# Patient Record
Sex: Female | Born: 1935
Health system: Southern US, Community
[De-identification: ages and names within clinical notes are randomized; demographics above are authoritative.]

## PROBLEM LIST (undated history)

## (undated) DIAGNOSIS — K219 Gastro-esophageal reflux disease without esophagitis: Secondary | ICD-10-CM

## (undated) DIAGNOSIS — I2699 Other pulmonary embolism without acute cor pulmonale: Secondary | ICD-10-CM

## (undated) DIAGNOSIS — F4322 Adjustment disorder with anxiety: Secondary | ICD-10-CM

## (undated) HISTORY — PX: ABDOMINAL HYSTERECTOMY: SHX81

## (undated) HISTORY — PX: APPENDECTOMY: SHX54

---

## 2003-12-23 DIAGNOSIS — I2699 Other pulmonary embolism without acute cor pulmonale: Secondary | ICD-10-CM

## 2003-12-23 HISTORY — DX: Other pulmonary embolism without acute cor pulmonale: I26.99

## 2004-06-30 ENCOUNTER — Inpatient Hospital Stay (HOSPITAL_COMMUNITY): Admission: EM | Admit: 2004-06-30 | Discharge: 2004-07-02 | Payer: Self-pay | Admitting: Emergency Medicine

## 2006-01-26 ENCOUNTER — Ambulatory Visit (HOSPITAL_COMMUNITY): Admission: RE | Admit: 2006-01-26 | Discharge: 2006-01-26 | Payer: Self-pay | Admitting: Family Medicine

## 2007-04-01 ENCOUNTER — Ambulatory Visit (HOSPITAL_COMMUNITY): Admission: RE | Admit: 2007-04-01 | Discharge: 2007-04-01 | Payer: Self-pay

## 2011-01-11 ENCOUNTER — Encounter: Payer: Self-pay | Admitting: Family Medicine

## 2013-01-05 ENCOUNTER — Inpatient Hospital Stay (HOSPITAL_COMMUNITY)
Admission: EM | Admit: 2013-01-05 | Discharge: 2013-01-08 | DRG: 176 | Disposition: A | Discharge status: Home / Self Care | Payer: Medicare Other | Attending: Internal Medicine | Admitting: Internal Medicine

## 2013-01-05 ENCOUNTER — Other Ambulatory Visit: Payer: Self-pay

## 2013-01-05 ENCOUNTER — Encounter (HOSPITAL_COMMUNITY): Payer: Self-pay | Admitting: Emergency Medicine

## 2013-01-05 ENCOUNTER — Ambulatory Visit
Admission: RE | Admit: 2013-01-05 | Discharge: 2013-01-05 | Disposition: A | Discharge status: Home / Self Care | Payer: Medicare Other | Source: Ambulatory Visit

## 2013-01-05 DIAGNOSIS — Z7901 Long term (current) use of anticoagulants: Secondary | ICD-10-CM

## 2013-01-05 DIAGNOSIS — I2692 Saddle embolus of pulmonary artery without acute cor pulmonale: Principal | ICD-10-CM | POA: Diagnosis present

## 2013-01-05 DIAGNOSIS — I2699 Other pulmonary embolism without acute cor pulmonale: Secondary | ICD-10-CM | POA: Diagnosis present

## 2013-01-05 DIAGNOSIS — K219 Gastro-esophageal reflux disease without esophagitis: Secondary | ICD-10-CM | POA: Diagnosis present

## 2013-01-05 DIAGNOSIS — Z882 Allergy status to sulfonamides status: Secondary | ICD-10-CM | POA: Diagnosis not present

## 2013-01-05 DIAGNOSIS — R6889 Other general symptoms and signs: Secondary | ICD-10-CM | POA: Diagnosis not present

## 2013-01-05 DIAGNOSIS — R0602 Shortness of breath: Secondary | ICD-10-CM | POA: Diagnosis not present

## 2013-01-05 DIAGNOSIS — I82409 Acute embolism and thrombosis of unspecified deep veins of unspecified lower extremity: Secondary | ICD-10-CM | POA: Diagnosis present

## 2013-01-05 DIAGNOSIS — R0902 Hypoxemia: Secondary | ICD-10-CM | POA: Diagnosis not present

## 2013-01-05 DIAGNOSIS — Z79899 Other long term (current) drug therapy: Secondary | ICD-10-CM

## 2013-01-05 DIAGNOSIS — Z86711 Personal history of pulmonary embolism: Secondary | ICD-10-CM

## 2013-01-05 DIAGNOSIS — IMO0002 Reserved for concepts with insufficient information to code with codable children: Secondary | ICD-10-CM

## 2013-01-05 DIAGNOSIS — I7401 Saddle embolus of abdominal aorta: Secondary | ICD-10-CM | POA: Diagnosis not present

## 2013-01-05 DIAGNOSIS — I749 Embolism and thrombosis of unspecified artery: Secondary | ICD-10-CM | POA: Diagnosis not present

## 2013-01-05 HISTORY — DX: Gastro-esophageal reflux disease without esophagitis: K21.9

## 2013-01-05 HISTORY — DX: Other pulmonary embolism without acute cor pulmonale: I26.99

## 2013-01-05 LAB — CBC WITH DIFFERENTIAL/PLATELET
Basophils Absolute: 0.1 10*3/uL (ref 0.0–0.1)
Basophils Relative: 1 % (ref 0–1)
Eosinophils Absolute: 0.1 10*3/uL (ref 0.0–0.7)
Eosinophils Relative: 1 % (ref 0–5)
HCT: 42.7 % (ref 36.0–46.0)
MCHC: 34.2 g/dL (ref 30.0–36.0)
MCV: 96.4 fL (ref 78.0–100.0)
Monocytes Absolute: 0.7 10*3/uL (ref 0.1–1.0)
Neutro Abs: 4.6 10*3/uL (ref 1.7–7.7)
RDW: 14.1 % (ref 11.5–15.5)

## 2013-01-05 LAB — BASIC METABOLIC PANEL
BUN: 13 mg/dL (ref 6–23)
Chloride: 102 mEq/L (ref 96–112)
Creatinine, Ser: 0.92 mg/dL (ref 0.50–1.10)
GFR calc Af Amer: 68 mL/min — ABNORMAL LOW (ref >90)

## 2013-01-05 MED ORDER — ONDANSETRON HCL 4 MG/2ML IJ SOLN: 4.0000 mg | Freq: Four times a day (QID) | INTRAMUSCULAR | Status: DC | PRN

## 2013-01-05 MED ORDER — IOHEXOL 300 MG/ML  SOLN
125.0000 mL | Freq: Once | INTRAMUSCULAR | Status: AC | PRN
  Administered 2013-01-05: 125 mL via INTRAVENOUS

## 2013-01-05 MED ORDER — HEPARIN (PORCINE) IN NACL 100-0.45 UNIT/ML-% IJ SOLN
1300.0000 [IU]/h | INTRAMUSCULAR | Status: DC
  Administered 2013-01-05: 1300 [IU]/h via INTRAVENOUS
  Filled 2013-01-05 (×2): qty 250

## 2013-01-05 MED ORDER — HEPARIN BOLUS VIA INFUSION
5000.0000 [IU] | Freq: Once | INTRAVENOUS | Status: AC
  Administered 2013-01-05: 5000 [IU] via INTRAVENOUS

## 2013-01-05 MED ORDER — ONDANSETRON HCL 4 MG PO TABS: 4.0000 mg | ORAL_TABLET | Freq: Four times a day (QID) | ORAL | Status: DC | PRN

## 2013-01-05 MED ORDER — SODIUM CHLORIDE 0.9 % IV SOLN
INTRAVENOUS | Status: AC
  Administered 2013-01-05 – 2013-01-06 (×2): via INTRAVENOUS

## 2013-01-05 MED ORDER — ACETAMINOPHEN 650 MG RE SUPP: 650.0000 mg | Freq: Four times a day (QID) | RECTAL | Status: DC | PRN

## 2013-01-05 MED ORDER — ACETAMINOPHEN 325 MG PO TABS: 650.0000 mg | ORAL_TABLET | Freq: Four times a day (QID) | ORAL | Status: DC | PRN

## 2013-01-05 MED ORDER — PANTOPRAZOLE SODIUM 40 MG PO TBEC
40.0000 mg | DELAYED_RELEASE_TABLET | Freq: Every day | ORAL | Status: DC
  Administered 2013-01-06 – 2013-01-08 (×3): 40 mg via ORAL
  Filled 2013-01-05 (×3): qty 1

## 2013-01-05 MED ORDER — WHITE PETROLATUM GEL
Status: AC
  Administered 2013-01-05
  Filled 2013-01-05: qty 5

## 2013-01-05 MED ORDER — SODIUM CHLORIDE 0.9 % IJ SOLN
3.0000 mL | Freq: Two times a day (BID) | INTRAMUSCULAR | Status: DC
  Administered 2013-01-07: 3 mL via INTRAVENOUS

## 2013-01-05 NOTE — ED Provider Notes (Signed)
History     CSN: [id number]  Arrival date & time 01/05/13  1730   First MD Initiated Contact with Patient 01/05/13 1744      Chief Complaint  Patient presents with  . Shortness of Breath    Patient is a 79 y.o. female presenting with shortness of breath. The history is provided by the patient.  Shortness of Breath  The current episode started 2 days ago. The onset was sudden. The problem occurs continuously. The problem has been unchanged. The problem is moderate. The symptoms are relieved by rest. The symptoms are aggravated by activity. Associated symptoms include shortness of breath. Pertinent negatives include no chest pain and no fever.  pt reports developing SOB over 2 days ago while walking the dog No significant CP No syncope No vomiting/diarrhea No abdominal pain She reports h/o PE in 2005 that required coumadin for 6 months but then resolved No recent travel.   Past Medical History  Diagnosis Date  . GERD (gastroesophageal reflux disease)   . PE (pulmonary embolism) 2005    Past Surgical History  Procedure Date  . Appendectomy   . Abdominal hysterectomy     No family history on file.  History  Substance Use Topics  . Smoking status: Never Smoker   . Smokeless tobacco: Not on file  . Alcohol Use:      Comment: occasionally    OB History    Grav Para Term Preterm Abortions TAB SAB Ect Mult Living                  Review of Systems  Constitutional: Negative for fever.  Respiratory: Positive for shortness of breath.   Cardiovascular: Negative for chest pain.  Gastrointestinal: Negative for vomiting and blood in stool.  Neurological: Negative for weakness.  Psychiatric/Behavioral: Negative for agitation.  All other systems reviewed and are negative.    Allergies  Sulfa antibiotics  Home Medications   Current Outpatient Rx  Name  Route  Sig  Dispense  Refill  . OMEPRAZOLE 20 MG PO CPDR   Oral   Take 20 mg by mouth daily.           BP  144/74  Pulse 110  Temp 97.8 F (36.6 C) (Oral)  Resp 21  Ht 5\' 8"  (1.727 m)  Wt 210 lb (95.255 kg)  BMI 31.93 kg/m2  SpO2 97%  Physical Exam CONSTITUTIONAL: Well developed/well nourished HEAD AND FACE: Normocephalic/atraumatic EYES: EOMI/PERRL ENMT: Mucous membranes moist NECK: supple no meningeal signs CV: S1/S2 noted, no murmurs/rubs/gallops noted LUNGS: Lungs are clear to auscultation bilaterally, no apparent distress ABDOMEN: soft, nontender, no rebound or guarding GU:no cva tenderness NEURO: Pt is awake/alert, moves all extremitiesx4. Pt is watching TV.   EXTREMITIES: pulses normal, full ROM SKIN: warm, color normal PSYCH: no abnormalities of mood noted  ED Course  Procedures   Labs Reviewed  BASIC METABOLIC PANEL - Abnormal; Notable for the following:    Glucose, Bld 136 (*)     GFR calc non Af Amer 59 (*)     GFR calc Af Amer 68 (*)     All other components within normal limits  CBC WITH DIFFERENTIAL  PROTIME-INR  APTT  CBC  HEPARIN LEVEL (UNFRACTIONATED)   Ct Angio Chest Pe W/cm &/or Wo Cm  01/05/2013  **ADDENDUM** CREATED: 01/05/2013 16:56:32  Dr. Vincente Liberty office was contacted and the patient was sent via ambulance to [facility] Emergency Department.  **END ADDENDUM** SIGNED BY: [name]. [name], M.D.  01/05/2013  *RADIOLOGY REPORT*  Clinical Data: Recent onset of shortness of breath and hypoxia, past history of pulmonary emboli  CT ANGIOGRAPHY CHEST  Technique:  Multidetector CT imaging of the chest using the standard protocol during bolus administration of intravenous contrast. Multiplanar reconstructed images including MIPs were obtained and reviewed to evaluate the vascular anatomy.  Contrast: OMNIPAQUE IOHEXOL 300 MG/ML  SOLN  Comparison: CT angio chest of 06/30/2004  BUN and creatinine were obtained on site at [facility] at [address] Results:  BUN 12 mg/dL,  Creatinine 1.1 mg/dL.  Findings: On soft tissue window images, there is a  sizable saddle embolus across the right and left main pulmonary arteries.  There is a large partially occlusive embolus within the right pulmonary artery to the right lower lobe with some involvement of branches to the right middle lobe as well.  On the left there is a smaller partially occlusive embolus to the left lower lobe.  Upper lobe branches are involved on the right with small emboli but no emboli are seen involving the left upper lobe pulmonary artery branches.  The thoracic aorta opacifies with no acute abnormality and the origins of the great vessels are patent.  No mediastinal or hilar adenopathy is seen.  A small hiatal hernia is noted.  On the lung window images, no foci of pulmonary infarction are evident.  Somewhat prominent interstitial markings are present at both lung bases.  No pleural effusion is noted.  No bony abnormality is noted.  IMPRESSION:  1.  Large pulmonary emboli involving both main pulmonary arteries with a saddle embolus.  Partially occlusive emboli noted to both lower lobes right greater than left. 2.  No evidence of pulmonary infarction, with somewhat prominent interstitial markings at the lung bases. 3.  Small hiatal hernia.  This report will be called to the physician by the CT technologist performing this study.   Original Report Authenticated By: Dwyane Dee, M.D.    7:47 PM Pt with PE.  She has saddle emboli, but is stable at this time and not hypoxic.  I did speak to critical care (Dr Herma Carson) and recommend heparin and stepdown care 7:53 PM D/w dr Toniann Fail, will admit to stepdown   MDM  Nursing notes including past medical history and social history reviewed and considered in documentation Labs/vital reviewed and considered        Date: 01/05/2013  Rate: 110  Rhythm: normal sinus rhythm  QRS Axis: normal  Intervals: normal  ST/T Wave abnormalities: nonspecific ST changes  Conduction Disutrbances:none    Joya Gaskins, MD 01/05/13 [phone]

## 2013-01-05 NOTE — ED Notes (Signed)
Pt given [place] sandwich meal and coffee.  Awaiting bed placement.

## 2013-01-05 NOTE — ED Notes (Signed)
Had sudden onset of shortness of breath started Monday when walking dog, transferred here from [facility] with dx Pulmonary Embolisms. States has not been as active as normal, due to weather

## 2013-01-05 NOTE — H&P (Signed)
Stacy Valencia is an 79 y.o. female. Patient was seen and examined on January 05, 2013. PCP - Dr. Kateri Plummer.   Chief Complaint: Shortness of breath. HPI: 79 year-old female with previous history of pulmonary embolism in 2005 after patient had travel to [place] on a flight was referred to the ER by patient's PCP because of patient's complaints of shortness of breath. Patient noticed shortness of breath 2 days ago when she was walking her dog. Her shortness of breath has actually improved over the last 2 days but due to concerns with this symptom she had gone to the PCP who had referred to the ER. In the ER patient was found to have elevated d-dimer and CT angiogram of the chest showed bilateral pulmonary embolism with a saddle embolus. Patient was hemodynamically stable. Critical care was consulted by the ER physician and at this time since patient was hemodynamically stable will be managed in step down unit. Patient denies having had any recent surgery and has not taken any hormone pills. Last month she did travel to [place] in a car.  Past Medical History  Diagnosis Date  . GERD (gastroesophageal reflux disease)   . PE (pulmonary embolism) 2005    Past Surgical History  Procedure Date  . Appendectomy   . Abdominal hysterectomy     Family History  Problem Relation Age of Onset  . Pulmonary embolism Neg Hx    Social History:  reports that she has never smoked. She does not have any smokeless tobacco history on file. She reports that she does not drink alcohol or use illicit drugs.  Allergies:  Allergies  Allergen Reactions  . Sulfa Antibiotics Nausea And Vomiting     (Not in a hospital admission)  Results for orders placed during the hospital encounter of 01/05/13 (from the past 48 hour(s))  BASIC METABOLIC PANEL     Status: Abnormal   Collection Time   01/05/13  6:03 PM      Component Value Range Comment   Sodium 139  135 - 145 mEq/L    Potassium 3.9  3.5 - 5.1 mEq/L    Chloride  102  96 - 112 mEq/L    CO2 24  19 - 32 mEq/L    Glucose, Bld 136 (*) 70 - 99 mg/dL    BUN 13  6 - 23 mg/dL    Creatinine, Ser 9.60  0.50 - 1.10 mg/dL    Calcium 9.9  8.4 - 45.4 mg/dL    GFR calc non Af Amer 59 (*) >90 mL/min    GFR calc Af Amer 68 (*) >90 mL/min   CBC WITH DIFFERENTIAL     Status: Normal   Collection Time   01/05/13  6:03 PM      Component Value Range Comment   WBC 8.9  4.0 - 10.5 K/uL    RBC 4.43  3.87 - 5.11 MIL/uL    Hemoglobin 14.6  12.0 - 15.0 g/dL    HCT 09.8  11.9 - 14.7 %    MCV 96.4  78.0 - 100.0 fL    MCH 33.0  26.0 - 34.0 pg    MCHC 34.2  30.0 - 36.0 g/dL    RDW 82.9  56.2 - 13.0 %    Platelets 184  150 - 400 K/uL    Neutrophils Relative 52  43 - 77 %    Neutro Abs 4.6  1.7 - 7.7 K/uL    Lymphocytes Relative 39  12 - 46 %  Lymphs Abs 3.4  0.7 - 4.0 K/uL    Monocytes Relative 8  3 - 12 %    Monocytes Absolute 0.7  0.1 - 1.0 K/uL    Eosinophils Relative 1  0 - 5 %    Eosinophils Absolute 0.1  0.0 - 0.7 K/uL    Basophils Relative 1  0 - 1 %    Basophils Absolute 0.1  0.0 - 0.1 K/uL   PROTIME-INR     Status: Normal   Collection Time   01/05/13  6:03 PM      Component Value Range Comment   Prothrombin Time 13.3  11.6 - 15.2 seconds    INR 1.02  0.00 - 1.49   APTT     Status: Normal   Collection Time   01/05/13  6:03 PM      Component Value Range Comment   aPTT 30  24 - 37 seconds    Ct Angio Chest Pe W/cm &/or Wo Cm  01/05/2013  **ADDENDUM** CREATED: 01/05/2013 16:56:32  Dr. Vincente Liberty office was contacted and the patient was sent via ambulance to [facility] Emergency Department.  **END ADDENDUM** SIGNED BY: [name]. [name], M.D.   01/05/2013  *RADIOLOGY REPORT*  Clinical Data: Recent onset of shortness of breath and hypoxia, past history of pulmonary emboli  CT ANGIOGRAPHY CHEST  Technique:  Multidetector CT imaging of the chest using the standard protocol during bolus administration of intravenous contrast. Multiplanar reconstructed images including  MIPs were obtained and reviewed to evaluate the vascular anatomy.  Contrast: OMNIPAQUE IOHEXOL 300 MG/ML  SOLN  Comparison: CT angio chest of 06/30/2004  BUN and creatinine were obtained on site at [facility] at [address] Results:  BUN 12 mg/dL,  Creatinine 1.1 mg/dL.  Findings: On soft tissue window images, there is a sizable saddle embolus across the right and left main pulmonary arteries.  There is a large partially occlusive embolus within the right pulmonary artery to the right lower lobe with some involvement of branches to the right middle lobe as well.  On the left there is a smaller partially occlusive embolus to the left lower lobe.  Upper lobe branches are involved on the right with small emboli but no emboli are seen involving the left upper lobe pulmonary artery branches.  The thoracic aorta opacifies with no acute abnormality and the origins of the great vessels are patent.  No mediastinal or hilar adenopathy is seen.  A small hiatal hernia is noted.  On the lung window images, no foci of pulmonary infarction are evident.  Somewhat prominent interstitial markings are present at both lung bases.  No pleural effusion is noted.  No bony abnormality is noted.  IMPRESSION:  1.  Large pulmonary emboli involving both main pulmonary arteries with a saddle embolus.  Partially occlusive emboli noted to both lower lobes right greater than left. 2.  No evidence of pulmonary infarction, with somewhat prominent interstitial markings at the lung bases. 3.  Small hiatal hernia.  This report will be called to the physician by the CT technologist performing this study.   Original Report Authenticated By: Dwyane Dee, M.D.     Review of Systems  Constitutional: Negative.   HENT: Negative.   Eyes: Negative.   Respiratory: Positive for shortness of breath.   Cardiovascular: Negative.   Gastrointestinal: Negative.   Genitourinary: Negative.   Musculoskeletal: Negative.   Skin: Negative.     Neurological: Negative.   Endo/Heme/Allergies: Negative.   Psychiatric/Behavioral:  Negative.     Blood pressure 144/74, pulse 110, temperature 97.8 F (36.6 C), temperature source Oral, resp. rate 21, height 5\' 8"  (1.727 m), weight 95.255 kg (210 lb), SpO2 97.00%. Physical Exam  Constitutional: She is oriented to person, place, and time. She appears well-developed and well-nourished. No distress.  HENT:  Head: Normocephalic and atraumatic.  Right Ear: External ear normal.  Left Ear: External ear normal.  Nose: Nose normal.  Mouth/Throat: Oropharynx is clear and moist. No oropharyngeal exudate.  Eyes: Conjunctivae normal are normal. Pupils are equal, round, and reactive to light. Right eye exhibits no discharge. Left eye exhibits no discharge. No scleral icterus.  Neck: Normal range of motion. Neck supple.  Cardiovascular: Regular rhythm.        Tachycardia.  Respiratory: Effort normal and breath sounds normal. No respiratory distress. She has no wheezes. She has no rales.  GI: Soft. Bowel sounds are normal. She exhibits no distension. There is no tenderness. There is no rebound.  Musculoskeletal: She exhibits no edema and no tenderness.  Neurological: She is alert and oriented to person, place, and time.       Moves all extremities.  Skin: Skin is warm and dry. She is not diaphoretic.     Assessment/Plan #1. Bilateral pulmonary embolism but hemodynamically stable - patient will be monitored in the step down overnight. Patient has been started on heparin infusion. Start oral anticoagulants in a.m. if hemodynamically stable. Patient eventually will need hematology consult as outpatient. Since this is the second episode of unprovoked PE patient probably will need lifelong oral anticoagulants. Check Doppler of the lower extremity. #2. GERD - continue PPI.  CODE STATUS - full code.  Eduard Clos 01/05/2013, 8:47 PM

## 2013-01-05 NOTE — Progress Notes (Signed)
ANTICOAGULATION CONSULT NOTE - Initial Consult  Pharmacy Consult for Heparin Indication: bilateral pulmonary embolus  Allergies  Allergen Reactions  . Sulfa Antibiotics Nausea And Vomiting    Patient Measurements:  Estimated body weight per patient 95.3kg   Estimated ideal body weight 63.9 kg Estimated Heparin Dosing Weight: 80kg  Vital Signs: Temp: 97.8 F (36.6 C) (01/15 1739) Temp src: Oral (01/15 1739) BP: 144/74 mmHg (01/15 1739) Pulse Rate: 110  (01/15 1739)  Labs: No results found for this basename: HGB:2,HCT:3,PLT:3,APTT:3,LABPROT:3,INR:3,HEPARINUNFRC:3,CREATININE:3,CKTOTAL:3,CKMB:3,TROPONINI:3 in the last 72 hours  CrCl is unknown because no creatinine reading has been taken and the patient has no height on file.  Medical History: Past Medical History  Diagnosis Date  . GERD (gastroesophageal reflux disease)   . PE (pulmonary embolism) 2005    Medications:  No anticoagulation PTA   Assessment: Miss Colville is a 79 year old female to start heparin per pharmacy for new bilateral PE. Her CBC is within normal limits as well as her platelet count at 184.   Goal of Therapy:  Heparin level 0.3-0.7 units/ml Monitor platelets by anticoagulation protocol: Yes   Plan:  Heparin bolus of 5000 units  Then begin heparin drip at 1300 units/hr  Obtain 8 hour HL, timed collect  Daily HL and CBC  Thank you,  Brett Fairy, PharmD, BCPS 01/05/2013 6:52 PM

## 2013-01-05 NOTE — ED Notes (Signed)
Pt brought to ED via [facility] from [facility], pt was seen there for evaluation of shortness of breath she developed over the past weekend, they found pt to have bilateral Pulmonary Embolus and called EMS for transport to ED.  Pt denies any pain or shortness of breath at this time.  Placed on cardiac monitor upon arrival to ED.

## 2013-01-06 ENCOUNTER — Telehealth: Payer: Self-pay | Admitting: Oncology

## 2013-01-06 DIAGNOSIS — I2699 Other pulmonary embolism without acute cor pulmonale: Secondary | ICD-10-CM

## 2013-01-06 DIAGNOSIS — I749 Embolism and thrombosis of unspecified artery: Secondary | ICD-10-CM

## 2013-01-06 LAB — BASIC METABOLIC PANEL
CO2: 24 mEq/L (ref 19–32)
Calcium: 9.4 mg/dL (ref 8.4–10.5)
GFR calc Af Amer: 61 mL/min — ABNORMAL LOW (ref >90)
GFR calc non Af Amer: 53 mL/min — ABNORMAL LOW (ref >90)
Sodium: 139 mEq/L (ref 135–145)

## 2013-01-06 LAB — MRSA PCR SCREENING: MRSA by PCR: NEGATIVE

## 2013-01-06 LAB — CBC
Hemoglobin: 14.1 g/dL (ref 12.0–15.0)
MCH: 33.4 pg (ref 26.0–34.0)
MCHC: 34.3 g/dL (ref 30.0–36.0)
MCV: 97.4 fL (ref 78.0–100.0)
RBC: 4.22 MIL/uL (ref 3.87–5.11)

## 2013-01-06 LAB — HEPARIN LEVEL (UNFRACTIONATED)
Heparin Unfractionated: 1.28 IU/mL — ABNORMAL HIGH (ref 0.30–0.70)
Heparin Unfractionated: 0.66 IU/mL (ref 0.30–0.70)

## 2013-01-06 LAB — TROPONIN I: Troponin I: <0.3 ng/mL (ref <0.30)

## 2013-01-06 MED ORDER — WARFARIN SODIUM 5 MG PO TABS
5.0000 mg | ORAL_TABLET | Freq: Once | ORAL | Status: DC
  Filled 2013-01-06: qty 1

## 2013-01-06 MED ORDER — WARFARIN - PHARMACIST DOSING INPATIENT: Freq: Every day | Status: DC

## 2013-01-06 MED ORDER — HEPARIN (PORCINE) IN NACL 100-0.45 UNIT/ML-% IJ SOLN
1100.0000 [IU]/h | INTRAMUSCULAR | Status: DC
  Administered 2013-01-06: 1100 [IU]/h via INTRAVENOUS
  Filled 2013-01-06 (×2): qty 250

## 2013-01-06 NOTE — Progress Notes (Addendum)
Patient ID: Stacy Valencia  female  WUJ:[id number]    DOB: 03-29-1933    DOA: 01/05/2013  PCP: No primary provider on file.  Addendum: Verified with Dr Burna Forts, continue heparin gtt and will start Xarelto tomorrow. She is approved per [employer] fund[employer].   [name] M.D. [facility] 01/06/2013, 2:43 PM  Pager: [phone]     Assessment/Plan: Principal Problem:  *Pulmonary embolism bilateral with saddle embolism: Second episode. First episode in 2005 after a long flight to [place], patient had taken her drive to [place] during Thanksgiving last year. She is currently hemodynamically stable - Continue heparin drip, I started the patient on Coumadin. - Awaiting case management for insurance approval for xarelto - I discussed in detail with [name] from hematology, agreed with the xarelto 15mg  BID x3 weeks -> 20mg  daily possibly indefinitely or per hematology recommendations outpatient. She will follow up with [name] in 2-3 weeks after the discharge. Per hematology rec's, continue heparin drip for 24-48 hours, start xarelto. Patient can be safely dc'ed if stable and after heparin for 24-48hrs and on xarelto.  - I did recommend the patient for 2-D echocardiogram given massive pulmonary embolism bilateral with saddle embolism to rule out any right heart strain on right heart failure, patient adamantly refused to have the 2-D echocardiogram stating that she feels "her heart is fine". She agreed to have Dopplers for LE's.  Active Problems:  GERD (gastroesophageal reflux disease): Continue PPI  DVT Prophylaxis: On full dose anticoagulation  Code Status: Full code  Disposition: In 1-2 days    Subjective: Patient sitting in the chair, smiling, no chest pain or any worsening shortness of breath. No nausea vomiting or fevers  Objective: Weight change:   Intake/Output Summary (Last 24 hours) at 01/06/13 1252 Last data filed at 01/06/13 0500  Gross per 24 hour  Intake 652.67 ml  Output     400 ml  Net 252.67 ml   Blood pressure 131/73, pulse 95, temperature 98.5 F (36.9 C), temperature source Oral, resp. rate 19, height 5\' 8"  (1.727 m), weight 95.255 kg (210 lb), SpO2 93.00%.  Physical Exam: General: Alert and awake, oriented x3, not in any acute distress. HEENT: anicteric sclera, pupils reactive to light and accommodation, EOMI CVS: S1-S2 clear, no murmur rubs or gallops Chest: clear to auscultation bilaterally, no wheezing, rales or rhonchi Abdomen: soft nontender, nondistended, normal bowel sounds, no organomegaly Extremities: no cyanosis, clubbing or edema noted bilaterally Neuro: Cranial nerves II-XII intact, no focal neurological deficits  Lab Results: Basic Metabolic Panel:  Lab 01/06/13 8657 01/05/13 1803  NA 139 139  K 3.7 3.9  CL 104 102  CO2 24 24  GLUCOSE 116* 136*  BUN 12 13  CREATININE 1.00 0.92  CALCIUM 9.4 9.9  MG -- --  PHOS -- --   CBC:  Lab 01/06/13 0201 01/05/13 1803  WBC 8.9 8.9  NEUTROABS -- 4.6  HGB 14.1 14.6  HCT 41.1 42.7  MCV 97.4 96.4  PLT 174 184   Cardiac Enzymes:  Lab 01/05/13 2351  CKTOTAL --  CKMB --  CKMBINDEX --  TROPONINI <0.30   BNP: No components found with this basename: POCBNP:2 CBG: No results found for this basename: GLUCAP:5 in the last 168 hours   Micro Results: Recent Results (from the past 240 hour(s))  MRSA PCR SCREENING     Status: Normal   Collection Time   01/05/13 11:28 PM      Component Value Range Status Comment   MRSA by PCR NEGATIVE  NEGATIVE Final     Studies/Results: Ct Angio Chest Pe W/cm &/or Wo Cm  01/05/2013  **ADDENDUM** CREATED: 01/05/2013 16:56:32  Dr. Vincente Liberty office was contacted and the patient was sent via ambulance to [facility] Emergency Department.  **END ADDENDUM** SIGNED BY: [name]. [name], M.D.   01/05/2013  *RADIOLOGY REPORT*  Clinical Data: Recent onset of shortness of breath and hypoxia, past history of pulmonary emboli  CT ANGIOGRAPHY CHEST  Technique:   Multidetector CT imaging of the chest using the standard protocol during bolus administration of intravenous contrast. Multiplanar reconstructed images including MIPs were obtained and reviewed to evaluate the vascular anatomy.  Contrast: OMNIPAQUE IOHEXOL 300 MG/ML  SOLN  Comparison: CT angio chest of 06/30/2004  BUN and creatinine were obtained on site at [facility] at [address] Results:  BUN 12 mg/dL,  Creatinine 1.1 mg/dL.  Findings: On soft tissue window images, there is a sizable saddle embolus across the right and left main pulmonary arteries.  There is a large partially occlusive embolus within the right pulmonary artery to the right lower lobe with some involvement of branches to the right middle lobe as well.  On the left there is a smaller partially occlusive embolus to the left lower lobe.  Upper lobe branches are involved on the right with small emboli but no emboli are seen involving the left upper lobe pulmonary artery branches.  The thoracic aorta opacifies with no acute abnormality and the origins of the great vessels are patent.  No mediastinal or hilar adenopathy is seen.  A small hiatal hernia is noted.  On the lung window images, no foci of pulmonary infarction are evident.  Somewhat prominent interstitial markings are present at both lung bases.  No pleural effusion is noted.  No bony abnormality is noted.  IMPRESSION:  1.  Large pulmonary emboli involving both main pulmonary arteries with a saddle embolus.  Partially occlusive emboli noted to both lower lobes right greater than left. 2.  No evidence of pulmonary infarction, with somewhat prominent interstitial markings at the lung bases. 3.  Small hiatal hernia.  This report will be called to the physician by the CT technologist performing this study.   Original Report Authenticated By: Dwyane Dee, M.D.     Medications: Scheduled Meds:   . pantoprazole  40 mg Oral Daily  . sodium chloride  3 mL Intravenous Q12H    . warfarin  5 mg Oral ONCE-1800  . Warfarin - Pharmacist Dosing Inpatient   Does not apply q1800      LOS: 1 day   Roseanne Juenger M.D. [facility] 01/06/2013, 12:52 PM Pager: [phone]  If 7PM-7AM, please contact night-coverage [email or web address] Password [email or web address]

## 2013-01-06 NOTE — Progress Notes (Signed)
Patient refuse to have 2D echo done states"nothing is wrong with my heart" I try to explain the reason it was order however she continue to refuse. Dr. Isidoro Donning notified MD states she also explained this morning to patient.

## 2013-01-06 NOTE — Telephone Encounter (Signed)
C/D 01/06/13 for appt 01/27/13

## 2013-01-06 NOTE — Progress Notes (Signed)
VASCULAR LAB PRELIMINARY  PRELIMINARY  PRELIMINARY  PRELIMINARY  Bilateral lower extremity venous duplex completed.    Preliminary report:  Positive for a DVT in the popliteal vein mid to proximal. No evidence of superficial thrombus. Patient has had multiple veins stripped in the past also with injections. No evidence of a Baker's cyst. Left:  No evidence of DVT, superficial thrombosis, or Baker's cyst.  Taevion Sikora, RVS 01/06/2013, 1:10 PM

## 2013-01-06 NOTE — Progress Notes (Signed)
01/06/2013 1030.Marland KitchenMarland KitchenCM referral noted for medication assistance for Xarelto. Benefits check completed...$24.00 copay at [facility] and [facility]; no authorization required. Carlene, RN aware.  NCM will continue to follow pt for future needs. Oletta Cohn, RN, BSN, [place] [phone].

## 2013-01-06 NOTE — Progress Notes (Signed)
ANTICOAGULATION CONSULT NOTE - Follow Up Consult  Pharmacy Consult for heparin Indication: bilateral PE  Allergies  Allergen Reactions  . Sulfa Antibiotics Nausea And Vomiting    Patient Measurements: Height: 5\' 8"  (172.7 cm) Weight: 210 lb (95.255 kg) IBW/kg (Calculated) : 63.9  Heparin Dosing Weight: 80 kg  Vital Signs: Temp: 97.6 F (36.4 C) (01/16 2000) Temp src: Oral (01/16 2000) BP: 145/79 mmHg (01/16 2000) Pulse Rate: 103  (01/16 2000)  Labs:  Basename 01/06/13 2201 01/06/13 1145 01/06/13 0201 01/05/13 2351 01/05/13 1803  HGB -- -- 14.1 -- 14.6  HCT -- -- 41.1 -- 42.7  PLT -- -- 174 -- 184  APTT -- -- -- -- 30  LABPROT -- -- -- -- 13.3  INR -- -- -- -- 1.02  HEPARINUNFRC 0.66 1.28* 0.66 -- --  CREATININE -- -- 1.00 -- 0.92  CKTOTAL -- -- -- -- --  CKMB -- -- -- -- --  TROPONINI -- -- -- <0.30 --    Estimated Creatinine Clearance: 56.9 ml/min (by C-G formula based on Cr of 1).   Medications:  Scheduled:     . pantoprazole  40 mg Oral Daily  . sodium chloride  3 mL Intravenous Q12H  . [COMPLETED] white petrolatum      . [DISCONTINUED] warfarin  5 mg Oral ONCE-1800  . [DISCONTINUED] Warfarin - Pharmacist Dosing Inpatient   Does not apply q1800   Infusions:     . sodium chloride 100 mL/hr at 01/06/13 0928  . heparin 1,100 Units/hr (01/06/13 2200)  . [DISCONTINUED] heparin 1,300 Units/hr (01/06/13 0500)    Assessment: 79 yo female with bilateral PE is currently on supratherapeutic hepairn.  Heparin level (0.66) is at-goal on 1100 units/hr.  Plan to d/c heparin and start Xarelto tomorrow at ~17:00.   Goal of Therapy:  Heparin level 0.3-0.7 units/ml Monitor platelets by anticoagulation protocol: Yes   Plan:  1) Continue IV heparin at 1100 units/hr 2) Daily CBC, heparin level 3) Follow-up heparin --> Xarelto change.  Emeline Gins 01/06/2013,10:55 PM

## 2013-01-06 NOTE — Telephone Encounter (Signed)
S/W pt in re  NP appt 02/06 @ 10:30 w/[name].  Referring Dr. Isidoro Donning Dx-PE Welcome packet mailed.

## 2013-01-06 NOTE — Progress Notes (Signed)
ANTICOAGULATION CONSULT NOTE Pharmacy Consult for Heparin Indication: bilateral pulmonary embolus  Allergies  Allergen Reactions  . Sulfa Antibiotics Nausea And Vomiting   Vital Signs: Temp: 97.8 F (36.6 C) (01/15 2305) Temp src: Oral (01/15 2305) BP: 116/72 mmHg (01/16 0200) Pulse Rate: 97  (01/16 0300)  Labs:  Basename 01/06/13 0201 01/05/13 2351 01/05/13 1803  HGB 14.1 -- 14.6  HCT 41.1 -- 42.7  PLT 174 -- 184  APTT -- -- 30  LABPROT -- -- 13.3  INR -- -- 1.02  HEPARINUNFRC 0.66 -- --  CREATININE 1.00 -- 0.92  CKTOTAL -- -- --  CKMB -- -- --  TROPONINI -- <0.30 --    Estimated Creatinine Clearance: 56.9 ml/min (by C-G formula based on Cr of 1).  Assessment: 79 year old female with PE for heparin  Goal of Therapy:  Heparin level 0.3-0.7 units/ml Monitor platelets by anticoagulation protocol: Yes   Plan:  Continue Heparin at current rate Recheck level in 8 hr to verify stays within therapeutic range  Eddie Candle   01/06/2013 3:25 AM

## 2013-01-06 NOTE — Progress Notes (Signed)
ANTICOAGULATION CONSULT NOTE - Follow Up Consult  Pharmacy Consult for heparin Indication: bilateral PE  Allergies  Allergen Reactions  . Sulfa Antibiotics Nausea And Vomiting    Patient Measurements: Height: 5\' 8"  (172.7 cm) Weight: 210 lb (95.255 kg) IBW/kg (Calculated) : 63.9  Heparin Dosing Weight: 80 kg  Vital Signs: Temp: 98.5 F (36.9 C) (01/16 0840) Temp src: Oral (01/16 0840) BP: 131/73 mmHg (01/16 0840) Pulse Rate: 95  (01/16 0840)  Labs:  Basename 01/06/13 1145 01/06/13 0201 01/05/13 2351 01/05/13 1803  HGB -- 14.1 -- 14.6  HCT -- 41.1 -- 42.7  PLT -- 174 -- 184  APTT -- -- -- 30  LABPROT -- -- -- 13.3  INR -- -- -- 1.02  HEPARINUNFRC 1.28* 0.66 -- --  CREATININE -- 1.00 -- 0.92  CKTOTAL -- -- -- --  CKMB -- -- -- --  TROPONINI -- -- <0.30 --    Estimated Creatinine Clearance: 56.9 ml/min (by C-G formula based on Cr of 1).   Medications:  Scheduled:    . [COMPLETED] heparin  5,000 Units Intravenous Once  . pantoprazole  40 mg Oral Daily  . sodium chloride  3 mL Intravenous Q12H  . warfarin  5 mg Oral ONCE-1800  . Warfarin - Pharmacist Dosing Inpatient   Does not apply q1800  . [COMPLETED] white petrolatum       Infusions:    . sodium chloride 100 mL/hr at 01/06/13 0928  . heparin 1,300 Units/hr (01/06/13 0500)    Assessment: 79 yo female with bilateral PE is currently on supratherapeutic hepairn.  Heparin level was 1.28. Goal of Therapy:  Heparin level 0.3-0.7 units/ml Monitor platelets by anticoagulation protocol: Yes   Plan:  1) Hold heparin x 1 hr, then reduce heparin to 1100 units/hr 2) Check an 8hr heparin level after rate is changed   Adriauna Campton, Tsz-Yin 01/06/2013,12:39 PM

## 2013-01-06 NOTE — Progress Notes (Deleted)
ANTICOAGULATION CONSULT NOTE - Initial Consult  Pharmacy Consult for coumadin Indication: new PE  Allergies  Allergen Reactions  . Sulfa Antibiotics Nausea And Vomiting    Patient Measurements: Height: 5\' 8"  (172.7 cm) Weight: 210 lb (95.255 kg) IBW/kg (Calculated) : 63.9  Heparin Dosing Weight:   Vital Signs: Temp: 98.6 F (37 C) (01/16 0549) Temp src: Oral (01/16 0549) BP: 122/74 mmHg (01/16 0549) Pulse Rate: 102  (01/16 0549)  Labs:  Basename 01/06/13 0201 01/05/13 2351 01/05/13 1803  HGB 14.1 -- 14.6  HCT 41.1 -- 42.7  PLT 174 -- 184  APTT -- -- 30  LABPROT -- -- 13.3  INR -- -- 1.02  HEPARINUNFRC 0.66 -- --  CREATININE 1.00 -- 0.92  CKTOTAL -- -- --  CKMB -- -- --  TROPONINI -- <0.30 --    Estimated Creatinine Clearance: 56.9 ml/min (by C-G formula based on Cr of 1).   Medical History: Past Medical History  Diagnosis Date  . GERD (gastroesophageal reflux disease)   . PE (pulmonary embolism) 2005    Medications:  Scheduled:    . [COMPLETED] heparin  5,000 Units Intravenous Once  . pantoprazole  40 mg Oral Daily  . sodium chloride  3 mL Intravenous Q12H  . [COMPLETED] white petrolatum       Infusions:    . sodium chloride 100 mL/hr at 01/06/13 0500  . heparin 1,300 Units/hr (01/06/13 0500)    Assessment: 79 yo female with new bilateral PE will be started on coumadin in addition to heparin which she is already on.  Baseline INR was 1.02.  Coumadin score was 4. Day 1 of 5 of VTE overlap.  Goal of Therapy:  INR 2-3     Plan:  1) Coumadin 5mg  po x1 2) Daily PT/INR  Stacy Valencia, Tsz-Yin 01/06/2013,8:46 AM

## 2013-01-06 NOTE — Progress Notes (Signed)
Utilization Review Completed [facility] J. Mumin Denomme, RN, BSN, NCM [phone]  

## 2013-01-06 NOTE — Progress Notes (Signed)
ANTICOAGULATION CONSULT NOTE - Initial Consult  Pharmacy Consult for xarelto Indication: pulmonary embolus  Allergies  Allergen Reactions  . Sulfa Antibiotics Nausea And Vomiting    Patient Measurements: Height: 5\' 8"  (172.7 cm) Weight: 210 lb (95.255 kg) IBW/kg (Calculated) : 63.9  Heparin Dosing Weight:   Vital Signs: Temp: 98.5 F (36.9 C) (01/16 0840) Temp src: Oral (01/16 0840) BP: 131/73 mmHg (01/16 0840) Pulse Rate: 95  (01/16 0840)  Labs:  Basename 01/06/13 1145 01/06/13 0201 01/05/13 2351 01/05/13 1803  HGB -- 14.1 -- 14.6  HCT -- 41.1 -- 42.7  PLT -- 174 -- 184  APTT -- -- -- 30  LABPROT -- -- -- 13.3  INR -- -- -- 1.02  HEPARINUNFRC 1.28* 0.66 -- --  CREATININE -- 1.00 -- 0.92  CKTOTAL -- -- -- --  CKMB -- -- -- --  TROPONINI -- -- <0.30 --    Estimated Creatinine Clearance: 56.9 ml/min (by C-G formula based on Cr of 1).   Medical History: Past Medical History  Diagnosis Date  . GERD (gastroesophageal reflux disease)   . PE (pulmonary embolism) 2005    Medications:  Scheduled:    . [COMPLETED] heparin  5,000 Units Intravenous Once  . pantoprazole  40 mg Oral Daily  . sodium chloride  3 mL Intravenous Q12H  . [COMPLETED] white petrolatum      . [DISCONTINUED] warfarin  5 mg Oral ONCE-1800  . [DISCONTINUED] Warfarin - Pharmacist Dosing Inpatient   Does not apply q1800   Infusions:    . sodium chloride 100 mL/hr at 01/06/13 0928  . heparin    . [DISCONTINUED] heparin 1,300 Units/hr (01/06/13 0500)    Assessment: 55 female with bilateral PE will be continued on heparin until ~1700 on 01/17 and be converted to Xarelto alone.  CrCl ~57 Goal of Therapy:   Monitor platelets by anticoagulation protocol: Yes   Plan:  1) At 1700 tom, d/c heparin and start Xarelto 15mg  po bid x 3 weeks, then 20mg  po qday thereafter (will enter order on 01/17) 2) Monitor renal function and CBC  Ihsan Nomura, Tsz-Yin 01/06/2013,1:56 PM

## 2013-01-07 DIAGNOSIS — I82409 Acute embolism and thrombosis of unspecified deep veins of unspecified lower extremity: Secondary | ICD-10-CM | POA: Diagnosis present

## 2013-01-07 HISTORY — DX: Acute embolism and thrombosis of unspecified deep veins of unspecified lower extremity: I82.409

## 2013-01-07 LAB — CBC
Hemoglobin: 12.7 g/dL (ref 12.0–15.0)
MCHC: 33 g/dL (ref 30.0–36.0)
RBC: 3.95 MIL/uL (ref 3.87–5.11)

## 2013-01-07 LAB — HEPARIN LEVEL (UNFRACTIONATED): Heparin Unfractionated: 0.65 IU/mL (ref 0.30–0.70)

## 2013-01-07 MED ORDER — RIVAROXABAN 15 MG PO TABS
15.0000 mg | ORAL_TABLET | Freq: Two times a day (BID) | ORAL | Status: DC
  Administered 2013-01-07 – 2013-01-08 (×2): 15 mg via ORAL
  Filled 2013-01-07 (×4): qty 1

## 2013-01-07 MED ORDER — RIVAROXABAN 15 MG PO TABS
15.0000 mg | ORAL_TABLET | Freq: Two times a day (BID) | ORAL | Status: DC
  Filled 2013-01-07 (×2): qty 1

## 2013-01-07 MED ORDER — INFLUENZA VIRUS VACC SPLIT PF IM SUSP
0.5000 mL | INTRAMUSCULAR | Status: AC
  Administered 2013-01-08: 0.5 mL via INTRAMUSCULAR
  Filled 2013-01-07: qty 0.5

## 2013-01-07 MED ORDER — HEPARIN (PORCINE) IN NACL 100-0.45 UNIT/ML-% IJ SOLN
1100.0000 [IU]/h | INTRAMUSCULAR | Status: AC
  Filled 2013-01-07: qty 250

## 2013-01-07 NOTE — Progress Notes (Signed)
ANTICOAGULATION CONSULT NOTE - Follow Up Consult  Pharmacy Consult for heparin until 1700 on 01/17 and xarelto starting at 1700 on 01/17 Indication: pulmonary embolus  Allergies  Allergen Reactions  . Sulfa Antibiotics Nausea And Vomiting    Patient Measurements: Height: 5\' 8"  (172.7 cm) Weight: 217 lb 9.5 oz (98.7 kg) IBW/kg (Calculated) : 63.9  Heparin Dosing Weight:   Vital Signs: Temp: 97.7 F (36.5 C) (01/17 1127) Temp src: Oral (01/17 1127) BP: 136/68 mmHg (01/17 1127) Pulse Rate: 88  (01/17 1127)  Labs:  Basename 01/07/13 0500 01/06/13 2201 01/06/13 1145 01/06/13 0201 01/05/13 2351 01/05/13 1803  HGB 12.7 -- -- 14.1 -- --  HCT 38.5 -- -- 41.1 -- 42.7  PLT 166 -- -- 174 -- 184  APTT -- -- -- -- -- 30  LABPROT -- -- -- -- -- 13.3  INR -- -- -- -- -- 1.02  HEPARINUNFRC 0.65 0.66 1.28* -- -- --  CREATININE -- -- -- 1.00 -- 0.92  CKTOTAL -- -- -- -- -- --  CKMB -- -- -- -- -- --  TROPONINI -- -- -- -- <0.30 --    Estimated Creatinine Clearance: 57.9 ml/min (by C-G formula based on Cr of 1).   Medications:  Scheduled:    . pantoprazole  40 mg Oral Daily  . rivaroxaban  15 mg Oral BID WC  . sodium chloride  3 mL Intravenous Q12H  . [DISCONTINUED] warfarin  5 mg Oral ONCE-1800  . [DISCONTINUED] Warfarin - Pharmacist Dosing Inpatient   Does not apply q1800   Infusions:    . [EXPIRED] sodium chloride 100 mL/hr at 01/06/13 0928  . heparin    . [DISCONTINUED] heparin 1,300 Units/hr (01/06/13 0500)  . [DISCONTINUED] heparin 1,100 Units/hr (01/07/13 0700)    Assessment: 79 yo female with new bilateral PE is currently on therapeutic heparin.  Heparin level today was 0.65.  H/H 12.7/38.5 and Plt 166K.  Will be transitioning to xarelto alone at 1700 today.  CrCl ~58. Goal of Therapy:  Heparin level 0.3-0.7 units/ml Monitor platelets by anticoagulation protocol: Yes   Plan:  1) Continue heparin at 1100 units/hr.  Stop drip at 1700 today. 2) At 1700 today, start  Xarelto 15mg  po bid x 3 weeks, then 20mg  po qday 3) D/c daily heparin levels  Tailyn Hantz, Tsz-Yin 01/07/2013,11:36 AM

## 2013-01-07 NOTE — Progress Notes (Signed)
Patient ID: Stacy Valencia  female  ZOX:[id number]    DOB: [birth year]    DOA: 01/05/2013  PCP: No primary provider on file.   Assessment/Plan: Principal Problem:  *Pulmonary embolism bilateral with saddle embolism: Second episode. First episode in 2005 after a long flight to [place], patient had taken her drive to [place] during Thanksgiving last year. She is currently hemodynamically stable - Continue heparin drip today until xarelto started in the evening. Insurance approved for [employer] - I discussed in detail with [name] from hematology, appt made for 01/27/13 for f/u - I did recommend the patient for 2-D echocardiogram yesterday given massive pulmonary embolism bilateral with saddle embolism to rule out any right heart strain on right heart failure, patient adamantly refused to have the 2-D echocardiogram stating that she feels "her heart is fine". She refused today as well.  Acute [id number] as #1  Active Problems:  GERD (gastroesophageal reflux disease): Continue PPI  DVT Prophylaxis: On full dose anticoagulation  Code Status: Full code  Disposition: tomorrow AM    Subjective: Patient resting in bed, no complaints, no chest pain or any worsening shortness of breath. No nausea vomiting or fevers  Objective: Weight change: 3.445 kg (7 lb 9.5 oz)  Intake/Output Summary (Last 24 hours) at 01/07/13 1300 Last data filed at 01/07/13 1005  Gross per 24 hour  Intake    300 ml  Output   1950 ml  Net  -1650 ml   Blood pressure 136/68, pulse 88, temperature 97.7 F (36.5 C), temperature source Oral, resp. rate 21, height 5\' 8"  (1.727 m), weight 98.7 kg (217 lb 9.5 oz), SpO2 94.00%.  Physical Exam: General: Alert and awake, oriented x3, not in any acute distress. CVS: S1-S2 clear, no murmur rubs or gallops Chest: CTAB Abdomen: soft NT, NBS Extremities: no cyanosis, clubbing or edema noted bilaterally   Lab Results: Basic Metabolic Panel:  Lab 01/06/13 1308 01/05/13  1803  NA 139 139  K 3.7 3.9  CL 104 102  CO2 24 24  GLUCOSE 116* 136*  BUN 12 13  CREATININE 1.00 0.92  CALCIUM 9.4 9.9  MG -- --  PHOS -- --   CBC:  Lab 01/07/13 0500 01/06/13 0201 01/05/13 1803  WBC 7.5 8.9 --  NEUTROABS -- -- 4.6  HGB 12.7 14.1 --  HCT 38.5 41.1 --  MCV 97.5 97.4 --  PLT 166 174 --   Cardiac Enzymes:  Lab 01/05/13 2351  CKTOTAL --  CKMB --  CKMBINDEX --  TROPONINI <0.30     Micro Results: Recent Results (from the past 240 hour(s))  MRSA PCR SCREENING     Status: Normal   Collection Time   01/05/13 11:28 PM      Component Value Range Status Comment   MRSA by PCR NEGATIVE  NEGATIVE Final     Studies/Results: Ct Angio Chest Pe W/cm &/or Wo Cm  01/05/2013  **ADDENDUM** CREATED: 01/05/2013 16:56:32  Dr. Vincente [place] office was contacted and the patient was sent via ambulance to [facility].  **END ADDENDUM** SIGNED BY: [name]. [name], M.D.   01/05/2013  *RADIOLOGY REPORT*  Clinical Data: Recent onset of shortness of breath and hypoxia, past history of pulmonary emboli  CT ANGIOGRAPHY CHEST  Technique:  Multidetector CT imaging of the chest using the standard protocol during bolus administration of intravenous contrast. Multiplanar reconstructed images including MIPs were obtained and reviewed to evaluate the vascular anatomy.  Contrast: OMNIPAQUE IOHEXOL 300 MG/ML  SOLN  Comparison:  CT angio chest of 06/30/2004  BUN and creatinine were obtained on site at [facility] at [address] Results:  BUN 12 mg/dL,  Creatinine 1.1 mg/dL.  Findings: On soft tissue window images, there is a sizable saddle embolus across the right and left main pulmonary arteries.  There is a large partially occlusive embolus within the right pulmonary artery to the right lower lobe with some involvement of branches to the right middle lobe as well.  On the left there is a smaller partially occlusive embolus to the left lower lobe.  Upper lobe branches  are involved on the right with small emboli but no emboli are seen involving the left upper lobe pulmonary artery branches.  The thoracic aorta opacifies with no acute abnormality and the origins of the great vessels are patent.  No mediastinal or hilar adenopathy is seen.  A small hiatal hernia is noted.  On the lung window images, no foci of pulmonary infarction are evident.  Somewhat prominent interstitial markings are present at both lung bases.  No pleural effusion is noted.  No bony abnormality is noted.  IMPRESSION:  1.  Large pulmonary emboli involving both main pulmonary arteries with a saddle embolus.  Partially occlusive emboli noted to both lower lobes right greater than left. 2.  No evidence of pulmonary infarction, with somewhat prominent interstitial markings at the lung bases. 3.  Small hiatal hernia.  This report will be called to the physician by the CT technologist performing this study.   Original Report Authenticated By: Dwyane Dee, M.D.     Medications: Scheduled Meds:    . pantoprazole  40 mg Oral Daily  . rivaroxaban  15 mg Oral BID WC  . sodium chloride  3 mL Intravenous Q12H      LOS: 2 days   Marque Rademaker M.D. [facility] 01/07/2013, 1:00 PM Pager: [phone]  If 7PM-7AM, please contact night-coverage [email or web address] Password [email or web address]

## 2013-01-08 LAB — CBC
MCV: 98.8 fL (ref 78.0–100.0)
Platelets: 184 10*3/uL (ref 150–400)
RBC: 4.07 MIL/uL (ref 3.87–5.11)
WBC: 7.1 10*3/uL (ref 4.0–10.5)

## 2013-01-08 MED ORDER — RIVAROXABAN 20 MG PO TABS: 20.0000 mg | ORAL_TABLET | Freq: Every day | ORAL | Status: DC

## 2013-01-08 MED ORDER — RIVAROXABAN 15 MG PO TABS: 15.0000 mg | ORAL_TABLET | Freq: Two times a day (BID) | ORAL | Status: DC

## 2013-01-08 NOTE — Progress Notes (Signed)
01/08/2013 1100 [facility] contacted for oxygen. Isidoro Donning RN CCM Case Mgmt phone [phone]

## 2013-01-08 NOTE — Progress Notes (Signed)
Pt drops to 88% on RA when sleeping, resp regular, no apnea noted.  Placed on 2L O2 per , back up to 97%.

## 2013-01-08 NOTE — Discharge Summary (Signed)
Physician Discharge Summary  Patient ID: Stacy Valencia MRN: [id number] DOB/AGE: [birth year] 79 y.o.  Admit date: 01/05/2013 Discharge date: 01/08/2013  Primary Care Physician:  No primary provider on file.  Discharge Diagnoses:     . bilateral Pulmonary embolism with saddle embolus  . GERD (gastroesophageal reflux disease) . DVT (deep venous thrombosis)  Consults: Dr Burna Forts (phone consultation)   Discharge Medications:   Medication List     As of 01/08/2013 10:01 AM    TAKE these medications         omeprazole 20 MG capsule   Commonly known as: PRILOSEC   Take 20 mg by mouth daily.      Rivaroxaban 15 MG Tabs tablet   Commonly known as: XARELTO   Take 1 tablet (15 mg total) by mouth 2 (two) times daily with a meal. X 21 days, then start 20mg  daily      Rivaroxaban 20 MG Tabs   Commonly known as: XARELTO   Take 1 tablet (20 mg total) by mouth daily. After 15mg  2times/daily for 21 days is completed, then start 20mg  dose         Brief H and P: For complete details please refer to admission H and P, but in brief 79 year-old female with previous history of pulmonary embolism in 2005 after patient had travel to [place] on a flight was referred to the ER by patient's PCP because of patient's complaints of shortness of breath. Patient noticed shortness of breath 2 days ago when she was walking her dog. Her shortness of breath had actually improved over the last 2 days but due to concerns with this symptom she had gone to the PCP who had referred to the ER. In the ER patient was found to have elevated d-dimer and CT angiogram of the chest showed bilateral pulmonary embolism with a saddle embolus. Patient was hemodynamically stable. Critical care was consulted by the ER physician and at this time since patient was hemodynamically stable, she was admitted to step down unit. Patient denied having had any recent surgery and has not taken any hormone pills. Last month she did travel to  [place] in a car.   Hospital Course:  Pulmonary embolism bilateral with saddle embolism: Second episode. First episode in 2005 after a long flight to [place], patient had taken her drive to [place] during Thanksgiving last year. She is currently hemodynamically stable. Patient was started on heparin drip for 48 hours, then started on xarelto. Her Insurance approved for xarelto the case management. I discussed in detail with [name] from hematology, appt made for 01/27/13 for f/u. She will need to have hypercoagulable workup done at the time of the appointment as this is her second episode of pulmonary embolism.   I did recommend the patient for 2-D echocardiogram given massive pulmonary embolism bilateral with saddle embolism to rule out any right heart strain on right heart failure, patient adamantly refused to have the 2-D echocardiogram stating that she feels "her heart is fine". Doppler ultrasound of the lower extremities also was positive for acute DVT. Patient will continue xarelto 20 mg BID x 21 days, then 15 mg daily.  GERD (gastroesophageal reflux disease): Continue PPI    Day of Discharge BP 125/74  Pulse 88  Temp 97.8 F (36.6 C) (Oral)  Resp 27  Ht 5\' 8"  (1.727 m)  Wt 97.2 kg (214 lb 4.6 oz)  BMI 32.58 kg/m2  SpO2 94%  Physical Exam: General: Alert and awake oriented x3 not  in any acute distress. HEENT: anicteric sclera, pupils reactive to light and accommodation CVS: S1-S2 clear no murmur rubs or gallops Chest: clear to auscultation bilaterally, no wheezing rales or rhonchi Abdomen: soft nontender, nondistended, normal bowel sounds, no organomegaly Extremities: no cyanosis, clubbing or edema noted bilaterally Neuro: Cranial nerves II-XII intact, no focal neurological deficits   The results of significant diagnostics from this hospitalization (including imaging, microbiology, ancillary and laboratory) are listed below for reference.    LAB RESULTS: Basic Metabolic  Panel:  Lab 01/06/13 0201 01/05/13 1803  NA 139 139  K 3.7 3.9  CL 104 102  CO2 24 24  GLUCOSE 116* 136*  BUN 12 13  CREATININE 1.00 0.92  CALCIUM 9.4 9.9  MG -- --  PHOS -- --   CBC:  Lab 01/08/13 0545 01/07/13 0500 01/05/13 1803  WBC 7.1 7.5 --  NEUTROABS -- -- 4.6  HGB 13.4 12.7 --  HCT 40.2 38.5 --  MCV 98.8 -- --  PLT 184 166 --   Cardiac Enzymes:  Lab 01/05/13 2351  CKTOTAL --  CKMB --  CKMBINDEX --  TROPONINI <0.30    Significant Diagnostic Studies:  No results found.   Disposition and Follow-up:     Discharge Orders    Future Appointments: Provider: Department: Dept Phone: Center:   01/27/2013 10:30 AM [facility] [phone] None   01/27/2013 10:45 AM Dava Najjar [facility] [phone] None   01/27/2013 11:00 AM Benjiman Core, MD [facility] [phone] None     Future Orders Please Complete By Expires   Diet - low sodium heart healthy      Increase activity slowly      Discharge instructions      Comments:   1) Please take xarelto 15mg  2 times/day for 21 days, then continue 20mg  daily 2) Follow-up with Dr Clelia Croft for further work-up.       DISPOSITION: Home DIET: heart healthy ACTIVITY: as tolerated  TESTS THAT NEED FOLLOW-UP Hypercoagulable work-up  DISCHARGE FOLLOW-UP Follow-up Information    Follow up with [facility] Iii, MD. On 01/27/2013. (at 11:00 AM. Please arrive at 10:30am for paper-work and labs)    Contact information:   [address] [place] [address] [phone]          Time spent on Discharge: 35 mins  Signed:   RAI,RIPUDEEP M.D. [facility] 01/08/2013, 10:01 AM Pager: [phone]

## 2013-01-08 NOTE — Progress Notes (Signed)
Ambulated pt  on room air,  O2 sat dropped to 87-88% maintained  over of walk at the hallway , HR 110-111.  Return back  In room , O2 2l/[place] initiated, at rest HR 90-87, O2 sat  93%Dr Rai notified. Consult made with CM for home O2 need.

## 2013-01-18 ENCOUNTER — Other Ambulatory Visit: Payer: Self-pay | Admitting: Oncology

## 2013-01-18 DIAGNOSIS — I2699 Other pulmonary embolism without acute cor pulmonale: Secondary | ICD-10-CM

## 2013-01-27 ENCOUNTER — Encounter: Payer: Self-pay | Admitting: Oncology

## 2013-01-27 ENCOUNTER — Ambulatory Visit: Payer: Medicare Other

## 2013-01-27 ENCOUNTER — Ambulatory Visit (HOSPITAL_BASED_OUTPATIENT_CLINIC_OR_DEPARTMENT_OTHER): Payer: Medicare Other | Admitting: Oncology

## 2013-01-27 ENCOUNTER — Ambulatory Visit (HOSPITAL_BASED_OUTPATIENT_CLINIC_OR_DEPARTMENT_OTHER): Payer: Medicare Other | Admitting: Lab

## 2013-01-27 VITALS — BP 140/84 | HR 92 | Temp 96.6°F | Resp 20 | Ht 68.0 in | Wt 213.6 lb

## 2013-01-27 DIAGNOSIS — Z86711 Personal history of pulmonary embolism: Secondary | ICD-10-CM

## 2013-01-27 DIAGNOSIS — I8290 Acute embolism and thrombosis of unspecified vein: Secondary | ICD-10-CM

## 2013-01-27 DIAGNOSIS — I2699 Other pulmonary embolism without acute cor pulmonale: Secondary | ICD-10-CM | POA: Diagnosis not present

## 2013-01-27 LAB — COMPREHENSIVE METABOLIC PANEL (CC13)
BUN: 16.1 mg/dL (ref 7.0–26.0)
CO2: 22 mEq/L (ref 22–29)
Calcium: 9.1 mg/dL (ref 8.4–10.4)
Chloride: 106 mEq/L (ref 98–107)
Creatinine: 1 mg/dL (ref 0.6–1.1)
Glucose: 109 mg/dl — ABNORMAL HIGH (ref 70–99)

## 2013-01-27 LAB — CBC WITH DIFFERENTIAL/PLATELET
Basophils Absolute: 0 10*3/uL (ref 0.0–0.1)
EOS%: 1.5 % (ref 0.0–7.0)
Eosinophils Absolute: 0.1 10*3/uL (ref 0.0–0.5)
HCT: 43 % (ref 34.8–46.6)
HGB: 14.9 g/dL (ref 11.6–15.9)
MCH: 33.3 pg (ref 25.1–34.0)
MONO#: 0.8 10*3/uL (ref 0.1–0.9)
NEUT%: 37 % — ABNORMAL LOW (ref 38.4–76.8)
lymph#: 3.7 10*3/uL — ABNORMAL HIGH (ref 0.9–3.3)

## 2013-01-27 NOTE — Progress Notes (Signed)
Checked in new pt with no financial concerns. °

## 2013-01-27 NOTE — Progress Notes (Signed)
Note dictated

## 2013-01-28 NOTE — Progress Notes (Signed)
CC:   Farris Has, MD, Fax [phone]  REASON FOR CONSULTATION:  Pulmonary embolism.  HISTORY OF PRESENT ILLNESS:  This is a 79 year old woman currently of [place] for the last 12 to 13 years.  Has lived in multiple locations and currently retired.  She has had a few occupations in the past, but has not really worked for the last 20 years.  She was diagnosed with a pulmonary embolus back in 2005 after a long flight to and from [place] in a short period of time.  At that time, she was noted to have bilateral pulmonary embolus.  She has had what looks like DVT leading up to it.  She was anticoagulated with Coumadin for about 6 months and really back to her normal baseline for the last 9 years.  She presented back in January of 2014 with symptoms of shortness of breath after walking her dog, she developed hypoxia and subsequently was sent by her primary care physician to have a CT scan that was done on January 05, 2013.  CT scan showed a large pulmonary emboli involving both main pulmonary arteries with a saddle embolus, partially occlusive emboli noted in both lower lobes, right greater than left.  The patient was sent to the emergency department and was hospitalized between 01/15 to 01/08/2013.  The patient was started on heparin and subsequently was switched to Xarelto 50 mg b.i.d. and to start 20 mg daily at a later date.  She was also discharged on oxygen.  Since her discharge, she has really been doing very well and completely asymptomatic at this point. Her oxygen levels have been in the 96% and 97% as checked by her oximeter.  She does not report any chest pain, does not report any shortness of breath, does not report any lower extremity swelling.  She has really regained all her activities of daily living without any hindrance or decline.  She had not reported any blood clots in her legs, although has had poor vein circulation and had vein stripping multiple times in the  past.  REVIEW OF SYSTEMS:  She does not report any headaches, blurry vision, double vision.  Does not report any motor or sensory neuropathy.  Does not report any alteration in mental status.  Does not report any psychiatric issues or depression.  Does not report any fever, chills, sweats.  She does not report any cough, hemoptysis, hematemesis.  No nausea, vomiting.  Does not report any abdominal pain.  No hematochezia. No melena.  No genitourinary complaints.  The rest of review of systems is unremarkable.  PAST MEDICAL HISTORY:  Significant for a pulmonary embolus x2, as mentioned.  History of GERD.  She has also history of poor veins, as mentioned.  MEDICATION:  She is on Prevacid.  She is also on Xarelto 50 mg b.i.d. for 21 days, and once that is done,  she is going to go to 20 mg daily.  ALLERGIES:  She is allergic to sulfa.  SOCIAL HISTORY:  She is married.  She has 6 children.  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  Again, she has not worked in the last 20 years.  She denied any alcohol or tobacco abuse at this time.  FAMILY HISTORY:  Her mother had a colon cancer.  She developed a pulmonary embolus and possibly died from that.  Her father had a lot of poor veins but unclear of whether he  had a DVT but appears to have had a phlebitis.  Both of her siblings are deceased but unclear whether they had any blood clots or thrombosis.  PHYSICAL EXAMINATION:  General:  Alert, awake, pleasant woman.  Appeared in no distress.  Vitals:  Blood pressure of 140/84, pulse 92, respirations 20, temperature is 96.6.  Weight is 213 pounds.  HEENT: Head is normocephalic, atraumatic.  Pupils equal, round, reactive to light.  Oral mucosa moist and pink.  Neck:  Supple.  No lymphadenopathy. Heart:  Regular rate and rhythm.  S1, S2.  Lungs:  Clear to auscultation.  Abdomen:  Soft, nontender.  No  hepatosplenomegaly. Extremities:  No clubbing, cyanosis, or edema.  Neurologic:  Intact motor, sensory, and deep tendon reflexes.  LABORATORY DATA:  Hemoglobin of 14, white cell count 7.4, platelet count of 252.  Chemistry showed normal liver function tests and normal creatinine.  ASSESSMENT AND PLAN:  A 79 year old woman with the following issues: 1. History of a pulmonary embolus back in 2005 after a long flight to     [place] and most recently developed an acute pulmonary embolus     with a saddle embolus or rather a large clot burden.  She was     briefly symptomatic but resolved spontaneously  after treatment     with heparin and Xarelto.  I have discussed the natural history of     thrombosis in general and her case specifically and have talked to     her about her risk of having an inherited thrombophilia.  She     definitely had really provoked clots on both occasions, the 2nd     occasion after a long trip in the car to [place], but that does     not really rule out that she might have an inherited thrombophilia     such as Factor V Leiden mutation among other ones.  For that, I     have conducted a complete hypercoagulable workup that includes     Factor V Leiden mutation, prothrombin gene mutation, as well as     antiphospholipid antibodies, and depending on that, it can change     managements for subsequent clots.  For the time being, I do agree     with 6 months of Xarelto.  I have counseled her about avoiding     situations that increase her risks, including long flights, long     car rides, surgery, and high estrogen states being less likely.  I     will communicate the finding of her hypercoagulable states, and of     all of her questions were answered. 2. Hypoxia due to pulmonary embolus that have resolved at this time.     I do not think she will need oxygen at home at this point.    ______________________________ Benjiman Core, M.D. FNS/MEDQ  D:   01/27/2013  T:  01/27/2013  Job:  [id number]

## 2013-01-31 ENCOUNTER — Telehealth: Payer: Self-pay | Admitting: *Deleted

## 2013-01-31 LAB — HYPERCOAGULABLE PANEL, COMPREHENSIVE
AntiThromb III Func: 122 % (ref 76–126)
Anticardiolipin IgG: 6 GPL U/mL (ref <23)
Anticardiolipin IgM: 0 MPL U/mL (ref <11)
Beta-2 Glyco I IgG: 2 G Units (ref <20)
Beta-2-Glycoprotein I IgM: 6 M Units (ref <20)
Lupus Anticoagulant: DETECTED — AB
PTT Lupus Anticoagulant: 51.7 secs — ABNORMAL HIGH (ref 28.0–43.0)
Protein C, Total: 79 % (ref 72–160)
Protein S Activity: 124 % (ref 69–129)
Protein S Total: 65 % (ref 60–150)

## 2013-01-31 NOTE — Telephone Encounter (Signed)
Patient calling to ask if ok to have dental work, 2 fillings, while she is on xarelto? Ok per dr Clelia Croft.

## 2013-02-04 LAB — HEXAGONAL PHOSPHOLIPID NEUTRALIZATION: Hex Phosph Neut Test: NEGATIVE

## 2013-02-14 ENCOUNTER — Other Ambulatory Visit: Payer: Self-pay | Admitting: Oncology

## 2013-02-14 ENCOUNTER — Telehealth: Payer: Self-pay | Admitting: Oncology

## 2013-02-14 NOTE — Telephone Encounter (Signed)
lmonvm advising the pt of her aug 2014 appts

## 2013-07-14 ENCOUNTER — Other Ambulatory Visit: Payer: Medicare Other | Admitting: Lab

## 2013-07-26 ENCOUNTER — Telehealth: Payer: Self-pay | Admitting: Oncology

## 2013-07-26 ENCOUNTER — Other Ambulatory Visit (HOSPITAL_BASED_OUTPATIENT_CLINIC_OR_DEPARTMENT_OTHER): Payer: Medicare Other

## 2013-07-26 ENCOUNTER — Ambulatory Visit (HOSPITAL_BASED_OUTPATIENT_CLINIC_OR_DEPARTMENT_OTHER): Payer: Medicare Other | Admitting: Oncology

## 2013-07-26 VITALS — BP 144/77 | HR 86 | Temp 96.7°F | Resp 20 | Ht 68.0 in | Wt 220.6 lb

## 2013-07-26 DIAGNOSIS — I2699 Other pulmonary embolism without acute cor pulmonale: Secondary | ICD-10-CM | POA: Diagnosis not present

## 2013-07-26 DIAGNOSIS — I82409 Acute embolism and thrombosis of unspecified deep veins of unspecified lower extremity: Secondary | ICD-10-CM

## 2013-07-26 DIAGNOSIS — D6859 Other primary thrombophilia: Secondary | ICD-10-CM

## 2013-07-26 DIAGNOSIS — Z86718 Personal history of other venous thrombosis and embolism: Secondary | ICD-10-CM

## 2013-07-26 NOTE — Progress Notes (Signed)
Hematology and Oncology Follow Up Visit  Stacy Valencia [id number] June 20, 1933 79 y.o. 07/26/2013 10:23 AM Stacy Valencia, MDMorrow, [name], MD   Principle Diagnosis: 79 year old woman with history of a pulmonary embolus diagnosed in January of 2014 where she presented with symptoms of shortness of breath and found to have a saddle pulmonary embolus. It appears to be provoked after a long car ride.   Prior Therapy: She is status post anticoagulation from her previous pulmonary embolus back in 2005.  Current therapy: She is on Xarelto 20 mg daily since January of 2014.  Interim History: [name] presents today for a followup visit. He is a very nice woman I saw for the first time back in February of 2014 with the above diagnoses. Since her last visit, she had been doing very well. She has tolerated his Xarelto without any complications. She was able to get her teeth cleaned without any bleeding. Had not reported any bleeding or thrombosis issues since her last visit. She had multiple travels since her last visit that includes a flight to [place] as well as car rides or extended period of time. Overall, she has no problems with Xarelto for the time being.  Medications: I have reviewed the patient's current medications.  Current Outpatient Prescriptions  Medication Sig Dispense Refill  . lansoprazole (PREVACID) 15 MG capsule Take 15 mg by mouth daily.      . Rivaroxaban (XARELTO) 20 MG TABS Take 1 tablet (20 mg total) by mouth daily. After 15mg  2times/daily for 21 days is completed, then start 20mg  dose  60 tablet  3   No current facility-administered medications for this visit.     Allergies:  Allergies  Allergen Reactions  . Sulfa Antibiotics Nausea And Vomiting    Past Medical History, Surgical history, Social history, and Family History were reviewed and updated.  Review of Systems: Remaining ROS negative. Physical Exam: Blood pressure 144/77, pulse 86, temperature 96.7 F (35.9  C), temperature source Oral, resp. rate 20, height 5\' 8"  (1.727 m), weight 220 lb 9.6 oz (100.064 kg). ECOG: 0 General appearance: alert and cooperative Head: Normocephalic, without obvious abnormality, atraumatic Neck: no adenopathy, no carotid bruit, no JVD, supple, symmetrical, trachea midline and thyroid not enlarged, symmetric, no tenderness/mass/nodules Lymph nodes: Cervical, supraclavicular, and axillary nodes normal. Heart:regular rate and rhythm, S1, S2 normal, no murmur, click, rub or gallop Lung:chest clear, no wheezing, rales, normal symmetric air entry Abdomin: soft, non-tender, without masses or organomegaly EXT:no erythema, induration, or nodules   Lab Results: Lab Results  Component Value Date   WBC 7.4 01/27/2013   HGB 14.9 01/27/2013   HCT 43.0 01/27/2013   MCV 96.4 01/27/2013   PLT 252 01/27/2013     Chemistry      Component Value Date/Time   NA 138 01/27/2013 1033   NA 139 01/06/2013 0201   K 4.0 01/27/2013 1033   K 3.7 01/06/2013 0201   CL 106 01/27/2013 1033   CL 104 01/06/2013 0201   CO2 22 01/27/2013 1033   CO2 24 01/06/2013 0201   BUN 16.1 01/27/2013 1033   BUN 12 01/06/2013 0201   CREATININE 1.0 01/27/2013 1033   CREATININE 1.00 01/06/2013 0201      Component Value Date/Time   CALCIUM 9.1 01/27/2013 1033   CALCIUM 9.4 01/06/2013 0201   ALKPHOS 94 01/27/2013 1033   AST 21 01/27/2013 1033   ALT 14 01/27/2013 1033   BILITOT 0.61 01/27/2013 1033       Impression  and Plan:   79 year old woman with a history of thrombosis with 2 separate episodes 9 years apart. The first one in 2005 and the other one in 2014 were provoked by a long travel history. She had been on Xarelto since January of 2014. Her hypercoagulable workup didn't reveal the presence of a positive lupus anticoagulant. It is unclear this is atrial finding versus a false positive.  This and benefits of continuing anticoagulation were discussed today in detail. I feel given her history of 2 rather life-threatening  pulmonary emboli as well as a positive lupus anticoagulant I am inclined to recommend continuing anticoagulation was a role to for at least another 6 months where at that time I will repeat her lupus anticoagulant. If she continued to be positive I would recommend continuing Xarelto probably indefinitely. If her lupus anticoagulant are negative and we can have another risk benefit analysis and discussion regarding continuing or stopping Xarelto. But for the time being I recommend continuing full dose anticoagulation for at least a January of 2015.  [facility], MD 8/5/201410:23 AM

## 2013-07-26 NOTE — Telephone Encounter (Signed)
gv and printed appt sched and avs for pt  °

## 2013-07-27 LAB — LUPUS ANTICOAGULANT PANEL
DRVVT 1:1 Mix: 36.5 secs (ref <42.9)
DRVVT: 44.1 secs — ABNORMAL HIGH (ref <42.9)
PTT Lupus Anticoagulant: 34.7 secs (ref 28.0–43.0)

## 2013-09-29 ENCOUNTER — Other Ambulatory Visit: Payer: Self-pay | Admitting: *Deleted

## 2013-09-29 MED ORDER — RIVAROXABAN 20 MG PO TABS: 20.0000 mg | ORAL_TABLET | Freq: Every day | ORAL | Status: DC

## 2013-09-29 NOTE — Telephone Encounter (Signed)
Per dr Clelia Croft, okay to refill xaralto.

## 2013-12-20 ENCOUNTER — Other Ambulatory Visit (HOSPITAL_BASED_OUTPATIENT_CLINIC_OR_DEPARTMENT_OTHER): Payer: Medicare Other

## 2013-12-20 ENCOUNTER — Encounter (INDEPENDENT_AMBULATORY_CARE_PROVIDER_SITE_OTHER): Payer: Self-pay

## 2013-12-20 DIAGNOSIS — I2699 Other pulmonary embolism without acute cor pulmonale: Secondary | ICD-10-CM | POA: Diagnosis not present

## 2013-12-20 DIAGNOSIS — Z86718 Personal history of other venous thrombosis and embolism: Secondary | ICD-10-CM

## 2013-12-20 DIAGNOSIS — D6859 Other primary thrombophilia: Secondary | ICD-10-CM | POA: Diagnosis not present

## 2013-12-20 LAB — CBC WITH DIFFERENTIAL/PLATELET
Eosinophils Absolute: 0.1 10*3/uL (ref 0.0–0.5)
HCT: 43.9 % (ref 34.8–46.6)
LYMPH%: 47.6 % (ref 14.0–49.7)
MCV: 98.4 fL (ref 79.5–101.0)
MONO#: 0.9 10*3/uL (ref 0.1–0.9)
MONO%: 11.2 % (ref 0.0–14.0)
NEUT#: 3 10*3/uL (ref 1.5–6.5)
NEUT%: 38.8 % (ref 38.4–76.8)
Platelets: 247 10*3/uL (ref 145–400)
RBC: 4.46 10*6/uL (ref 3.70–5.45)

## 2013-12-20 LAB — COMPREHENSIVE METABOLIC PANEL (CC13)
Alkaline Phosphatase: 84 U/L (ref 40–150)
Anion Gap: 11 mEq/L (ref 3–11)
BUN: 20.8 mg/dL (ref 7.0–26.0)
CO2: 22 mEq/L (ref 22–29)
Creatinine: 1 mg/dL (ref 0.6–1.1)
Glucose: 83 mg/dl (ref 70–140)
Sodium: 138 mEq/L (ref 136–145)
Total Bilirubin: 0.54 mg/dL (ref 0.20–1.20)
Total Protein: 7.1 g/dL (ref 6.4–8.3)

## 2013-12-21 LAB — LUPUS ANTICOAGULANT PANEL
DRVVT: 36.5 secs (ref <42.9)
PTT Lupus Anticoagulant: 34.8 secs (ref 28.0–43.0)

## 2014-01-03 ENCOUNTER — Ambulatory Visit (HOSPITAL_BASED_OUTPATIENT_CLINIC_OR_DEPARTMENT_OTHER): Payer: Medicare Other | Admitting: Oncology

## 2014-01-03 ENCOUNTER — Encounter: Payer: Self-pay | Admitting: Oncology

## 2014-01-03 ENCOUNTER — Telehealth: Payer: Self-pay | Admitting: Oncology

## 2014-01-03 VITALS — BP 141/70 | HR 82 | Temp 97.8°F | Resp 18 | Ht 68.0 in | Wt 221.4 lb

## 2014-01-03 DIAGNOSIS — Z7901 Long term (current) use of anticoagulants: Secondary | ICD-10-CM | POA: Diagnosis not present

## 2014-01-03 DIAGNOSIS — I2699 Other pulmonary embolism without acute cor pulmonale: Secondary | ICD-10-CM

## 2014-01-03 DIAGNOSIS — Z86711 Personal history of pulmonary embolism: Secondary | ICD-10-CM | POA: Diagnosis not present

## 2014-01-03 NOTE — Progress Notes (Signed)
Hematology and Oncology Follow Up Visit  Stacy Valencia [id number] 10-09-1933 80 y.o. 01/03/2014 9:42 AM [name], MDMorrow, Jannett Celestine, MD   Principle Diagnosis: 80 year old woman with history of a pulmonary embolus diagnosed in January of 2014 where she presented with symptoms of shortness of breath and found to have a saddle pulmonary embolus. It appears to be provoked after a long car ride.   Prior Therapy: She is status post anticoagulation from her previous pulmonary embolus back in 2005.  Current therapy: She is on Xarelto 20 mg daily since January of 2014.  Interim History: Stacy Valencia presents today for a followup visit. He is a very nice woman I saw for the first time back in February of 2014 with the above diagnoses. Since her last visit, she had been doing very well. She has tolerated his Xarelto without any complications. She was able to get her teeth cleaned without any bleeding. Had not reported any bleeding or thrombosis issues since her last visit. She reports no other problems or hospitalizations  Medications: I have reviewed the patient's current medications.  Current Outpatient Prescriptions  Medication Sig Dispense Refill  . lansoprazole (PREVACID) 15 MG capsule Take 15 mg by mouth daily.      . Rivaroxaban (XARELTO) 20 MG TABS tablet Take 1 tablet (20 mg total) by mouth daily. After 15mg  2times/daily for 21 days is completed, then start 20mg  dose  60 tablet  2   No current facility-administered medications for this visit.     Allergies:  Allergies  Allergen Reactions  . Sulfa Antibiotics Nausea And Vomiting    Past Medical History, Surgical history, Social history, and Family History were reviewed and updated.  Review of Systems: Remaining ROS negative. Physical Exam: Blood pressure 141/70, pulse 82, temperature 97.8 F (36.6 C), temperature source Oral, resp. rate 18, height 5\' 8"  (1.727 m), weight 221 lb 6.4 oz (100.426 kg). ECOG: 0 General appearance:  alert and cooperative Head: Normocephalic, without obvious abnormality, atraumatic Neck: no adenopathy, no carotid bruit, no JVD, supple, symmetrical, trachea midline and thyroid not enlarged, symmetric, no tenderness/mass/nodules Lymph nodes: Cervical, supraclavicular, and axillary nodes normal. Heart:regular rate and rhythm, S1, S2 normal, no murmur, click, rub or gallop Lung:chest clear, no wheezing, rales, normal symmetric air entry Abdomin: soft, non-tender, without masses or organomegaly EXT:no erythema, induration, or nodules   Lab Results: Lab Results  Component Value Date   WBC 7.7 12/20/2013   HGB 14.7 12/20/2013   HCT 43.9 12/20/2013   MCV 98.4 12/20/2013   PLT 247 12/20/2013     Chemistry      Component Value Date/Time   NA 138 12/20/2013 0952   NA 139 01/06/2013 0201   K 4.3 12/20/2013 0952   K 3.7 01/06/2013 0201   CL 106 01/27/2013 1033   CL 104 01/06/2013 0201   CO2 22 12/20/2013 0952   CO2 24 01/06/2013 0201   BUN 20.8 12/20/2013 0952   BUN 12 01/06/2013 0201   CREATININE 1.0 12/20/2013 0952   CREATININE 1.00 01/06/2013 0201      Component Value Date/Time   CALCIUM 9.7 12/20/2013 0952   CALCIUM 9.4 01/06/2013 0201   ALKPHOS 84 12/20/2013 0952   AST 21 12/20/2013 0952   ALT 20 12/20/2013 0952   BILITOT 0.54 12/20/2013 0952     Results for Maberry, Daylene (MRN [id number]) as of 01/03/2014 09:42  Ref. Range 07/26/2013 09:30 12/20/2013 09:53  Lupus Anticoagulant Latest Range: NOT DETECTED  NOT DETECTED NOT DETECTED  Impression and Plan:   80 year old woman with a history of thrombosis with 2 separate episodes 9 years apart. The first one in 2005 and the other one in 2014 were provoked by a long travel history. She had been on Xarelto since January of 2014. Her hypercoagulable workup repeated and didn't reveal the presence of a positive lupus anticoagulant.   This and benefits of continuing anticoagulation were discussed today in detail. I feel given her history of  2 rather life-threatening pulmonary emboli as well as a positive lupus anticoagulant I am inclined to recommend continuing anticoagulation was a role to for at least another 6 months.   Risks and benefits of switching her to aspirin were discussed today and we have elected to continue with Xarelto and we'll reevaluate in 6 months.   [facility], MD 1/13/20159:42 AM

## 2014-01-03 NOTE — Telephone Encounter (Signed)
gave pt appt for MD appt on July 2015

## 2014-04-27 ENCOUNTER — Other Ambulatory Visit: Payer: Self-pay | Admitting: Oncology

## 2014-04-27 DIAGNOSIS — Z86718 Personal history of other venous thrombosis and embolism: Secondary | ICD-10-CM

## 2014-04-27 DIAGNOSIS — I2699 Other pulmonary embolism without acute cor pulmonale: Secondary | ICD-10-CM

## 2014-04-27 DIAGNOSIS — D6859 Other primary thrombophilia: Secondary | ICD-10-CM

## 2014-06-29 DIAGNOSIS — J40 Bronchitis, not specified as acute or chronic: Secondary | ICD-10-CM | POA: Diagnosis not present

## 2014-07-04 ENCOUNTER — Telehealth: Payer: Self-pay | Admitting: Oncology

## 2014-07-04 ENCOUNTER — Encounter: Payer: Self-pay | Admitting: Oncology

## 2014-07-04 ENCOUNTER — Ambulatory Visit (HOSPITAL_BASED_OUTPATIENT_CLINIC_OR_DEPARTMENT_OTHER): Payer: Medicare Other | Admitting: Oncology

## 2014-07-04 VITALS — BP 148/77 | HR 86 | Temp 97.9°F | Resp 18 | Ht 68.0 in | Wt 216.0 lb

## 2014-07-04 DIAGNOSIS — Z7901 Long term (current) use of anticoagulants: Secondary | ICD-10-CM | POA: Diagnosis not present

## 2014-07-04 DIAGNOSIS — I2699 Other pulmonary embolism without acute cor pulmonale: Secondary | ICD-10-CM

## 2014-07-04 DIAGNOSIS — Z86711 Personal history of pulmonary embolism: Secondary | ICD-10-CM | POA: Diagnosis not present

## 2014-07-04 NOTE — Progress Notes (Signed)
Hematology and Oncology Follow Up Visit  Stacy Valencia [id number] 02-18-1933 80 y.o. 07/04/2014 10:01 AM Stacy Valencia, MDNo ref. provider found   Principle Diagnosis: 80 year old woman with history of a pulmonary embolus diagnosed in January of 2014 where she presented with symptoms of shortness of breath and found to have a saddle pulmonary embolus. It appears to be provoked after a long car ride.   Prior Therapy: She is status post anticoagulation from her previous pulmonary embolus back in 2005.  Current therapy: She is on Xarelto 20 mg daily since January of 2014.  Interim History: Stacy Valencia presents today for a followup visit with her husband.  Since her last visit, she had been doing very well. She has tolerated his Xarelto without any complications. She is not reporting  any bleeding. Had not reported any bleeding or thrombosis issues since her last visit. She reports no other problems or hospitalizations. She did have an episode of bronchitis where she finished a course of antibiotics without any complications. She does not report any headaches or blurry vision or double vision. Has not reported any chest pain or cough. Has not reported any nausea or vomiting or abdominal discomfort. Has not reported any hematochezia or melena. Has not reported easy bruisability, petechiae or rashes. She has not reported any syncope or change in her multiple status. Rest of her review of systems unremarkable.  Medications: I have reviewed the patient's current medications.  Current Outpatient Prescriptions  Medication Sig Dispense Refill  . omeprazole (PRILOSEC) 20 MG capsule Take 20 mg by mouth daily.      . rivaroxaban (XARELTO) 20 MG TABS tablet Take 1 tablet (20 mg total) by mouth daily.  60 tablet  2   No current facility-administered medications for this visit.     Allergies:  Allergies  Allergen Reactions  . Sulfa Antibiotics Nausea And Vomiting    Past Medical History, Surgical  history, Social history, and Family History were reviewed and updated.   Physical Exam: There were no vitals taken for this visit. ECOG: 0 General appearance: alert and cooperative Head: Normocephalic, without obvious abnormality, atraumatic Neck: no adenopathy Lymph nodes: Cervical, supraclavicular, and axillary nodes normal. Heart:regular rate and rhythm, S1, S2 normal, no murmur, click, rub or gallop Lung:chest clear, no wheezing, rales, normal symmetric air entry Abdomin: soft, non-tender, without masses or organomegaly EXT:no erythema, induration, or nodules   Lab Results: Lab Results  Component Value Date   WBC 7.7 12/20/2013   HGB 14.7 12/20/2013   HCT 43.9 12/20/2013   MCV 98.4 12/20/2013   PLT 247 12/20/2013     Chemistry      Component Value Date/Time   NA 138 12/20/2013 0952   NA 139 01/06/2013 0201   K 4.3 12/20/2013 0952   K 3.7 01/06/2013 0201   CL 106 01/27/2013 1033   CL 104 01/06/2013 0201   CO2 22 12/20/2013 0952   CO2 24 01/06/2013 0201   BUN 20.8 12/20/2013 0952   BUN 12 01/06/2013 0201   CREATININE 1.0 12/20/2013 0952   CREATININE 1.00 01/06/2013 0201      Component Value Date/Time   CALCIUM 9.7 12/20/2013 0952   CALCIUM 9.4 01/06/2013 0201   ALKPHOS 84 12/20/2013 0952   AST 21 12/20/2013 0952   ALT 20 12/20/2013 0952   BILITOT 0.54 12/20/2013 0952       Impression and Plan:   80 year old woman with a history of thrombosis with tswo separate episodes nine years apart. The first  one in 2005 and the other one in 2014 were provoked by a long travel history. She had been on Xarelto since January of 2014. Her hypercoagulable workup repeated and didn't reveal the presence of a positive lupus anticoagulant.   This and benefits of continuing anticoagulation were discussed today in detail. We recommend continuing anticoagulation with Xarelto for at least another 6 months.   Risks and benefits of switching her to aspirin were discussed today and we have  elected to continue with Xarelto and we will reevaluate in 6 months.   [facility], MD 7/14/201510:01 AM

## 2014-07-04 NOTE — Telephone Encounter (Signed)
gv adn rpinted appt sched and avs for pt for Jan 2016

## 2014-08-15 DIAGNOSIS — I2699 Other pulmonary embolism without acute cor pulmonale: Secondary | ICD-10-CM | POA: Diagnosis not present

## 2014-08-15 DIAGNOSIS — N951 Menopausal and female climacteric states: Secondary | ICD-10-CM | POA: Diagnosis not present

## 2014-08-15 DIAGNOSIS — E785 Hyperlipidemia, unspecified: Secondary | ICD-10-CM | POA: Diagnosis not present

## 2014-08-15 DIAGNOSIS — Z0000 Encounter for general adult medical examination without abnormal findings: Secondary | ICD-10-CM | POA: Diagnosis not present

## 2014-08-15 DIAGNOSIS — Z23 Encounter for immunization: Secondary | ICD-10-CM | POA: Diagnosis not present

## 2014-09-19 DIAGNOSIS — M899 Disorder of bone, unspecified: Secondary | ICD-10-CM | POA: Diagnosis not present

## 2014-09-19 DIAGNOSIS — M949 Disorder of cartilage, unspecified: Secondary | ICD-10-CM | POA: Diagnosis not present

## 2014-11-06 DIAGNOSIS — H2513 Age-related nuclear cataract, bilateral: Secondary | ICD-10-CM | POA: Diagnosis not present

## 2014-11-07 ENCOUNTER — Other Ambulatory Visit: Payer: Self-pay | Admitting: Oncology

## 2014-12-26 ENCOUNTER — Telehealth: Payer: Self-pay | Admitting: Oncology

## 2014-12-26 ENCOUNTER — Ambulatory Visit (HOSPITAL_BASED_OUTPATIENT_CLINIC_OR_DEPARTMENT_OTHER): Payer: Medicare Other | Admitting: Oncology

## 2014-12-26 VITALS — BP 151/80 | HR 87 | Temp 97.4°F | Resp 19 | Ht 68.0 in | Wt 216.6 lb

## 2014-12-26 DIAGNOSIS — I2692 Saddle embolus of pulmonary artery without acute cor pulmonale: Secondary | ICD-10-CM | POA: Diagnosis not present

## 2014-12-26 DIAGNOSIS — I2699 Other pulmonary embolism without acute cor pulmonale: Secondary | ICD-10-CM

## 2014-12-26 DIAGNOSIS — Z86718 Personal history of other venous thrombosis and embolism: Secondary | ICD-10-CM

## 2014-12-26 NOTE — Progress Notes (Signed)
Hematology and Oncology Follow Up Visit  Stacy Valencia [id number] September 05, 1933 81 y.o. 12/26/2014 9:54 AM [name], MDNo ref. provider found   Principle Diagnosis: 81 year old woman with history of a pulmonary embolus diagnosed in January of 2014 where she presented with symptoms of shortness of breath and found to have a saddle pulmonary embolus. It appears to be provoked after a long car ride.   Prior Therapy: She is status post anticoagulation from her previous pulmonary embolus back in 2005.  Current therapy: She is on Xarelto 20 mg daily since January of 2014.  Interim History: Stacy Valencia presents today for a followup visit with her husband. She had been doing very well since the last visit. She has tolerated his Xarelto without any complications. She is not reporting  any bleeding. Had not reported any bleeding or thrombosis issues. She reports no other problems or hospitalizations. She did have a minor, on her to without any excessive bleeding.  She does not report any headaches or blurry vision or double vision. Has not reported any chest pain or cough. Has not reported any nausea or vomiting or abdominal discomfort. Has not reported any hematochezia or melena. Has not reported easy bruisability, petechiae or rashes. She has not reported any syncope or change in her multiple status. Rest of her review of systems unremarkable.  Medications: I have reviewed the patient's current medications.  Current Outpatient Prescriptions  Medication Sig Dispense Refill  . omeprazole (PRILOSEC) 20 MG capsule Take 20 mg by mouth daily.    Alveda Reasons 20 MG TABS tablet TAKE 1 TABLET (20 MG TOTAL) BY MOUTH DAILY. 60 tablet 1   No current facility-administered medications for this visit.     Allergies:  Allergies  Allergen Reactions  . Sulfa Antibiotics Nausea And Vomiting    Past Medical History, Surgical history, Social history, and Family History were reviewed and updated.   Physical  Exam: Blood pressure 151/80, pulse 87, temperature 97.4 F (36.3 C), temperature source Oral, resp. rate 19, height 5\' 8"  (1.727 m), weight 216 lb 9.6 oz (98.249 kg), SpO2 97 %. ECOG: 0 General appearance: alert and cooperative Head: Normocephalic, without obvious abnormality, atraumatic Neck: no adenopathy Lymph nodes: Cervical, supraclavicular, and axillary nodes normal. Heart:regular rate and rhythm, S1, S2 normal, no murmur, click, rub or gallop Lung:chest clear, no wheezing, rales, normal symmetric air entry Abdomin: soft, non-tender, without masses or organomegaly EXT:no erythema, induration, or nodules   Lab Results: Lab Results  Component Value Date   WBC 7.7 12/20/2013   HGB 14.7 12/20/2013   HCT 43.9 12/20/2013   MCV 98.4 12/20/2013   PLT 247 12/20/2013     Chemistry      Component Value Date/Time   NA 138 12/20/2013 0952   NA 139 01/06/2013 0201   K 4.3 12/20/2013 0952   K 3.7 01/06/2013 0201   CL 106 01/27/2013 1033   CL 104 01/06/2013 0201   CO2 22 12/20/2013 0952   CO2 24 01/06/2013 0201   BUN 20.8 12/20/2013 0952   BUN 12 01/06/2013 0201   CREATININE 1.0 12/20/2013 0952   CREATININE 1.00 01/06/2013 0201      Component Value Date/Time   CALCIUM 9.7 12/20/2013 0952   CALCIUM 9.4 01/06/2013 0201   ALKPHOS 84 12/20/2013 0952   AST 21 12/20/2013 0952   ALT 20 12/20/2013 0952   BILITOT 0.54 12/20/2013 0952       Impression and Plan:   81 year old woman with a history of thrombosis  with tswo separate episodes nine years apart. The first one in 2005 and the other one in 2014 were provoked by a long travel history. She had been on Xarelto since January of 2014. Her hypercoagulable workup repeated and didn't reveal the presence of a positive lupus anticoagulant.   This and benefits of continuing anticoagulation were discussed again today. We recommend continuing anticoagulation with Xarelto for further time being. She is comfortable with this decision and  would rather stay on full dose anticoagulation given her history.  We will continue to evaluate this in the future but for the time being it appears that she will be on this anticoagulation indefinitely.  [facility], MD 1/5/20169:54 AM

## 2014-12-26 NOTE — Telephone Encounter (Signed)
gv and printed appt sched and avs for pt for Aug 2016 °

## 2015-03-12 ENCOUNTER — Other Ambulatory Visit: Payer: Self-pay | Admitting: Oncology

## 2015-04-06 DIAGNOSIS — R109 Unspecified abdominal pain: Secondary | ICD-10-CM | POA: Diagnosis not present

## 2015-05-28 ENCOUNTER — Other Ambulatory Visit: Payer: Self-pay | Admitting: Oncology

## 2015-07-24 ENCOUNTER — Telehealth: Payer: Self-pay | Admitting: Oncology

## 2015-07-24 ENCOUNTER — Ambulatory Visit (HOSPITAL_BASED_OUTPATIENT_CLINIC_OR_DEPARTMENT_OTHER): Payer: Medicare Other | Admitting: Oncology

## 2015-07-24 VITALS — BP 140/69 | HR 94 | Temp 98.1°F | Resp 18 | Ht 68.0 in | Wt 222.8 lb

## 2015-07-24 DIAGNOSIS — Z86711 Personal history of pulmonary embolism: Secondary | ICD-10-CM | POA: Diagnosis not present

## 2015-07-24 DIAGNOSIS — I82409 Acute embolism and thrombosis of unspecified deep veins of unspecified lower extremity: Secondary | ICD-10-CM

## 2015-07-24 NOTE — Telephone Encounter (Signed)
Pt confirmed labs/ov per 08/02 POF, gave pt avs and calendar.... KJ °

## 2015-07-24 NOTE — Progress Notes (Signed)
Hematology and Oncology Follow Up Visit  Stacy Valencia [id number] March 20, 1933 81 y.o. 07/24/2015 9:53 AM Stacy Valencia, MDNo ref. provider found   Principle Diagnosis: 81 year old woman with history of a pulmonary embolus diagnosed in January of 2014 where she presented with symptoms of shortness of breath and found to have a saddle pulmonary embolus. It appears to be provoked after a long car ride.   Prior Therapy: She is status post anticoagulation from her previous pulmonary embolus back in 2005.  Current therapy: She is on Xarelto 20 mg daily since January of 2014.  Interim History: [name] presents today for a followup visit with her husband. Since the last visit, she continues to do very well. She has tolerated his Xarelto without any complications. She is not reporting any bleeding. She denied epistaxis, hematochezia, hematuria or hemoptysis.  She reports no other problems or hospitalizations. She has not had any thrombosis episodes.  She does not report any headaches or blurry vision or double vision. Has not reported any chest pain or cough. Has not reported any nausea or vomiting or abdominal discomfort. Has not reported any hematochezia or melena. Has not reported easy bruisability, petechiae or rashes. She has not reported any syncope or change in her multiple status. Rest of her review of systems unremarkable.  Medications: I have reviewed the patient's current medications.  Current Outpatient Prescriptions  Medication Sig Dispense Refill  . omeprazole (PRILOSEC) 20 MG capsule Take 20 mg by mouth daily.    Alveda Reasons 20 MG TABS tablet TAKE 1 TABLET (20 MG TOTAL) BY MOUTH DAILY. 60 tablet 0   No current facility-administered medications for this visit.     Allergies:  Allergies  Allergen Reactions  . Sulfa Antibiotics Nausea And Vomiting    Past Medical History, Surgical history, Social history, and Family History were reviewed and updated.   Physical Exam: Blood  pressure 140/69, pulse 94, temperature 98.1 F (36.7 C), temperature source Oral, resp. rate 18, height 5\' 8"  (1.727 m), weight 222 lb 12.8 oz (101.061 kg), SpO2 96 %. ECOG: 0 General appearance: alert and cooperative. NAD Head: Normocephalic, without obvious abnormality Neck: no adenopathy Lymph nodes: Cervical, supraclavicular, and axillary nodes normal. Heart:regular rate and rhythm, S1, S2 normal, no murmur, click, rub or gallop Lung:chest clear, no wheezing, rales, normal symmetric air entry Abdomin: soft, non-tender, without masses or organomegaly EXT:no erythema, induration, or nodules   Lab Results: Lab Results  Component Value Date   WBC 7.7 12/20/2013   HGB 14.7 12/20/2013   HCT 43.9 12/20/2013   MCV 98.4 12/20/2013   PLT 247 12/20/2013     Chemistry      Component Value Date/Time   NA 138 12/20/2013 0952   NA 139 01/06/2013 0201   K 4.3 12/20/2013 0952   K 3.7 01/06/2013 0201   CL 106 01/27/2013 1033   CL 104 01/06/2013 0201   CO2 22 12/20/2013 0952   CO2 24 01/06/2013 0201   BUN 20.8 12/20/2013 0952   BUN 12 01/06/2013 0201   CREATININE 1.0 12/20/2013 0952   CREATININE 1.00 01/06/2013 0201      Component Value Date/Time   CALCIUM 9.7 12/20/2013 0952   CALCIUM 9.4 01/06/2013 0201   ALKPHOS 84 12/20/2013 0952   AST 21 12/20/2013 0952   ALT 20 12/20/2013 0952   BILITOT 0.54 12/20/2013 0952       Impression and Plan:   81 year old woman with a history of thrombosis with tswo separate episodes nine years  apart. The first one in 2005 and the other one in 2014 were provoked by a long travel history. She had been on Xarelto since January of 2014. Her hypercoagulable workup repeated and didn't reveal the presence of a positive lupus anticoagulant.   Risks and benefits of Xarelto continuation were reviewed again today. Complications include GI bleeding among others were reviewed. Also the fact that there is no antidote to Xarelto makes bleeding more complex and  difficult to control. The benefit is certainly prevention for any further blood clots which could be life-threatening.  After review today, she elected to continue on Xarelto for the time being. We will review this issue again in 8 months.  She is to call us with any complications in the interim.  [facility], MD 8/2/20169:53 AM

## 2015-07-25 DIAGNOSIS — Z79899 Other long term (current) drug therapy: Secondary | ICD-10-CM | POA: Diagnosis not present

## 2015-07-25 DIAGNOSIS — M2550 Pain in unspecified joint: Secondary | ICD-10-CM | POA: Diagnosis not present

## 2015-07-25 DIAGNOSIS — D7589 Other specified diseases of blood and blood-forming organs: Secondary | ICD-10-CM | POA: Diagnosis not present

## 2015-08-15 DIAGNOSIS — T300 Burn of unspecified body region, unspecified degree: Secondary | ICD-10-CM | POA: Diagnosis not present

## 2015-08-15 DIAGNOSIS — M549 Dorsalgia, unspecified: Secondary | ICD-10-CM | POA: Diagnosis not present

## 2015-08-15 DIAGNOSIS — R609 Edema, unspecified: Secondary | ICD-10-CM | POA: Diagnosis not present

## 2015-08-20 DIAGNOSIS — Z23 Encounter for immunization: Secondary | ICD-10-CM | POA: Diagnosis not present

## 2015-08-20 DIAGNOSIS — R7309 Other abnormal glucose: Secondary | ICD-10-CM | POA: Diagnosis not present

## 2015-08-20 DIAGNOSIS — E785 Hyperlipidemia, unspecified: Secondary | ICD-10-CM | POA: Diagnosis not present

## 2015-08-20 DIAGNOSIS — Z0000 Encounter for general adult medical examination without abnormal findings: Secondary | ICD-10-CM | POA: Diagnosis not present

## 2015-08-20 DIAGNOSIS — T300 Burn of unspecified body region, unspecified degree: Secondary | ICD-10-CM | POA: Diagnosis not present

## 2015-08-20 DIAGNOSIS — M549 Dorsalgia, unspecified: Secondary | ICD-10-CM | POA: Diagnosis not present

## 2015-08-29 ENCOUNTER — Other Ambulatory Visit: Payer: Self-pay | Admitting: Oncology

## 2015-08-30 ENCOUNTER — Other Ambulatory Visit: Payer: Self-pay | Admitting: Oncology

## 2015-08-30 ENCOUNTER — Other Ambulatory Visit: Payer: Self-pay | Admitting: *Deleted

## 2015-08-30 DIAGNOSIS — T300 Burn of unspecified body region, unspecified degree: Secondary | ICD-10-CM | POA: Diagnosis not present

## 2015-08-30 DIAGNOSIS — M549 Dorsalgia, unspecified: Secondary | ICD-10-CM | POA: Diagnosis not present

## 2015-10-12 DIAGNOSIS — Z23 Encounter for immunization: Secondary | ICD-10-CM | POA: Diagnosis not present

## 2015-11-01 ENCOUNTER — Other Ambulatory Visit: Payer: Self-pay | Admitting: Oncology

## 2015-11-02 ENCOUNTER — Other Ambulatory Visit: Payer: Self-pay | Admitting: Oncology

## 2015-11-04 ENCOUNTER — Other Ambulatory Visit: Payer: Self-pay | Admitting: Oncology

## 2015-11-05 ENCOUNTER — Other Ambulatory Visit: Payer: Self-pay | Admitting: *Deleted

## 2015-11-05 ENCOUNTER — Telehealth: Payer: Self-pay | Admitting: *Deleted

## 2015-11-05 DIAGNOSIS — I82409 Acute embolism and thrombosis of unspecified deep veins of unspecified lower extremity: Secondary | ICD-10-CM

## 2015-11-05 MED ORDER — RIVAROXABAN 20 MG PO TABS: ORAL_TABLET | ORAL | Status: DC

## 2015-11-05 NOTE — Telephone Encounter (Signed)
Call received from patient's spouse stating "the pharmacy said the fax didn't work.  My wife is out of her xarelto.  I go through this every month.  Can she get a refill to last longer.  She doesn't have the brain to manage her medications."  Voicemail left to notify collaborative nurse.  Pending refills observed in Scientific laboratory technician for Altamese Dilling NP.

## 2015-11-09 DIAGNOSIS — H1010 Acute atopic conjunctivitis, unspecified eye: Secondary | ICD-10-CM | POA: Diagnosis not present

## 2015-11-09 DIAGNOSIS — J329 Chronic sinusitis, unspecified: Secondary | ICD-10-CM | POA: Diagnosis not present

## 2015-12-06 DIAGNOSIS — H2513 Age-related nuclear cataract, bilateral: Secondary | ICD-10-CM | POA: Diagnosis not present

## 2015-12-10 DIAGNOSIS — H353130 Nonexudative age-related macular degeneration, bilateral, stage unspecified: Secondary | ICD-10-CM | POA: Diagnosis not present

## 2015-12-10 DIAGNOSIS — H25813 Combined forms of age-related cataract, bilateral: Secondary | ICD-10-CM | POA: Diagnosis not present

## 2015-12-10 DIAGNOSIS — H353132 Nonexudative age-related macular degeneration, bilateral, intermediate dry stage: Secondary | ICD-10-CM | POA: Diagnosis not present

## 2015-12-10 DIAGNOSIS — H43813 Vitreous degeneration, bilateral: Secondary | ICD-10-CM | POA: Diagnosis not present

## 2015-12-24 ENCOUNTER — Other Ambulatory Visit: Payer: Self-pay | Admitting: Oncology

## 2015-12-31 ENCOUNTER — Other Ambulatory Visit: Payer: Self-pay | Admitting: *Deleted

## 2015-12-31 DIAGNOSIS — I82409 Acute embolism and thrombosis of unspecified deep veins of unspecified lower extremity: Secondary | ICD-10-CM

## 2015-12-31 MED ORDER — RIVAROXABAN 20 MG PO TABS: ORAL_TABLET | ORAL | Status: DC

## 2016-01-07 DIAGNOSIS — H2513 Age-related nuclear cataract, bilateral: Secondary | ICD-10-CM | POA: Diagnosis not present

## 2016-01-07 DIAGNOSIS — H25811 Combined forms of age-related cataract, right eye: Secondary | ICD-10-CM | POA: Diagnosis not present

## 2016-01-07 DIAGNOSIS — H2511 Age-related nuclear cataract, right eye: Secondary | ICD-10-CM | POA: Diagnosis not present

## 2016-01-28 DIAGNOSIS — H25812 Combined forms of age-related cataract, left eye: Secondary | ICD-10-CM | POA: Diagnosis not present

## 2016-01-28 DIAGNOSIS — H2513 Age-related nuclear cataract, bilateral: Secondary | ICD-10-CM | POA: Diagnosis not present

## 2016-01-28 DIAGNOSIS — H2512 Age-related nuclear cataract, left eye: Secondary | ICD-10-CM | POA: Diagnosis not present

## 2016-03-25 ENCOUNTER — Telehealth: Payer: Self-pay | Admitting: Oncology

## 2016-03-25 ENCOUNTER — Ambulatory Visit (HOSPITAL_BASED_OUTPATIENT_CLINIC_OR_DEPARTMENT_OTHER): Payer: Medicare Other | Admitting: Oncology

## 2016-03-25 VITALS — BP 154/73 | HR 94 | Temp 98.0°F | Resp 18 | Ht 68.0 in | Wt 212.7 lb

## 2016-03-25 DIAGNOSIS — Z86711 Personal history of pulmonary embolism: Secondary | ICD-10-CM

## 2016-03-25 DIAGNOSIS — I82409 Acute embolism and thrombosis of unspecified deep veins of unspecified lower extremity: Secondary | ICD-10-CM

## 2016-03-25 NOTE — Telephone Encounter (Signed)
Gave and printed appt sched and avs fo rpt for Jan °

## 2016-03-25 NOTE — Progress Notes (Signed)
Hematology and Oncology Follow Up Visit  Tniyah Shiplett [id number] August 01, 1933 82 y.o. 03/25/2016 10:12 AM [name], MDNo ref. provider found   Principle Diagnosis: 82 year old woman with history of a pulmonary embolus diagnosed in January of 2014 where she presented with symptoms of shortness of breath and found to have a saddle pulmonary embolus. It appears to be provoked after a long car ride.   Prior Therapy: She is status post anticoagulation from her previous pulmonary embolus back in 2005.  Current therapy: She is on Xarelto 20 mg daily since January of 2014.  Interim History: Mrs. Sarantos presents today for a followup visit with her husband. Since the last visit, she reports no major changes in her health. She continues on Xarelto without any complications. She denies any specific side effects associated with this medication. She did notice that her appetite is slightly down and of lost about 4 pounds since January 2016. She is not reporting any bleeding. She denied epistaxis, hematochezia, hematuria or hemoptysis. She denied any thrombotic events.  She does not report any headaches or blurry vision or double vision. Has not reported any chest pain or cough. Has not reported any nausea or vomiting or abdominal discomfort. Has not reported any hematochezia or melena. Has not reported easy bruisability, petechiae or rashes. She has not reported any syncope or change in her multiple status. Rest of her review of systems unremarkable.  Medications: I have reviewed the patient's current medications.  Current Outpatient Prescriptions  Medication Sig Dispense Refill  . omeprazole (PRILOSEC) 20 MG capsule Take 20 mg by mouth daily.    . rivaroxaban (XARELTO) 20 MG TABS tablet TAKE 1 TABLET (20 MG TOTAL) BY MOUTH DAILY. 30 tablet 3   No current facility-administered medications for this visit.     Allergies:  Allergies  Allergen Reactions  . Sulfa Antibiotics Nausea And Vomiting     Past Medical History, Surgical history, Social history, and Family History were reviewed and updated.   Physical Exam: Blood pressure 154/73, pulse 94, temperature 98 F (36.7 C), temperature source Oral, resp. rate 18, height 5\' 8"  (1.727 m), weight 212 lb 11.2 oz (96.48 kg), SpO2 94 %. ECOG: 0 General appearance: alert and cooperative. Appeared without distress. Head: Normocephalic, without obvious abnormality without oral ulcers or lesions. Neck: no adenopathy Lymph nodes: Cervical, supraclavicular, and axillary nodes normal. Heart:regular rate and rhythm, S1, S2 normal, no murmur, click, rub or gallop Lung:chest clear, no wheezing, rales, normal symmetric air entry Abdomin: soft, non-tender, without masses or organomegaly no shifting dullness or ascites. EXT:no erythema, induration, or nodules   Lab Results: Lab Results  Component Value Date   WBC 7.7 12/20/2013   HGB 14.7 12/20/2013   HCT 43.9 12/20/2013   MCV 98.4 12/20/2013   PLT 247 12/20/2013     Chemistry      Component Value Date/Time   NA 138 12/20/2013 0952   NA 139 01/06/2013 0201   K 4.3 12/20/2013 0952   K 3.7 01/06/2013 0201   CL 106 01/27/2013 1033   CL 104 01/06/2013 0201   CO2 22 12/20/2013 0952   CO2 24 01/06/2013 0201   BUN 20.8 12/20/2013 0952   BUN 12 01/06/2013 0201   CREATININE 1.0 12/20/2013 0952   CREATININE 1.00 01/06/2013 0201      Component Value Date/Time   CALCIUM 9.7 12/20/2013 0952   CALCIUM 9.4 01/06/2013 0201   ALKPHOS 84 12/20/2013 0952   AST 21 12/20/2013 0952   ALT 20  12/20/2013 0952   BILITOT 0.54 12/20/2013 0952       Impression and Plan:   82 year old woman with:  1. History of thrombosis with tswo separate episodes nine years apart. The first one in 2005 and the other one in 2014 were provoked by a long travel history. She had been on Xarelto since January of 2014. Her hypercoagulable workup repeated and didn't reveal the presence of a positive lupus  anticoagulant.   Risks and benefits of Xarelto continuation were reviewed in today's visit. She understands that continuing this medication increases her risk of GI bleeding among other bleeding complications. The benefit will be for further prevention for potentially life threatening blood clots. After discussion today she is agreeable to continue this medication at this time. The plan is to continue this medication indefinitely with periodic monitoring and assessing the risks and benefits on an intermittent basis.  2. Follow-up: Follow-up will be in January 2018 sooner if needed to.  [facility], MD 4/4/201710:12 AM

## 2016-05-23 ENCOUNTER — Other Ambulatory Visit: Payer: Self-pay | Admitting: Oncology

## 2016-05-26 ENCOUNTER — Other Ambulatory Visit: Payer: Self-pay | Admitting: *Deleted

## 2016-05-26 DIAGNOSIS — I82409 Acute embolism and thrombosis of unspecified deep veins of unspecified lower extremity: Secondary | ICD-10-CM

## 2016-05-26 MED ORDER — RIVAROXABAN 20 MG PO TABS: ORAL_TABLET | ORAL | Status: DC

## 2016-07-30 DIAGNOSIS — M25511 Pain in right shoulder: Secondary | ICD-10-CM | POA: Diagnosis not present

## 2016-07-30 DIAGNOSIS — R42 Dizziness and giddiness: Secondary | ICD-10-CM | POA: Diagnosis not present

## 2016-08-20 DIAGNOSIS — M19011 Primary osteoarthritis, right shoulder: Secondary | ICD-10-CM | POA: Diagnosis not present

## 2016-08-20 DIAGNOSIS — M25511 Pain in right shoulder: Secondary | ICD-10-CM | POA: Diagnosis not present

## 2016-08-20 DIAGNOSIS — M7581 Other shoulder lesions, right shoulder: Secondary | ICD-10-CM | POA: Diagnosis not present

## 2016-08-21 DIAGNOSIS — M8580 Other specified disorders of bone density and structure, unspecified site: Secondary | ICD-10-CM | POA: Diagnosis not present

## 2016-08-21 DIAGNOSIS — E785 Hyperlipidemia, unspecified: Secondary | ICD-10-CM | POA: Diagnosis not present

## 2016-08-21 DIAGNOSIS — L309 Dermatitis, unspecified: Secondary | ICD-10-CM | POA: Diagnosis not present

## 2016-08-21 DIAGNOSIS — Z0000 Encounter for general adult medical examination without abnormal findings: Secondary | ICD-10-CM | POA: Diagnosis not present

## 2016-08-21 DIAGNOSIS — Z86711 Personal history of pulmonary embolism: Secondary | ICD-10-CM | POA: Diagnosis not present

## 2016-08-21 DIAGNOSIS — Z23 Encounter for immunization: Secondary | ICD-10-CM | POA: Diagnosis not present

## 2016-08-28 ENCOUNTER — Ambulatory Visit: Payer: Medicare Other | Attending: Specialist

## 2016-08-28 DIAGNOSIS — M25511 Pain in right shoulder: Secondary | ICD-10-CM

## 2016-08-28 DIAGNOSIS — R293 Abnormal posture: Secondary | ICD-10-CM

## 2016-08-28 DIAGNOSIS — M6281 Muscle weakness (generalized): Secondary | ICD-10-CM | POA: Diagnosis not present

## 2016-08-28 NOTE — Patient Instructions (Addendum)
KEEP HEAD IN NEUTRAL AND SHOULDERS DOWN AND RELAXED   Hold tubing in right hand, arm forward. Pull arm back, elbow straight.  Both arms at the same time. Repeat __10__ times per set. Do __2__ sets per session. Do _1-2___ sessions per day.  Copyright  VHI. All rights reserved.     With resistive band anchored in door, grasp both ends. Keeping elbows bent, pull back, squeezing shoulder blades together. Hold _3__ seconds. Repeat _2x10___ times. Do _1-2___ sessions per day.  http://gt2.exer.us/98   Hold all stretches 5-10 seconds and perform 5-10 times, 3 times a day.   Slide right arm up wall, with  PALM FACING THE WALL, by leaning toward wall.      Clasp hands together and raise arms above head, keeping elbows as straight as possible. Can be done sitting or lying.   Copyright  VHI. All rights reserved.  [facility] [address], [place], [place] [address] Phone # [phone] Fax [phone]

## 2016-08-28 NOTE — Therapy (Signed)
[facility] [address], [place], [place], [address] Phone: [phone]   Fax:  [phone]  Physical Therapy Evaluation  Patient Details  Name: Stacy Valencia MRN: [id number] Date of Birth: [birth year] Referring Provider: Cynda Familia, MD  Encounter Date: 08/28/2016      PT End of Session - 08/28/16 1023    Visit Number 1   Number of Visits 10   Date for PT Re-Evaluation 10/23/16   PT Start Time 0932   PT Stop Time 1013   PT Time Calculation (min) 41 min   Activity Tolerance Patient tolerated treatment well   Behavior During Therapy [facility] for tasks assessed/performed      Past Medical History:  Diagnosis Date  . GERD (gastroesophageal reflux disease)   . PE (pulmonary embolism) 2005    Past Surgical History:  Procedure Laterality Date  . ABDOMINAL HYSTERECTOMY    . APPENDECTOMY      There were no vitals filed for this visit.       Subjective Assessment - 08/28/16 0932    Subjective Pt is a Rt hand dominant female who presents to PT with Rt shoulder pain lasting ~3 months without cause.     Pertinent History injection into Rt shoulder- 3 weeks ago   Diagnostic tests x-ray: OA of Rt shoulder   Patient Stated Goals reduce shoulder pain   Currently in Pain? Yes   Pain Score 7   only when using the arm- brief   Pain Location Shoulder   Pain Orientation Right   Pain Type Chronic pain   Pain Onset More than a month ago   Pain Frequency Intermittent   Aggravating Factors  only when using the Lt UE, coming back down from reaching up   Pain Relieving Factors rest, not using the Rt UE            [facility] PT Assessment - 08/28/16 0001      Assessment   Medical Diagnosis Rotator cuff tendinitis, localized arthritis of Rt shoulder   Referring Provider Cynda Familia, MD   Onset Date/Surgical Date 05/28/16   Hand Dominance Right   Next MD Visit 1 month   Prior Therapy none     Precautions   Precautions None     Restrictions   Weight Bearing Restrictions No     Balance Screen   Has the patient fallen in the past 6 months No   Has the patient had a decrease in activity level because of a fear of falling?  No   Is the patient reluctant to leave their home because of a fear of falling?  No     Home Environment   Living Environment Private residence   Type of [place]     Prior Function   Level of [place] Retired   Leisure Engineer, manufacturing systems   Overall Cognitive Status Within Functional Limits for tasks assessed     Observation/Other Assessments   Focus on Therapeutic Outcomes (FOTO)  45% limitation     Posture/Postural Control   Posture/Postural Control Postural limitations   Postural Limitations Forward head;Rounded Shoulders     ROM / Strength   AROM / PROM / Strength AROM;PROM;Strength     AROM   Overall AROM  Deficits   Overall AROM Comments Rt=Lt shoulder AROM except Rt IR limited by 5 inches vs the LT=Lt     PROM   Overall PROM  Deficits  Overall PROM Comments Lt shoulder AROM is full without pain   PROM Assessment Site Shoulder   Right/Left Shoulder Right   Right Shoulder Flexion 153 Degrees   Right Shoulder ABduction 100 Degrees   Right Shoulder Internal Rotation 62 Degrees   Right Shoulder External Rotation 64 Degrees     Strength   Overall Strength Deficits   Overall Strength Comments 4+/5 bil shoulder strength throughout except Rt shoulder abduction 4-/5     Palpation   Palpation comment palpable tenderness over Rt anterior glenohumeral joint at proximal biceps insertion and anterior deltoid.  Reduced inferior and posterior glide of humeral head.       Ambulation/Gait   Ambulation/Gait Yes   Gait Pattern Within Functional Limits                           PT Education - 08/28/16 1001    Education provided Yes   Education Details shoulder AAROM, scapular strength    Person(s) Educated Patient   Methods Explanation;Demonstration;Handout   Comprehension Verbalized understanding;Returned demonstration          PT Short Term Goals - 08/28/16 1020      PT SHORT TERM GOAL #1   Title be independent in initial HEP   Time 4   Period Weeks   Status New     PT SHORT TERM GOAL #2   Title verbalize and demonstrate correct posture and report corrections at home    Time 4   Period Weeks   Status New     PT SHORT TERM GOAL #3   Title report a 30% reduction in Rt shoulder pain with reaching overhead   Time 4   Period Weeks   Status New           PT Long Term Goals - 08/28/16 [id number]      PT LONG TERM GOAL #1   Title be independent in advanced HEP   Time 8   Period Weeks   Status New     PT LONG TERM GOAL #2   Title reduce FOTO to < or = to 36% limitation   Time 8   Period Weeks   Status New     PT LONG TERM GOAL #3   Title report < or = to 3/10 Rt shoulder pain with use for self-care and ADLs   Time 8   Period Weeks   Status New     PT LONG TERM GOAL #4   Title demonstrate Rt shoulder IR AROM to lacking < or = to 2 inches vs the Lt to improve self-care   Time 8   Period Weeks   Status New               Plan - 08/28/16 1006    Clinical Impression Statement Pt presents to PT with ~3 month history of Rt shoulder pain without incident or injury.  Pt had recent x-ray indicating OA in the Rt glenohumeral joint.  Pt has had recent injection into the Rt shoulder which has relieved the pain.  Pt reports 7/10 brief pain only with use of Lt UE especially with eccentric motion.  Pt demonstrates Rt=Lt glenohumeral joint AROM except IR is limited by 5 inches vs the Lt.  Pt will benefit from skilled PT for Rt shoulder flexiblity, strength, postural reeducation and modalites for pain.     Rehab Potential Good   PT Frequency 2x / week   PT  Duration 8 weeks   PT Treatment/Interventions ADLs/Self Care Home Management;Cryotherapy;Electrical  Stimulation;Ultrasound;Moist Heat;Therapeutic activities;Therapeutic exercise;Neuromuscular re-education;Patient/family education;Passive range of motion;Manual techniques;Dry needling;Taping   PT Next Visit Plan postural strength/scapular strength, Rt shoulder AROM, ultrasound, manual and modalities   Consulted and Agree with Plan of Care Patient      Patient will benefit from skilled therapeutic intervention in order to improve the following deficits and impairments:  Decreased range of motion, Pain, Postural dysfunction, Decreased strength, Impaired flexibility, Decreased activity tolerance  Visit Diagnosis: Pain in right shoulder - Plan: PT plan of care cert/re-cert  Muscle weakness (generalized) - Plan: PT plan of care cert/re-cert  Abnormal posture - Plan: PT plan of care cert/re-cert      G-Codes - [id number]    Functional Assessment Tool Used FOTO: 45% limitation   Functional Limitation Other PT primary   Other PT Primary Current Status [id number]) At least 40 percent but less than 60 percent impaired, limited or restricted   Other PT Primary Goal Status [id number]) At least 20 percent but less than 40 percent impaired, limited or restricted       Problem List Patient Active Problem List   Diagnosis Date Noted  . DVT (deep venous thrombosis) ([place]) 01/07/2013  . Pulmonary embolism ([place]) 01/05/2013  . GERD (gastroesophageal reflux disease) 01/05/2013     Sigurd Sos, PT 08/28/16 10:25 AM  [facility] [address], [place], [place], [address] Phone: [phone]   Fax:  [phone]  Name: Stacy Valencia MRN: [id number] Date of Birth: 1933-12-22

## 2016-09-02 ENCOUNTER — Ambulatory Visit: Payer: Medicare Other | Admitting: Physical Therapy

## 2016-09-02 ENCOUNTER — Encounter: Payer: Self-pay | Admitting: Physical Therapy

## 2016-09-02 DIAGNOSIS — M6281 Muscle weakness (generalized): Secondary | ICD-10-CM | POA: Diagnosis not present

## 2016-09-02 DIAGNOSIS — M25511 Pain in right shoulder: Secondary | ICD-10-CM | POA: Diagnosis not present

## 2016-09-02 DIAGNOSIS — R293 Abnormal posture: Secondary | ICD-10-CM | POA: Diagnosis not present

## 2016-09-02 NOTE — Therapy (Signed)
[facility] [address] [place], [place], [address] Phone: [phone]   Fax:  [phone]  Physical Therapy Treatment  Patient Details  Name: Stacy Valencia MRN: [id number] Date of Birth: [birth year] Referring Provider: Cynda Familia, MD  Encounter Date: 09/02/2016      PT End of Session - 09/02/16 1443    Visit Number 2   Number of Visits 10   Date for PT Re-Evaluation 10/23/16   PT Start Time 1400   PT Stop Time 1443   PT Time Calculation (min) 43 min   Activity Tolerance Patient tolerated treatment well   Behavior During Therapy [facility] for tasks assessed/performed      Past Medical History:  Diagnosis Date  . GERD (gastroesophageal reflux disease)   . PE (pulmonary embolism) 2005    Past Surgical History:  Procedure Laterality Date  . ABDOMINAL HYSTERECTOMY    . APPENDECTOMY      There were no vitals filed for this visit.      Subjective Assessment - 09/02/16 1405    Subjective My right shoulder feels good.  I feel better since I had the cortisone shot.    Pertinent History injection into Rt shoulder- 3 weeks ago   Diagnostic tests x-ray: OA of Rt shoulder   Patient Stated Goals reduce shoulder pain   Currently in Pain? No/denies   Multiple Pain Sites No            [facility] PT Assessment - 09/02/16 0001      AROM   Overall AROM Comments right shoulder flexion 141, abduction 155 with scaption                     [facility] Adult PT Treatment/Exercise - 09/02/16 0001      Shoulder Exercises: Supine   Horizontal ABduction AAROM;Strengthening;Right;15 reps  2 sets    External Rotation AAROM;Strengthening;Right;20 reps   Internal Rotation AAROM;Strengthening;Right;20 reps   Flexion AAROM;Strengthening;Right;20 reps     Shoulder Exercises: Seated   External Rotation Both;Strengthening;10 reps  keep arms at side     Shoulder Exercises: Sidelying   Other Sidelying Exercises  right scapular diagonals AAROM and up/down movement with assistance     Manual Therapy   Manual Therapy Joint mobilization;Soft tissue mobilization   Joint Mobilization distraction, inferior glide, posterior glide, grade 3   Soft tissue mobilization right A-C joint, right subscapularis                PT Education - 09/02/16 1443    Education provided No          PT Short Term Goals - 09/02/16 1446      PT SHORT TERM GOAL #1   Title be independent in initial HEP   Time 4   Period Weeks   Status New     PT SHORT TERM GOAL #2   Title verbalize and demonstrate correct posture and report corrections at home    Period Weeks   Status New     PT SHORT TERM GOAL #3   Title report a 30% reduction in Rt shoulder pain with reaching overhead   Time 4   Period Weeks   Status New           PT Long Term Goals - 08/28/16 [id number]      PT LONG TERM GOAL #1   Title be independent in advanced HEP   Time 8   Period Weeks   Status  New     PT LONG TERM GOAL #2   Title reduce FOTO to < or = to 36% limitation   Time 8   Period Weeks   Status New     PT LONG TERM GOAL #3   Title report < or = to 3/10 Rt shoulder pain with use for self-care and ADLs   Time 8   Period Weeks   Status New     PT LONG TERM GOAL #4   Title demonstrate Rt shoulder IR AROM to lacking < or = to 2 inches vs the Lt to improve self-care   Time 8   Period Weeks   Status New               Plan - 09/02/16 1443    Clinical Impression Statement Patient had increased right shoulder abduction AROM after therapy.  Patient has trigger points in right subscapularis and pain in right a-c joint.  Decreased mobility of right scapula.  Patient only has pain at end range of shoulder flexion and abduction.  Patient will benefit from skilled therapy to improve mobility and strength of right shoulder.    Rehab Potential Good   Clinical Impairments Affecting Rehab Potential None   PT Frequency 2x / week    PT Duration 8 weeks   PT Treatment/Interventions ADLs/Self Care Home Management;Cryotherapy;Electrical Stimulation;Ultrasound;Moist Heat;Therapeutic activities;Therapeutic exercise;Neuromuscular re-education;Patient/family education;Passive range of motion;Manual techniques;Dry needling;Taping   PT Next Visit Plan postural strength/scapular strength, Rt shoulder AROM, ultrasound, manual and modalities   PT Home Exercise Plan shoulder flexion and abduction; manual mobilizatoin, soft tissue work   Recommended Other Services None   Consulted and Agree with Plan of Care Patient      Patient will benefit from skilled therapeutic intervention in order to improve the following deficits and impairments:  Decreased range of motion, Pain, Postural dysfunction, Decreased strength, Impaired flexibility, Decreased activity tolerance  Visit Diagnosis: Pain in right shoulder  Muscle weakness (generalized)  Abnormal posture     Problem List Patient Active Problem List   Diagnosis Date Noted  . DVT (deep venous thrombosis) ([place]) 01/07/2013  . Pulmonary embolism ([place]) 01/05/2013  . GERD (gastroesophageal reflux disease) 01/05/2013    Earlie Counts, PT 09/02/16 2:47 PM   [facility] [address], [place], [place], [address] Phone: [phone]   Fax:  [phone]  Name: Stacy Valencia MRN: [id number] Date of Birth: 12-11-1933

## 2016-09-04 ENCOUNTER — Ambulatory Visit: Payer: Medicare Other | Admitting: Physical Therapy

## 2016-09-04 ENCOUNTER — Encounter: Payer: Self-pay | Admitting: Physical Therapy

## 2016-09-04 DIAGNOSIS — M6281 Muscle weakness (generalized): Secondary | ICD-10-CM

## 2016-09-04 DIAGNOSIS — M25511 Pain in right shoulder: Secondary | ICD-10-CM | POA: Diagnosis not present

## 2016-09-04 DIAGNOSIS — R293 Abnormal posture: Secondary | ICD-10-CM | POA: Diagnosis not present

## 2016-09-04 NOTE — Therapy (Signed)
[facility] [address] [place], [place], [address] Phone: [phone]   Fax:  [phone]  Physical Therapy Treatment  Patient Details  Name: Stacy Valencia MRN: [id number] Date of Birth: 1933-05-23 Referring Provider: Cynda Familia, MD  Encounter Date: 09/04/2016      PT End of Session - 09/04/16 1409    Visit Number 3   Number of Visits 10   Date for PT Re-Evaluation 10/23/16   Authorization Type medicare g-code 10th visit   PT Start Time 1400   PT Stop Time 1440   PT Time Calculation (min) 40 min   Activity Tolerance Patient tolerated treatment well   Behavior During Therapy [facility] for tasks assessed/performed      Past Medical History:  Diagnosis Date  . GERD (gastroesophageal reflux disease)   . PE (pulmonary embolism) 2005    Past Surgical History:  Procedure Laterality Date  . ABDOMINAL HYSTERECTOMY    . APPENDECTOMY      There were no vitals filed for this visit.      Subjective Assessment - 09/04/16 1408    Subjective my shoulder feels good.    Pertinent History injection into Rt shoulder- 3 weeks ago   Diagnostic tests x-ray: OA of Rt shoulder   Patient Stated Goals reduce shoulder pain   Currently in Pain? No/denies                         [facility] Adult PT Treatment/Exercise - 09/04/16 0001      Shoulder Exercises: Supine   Horizontal ABduction Strengthening;Both;10 reps;Theraband  2 sets    Theraband Level (Shoulder Horizontal ABduction) Level 1 (Yellow)   Horizontal ABduction Limitations right shoulder flexion yellow band 10 time    External Rotation AAROM;Strengthening;Right;20 reps   Internal Rotation AAROM;Strengthening;Right;20 reps   Flexion AAROM;Strengthening;Right;20 reps   ABduction AAROM;Strengthening;Right;10 reps     Shoulder Exercises: Seated   Flexion AAROM;Strengthening;Both;10 reps  using a cane     Shoulder Exercises: Standing   ABduction Strengthening;Both;10 reps  with cane   Extension Strengthening;Both;10 reps  with cand   Other Standing Exercises press right hand into wall moving up/down, side to side, cc/cw 10x each;    Other Standing Exercises hold cane go to 90 degrees, puch cane out then down 10x     Shoulder Exercises: ROM/Strengthening   UBE (Upper Arm Bike) 3 min level 0 forward only  backward hurt   Other ROM/Strengthening Exercises wall walking flexion 10x     Manual Therapy   Manual Therapy Joint mobilization;Soft tissue mobilization   Joint Mobilization distraction, inferior glide, posterior glide, grade 3   Soft tissue mobilization right A-C joint, right subscapularis                PT Education - 09/04/16 1651    Education provided No          PT Short Term Goals - 09/02/16 1446      PT SHORT TERM GOAL #1   Title be independent in initial HEP   Time 4   Period Weeks   Status New     PT SHORT TERM GOAL #2   Title verbalize and demonstrate correct posture and report corrections at home    Period Weeks   Status New     PT SHORT TERM GOAL #3   Title report a 30% reduction in Rt shoulder pain with reaching overhead   Time 4  Period Weeks   Status New           PT Long Term Goals - 08/28/16 [id number]      PT LONG TERM GOAL #1   Title be independent in advanced HEP   Time 8   Period Weeks   Status New     PT LONG TERM GOAL #2   Title reduce FOTO to < or = to 36% limitation   Time 8   Period Weeks   Status New     PT LONG TERM GOAL #3   Title report < or = to 3/10 Rt shoulder pain with use for self-care and ADLs   Time 8   Period Weeks   Status New     PT LONG TERM GOAL #4   Title demonstrate Rt shoulder IR AROM to lacking < or = to 2 inches vs the Lt to improve self-care   Time 8   Period Weeks   Status New               Plan - 09/04/16 1651    Clinical Impression Statement Patient pain has decreased.  She has pain only at end range of  motion.  Patient is able to lift a cane overhead with greater ease.  Patient has started stabilization exercises in standing.  Patient continues to have tenderness located on righ A-C joint.  Patient has decreased mobility of thoracic spine making it difficult for full shoulder flexion. Patient will benefit from skilled therapy to improve ROM and strength.    Rehab Potential Good   Clinical Impairments Affecting Rehab Potential None   PT Frequency 2x / week   PT Duration 8 weeks   PT Treatment/Interventions ADLs/Self Care Home Management;Cryotherapy;Electrical Stimulation;Ultrasound;Moist Heat;Therapeutic activities;Therapeutic exercise;Neuromuscular re-education;Patient/family education;Passive range of motion;Manual techniques;Dry needling;Taping   PT Next Visit Plan postural strength/scapular strength, Rt shoulder AROM, stabilization exercises   PT Home Exercise Plan progress as needed      Patient will benefit from skilled therapeutic intervention in order to improve the following deficits and impairments:  Decreased range of motion, Pain, Postural dysfunction, Decreased strength, Impaired flexibility, Decreased activity tolerance  Visit Diagnosis: Pain in right shoulder  Muscle weakness (generalized)  Abnormal posture     Problem List Patient Active Problem List   Diagnosis Date Noted  . DVT (deep venous thrombosis) ([place]) 01/07/2013  . Pulmonary embolism ([place]) 01/05/2013  . GERD (gastroesophageal reflux disease) 01/05/2013    Stacy Valencia 09/04/2016, 4:55 PM  [facility] [address] [place], [place], [address] Phone: [phone]   Fax:  [phone]  Name: Stacy Valencia MRN: [id number] Date of Birth: 1933/01/01

## 2016-09-09 ENCOUNTER — Ambulatory Visit: Payer: Medicare Other | Admitting: Physical Therapy

## 2016-09-09 DIAGNOSIS — M6281 Muscle weakness (generalized): Secondary | ICD-10-CM

## 2016-09-09 DIAGNOSIS — M25511 Pain in right shoulder: Secondary | ICD-10-CM

## 2016-09-09 DIAGNOSIS — R293 Abnormal posture: Secondary | ICD-10-CM

## 2016-09-09 NOTE — Therapy (Signed)
[facility] [address] [place], [place], [address] Phone: [phone]   Fax:  [phone]  Physical Therapy Treatment  Patient Details  Name: Stacy Valencia MRN: [id number] Date of Birth: 1933-09-10 Referring Provider: Cynda Familia, MD  Encounter Date: 09/09/2016      PT End of Session - 09/09/16 1021    Visit Number 4   Number of Visits 10   Date for PT Re-Evaluation 10/23/16   Authorization Type medicare g-code 10th visit   PT Start Time 1017   PT Stop Time 1100   PT Time Calculation (min) 43 min   Activity Tolerance Patient tolerated treatment well   Behavior During Therapy [facility] for tasks assessed/performed      Past Medical History:  Diagnosis Date  . GERD (gastroesophageal reflux disease)   . PE (pulmonary embolism) 2005    Past Surgical History:  Procedure Laterality Date  . ABDOMINAL HYSTERECTOMY    . APPENDECTOMY      There were no vitals filed for this visit.      Subjective Assessment - 09/09/16 1019    Subjective Shoulder feeling a little better than is has been.   Pertinent History injection into Rt shoulder- 3 weeks ago   Diagnostic tests x-ray: OA of Rt shoulder   Patient Stated Goals reduce shoulder pain   Currently in Pain? Yes   Pain Score 4    Pain Orientation Right                         [facility] Adult PT Treatment/Exercise - 09/09/16 0001      Shoulder Exercises: Supine   Horizontal ABduction Strengthening;Both;10 reps;Theraband  verbal cues for position   Theraband Level (Shoulder Horizontal ABduction) Level 1 (Yellow)   External Rotation AAROM;Strengthening;Right;20 reps   Flexion AAROM;Strengthening;Right;20 reps   Other Supine Exercises Manual rhythmic stabilization  90/90 resistance at wrist     Shoulder Exercises: Standing   ABduction Strengthening;Both;10 reps  with cane   Extension Strengthening;Both;10 reps  with cand                   PT Short Term Goals - 09/09/16 1020      PT SHORT TERM GOAL #1   Title be independent in initial HEP   Time 4   Period Weeks   Status On-going     PT SHORT TERM GOAL #2   Title verbalize and demonstrate correct posture and report corrections at home    Time 4   Period Weeks   Status On-going     PT SHORT TERM GOAL #3   Title report a 30% reduction in Rt shoulder pain with reaching overhead   Time 4   Period Weeks   Status On-going           PT Long Term Goals - 09/09/16 1020      PT LONG TERM GOAL #1   Title be independent in advanced HEP   Time 8   Period Weeks   Status On-going     PT LONG TERM GOAL #2   Title reduce FOTO to < or = to 36% limitation   Time 8   Period Weeks   Status On-going     PT LONG TERM GOAL #3   Title report < or = to 3/10 Rt shoulder pain with use for self-care and ADLs   Time 8   Period Weeks   Status On-going  PT LONG TERM GOAL #4   Title demonstrate Rt shoulder IR AROM to lacking < or = to 2 inches vs the Lt to improve self-care   Time 8   Period Weeks   Status On-going               Plan - 09/09/16 1101    Clinical Impression Statement Pt reports shoulder feeling better than it has been but stil painful. Crunching and pain in shoulder wtih flexion and some with abduction. Able to tolerate all isometric exercises. Tightnes in Rt anterior shoulder, decreased with soft tissue massage. Will continue to increase shoulder strength and stability and decrease pain.     Rehab Potential Good   Clinical Impairments Affecting Rehab Potential None   PT Frequency 2x / week   PT Duration 8 weeks   PT Treatment/Interventions ADLs/Self Care Home Management;Cryotherapy;Electrical Stimulation;Ultrasound;Moist Heat;Therapeutic activities;Therapeutic exercise;Neuromuscular re-education;Patient/family education;Passive range of motion;Manual techniques;Dry needling;Taping   PT Next Visit Plan Posture strength/  scapluar strenght. Isometric stabilization. Chest stretch   Consulted and Agree with Plan of Care Patient      Patient will benefit from skilled therapeutic intervention in order to improve the following deficits and impairments:  Decreased range of motion, Pain, Postural dysfunction, Decreased strength, Impaired flexibility, Decreased activity tolerance  Visit Diagnosis: Pain in right shoulder  Muscle weakness (generalized)  Abnormal posture     Problem List Patient Active Problem List   Diagnosis Date Noted  . DVT (deep venous thrombosis) ([place]) 01/07/2013  . Pulmonary embolism ([place]) 01/05/2013  . GERD (gastroesophageal reflux disease) 01/05/2013    [name] 09/09/2016, 11:31 AM  [facility] [address] [place], [place], [address] Phone: [phone]   Fax:  [phone]  Name: Stacy Valencia MRN: [id number] Date of Birth: [birth year]

## 2016-09-11 ENCOUNTER — Ambulatory Visit: Payer: Medicare Other

## 2016-09-11 DIAGNOSIS — R293 Abnormal posture: Secondary | ICD-10-CM | POA: Diagnosis not present

## 2016-09-11 DIAGNOSIS — M6281 Muscle weakness (generalized): Secondary | ICD-10-CM

## 2016-09-11 DIAGNOSIS — M25511 Pain in right shoulder: Secondary | ICD-10-CM

## 2016-09-11 NOTE — Therapy (Signed)
[facility] [address] [place], [place], [address] Phone: [phone]   Fax:  [phone]  Physical Therapy Treatment  Patient Details  Name: Stacy Valencia MRN: [id number] Date of Birth: 08-26-1933 Referring Provider: Cynda Familia, MD  Encounter Date: 09/11/2016      PT End of Session - 09/11/16 1257    Visit Number 5   Number of Visits 10   Date for PT Re-Evaluation 10/23/16   Authorization Type medicare g-code 10th visit   PT Start Time 1228   PT Stop Time [id number]  limited by LBP today   PT Time Calculation (min) 28 min   Activity Tolerance Patient tolerated treatment well;Patient limited by pain   Behavior During Therapy [facility] for tasks assessed/performed      Past Medical History:  Diagnosis Date  . GERD (gastroesophageal reflux disease)   . PE (pulmonary embolism) 2005    Past Surgical History:  Procedure Laterality Date  . ABDOMINAL HYSTERECTOMY    . APPENDECTOMY      There were no vitals filed for this visit.      Subjective Assessment - 09/11/16 1233    Subjective My Rt shoulder is feeling better.  80% overall improvement in symptoms since the start of care.     Pertinent History injection into Rt shoulder- 3 weeks ago.  Pt with lumbar spasms with standing activity and LBP in supine   Currently in Pain? No/denies            [facility] PT Assessment - 09/11/16 0001      AROM   Overall AROM  Deficits   Overall AROM Comments IR limited by 5" vs the Lt                     [facility] Adult PT Treatment/Exercise - 09/11/16 0001      Shoulder Exercises: Seated   Row Strengthening;Both;20 reps;Theraband   Theraband Level (Shoulder Row) Level 1 (Yellow)   Horizontal ABduction Strengthening;Both;20 reps;Theraband   Theraband Level (Shoulder Horizontal ABduction) Level 1 (Yellow)   Flexion AROM;Strengthening;Right;20 reps     Shoulder Exercises: Standing   Flexion  Strengthening;Right;5 reps   Flexion Limitations limited due to lumbar spasms     Shoulder Exercises: Pulleys   Flexion 3 minutes     Shoulder Exercises: ROM/Strengthening   UBE (Upper Arm Bike) 6 min level 0 forward and reverse  backward hurt                  PT Short Term Goals - 09/11/16 1233      PT SHORT TERM GOAL #1   Title be independent in initial HEP   Status Achieved     PT SHORT TERM GOAL #2   Title verbalize and demonstrate correct posture and report corrections at home    Status Achieved     PT SHORT TERM GOAL #3   Title report a 30% reduction in Rt shoulder pain with reaching overhead   Status Achieved           PT Long Term Goals - 09/11/16 1234      PT LONG TERM GOAL #1   Title be independent in advanced HEP   Time 8   Period Weeks   Status On-going     PT LONG TERM GOAL #2   Title reduce FOTO to < or = to 36% limitation   Time 8   Period Weeks   Status On-going  Plan - 09/11/16 1235    Clinical Impression Statement Pt reports 80% overall improvement in Rt shoulder symptoms since the start of care.  Pt with improved Rt shoulder AROM against gravity.  Pt is working to improve posture and is making corrections at home.  Pt with spasms in the lumbar spine in standing and reports that supine position increased pain so this limited activity today.   Pt will continue to benefit from skilled PT for Rt shoulder strength, postural strength and flexibility.     Rehab Potential Good   PT Frequency 2x / week   PT Duration 8 weeks   PT Treatment/Interventions ADLs/Self Care Home Management;Cryotherapy;Electrical Stimulation;Ultrasound;Moist Heat;Therapeutic activities;Therapeutic exercise;Neuromuscular re-education;Patient/family education;Passive range of motion;Manual techniques;Dry needling;Taping   Consulted and Agree with Plan of Care Patient      Patient will benefit from skilled therapeutic intervention in order to  improve the following deficits and impairments:  Decreased range of motion, Pain, Postural dysfunction, Decreased strength, Impaired flexibility, Decreased activity tolerance  Visit Diagnosis: Pain in right shoulder  Muscle weakness (generalized)  Abnormal posture     Problem List Patient Active Problem List   Diagnosis Date Noted  . DVT (deep venous thrombosis) ([place]) 01/07/2013  . Pulmonary embolism ([place]) 01/05/2013  . GERD (gastroesophageal reflux disease) 01/05/2013    Sigurd Sos, PT 09/11/16 1:00 PM  [facility] [address] [place], [place], [address] Phone: [phone]   Fax:  [phone]  Name: Stacy Valencia MRN: [id number] Date of Birth: May 16, 1933

## 2016-09-16 ENCOUNTER — Ambulatory Visit: Payer: Medicare Other

## 2016-09-16 DIAGNOSIS — M25511 Pain in right shoulder: Secondary | ICD-10-CM

## 2016-09-16 DIAGNOSIS — R293 Abnormal posture: Secondary | ICD-10-CM | POA: Diagnosis not present

## 2016-09-16 DIAGNOSIS — M6281 Muscle weakness (generalized): Secondary | ICD-10-CM

## 2016-09-16 NOTE — Therapy (Signed)
[facility] [address] [place], [place], [address] Phone: [phone]   Fax:  [phone]  Physical Therapy Treatment  Patient Details  Name: Crissie Leos MRN: [id number] Date of Birth: October 19, 1933 Referring Provider: Cynda Familia, MD  Encounter Date: 09/16/2016      PT End of Session - 09/16/16 1209    Visit Number 6   Number of Visits 10   Date for PT Re-Evaluation 10/23/16   Authorization Type medicare g-code 10th visit   PT Start Time 1143   PT Stop Time 1212  LBP, not able to stand or lay down   PT Time Calculation (min) 29 min   Activity Tolerance Patient tolerated treatment well   Behavior During Therapy [facility] for tasks assessed/performed      Past Medical History:  Diagnosis Date  . GERD (gastroesophageal reflux disease)   . PE (pulmonary embolism) 2005    Past Surgical History:  Procedure Laterality Date  . ABDOMINAL HYSTERECTOMY    . APPENDECTOMY      There were no vitals filed for this visit.      Subjective Assessment - 09/16/16 1140    Subjective Rt shoulder is feeling better.  A little bit sore today.    Pertinent History injection into Rt shoulder- 3 weeks ago.  Pt with lumbar spasms with standing activity and LBP in supine   Currently in Pain? Yes   Pain Score 3    Pain Location Shoulder   Pain Orientation Right   Pain Descriptors / Indicators Sore   Pain Type Acute pain   Pain Onset More than a month ago   Pain Frequency Intermittent   Aggravating Factors  only when using the Rt UE, coming back down from reaching up   Pain Relieving Factors rest, not using Rt UE                         [facility] Adult PT Treatment/Exercise - 09/16/16 0001      Shoulder Exercises: Seated   Row Strengthening;Both;20 reps;Theraband   Theraband Level (Shoulder Row) Level 1 (Yellow)   Horizontal ABduction Strengthening;Both;20 reps;Theraband   Theraband Level (Shoulder  Horizontal ABduction) Level 1 (Yellow)   Flexion AROM;Strengthening;Right;20 reps   Abduction AAROM;Right;20 reps   ABduction Limitations scaption 2x10   Other Seated Exercises biceps curls: 4# 2x10     Shoulder Exercises: Standing   Flexion Strengthening;Right;5 reps     Shoulder Exercises: Pulleys   Flexion 3 minutes     Shoulder Exercises: ROM/Strengthening   UBE (Upper Arm Bike) 6 min level 0 forward only  backward hurt                  PT Short Term Goals - 09/16/16 1150      PT SHORT TERM GOAL #1   Title be independent in initial HEP   Status Achieved     PT SHORT TERM GOAL #2   Title verbalize and demonstrate correct posture and report corrections at home    Status Achieved     PT SHORT TERM GOAL #3   Title report a 30% reduction in Rt shoulder pain with reaching overhead   Status Achieved           PT Long Term Goals - 09/16/16 1150      PT LONG TERM GOAL #1   Title be independent in advanced HEP   Time 8   Period Weeks  Status On-going     PT LONG TERM GOAL #2   Title reduce FOTO to < or = to 36% limitation   Time 8   Period Weeks   Status On-going     PT LONG TERM GOAL #3   Title report < or = to 3/10 Rt shoulder pain with use for self-care and ADLs   Time 8   Period Weeks   Status On-going     PT LONG TERM GOAL #4   Title demonstrate Rt shoulder IR AROM to lacking < or = to 2 inches vs the Lt to improve self-care   Time 8   Period Weeks   Status On-going               Plan - 09/16/16 1151    Clinical Impression Statement Pt reports 80% overall improvement in Rt shoulder symptoms since the start of care.  Pt is working to improve posture and is making corrections at home.  Pt with spasms in the lumbare spine in standing and reports that supine postion incrased pain.  Pt will continue to benefit from skilled PT for Rt shoulder strength, postural strength and flexiblity.     Rehab Potential Good   PT Frequency 2x / week    PT Duration 8 weeks   PT Treatment/Interventions ADLs/Self Care Home Management;Cryotherapy;Electrical Stimulation;Ultrasound;Moist Heat;Therapeutic activities;Therapeutic exercise;Neuromuscular re-education;Patient/family education;Passive range of motion;Manual techniques;Dry needling;Taping   PT Next Visit Plan Posture strength/ scapluar strength.     Consulted and Agree with Plan of Care Patient      Patient will benefit from skilled therapeutic intervention in order to improve the following deficits and impairments:  Decreased range of motion, Pain, Postural dysfunction, Decreased strength, Impaired flexibility, Decreased activity tolerance  Visit Diagnosis: Pain in right shoulder  Muscle weakness (generalized)  Abnormal posture     Problem List Patient Active Problem List   Diagnosis Date Noted  . DVT (deep venous thrombosis) ([place]) 01/07/2013  . Pulmonary embolism ([place]) 01/05/2013  . GERD (gastroesophageal reflux disease) 01/05/2013     Sigurd Sos, PT 09/16/16 12:20 PM  [facility] [address] [place], [place], [address] Phone: [phone]   Fax:  [phone]  Name: Chisato Hatzis MRN: [id number] Date of Birth: 1933-02-03

## 2016-09-18 ENCOUNTER — Ambulatory Visit: Payer: Medicare Other | Admitting: Physical Therapy

## 2016-09-18 ENCOUNTER — Encounter: Payer: Self-pay | Admitting: Physical Therapy

## 2016-09-18 DIAGNOSIS — R293 Abnormal posture: Secondary | ICD-10-CM | POA: Diagnosis not present

## 2016-09-18 DIAGNOSIS — M6281 Muscle weakness (generalized): Secondary | ICD-10-CM

## 2016-09-18 DIAGNOSIS — M25511 Pain in right shoulder: Secondary | ICD-10-CM | POA: Diagnosis not present

## 2016-09-18 NOTE — Therapy (Signed)
[facility] [address] [place], [place], [address] Phone: [phone]   Fax:  [phone]  Physical Therapy Treatment  Patient Details  Name: Stacy Valencia MRN: [id number] Date of Birth: 18-Jun-1933 Referring Provider: Cynda Familia, MD  Encounter Date: 09/18/2016      PT End of Session - 09/18/16 1234    Visit Number 7   Number of Visits 10   Date for PT Re-Evaluation 10/23/16   Authorization Type medicare g-code 10th visit   PT Start Time 1230   PT Stop Time 1311   PT Time Calculation (min) 41 min   Activity Tolerance Patient tolerated treatment well   Behavior During Therapy [facility] for tasks assessed/performed      Past Medical History:  Diagnosis Date  . GERD (gastroesophageal reflux disease)   . PE (pulmonary embolism) 2005    Past Surgical History:  Procedure Laterality Date  . ABDOMINAL HYSTERECTOMY    . APPENDECTOMY      There were no vitals filed for this visit.      Subjective Assessment - 09/18/16 1233    Subjective Pt reports shoulder hurting today. Pt stated shes probably just using it too much. Pt stated sometimes shoulder pain radiates down to hand. Increased back pain today as well.    Pertinent History injection into Rt shoulder- 3 weeks ago.  Pt with lumbar spasms with standing activity and LBP in supine   Diagnostic tests x-ray: OA of Rt shoulder   Patient Stated Goals reduce shoulder pain   Currently in Pain? Yes   Pain Score 7    Pain Location Shoulder   Pain Orientation Right   Pain Descriptors / Indicators Sore   Pain Type Acute pain                         [facility] Adult PT Treatment/Exercise - 09/18/16 0001      Shoulder Exercises: Seated   Extension Strengthening;Right;20 reps;Theraband   Theraband Level (Shoulder Extension) Level 2 (Red)   Row Strengthening;Both;20 reps;Theraband   Theraband Level (Shoulder Row) Level 2 (Red)   Horizontal  ABduction Strengthening;Both;20 reps;Theraband   Theraband Level (Shoulder Horizontal ABduction) Level 2 (Red)   External Rotation Strengthening;Both;20 reps   Theraband Level (Shoulder External Rotation) Level 2 (Red)   Flexion Strengthening;Right;10 reps  #1   Abduction Strengthening;Right;10 reps  #1   ABduction Limitations scaption 2x10   Other Seated Exercises D1 flexion/ extension  red tband 10x2     Shoulder Exercises: ROM/Strengthening   UBE (Upper Arm Bike) 6 min level 0 forward only  backward hurt                  PT Short Term Goals - 09/16/16 1150      PT SHORT TERM GOAL #1   Title be independent in initial HEP   Status Achieved     PT SHORT TERM GOAL #2   Title verbalize and demonstrate correct posture and report corrections at home    Status Achieved     PT SHORT TERM GOAL #3   Title report a 30% reduction in Rt shoulder pain with reaching overhead   Status Achieved           PT Long Term Goals - 09/16/16 1150      PT LONG TERM GOAL #1   Title be independent in advanced HEP   Time 8   Period Weeks   Status  On-going     PT LONG TERM GOAL #2   Title reduce FOTO to < or = to 36% limitation   Time 8   Period Weeks   Status On-going     PT LONG TERM GOAL #3   Title report < or = to 3/10 Rt shoulder pain with use for self-care and ADLs   Time 8   Period Weeks   Status On-going     PT LONG TERM GOAL #4   Title demonstrate Rt shoulder IR AROM to lacking < or = to 2 inches vs the Lt to improve self-care   Time 8   Period Weeks   Status On-going               Plan - 09/18/16 1306    Clinical Impression Statement Pt continues to have some decrease shoulder stability. Presetns with rounded shoulders. Increase pain with shouler abduction and scaption but able to tolerate all other movements.  Pt will continue to benefit from skilled therapy to increase strength and stabliity in shoulder and scapula to increase shoulder biomechanics  for less pain with movement.  Therapist monitoring for pain and giving verbal cues for form during exercises.    Rehab Potential Good   Clinical Impairments Affecting Rehab Potential None   PT Frequency 2x / week   PT Duration 8 weeks   PT Treatment/Interventions ADLs/Self Care Home Management;Cryotherapy;Electrical Stimulation;Ultrasound;Moist Heat;Therapeutic activities;Therapeutic exercise;Neuromuscular re-education;Patient/family education;Passive range of motion;Manual techniques;Dry needling;Taping   PT Next Visit Plan Posture strength/ scapluar strength.     PT Home Exercise Plan progress as needed   Consulted and Agree with Plan of Care Patient      Patient will benefit from skilled therapeutic intervention in order to improve the following deficits and impairments:  Decreased range of motion, Pain, Postural dysfunction, Decreased strength, Impaired flexibility, Decreased activity tolerance  Visit Diagnosis: Pain in right shoulder  Muscle weakness (generalized)  Abnormal posture     Problem List Patient Active Problem List   Diagnosis Date Noted  . DVT (deep venous thrombosis) ([place]) 01/07/2013  . Pulmonary embolism ([place]) 01/05/2013  . GERD (gastroesophageal reflux disease) 01/05/2013    [name] 09/18/2016, 1:18 PM  [facility] [address], [place], [place], [address] Phone: [phone]   Fax:  [phone]  Name: Stacy Valencia MRN: [id number] Date of Birth: 11-21-1933

## 2016-09-22 DIAGNOSIS — M19011 Primary osteoarthritis, right shoulder: Secondary | ICD-10-CM | POA: Diagnosis not present

## 2016-09-22 DIAGNOSIS — M25511 Pain in right shoulder: Secondary | ICD-10-CM | POA: Diagnosis not present

## 2016-09-22 DIAGNOSIS — M7581 Other shoulder lesions, right shoulder: Secondary | ICD-10-CM | POA: Diagnosis not present

## 2016-09-23 ENCOUNTER — Ambulatory Visit: Payer: Medicare Other | Attending: Specialist

## 2016-09-23 DIAGNOSIS — M25511 Pain in right shoulder: Secondary | ICD-10-CM | POA: Diagnosis not present

## 2016-09-23 DIAGNOSIS — R293 Abnormal posture: Secondary | ICD-10-CM | POA: Diagnosis not present

## 2016-09-23 DIAGNOSIS — M6281 Muscle weakness (generalized): Secondary | ICD-10-CM | POA: Diagnosis not present

## 2016-09-23 DIAGNOSIS — G8929 Other chronic pain: Secondary | ICD-10-CM

## 2016-09-23 NOTE — Therapy (Addendum)
[facility] [address] [place], [place], [address] Phone: [phone]   Fax:  [phone]  Physical Therapy Treatment  Patient Details  Name: Stacy Valencia MRN: [id number] Date of Birth: 07-21-1933 Referring Provider: Cynda Familia, MD  Encounter Date: 09/23/2016      PT End of Session - 09/23/16 1246    Visit Number 8   Number of Visits 10   Date for PT Re-Evaluation 10/23/16   Authorization Type medicare g-code 10th visit   PT Start Time 1225   PT Stop Time 1250  limited exercise due to pain   PT Time Calculation (min) 25 min   Activity Tolerance Patient limited by pain   Behavior During Therapy [facility] for tasks assessed/performed      Past Medical History:  Diagnosis Date  . GERD (gastroesophageal reflux disease)   . PE (pulmonary embolism) 2005    Past Surgical History:  Procedure Laterality Date  . ABDOMINAL HYSTERECTOMY    . APPENDECTOMY      There were no vitals filed for this visit.      Subjective Assessment - 09/23/16 1223    Subjective Going to have MRI soon to determine if there is rotator cuff tear.  Would like to be placed on hold until after MRI.   Diagnostic tests x-ray: OA of Rt shoulder   Patient Stated Goals reduce shoulder pain   Currently in Pain? Yes   Pain Score 1    Pain Location Shoulder   Pain Orientation Right   Pain Descriptors / Indicators Sore   Pain Type Acute pain   Pain Onset More than a month ago   Pain Frequency Intermittent   Aggravating Factors  using Rt UE, eccentric motion   Pain Relieving Factors rest, not using it            [facility] PT Assessment - 09/23/16 0001      Assessment   Medical Diagnosis Rotator cuff tendinitis, localized arthritis of Rt shoulder     Prior Function   Level of Independence Independent     Cognition   Overall Cognitive Status Within Functional Limits for tasks assessed     Observation/Other Assessments    Focus on Therapeutic Outcomes (FOTO)  46% limitation     AROM   Overall AROM  Deficits   Overall AROM Comments IR limited by 5" vs the Lt                     [facility] Adult PT Treatment/Exercise - 09/23/16 0001      Shoulder Exercises: Seated   Extension Strengthening;Right;20 reps;Theraband   Theraband Level (Shoulder Extension) Level 2 (Red)   Row Strengthening;Both;20 reps;Theraband   Theraband Level (Shoulder Row) Level 2 (Red)   External Rotation Strengthening;Both;20 reps   Theraband Level (Shoulder External Rotation) Level 2 (Red)   Internal Rotation Strengthening;Right;20 reps;Theraband   Theraband Level (Shoulder Internal Rotation) Level 2 (Red)     Shoulder Exercises: Pulleys   Flexion 3 minutes     Shoulder Exercises: ROM/Strengthening   UBE (Upper Arm Bike) 6 min level 0 forward only  backward hurt                  PT Short Term Goals - 09/16/16 1150      PT SHORT TERM GOAL #1   Title be independent in initial HEP   Status Achieved     PT SHORT TERM GOAL #2   Title  verbalize and demonstrate correct posture and report corrections at home    Status Achieved     PT SHORT TERM GOAL #3   Title report a 30% reduction in Rt shoulder pain with reaching overhead   Status Achieved           PT Long Term Goals - October 17, 2016 1227      PT LONG TERM GOAL #1   Title be independent in advanced HEP   Time 8   Period Weeks   Status On-going     PT LONG TERM GOAL #3   Title report < or = to 3/10 Rt shoulder pain with use for self-care and ADLs   Time 8   Period Weeks   Status On-going     PT LONG TERM GOAL #4   Title demonstrate Rt shoulder IR AROM to lacking < or = to 2 inches vs the Lt to improve self-care   Time 8   Period Weeks   Status On-going               Plan - 2016-10-17 1242    Clinical Impression Statement Pt without any change in symptoms since the start of care.  She saw orthopedist and he would like to do MRI to  determine if there is a rotator cuff tear.  Pt with pain with Rt shoulder motion overhead. Limited exercise in the clinic today due to pain with most activity.  Pt will be placed on hold until MRI to determine if surgery is needed.     Rehab Potential Good   PT Frequency 2x / week   PT Duration 8 weeks   PT Treatment/Interventions ADLs/Self Care Home Management;Cryotherapy;Electrical Stimulation;Ultrasound;Moist Heat;Therapeutic activities;Therapeutic exercise;Neuromuscular re-education;Patient/family education;Passive range of motion;Manual techniques;Dry needling;Taping   PT Next Visit Plan Posture strength/ scapluar strength.     Consulted and Agree with Plan of Care Patient      Patient will benefit from skilled therapeutic intervention in order to improve the following deficits and impairments:  Decreased range of motion, Pain, Postural dysfunction, Decreased strength, Impaired flexibility, Decreased activity tolerance  Visit Diagnosis: Chronic right shoulder pain  Muscle weakness (generalized)  Abnormal posture     Problem List Patient Active Problem List   Diagnosis Date Noted  . DVT (deep venous thrombosis) ([place]) 01/07/2013  . Pulmonary embolism ([place]) 01/05/2013  . GERD (gastroesophageal reflux disease) 01/05/2013     Sigurd Sos, PT 2016/10/17 12:51 PM G-codes: Other PT primary CJ goal CK D/C status  PHYSICAL THERAPY DISCHARGE SUMMARY  Visits from Start of Care: 8  Current functional level related to goals / functional outcomes: See above for current status.  Pt was going to get an MRI.     Remaining deficits: See above for current status.     Education / Equipment:HEP Plan: Patient agrees to discharge.  Patient goals were partially met. Patient is being discharged due to not returning since the last visit.  ?????        Sigurd Sos, PT 10/22/16 9:12 AM  [facility] [address] [place], [place], [address] Phone: [phone]   Fax:  [phone]  Name: Stacy Valencia MRN: [id number] Date of Birth: March 16, 1933

## 2016-09-25 ENCOUNTER — Encounter: Payer: Medicare Other | Admitting: Physical Therapy

## 2016-09-30 ENCOUNTER — Ambulatory Visit: Payer: Self-pay

## 2016-10-02 DIAGNOSIS — M7581 Other shoulder lesions, right shoulder: Secondary | ICD-10-CM | POA: Diagnosis not present

## 2016-10-06 DIAGNOSIS — M8588 Other specified disorders of bone density and structure, other site: Secondary | ICD-10-CM | POA: Diagnosis not present

## 2016-10-15 DIAGNOSIS — S43431A Superior glenoid labrum lesion of right shoulder, initial encounter: Secondary | ICD-10-CM | POA: Diagnosis not present

## 2016-10-15 DIAGNOSIS — M19011 Primary osteoarthritis, right shoulder: Secondary | ICD-10-CM | POA: Diagnosis not present

## 2016-10-15 DIAGNOSIS — M75111 Incomplete rotator cuff tear or rupture of right shoulder, not specified as traumatic: Secondary | ICD-10-CM | POA: Diagnosis not present

## 2016-10-15 DIAGNOSIS — M7581 Other shoulder lesions, right shoulder: Secondary | ICD-10-CM | POA: Diagnosis not present

## 2016-10-16 ENCOUNTER — Encounter: Payer: Self-pay | Admitting: *Deleted

## 2016-10-16 NOTE — Progress Notes (Signed)
Faxed paperwork to [facility] for pre-operative clearance for right shoulder surgery. Fax # is [phone] attention Santiago Bur.

## 2016-10-27 DIAGNOSIS — M8580 Other specified disorders of bone density and structure, unspecified site: Secondary | ICD-10-CM | POA: Diagnosis not present

## 2016-10-28 DIAGNOSIS — S46011A Strain of muscle(s) and tendon(s) of the rotator cuff of right shoulder, initial encounter: Secondary | ICD-10-CM | POA: Diagnosis not present

## 2016-10-28 DIAGNOSIS — G8918 Other acute postprocedural pain: Secondary | ICD-10-CM | POA: Diagnosis not present

## 2016-10-28 DIAGNOSIS — M75111 Incomplete rotator cuff tear or rupture of right shoulder, not specified as traumatic: Secondary | ICD-10-CM | POA: Diagnosis not present

## 2016-10-28 DIAGNOSIS — M19011 Primary osteoarthritis, right shoulder: Secondary | ICD-10-CM | POA: Diagnosis not present

## 2016-10-28 HISTORY — PX: OTHER SURGICAL HISTORY: SHX169

## 2016-11-11 DIAGNOSIS — Z4789 Encounter for other orthopedic aftercare: Secondary | ICD-10-CM | POA: Diagnosis not present

## 2016-11-11 DIAGNOSIS — S43431A Superior glenoid labrum lesion of right shoulder, initial encounter: Secondary | ICD-10-CM | POA: Diagnosis not present

## 2016-11-17 ENCOUNTER — Encounter: Payer: Self-pay | Admitting: Physical Therapy

## 2016-11-17 ENCOUNTER — Ambulatory Visit: Payer: Medicare Other | Attending: Physician Assistant | Admitting: Physical Therapy

## 2016-11-17 DIAGNOSIS — M25511 Pain in right shoulder: Secondary | ICD-10-CM | POA: Insufficient documentation

## 2016-11-17 DIAGNOSIS — R293 Abnormal posture: Secondary | ICD-10-CM | POA: Diagnosis not present

## 2016-11-17 DIAGNOSIS — M6281 Muscle weakness (generalized): Secondary | ICD-10-CM | POA: Diagnosis not present

## 2016-11-17 DIAGNOSIS — G8929 Other chronic pain: Secondary | ICD-10-CM | POA: Diagnosis not present

## 2016-11-17 NOTE — Patient Instructions (Addendum)
Flexion (Passive)    Sitting upright, slide forearm forward along table, bending from the waist until a stretch is felt. Hold __3__ seconds. Repeat _10___ times. Do _3___ sessions per day.  Copyright  VHI. All rights reserved.  Abduction (Passive)    With arm out to side, resting on table, lower head toward arm, keeping trunk away from table. Hold __3__ seconds. Repeat __10__ times. Do __3__ sessions per day.  Copyright  VHI. All rights reserved.  [place] [address] [place], [place] [address] Phone # [phone] Fax [phone]

## 2016-11-17 NOTE — Therapy (Signed)
[facility] [address], [place], [place], [address] Phone: [phone]   Fax:  [phone]  Physical Therapy Evaluation  Patient Details  Name: Stacy Valencia MRN: [id number] Date of Birth: 15-Jun-1933 Referring Provider: Dr. Gerrit Halls  Encounter Date: 11/17/2016      PT End of Session - 11/17/16 1224    Visit Number 1   Number of Visits 10   Date for PT Re-Evaluation 01/12/17   Authorization Type medicare g-code 10th visit   PT Start Time [id number]   PT Stop Time 1225   PT Time Calculation (min) 40 min   Activity Tolerance Patient limited by pain   Behavior During Therapy [facility] for tasks assessed/performed      Past Medical History:  Diagnosis Date  . GERD (gastroesophageal reflux disease)   . PE (pulmonary embolism) 2005    Past Surgical History:  Procedure Laterality Date  . ABDOMINAL HYSTERECTOMY    . APPENDECTOMY    . arthroscopic shoulder surgery Right 10/28/2016   decompression of SAD/DCR    There were no vitals filed for this visit.       Subjective Assessment - 11/17/16 1155    Subjective Patient had surgery right shoulder on 10/28/2016 to perform SAD.DCR.  Patient wore a sling for 2 weeks.  MD told her not to lift anything heavy.    Pertinent History s/P arthroscopic SAD/DCR on 10/28/2016   Patient Stated Goals reduce shoulder pain and be able to use right arm   Currently in Pain? Yes   Pain Score 6    Pain Location Shoulder   Pain Orientation Right   Pain Descriptors / Indicators Aching;Sharp   Pain Type Acute pain   Pain Onset 1 to 4 weeks ago   Pain Frequency Intermittent   Aggravating Factors  moving right arm   Pain Relieving Factors arm at side            [facility] PT Assessment - 11/17/16 0001      Assessment   Medical Diagnosis 247.89 encounter for care following arthroscpic SAD.DCR   Referring Provider Dr. Gerrit Halls   Onset Date/Surgical Date 10/28/16   Hand Dominance  Right   Prior Therapy yes, prior to surgery     Precautions   Precautions Shoulder   Type of Shoulder Precautions no heavy lifting     Restrictions   Weight Bearing Restrictions No     Balance Screen   Has the patient fallen in the past 6 months No   Has the patient had a decrease in activity level because of a fear of falling?  No   Is the patient reluctant to leave their home because of a fear of falling?  No     Home Ecologist residence     Prior Function   Level of Independence Independent with basic ADLs   Vocation Retired     Associate Professor   Overall Cognitive Status Within Functional Limits for tasks assessed     Observation/Other Assessments   Focus on Therapeutic Outcomes (FOTO)  51% limitation  goal is 36% limitation     Posture/Postural Control   Posture/Postural Control Postural limitations   Postural Limitations Rounded Shoulders;Forward head;Increased thoracic kyphosis     ROM / Strength   AROM / PROM / Strength AROM;PROM;Strength     AROM   Right Shoulder Flexion 50 Degrees   Right Shoulder ABduction 55 Degrees   Right Shoulder Internal Rotation 50 Degrees  Right Shoulder External Rotation 30 Degrees     PROM   Right Shoulder Flexion 96 Degrees   Right Shoulder ABduction 85 Degrees   Right Shoulder Internal Rotation 50 Degrees   Right Shoulder External Rotation 55 Degrees     Strength   Right Shoulder Flexion 2-/5   Right Shoulder Extension 2-/5   Right Shoulder ABduction 2-/5   Right Shoulder Internal Rotation 2-/5   Right Shoulder External Rotation 2-/5     Palpation   Palpation comment tenderness located in right shoulder; surgical site are healing                           PT Education - 11/17/16 1224    Education provided Yes   Education Details right shoulder PROM in sitting for flexion and abduction   Person(s) Educated Patient   Methods Explanation;Demonstration;Verbal cues;Handout    Comprehension Returned demonstration;Verbalized understanding          PT Short Term Goals - 11/17/16 1207      PT SHORT TERM GOAL #1   Title be independent in initial HEP   Time 4   Period Weeks   Status New     PT SHORT TERM GOAL #2   Title be able to wear bra due to pain in right shoulder decreased >/= 25%   Time 4   Period Weeks   Status New     PT SHORT TERM GOAL #3   Title be able to lift right arm to place deodarant under the left arm with minimal pain   Time 4   Period Weeks   Status New     PT SHORT TERM GOAL #4   Title right shoulder strength </= 3-/5 to perfrom overhead activities inclucing reaching in cabinet and wash hair   Time 4   Period Weeks   Status New     PT SHORT TERM GOAL #5   Title sleep in her bed due to right shoulder pain decreased >/= 25%   Time 4   Period Weeks   Status New           PT Long Term Goals - 11/17/16 1206      PT LONG TERM GOAL #1   Title be independent in advanced HEP   Time 8   Period Weeks   Status New     PT LONG TERM GOAL #2   Title reduce FOTO to < or = to 36% limitation   Time 8   Period Weeks   Status New     PT LONG TERM GOAL #3   Title report < or = to 3/10 Rt shoulder pain with use for self-care and ADLs   Time 8   Period Weeks   Status New     PT LONG TERM GOAL #4   Title demonstrate Rt shoulder IR AROM to lacking < or = to 2 inches vs the Lt to improve self-care   Time 8   Period Weeks   Status New     PT LONG TERM GOAL #5   Title return to driving herself to therapy due to right shoulder AROM >/= 120 for flexion and abduction   Time 8   Period Weeks   Status New               Plan - 11/17/16 1225    Clinical Impression Statement Patient is a 82 year old female s/p arthroscopic right  shoulder surgery with SAD/DCR on 10/28/2016.  Patient was placed in a sling for 2 weeks.  Patient reports her pain with movement is 6/10.  Patient AROM of right shoulder is limited by 75% and PROM is  limited by 50%.  Patient right shoulder strength is 2-/5.  Patient is unable to use right arm for daily tasks, reaching overhead, put her bra on, place deodarant on the left arm, and sleep in her bed. Surgical site is healing with a scab.  Palpable tenderness located in right shoulder. Patient is of low complexity evaluation due to and evolving conition and one comorbidities that will impact care provided.  Patient will benefit from skilled therapy to reduce pain and improve right shoulder function and strength.    Rehab Potential Good   Clinical Impairments Affecting Rehab Potential None   PT Frequency 3x / week   PT Duration 8 weeks   PT Treatment/Interventions ADLs/Self Care Home Management;Cryotherapy;Electrical Stimulation;Ultrasound;Moist Heat;Therapeutic activities;Therapeutic exercise;Neuromuscular re-education;Patient/family education;Passive range of motion;Manual techniques;Dry needling;Taping   PT Next Visit Plan PROM to right shoulder, modalities for pain, right shoulder isometrics; overhead pulleys   PT Home Exercise Plan progress as needed   Recommended Other Services None   Consulted and Agree with Plan of Care Patient      Patient will benefit from skilled therapeutic intervention in order to improve the following deficits and impairments:  Decreased range of motion, Pain, Postural dysfunction, Decreased strength, Impaired flexibility, Decreased activity tolerance  Visit Diagnosis: Chronic right shoulder pain - Plan: PT plan of care cert/re-cert  Muscle weakness (generalized) - Plan: PT plan of care cert/re-cert  Abnormal posture - Plan: PT plan of care cert/re-cert  Acute pain of right shoulder - Plan: PT plan of care cert/re-cert      G-Codes - [id number]    Functional Assessment Tool Used FOTO score is 51% limitation   Functional Limitation Other PT primary   Other PT Primary Current Status [id number]) At least 40 percent but less than 60 percent impaired, limited or  restricted   Other PT Primary Goal Status [id number]) At least 20 percent but less than 40 percent impaired, limited or restricted       Problem List Patient Active Problem List   Diagnosis Date Noted  . DVT (deep venous thrombosis) ([place]) 01/07/2013  . Pulmonary embolism ([place]) 01/05/2013  . GERD (gastroesophageal reflux disease) 01/05/2013    Earlie Counts, PT 11/17/16 12:34 PM   [facility] [address], [place], [place], [address] Phone: [phone]   Fax:  [phone]  Name: Stacy Valencia MRN: [id number] Date of Birth: August 10, 1933

## 2016-11-18 DIAGNOSIS — L821 Other seborrheic keratosis: Secondary | ICD-10-CM | POA: Diagnosis not present

## 2016-11-18 DIAGNOSIS — L814 Other melanin hyperpigmentation: Secondary | ICD-10-CM | POA: Diagnosis not present

## 2016-11-18 DIAGNOSIS — F424 Excoriation (skin-picking) disorder: Secondary | ICD-10-CM | POA: Diagnosis not present

## 2016-11-18 DIAGNOSIS — D485 Neoplasm of uncertain behavior of skin: Secondary | ICD-10-CM | POA: Diagnosis not present

## 2016-11-18 DIAGNOSIS — L98499 Non-pressure chronic ulcer of skin of other sites with unspecified severity: Secondary | ICD-10-CM | POA: Diagnosis not present

## 2016-11-19 ENCOUNTER — Encounter: Payer: Self-pay | Admitting: Physical Therapy

## 2016-11-19 ENCOUNTER — Ambulatory Visit: Payer: Medicare Other | Admitting: Physical Therapy

## 2016-11-19 DIAGNOSIS — R293 Abnormal posture: Secondary | ICD-10-CM | POA: Diagnosis not present

## 2016-11-19 DIAGNOSIS — G8929 Other chronic pain: Secondary | ICD-10-CM | POA: Diagnosis not present

## 2016-11-19 DIAGNOSIS — M25511 Pain in right shoulder: Secondary | ICD-10-CM | POA: Diagnosis not present

## 2016-11-19 DIAGNOSIS — M6281 Muscle weakness (generalized): Secondary | ICD-10-CM

## 2016-11-19 NOTE — Therapy (Signed)
[facility] [address], [place], [place], [address] Phone: [phone]   Fax:  [phone]  Physical Therapy Treatment  Patient Details  Name: Stacy [place] MRN: [id number] Date of Birth: 01-31-1933 Referring Provider: Dr. Gerrit Halls  Encounter Date: 11/19/2016      PT End of Session - 11/19/16 1144    Visit Number 2   Number of Visits 10   Date for PT Re-Evaluation 01/12/17   Authorization Type medicare g-code 10th visit   PT Start Time [id number]   PT Stop Time 1227   PT Time Calculation (min) 43 min   Activity Tolerance Patient limited by pain   Behavior During Therapy [facility] for tasks assessed/performed      Past Medical History:  Diagnosis Date  . GERD (gastroesophageal reflux disease)   . PE (pulmonary embolism) 2005    Past Surgical History:  Procedure Laterality Date  . ABDOMINAL HYSTERECTOMY    . APPENDECTOMY    . arthroscopic shoulder surgery Right 10/28/2016   decompression of SAD/DCR    There were no vitals filed for this visit.      Subjective Assessment - 11/19/16 1149    Subjective Pt reports shoulder is sore today.  States anything overhead is very painful.   Patient Stated Goals reduce shoulder pain and be able to use right arm   Currently in Pain? Yes   Pain Score 4    Pain Location Shoulder   Pain Orientation Right   Pain Descriptors / Indicators Sore   Pain Type Acute pain;Surgical pain   Pain Onset 1 to 4 weeks ago   Pain Frequency Intermittent   Aggravating Factors  moving right arm   Pain Relieving Factors not moving   Effect of Pain on Daily Activities limited overhead activities   Multiple Pain Sites No                         [facility] Adult PT Treatment/Exercise - 11/19/16 0001      Shoulder Exercises: Seated   Other Seated Exercises scap squeezes 20x     Shoulder Exercises: Standing   Other Standing Exercises isometric internal and external rotation,  flexion, and extension - 5x5sec holds at 50%     Shoulder Exercises: Pulleys   Flexion 2 minutes     Shoulder Exercises: ROM/Strengthening   Other ROM/Strengthening Exercises UE ROM towel circles, fwd, side, 45 degrees - 10x each; wall slides 10x     Manual Therapy   Manual Therapy Passive ROM;Joint mobilization   Joint Mobilization glenohumeral distraction, A/P mob grade I-II   Passive ROM internal and external rotation, flexion, abductoin                  PT Short Term Goals - 11/19/16 1345      PT SHORT TERM GOAL #1   Title be independent in initial HEP   Time 4   Period Weeks   Status On-going     PT SHORT TERM GOAL #2   Title be able to wear bra due to pain in right shoulder decreased >/= 25%   Time 4   Period Weeks   Status On-going     PT SHORT TERM GOAL #3   Title be able to lift right arm to place deodarant under the left arm with minimal pain   Time 4   Period Weeks   Status On-going     PT SHORT TERM GOAL #  4   Title right shoulder strength </= 3-/5 to perfrom overhead activities inclucing reaching in cabinet and wash hair   Time 4   Period Weeks   Status On-going     PT SHORT TERM GOAL #5   Title sleep in her bed due to right shoulder pain decreased >/= 25%   Time 4   Period Weeks   Status On-going           PT Long Term Goals - 11/17/16 1206      PT LONG TERM GOAL #1   Title be independent in advanced HEP   Time 8   Period Weeks   Status New     PT LONG TERM GOAL #2   Title reduce FOTO to < or = to 36% limitation   Time 8   Period Weeks   Status New     PT LONG TERM GOAL #3   Title report < or = to 3/10 Rt shoulder pain with use for self-care and ADLs   Time 8   Period Weeks   Status New     PT LONG TERM GOAL #4   Title demonstrate Rt shoulder IR AROM to lacking < or = to 2 inches vs the Lt to improve self-care   Time 8   Period Weeks   Status New     PT LONG TERM GOAL #5   Title return to driving herself to therapy  due to right shoulder AROM >/= 120 for flexion and abduction   Time 8   Period Weeks   Status New               Plan - 11/19/16 1346    Clinical Impression Statement Pt continues to have pain with movements into overhead flexion.  Pt demonstrates a lot of guarding and needs verbal and tactile cues to lower shoulders down during exercises.  Pt educated to reinforce improved posture with all ROM and strengthening exercises.  Pt able to initiate gentle isometric exercises today.  skilled PT needed to continue to increase ROM and strength back to [facility] to return to functional activities.   Rehab Potential Good   Clinical Impairments Affecting Rehab Potential None   PT Frequency 3x / week   PT Duration 8 weeks   PT Treatment/Interventions ADLs/Self Care Home Management;Cryotherapy;Electrical Stimulation;Ultrasound;Moist Heat;Therapeutic activities;Therapeutic exercise;Neuromuscular re-education;Patient/family education;Passive range of motion;Manual techniques;Dry needling;Taping   PT Next Visit Plan PROM to right shoulder, modalities for pain, right shoulder isometrics; overhead pulleys   PT Home Exercise Plan progress as needed   Consulted and Agree with Plan of Care Patient      Patient will benefit from skilled therapeutic intervention in order to improve the following deficits and impairments:  Decreased range of motion, Pain, Postural dysfunction, Decreased strength, Impaired flexibility, Decreased activity tolerance  Visit Diagnosis: Chronic right shoulder pain  Muscle weakness (generalized)  Abnormal posture  Acute pain of right shoulder     Problem List Patient Active Problem List   Diagnosis Date Noted  . DVT (deep venous thrombosis) ([place]) 01/07/2013  . Pulmonary embolism ([place]) 01/05/2013  . GERD (gastroesophageal reflux disease) 01/05/2013    Zannie Cove, PT 11/19/2016, 2:07 PM  [facility] [address] [place], [place], [address] Phone: [phone]   Fax:  [phone]  Name: Stacy [place] MRN: [id number] Date of Birth: October 28, 1933

## 2016-11-20 DIAGNOSIS — L98499 Non-pressure chronic ulcer of skin of other sites with unspecified severity: Secondary | ICD-10-CM | POA: Diagnosis not present

## 2016-11-21 ENCOUNTER — Encounter: Payer: Medicare Other | Admitting: Physical Therapy

## 2016-11-21 ENCOUNTER — Encounter: Payer: Self-pay | Admitting: Physical Therapy

## 2016-11-21 ENCOUNTER — Ambulatory Visit: Payer: Medicare Other | Attending: Physician Assistant | Admitting: Physical Therapy

## 2016-11-21 DIAGNOSIS — M25511 Pain in right shoulder: Secondary | ICD-10-CM | POA: Insufficient documentation

## 2016-11-21 DIAGNOSIS — R293 Abnormal posture: Secondary | ICD-10-CM | POA: Insufficient documentation

## 2016-11-21 DIAGNOSIS — M6281 Muscle weakness (generalized): Secondary | ICD-10-CM | POA: Insufficient documentation

## 2016-11-21 DIAGNOSIS — G8929 Other chronic pain: Secondary | ICD-10-CM | POA: Insufficient documentation

## 2016-11-21 NOTE — Therapy (Signed)
[facility] [address], [place], [place], [address] Phone: [phone]   Fax:  [phone]  Physical Therapy Treatment  Patient Details  Name: Stacy [place] MRN: [id number] Date of Birth: 1933/05/07 Referring Provider: Dr. Gerrit Halls  Encounter Date: 11/21/2016      PT End of Session - 11/21/16 1019    Visit Number 3   Number of Visits 10   Date for PT Re-Evaluation 01/12/17   Authorization Type medicare g-code 10th visit   PT Start Time 1015   PT Stop Time 1056   PT Time Calculation (min) 41 min   Activity Tolerance Patient limited by pain   Behavior During Therapy [facility] for tasks assessed/performed      Past Medical History:  Diagnosis Date  . GERD (gastroesophageal reflux disease)   . PE (pulmonary embolism) 2005    Past Surgical History:  Procedure Laterality Date  . ABDOMINAL HYSTERECTOMY    . APPENDECTOMY    . arthroscopic shoulder surgery Right 10/28/2016   decompression of SAD/DCR    There were no vitals filed for this visit.      Subjective Assessment - 11/21/16 1018    Subjective Pt reports shoulder feeling sore today.    Pertinent History s/P arthroscopic SAD/DCR on 10/28/2016   Diagnostic tests x-ray: OA of Rt shoulder   Patient Stated Goals reduce shoulder pain and be able to use right arm   Currently in Pain? Yes   Pain Score 5    Pain Location Shoulder   Pain Orientation Right   Pain Descriptors / Indicators Sore   Pain Type Acute pain;Surgical pain   Pain Onset 1 to 4 weeks ago   Pain Frequency Intermittent                         [facility] Adult PT Treatment/Exercise - 11/21/16 0001      Shoulder Exercises: Seated   Other Seated Exercises scap squeezes 20x     Shoulder Exercises: Standing   Other Standing Exercises 6 way isometrics  at wall x20 each     Shoulder Exercises: Pulleys   Flexion 2 minutes     Manual Therapy   Manual Therapy Passive  ROM;Joint mobilization   Passive ROM internal and external rotation, flexion, abductoin                  PT Short Term Goals - 11/19/16 1345      PT SHORT TERM GOAL #1   Title be independent in initial HEP   Time 4   Period Weeks   Status On-going     PT SHORT TERM GOAL #2   Title be able to wear bra due to pain in right shoulder decreased >/= 25%   Time 4   Period Weeks   Status On-going     PT SHORT TERM GOAL #3   Title be able to lift right arm to place deodarant under the left arm with minimal pain   Time 4   Period Weeks   Status On-going     PT SHORT TERM GOAL #4   Title right shoulder strength </= 3-/5 to perfrom overhead activities inclucing reaching in cabinet and wash hair   Time 4   Period Weeks   Status On-going     PT SHORT TERM GOAL #5   Title sleep in her bed due to right shoulder pain decreased >/= 25%   Time 4  Period Weeks   Status On-going           PT Long Term Goals - 11/17/16 1206      PT LONG TERM GOAL #1   Title be independent in advanced HEP   Time 8   Period Weeks   Status New     PT LONG TERM GOAL #2   Title reduce FOTO to < or = to 36% limitation   Time 8   Period Weeks   Status New     PT LONG TERM GOAL #3   Title report < or = to 3/10 Rt shoulder pain with use for self-care and ADLs   Time 8   Period Weeks   Status New     PT LONG TERM GOAL #4   Title demonstrate Rt shoulder IR AROM to lacking < or = to 2 inches vs the Lt to improve self-care   Time 8   Period Weeks   Status New     PT LONG TERM GOAL #5   Title return to driving herself to therapy due to right shoulder AROM >/= 120 for flexion and abduction   Time 8   Period Weeks   Status New               Plan - 11/21/16 1029    Clinical Impression Statement Pt able to tolerate all strengthening and ROM well. ROM continues to be limited by pain. Pt will continue to benefit from skilled therapy for shoulder strength and ROM.   Rehab  Potential Good   Clinical Impairments Affecting Rehab Potential None   PT Frequency 3x / week   PT Duration 8 weeks   PT Treatment/Interventions ADLs/Self Care Home Management;Cryotherapy;Electrical Stimulation;Ultrasound;Moist Heat;Therapeutic activities;Therapeutic exercise;Neuromuscular re-education;Patient/family education;Passive range of motion;Manual techniques;Dry needling;Taping   PT Next Visit Plan PROM to right shoulder, modalities for pain, right shoulder isometrics; overhead pulleys   Consulted and Agree with Plan of Care Patient      Patient will benefit from skilled therapeutic intervention in order to improve the following deficits and impairments:  Decreased range of motion, Pain, Postural dysfunction, Decreased strength, Impaired flexibility, Decreased activity tolerance  Visit Diagnosis: Chronic right shoulder pain  Muscle weakness (generalized)  Abnormal posture  Acute pain of right shoulder     Problem List Patient Active Problem List   Diagnosis Date Noted  . DVT (deep venous thrombosis) ([place]) 01/07/2013  . Pulmonary embolism ([place]) 01/05/2013  . GERD (gastroesophageal reflux disease) 01/05/2013    [name] 11/21/2016, 11:05 AM  [facility] [address], [place], [place], [address] Phone: [phone]   Fax:  [phone]  Name: [name] MRN: [id number] Date of Birth: 17-Nov-1933

## 2016-11-24 ENCOUNTER — Encounter: Payer: Self-pay | Admitting: Physical Therapy

## 2016-11-24 ENCOUNTER — Ambulatory Visit: Payer: Medicare Other | Admitting: Physical Therapy

## 2016-11-24 DIAGNOSIS — M25511 Pain in right shoulder: Secondary | ICD-10-CM | POA: Diagnosis not present

## 2016-11-24 DIAGNOSIS — M6281 Muscle weakness (generalized): Secondary | ICD-10-CM | POA: Diagnosis not present

## 2016-11-24 DIAGNOSIS — G8929 Other chronic pain: Secondary | ICD-10-CM

## 2016-11-24 DIAGNOSIS — R293 Abnormal posture: Secondary | ICD-10-CM | POA: Diagnosis not present

## 2016-11-24 NOTE — Therapy (Signed)
[facility] [address] [place], [place], [address] Phone: [phone]   Fax:  [phone]  Physical Therapy Treatment  Patient Details  Name: Stacy [place] MRN: [id number] Date of Birth: Oct 23, 1933 Referring Provider: Dr. Gerrit Halls  Encounter Date: 11/24/2016      PT End of Session - 11/24/16 1012    Visit Number 4   Number of Visits 10   Date for PT Re-Evaluation 01/12/17   Authorization Type medicare g-code 10th visit   PT Start Time 0930   PT Stop Time 1010   PT Time Calculation (min) 40 min   Activity Tolerance Patient tolerated treatment well   Behavior During Therapy [facility] for tasks assessed/performed      Past Medical History:  Diagnosis Date  . GERD (gastroesophageal reflux disease)   . PE (pulmonary embolism) 2005    Past Surgical History:  Procedure Laterality Date  . ABDOMINAL HYSTERECTOMY    . APPENDECTOMY    . arthroscopic shoulder surgery Right 10/28/2016   decompression of SAD/DCR    There were no vitals filed for this visit.      Subjective Assessment - 11/24/16 0938    Subjective My shoulder has been sore with therapy.  Patient is able to sleep in her bed the past couple of nights but only for 75% of the night.    Pertinent History s/P arthroscopic SAD/DCR on 10/28/2016   Diagnostic tests x-ray: OA of Rt shoulder   Patient Stated Goals reduce shoulder pain and be able to use right arm   Currently in Pain? Yes   Pain Score 5    Pain Location Shoulder   Pain Orientation Right   Pain Descriptors / Indicators Sore   Pain Type Acute pain   Pain Onset 1 to 4 weeks ago   Pain Frequency Intermittent   Aggravating Factors  moving right arm   Pain Relieving Factors arm at side   Effect of Pain on Daily Activities limitated overhead activities   Multiple Pain Sites No            [facility] PT Assessment - 11/24/16 0001      AROM   Right Shoulder Flexion 90 Degrees   Right  Shoulder ABduction 55 Degrees                     [facility] Adult PT Treatment/Exercise - 11/24/16 0001      Shoulder Exercises: Seated   Extension Strengthening;Both;10 reps;Theraband   Theraband Level (Shoulder Extension) Level 1 (Yellow)   Row Strengthening;Both;10 reps;Theraband  2 sets   Theraband Level (Shoulder Row) Level 1 (Yellow)   Other Seated Exercises stand with pushing into ball into mat hold 5 sec 10x, move ball back and forth 30x, side to side 30x, circles 15x each way     Shoulder Exercises: Pulleys   Flexion 2 minutes;Other (comment)  therapist guides left scapula     Manual Therapy   Manual Therapy Passive ROM;Joint mobilization;Soft tissue mobilization   Joint Mobilization distraction to right shoulder, inferior glide grade 3   Soft tissue mobilization anterior right shoulder and posterior deltoid   Passive ROM flexion, abduction, ER, IR in reclined position                PT Education - 11/24/16 1012    Education provided No          PT Short Term Goals - 11/24/16 1015      PT SHORT  TERM GOAL #1   Title be independent in initial HEP   Time 4   Period Weeks   Status Achieved     PT SHORT TERM GOAL #2   Title be able to wear bra due to pain in right shoulder decreased >/= 25%   Time 4   Period Weeks   Status On-going     PT SHORT TERM GOAL #3   Title be able to lift right arm to place deodarant under the left arm with minimal pain   Time 4   Period Weeks   Status On-going     PT SHORT TERM GOAL #4   Title right shoulder strength </= 3-/5 to perfrom overhead activities inclucing reaching in cabinet and wash hair   Time 4   Period Weeks   Status On-going     PT SHORT TERM GOAL #5   Title sleep in her bed due to right shoulder pain decreased >/= 25%   Time 4   Period Weeks   Status On-going  75% of the night           PT Long Term Goals - 11/17/16 1206      PT LONG TERM GOAL #1   Title be independent in advanced  HEP   Time 8   Period Weeks   Status New     PT LONG TERM GOAL #2   Title reduce FOTO to < or = to 36% limitation   Time 8   Period Weeks   Status New     PT LONG TERM GOAL #3   Title report < or = to 3/10 Rt shoulder pain with use for self-care and ADLs   Time 8   Period Weeks   Status New     PT LONG TERM GOAL #4   Title demonstrate Rt shoulder IR AROM to lacking < or = to 2 inches vs the Lt to improve self-care   Time 8   Period Weeks   Status New     PT LONG TERM GOAL #5   Title return to driving herself to therapy due to right shoulder AROM >/= 120 for flexion and abduction   Time 8   Period Weeks   Status New               Plan - 11/24/16 1013    Clinical Impression Statement Patient able to sleep in her bed for 75% of the night now.  Patient right shoulder aches with more use.  Patient is indepedent with her current HEP.  She has increased right shoulder flexion AROM.  Patient has weak scapular retraction muscles.  Patient will benefit from skilled therapy to improve right shoulder ROM and decrease pain.    Rehab Potential Good   Clinical Impairments Affecting Rehab Potential None   PT Frequency 3x / week   PT Duration 8 weeks   PT Treatment/Interventions ADLs/Self Care Home Management;Cryotherapy;Electrical Stimulation;Ultrasound;Moist Heat;Therapeutic activities;Therapeutic exercise;Neuromuscular re-education;Patient/family education;Passive range of motion;Manual techniques;Dry needling;Taping   PT Next Visit Plan PROM to right shoulder, modalities for pain, right shoulder isometrics; overhead pulleys; scapula stabilizers; ultrasound to right shoulder for soreness   PT Home Exercise Plan progress as needed   Consulted and Agree with Plan of Care Patient      Patient will benefit from skilled therapeutic intervention in order to improve the following deficits and impairments:  Decreased range of motion, Pain, Postural dysfunction, Decreased strength,  Impaired flexibility, Decreased activity tolerance  Visit Diagnosis:  Chronic right shoulder pain  Muscle weakness (generalized)     Problem List Patient Active Problem List   Diagnosis Date Noted  . DVT (deep venous thrombosis) ([place]) 01/07/2013  . Pulmonary embolism ([place]) 01/05/2013  . GERD (gastroesophageal reflux disease) 01/05/2013    Earlie Counts, PT 11/24/16 10:17 AM   [facility] [address], [place], [place], [address] Phone: [phone]   Fax:  [phone]  Name: [place] MRN: [id number] Date of Birth: May 05, 1933

## 2016-11-26 ENCOUNTER — Encounter: Payer: Self-pay | Admitting: Physical Therapy

## 2016-11-26 ENCOUNTER — Ambulatory Visit: Payer: Medicare Other | Admitting: Physical Therapy

## 2016-11-26 DIAGNOSIS — M25511 Pain in right shoulder: Secondary | ICD-10-CM | POA: Diagnosis not present

## 2016-11-26 DIAGNOSIS — G8929 Other chronic pain: Secondary | ICD-10-CM

## 2016-11-26 DIAGNOSIS — R293 Abnormal posture: Secondary | ICD-10-CM

## 2016-11-26 DIAGNOSIS — M6281 Muscle weakness (generalized): Secondary | ICD-10-CM

## 2016-11-26 NOTE — Patient Instructions (Signed)
Cane Overhead - Supine  Hold cane at thighs with both hands, extend arms straight over head. Hold _1__ seconds just move slow and smooth.  Repeat _2x 10 __ times. Do _2__ times per day.  Marland Kitchen

## 2016-11-26 NOTE — Therapy (Signed)
[facility] [address] [place], [place], [address] Phone: [phone]   Fax:  [phone]  Physical Therapy Treatment  Patient Details  Name: Stacy [place] MRN: [id number] Date of Birth: 1933/01/18 Referring Provider: Dr. Gerrit Halls  Encounter Date: 11/26/2016      PT End of Session - 11/26/16 1101    Visit Number 5   Number of Visits 10   Date for PT Re-Evaluation 01/12/17   Authorization Type medicare g-code 10th visit   PT Start Time 1101   PT Stop Time 1153   PT Time Calculation (min) 52 min   Activity Tolerance Patient tolerated treatment well   Behavior During Therapy [facility] for tasks assessed/performed      Past Medical History:  Diagnosis Date  . GERD (gastroesophageal reflux disease)   . PE (pulmonary embolism) 2005    Past Surgical History:  Procedure Laterality Date  . ABDOMINAL HYSTERECTOMY    . APPENDECTOMY    . arthroscopic shoulder surgery Right 10/28/2016   decompression of SAD/DCR    There were no vitals filed for this visit.      Subjective Assessment - 11/26/16 1103    Subjective Constant soreness, but not bad.   Currently in Pain? Yes   Pain Score 5    Pain Location Shoulder   Pain Orientation Right   Pain Descriptors / Indicators Sore;Constant   Multiple Pain Sites No                         [facility] Adult PT Treatment/Exercise - 11/26/16 0001      Shoulder Exercises: Supine   Horizontal ABduction AAROM;Both;15 reps   Theraband Level (Shoulder Horizontal ABduction) --  Used cane   External Rotation AAROM;Both;20 reps   Theraband Level (Shoulder External Rotation) --  with cane   Flexion AAROM;Both;20 reps  Elbows flexed then straight/cane      Shoulder Exercises: Seated   Other Seated Exercises UE ranger; circumduction 20x each direction     Shoulder Exercises: Standing   Flexion AAROM;Right;10 reps   Theraband Level (Shoulder Flexion) --  At  finger ladder   Extension Strengthening;Both;20 reps;Theraband   Theraband Level (Shoulder Extension) Level 1 (Yellow)     Shoulder Exercises: Pulleys   Flexion 3 minutes  Scaption 3 min, concurrent review of status     Shoulder Exercises: Isometric Strengthening   Flexion 3X5"   Extension 3X5"   External Rotation 3X5"   Internal Rotation 3X5"   ADduction 3X5"     Cryotherapy   Number Minutes Cryotherapy 10 Minutes   Cryotherapy Location Shoulder   Type of Cryotherapy Ice pack                PT Education - 11/26/16 1115    Education provided Yes   Education Details Supine cane exs for HEP   Person(s) Educated Patient   Methods Explanation;Demonstration;Tactile cues;Verbal cues;Handout   Comprehension Verbalized understanding;Returned demonstration          PT Short Term Goals - 11/24/16 1015      PT SHORT TERM GOAL #1   Title be independent in initial HEP   Time 4   Period Weeks   Status Achieved     PT SHORT TERM GOAL #2   Title be able to wear bra due to pain in right shoulder decreased >/= 25%   Time 4   Period Weeks   Status On-going  PT SHORT TERM GOAL #3   Title be able to lift right arm to place deodarant under the left arm with minimal pain   Time 4   Period Weeks   Status On-going     PT SHORT TERM GOAL #4   Title right shoulder strength </= 3-/5 to perfrom overhead activities inclucing reaching in cabinet and wash hair   Time 4   Period Weeks   Status On-going     PT SHORT TERM GOAL #5   Title sleep in her bed due to right shoulder pain decreased >/= 25%   Time 4   Period Weeks   Status On-going  75% of the night           PT Long Term Goals - 11/17/16 1206      PT LONG TERM GOAL #1   Title be independent in advanced HEP   Time 8   Period Weeks   Status New     PT LONG TERM GOAL #2   Title reduce FOTO to < or = to 36% limitation   Time 8   Period Weeks   Status New     PT LONG TERM GOAL #3   Title report < or =  to 3/10 Rt shoulder pain with use for self-care and ADLs   Time 8   Period Weeks   Status New     PT LONG TERM GOAL #4   Title demonstrate Rt shoulder IR AROM to lacking < or = to 2 inches vs the Lt to improve self-care   Time 8   Period Weeks   Status New     PT LONG TERM GOAL #5   Title return to driving herself to therapy due to right shoulder AROM >/= 120 for flexion and abduction   Time 8   Period Weeks   Status New               Plan - 11/26/16 1138    Clinical Impression Statement Pt had most difficulty with eccentric movements: finger ladder mainly, otherwise AAROM movements with the cane were performed without much pain and no substitutions. Pt was issed supine cane flexion for HEP advancement to work on her >90 degree motion.  Pt reported today putting on her deodorant is getting much easier at htis time. Isometrics were also introduced today which pt did pain free.    Rehab Potential Good   Clinical Impairments Affecting Rehab Potential None   PT Frequency 3x / week   PT Treatment/Interventions ADLs/Self Care Home Management;Cryotherapy;Electrical Stimulation;Ultrasound;Moist Heat;Therapeutic activities;Therapeutic exercise;Neuromuscular re-education;Patient/family education;Passive range of motion;Manual techniques;Dry needling;Taping   PT Next Visit Plan Isometrics: give for HEP, pulleys, manual,    Consulted and Agree with Plan of Care Patient      Patient will benefit from skilled therapeutic intervention in order to improve the following deficits and impairments:  Decreased range of motion, Pain, Postural dysfunction, Decreased strength, Impaired flexibility, Decreased activity tolerance  Visit Diagnosis: Chronic right shoulder pain  Muscle weakness (generalized)  Abnormal posture     Problem List Patient Active Problem List   Diagnosis Date Noted  . DVT (deep venous thrombosis) ([place]) 01/07/2013  . Pulmonary embolism ([place]) 01/05/2013  . GERD  (gastroesophageal reflux disease) 01/05/2013    COCHRAN,JENNIFER, PTA 11/26/2016, 11:45 AM  [facility] [address], [place], [place], [address] Phone: [phone]   Fax:  [phone]  Name: Tris Fratus MRN: [id number] Date of Birth:  08/27/1933    

## 2016-11-28 ENCOUNTER — Encounter: Payer: Self-pay | Admitting: Physical Therapy

## 2016-11-28 ENCOUNTER — Ambulatory Visit: Payer: Medicare Other | Admitting: Physical Therapy

## 2016-11-28 DIAGNOSIS — R293 Abnormal posture: Secondary | ICD-10-CM

## 2016-11-28 DIAGNOSIS — M6281 Muscle weakness (generalized): Secondary | ICD-10-CM

## 2016-11-28 DIAGNOSIS — G8929 Other chronic pain: Secondary | ICD-10-CM

## 2016-11-28 DIAGNOSIS — M25511 Pain in right shoulder: Secondary | ICD-10-CM | POA: Diagnosis not present

## 2016-11-28 NOTE — Therapy (Signed)
[facility] [address] [place], [place], [address] Phone: [phone]   Fax:  [phone]  Physical Therapy Treatment  Patient Details  Name: Stacy Valencia MRN: [id number] Date of Birth: 1933-12-21 Referring Provider: Dr. Gerrit Halls  Encounter Date: 11/28/2016      PT End of Session - 11/28/16 1016    Visit Number 6   Number of Visits 10   Date for PT Re-Evaluation 01/12/17   Authorization Type medicare g-code 10th visit   PT Start Time 1016   PT Stop Time 1110   PT Time Calculation (min) 54 min   Activity Tolerance Patient tolerated treatment well   Behavior During Therapy [facility] for tasks assessed/performed      Past Medical History:  Diagnosis Date  . GERD (gastroesophageal reflux disease)   . PE (pulmonary embolism) 2005    Past Surgical History:  Procedure Laterality Date  . ABDOMINAL HYSTERECTOMY    . APPENDECTOMY    . arthroscopic shoulder surgery Right 10/28/2016   decompression of SAD/DCR    There were no vitals filed for this visit.      Subjective Assessment - 11/28/16 1017    Subjective Pt states she notices she can do a little more than before.     Currently in Pain? Yes   Pain Score 4    Pain Location Shoulder   Pain Orientation Right   Pain Descriptors / Indicators Sore   Pain Type Acute pain   Pain Onset 1 to 4 weeks ago   Pain Frequency Intermittent   Aggravating Factors  moving   Pain Relieving Factors not using the arm   Effect of Pain on Daily Activities limited household activities   Multiple Pain Sites No                         [facility] Adult PT Treatment/Exercise - 11/28/16 0001      Shoulder Exercises: Supine   Horizontal ABduction AAROM;Both;15 reps   Theraband Level (Shoulder Horizontal ABduction) --  Used cane   External Rotation AAROM;Both;20 reps   Theraband Level (Shoulder External Rotation) --  with cane   Flexion AAROM;Both;20 reps   Elbows flexed then straight/cane      Shoulder Exercises: Seated   Other Seated Exercises UE ranger; circumduction 20x each direction     Shoulder Exercises: Pulleys   Flexion 3 minutes  Scaption 3 min, internal rotatoinconcurrent review of status     Shoulder Exercises: Isometric Strengthening   Flexion 3X5"   Extension 3X5"   External Rotation 3X5"   Internal Rotation 3X5"   ADduction 3X5"     Cryotherapy   Number Minutes Cryotherapy 10 Minutes   Cryotherapy Location Shoulder   Type of Cryotherapy Ice pack     Manual Therapy   Manual Therapy Passive ROM;Joint mobilization;Soft tissue mobilization   Joint Mobilization distraction to right shoulder, inferior glide grade 3   Soft tissue mobilization anterior right shoulder and posterior deltoid   Passive ROM flexion, abduction, ER, IR in reclined position                  PT Short Term Goals - 11/28/16 1103      PT SHORT TERM GOAL #1   Title be independent in initial HEP   Time 4   Period Weeks   Status Achieved     PT SHORT TERM GOAL #2   Title be able to wear bra due  to pain in right shoulder decreased >/= 25%   Time 4   Period Weeks   Status Achieved     PT SHORT TERM GOAL #3   Title be able to lift right arm to place deodarant under the left arm with minimal pain   Time 4   Period Weeks   Status On-going           PT Long Term Goals - 11/17/16 1206      PT LONG TERM GOAL #1   Title be independent in advanced HEP   Time 8   Period Weeks   Status New     PT LONG TERM GOAL #2   Title reduce FOTO to < or = to 36% limitation   Time 8   Period Weeks   Status New     PT LONG TERM GOAL #3   Title report < or = to 3/10 Rt shoulder pain with use for self-care and ADLs   Time 8   Period Weeks   Status New     PT LONG TERM GOAL #4   Title demonstrate Rt shoulder IR AROM to lacking < or = to 2 inches vs the Lt to improve self-care   Time 8   Period Weeks   Status New     PT LONG TERM  GOAL #5   Title return to driving herself to therapy due to right shoulder AROM >/= 120 for flexion and abduction   Time 8   Period Weeks   Status New               Plan - 11/28/16 1104    Clinical Impression Statement Pt cont to have difficulty with eccentric shoulder flexion.  Pt able to wear bra now, but is unable to buckle it in the back.  States she has her husband help her don it.  Pt performed AAROM on pulleys internal rotation and experienced pain due to bringing it up through full ROM due to not understanding how to pull it up more slowly.  She recovered and did not report increased pain after treatment.  Pt did have cocontracting of back muscles causing some back pain during standing exercises, but that also eased up with sitting and education on doing smaller graduated muscle contractions   Rehab Potential Good   Clinical Impairments Affecting Rehab Potential None   PT Frequency 3x / week   PT Duration 8 weeks   PT Treatment/Interventions ADLs/Self Care Home Management;Cryotherapy;Electrical Stimulation;Ultrasound;Moist Heat;Therapeutic activities;Therapeutic exercise;Neuromuscular re-education;Patient/family education;Passive range of motion;Manual techniques;Dry needling;Taping   PT Next Visit Plan Isometrics: progress as tolerated, pulleys, manual ROM   PT Home Exercise Plan add isometrics for strengthening   Consulted and Agree with Plan of Care Patient      Patient will benefit from skilled therapeutic intervention in order to improve the following deficits and impairments:  Decreased range of motion, Pain, Postural dysfunction, Decreased strength, Impaired flexibility, Decreased activity tolerance  Visit Diagnosis: Chronic right shoulder pain  Muscle weakness (generalized)  Abnormal posture  Acute pain of right shoulder     Problem List Patient Active Problem List   Diagnosis Date Noted  . DVT (deep venous thrombosis) ([place]) 01/07/2013  . Pulmonary  embolism ([place]) 01/05/2013  . GERD (gastroesophageal reflux disease) 01/05/2013    Zannie Cove, PT 11/28/2016, 11:15 AM  [facility] [address], [place], [place], [address] Phone: [phone]   Fax:  [phone]  Name: Stacy  Valencia MRN: [id number] Date of Birth: 1933-11-18

## 2016-12-01 ENCOUNTER — Ambulatory Visit: Payer: Medicare Other | Admitting: Physical Therapy

## 2016-12-01 ENCOUNTER — Encounter: Payer: Self-pay | Admitting: Physical Therapy

## 2016-12-01 DIAGNOSIS — M6281 Muscle weakness (generalized): Secondary | ICD-10-CM

## 2016-12-01 DIAGNOSIS — G8929 Other chronic pain: Secondary | ICD-10-CM | POA: Diagnosis not present

## 2016-12-01 DIAGNOSIS — R293 Abnormal posture: Secondary | ICD-10-CM | POA: Diagnosis not present

## 2016-12-01 DIAGNOSIS — M25511 Pain in right shoulder: Secondary | ICD-10-CM | POA: Diagnosis not present

## 2016-12-01 NOTE — Therapy (Signed)
[facility] [address] [place], [place], [address] Phone: [phone]   Fax:  [phone]  Physical Therapy Treatment  Patient Details  Name: Stacy Valencia MRN: [id number] Date of Birth: 1933/09/25 Referring Provider: Dr. Gerrit Halls  Encounter Date: 12/01/2016      PT End of Session - 12/01/16 1211    Visit Number 7   Number of Visits 10   Date for PT Re-Evaluation 01/12/17   Authorization Type medicare g-code 10th visit   PT Start Time 1146  Pain limited tx today   PT Stop Time 1226   PT Time Calculation (min) 40 min   Activity Tolerance Patient limited by pain   Behavior During Therapy [facility] for tasks assessed/performed      Past Medical History:  Diagnosis Date  . GERD (gastroesophageal reflux disease)   . PE (pulmonary embolism) 2005    Past Surgical History:  Procedure Laterality Date  . ABDOMINAL HYSTERECTOMY    . APPENDECTOMY    . arthroscopic shoulder surgery Right 10/28/2016   decompression of SAD/DCR    There were no vitals filed for this visit.      Subjective Assessment - 12/01/16 1149    Subjective My joints are talking to me today.    Currently in Pain? Yes   Pain Score 6    Pain Location Shoulder   Pain Orientation Right   Pain Descriptors / Indicators Sore   Aggravating Factors  Maybe cold weather   Pain Relieving Factors rest   Multiple Pain Sites No  All joints are aching today.                          [facility] Adult PT Treatment/Exercise - 12/01/16 0001      Shoulder Exercises: Supine   Flexion AAROM;Both;20 reps  Elbows flexed, stopped d/t pain     Shoulder Exercises: Seated   Other Seated Exercises UE ranger; circumduction 20x each direction     Shoulder Exercises: Standing   Other Standing Exercises Cone stack to   Moderate asst from PTA due to weakness and pain     Shoulder Exercises: Pulleys   Flexion 3 minutes  Scaption 3 min     Shoulder Exercises: Isometric Strengthening   Flexion 5X5"   Extension 5X5"   External Rotation 5X5"   Internal Rotation 5X5"   ADduction 5X5"     Moist Heat Therapy   Number Minutes Moist Heat 15 Minutes   Moist Heat Location Shoulder     Manual Therapy   Manual therapy comments Could not tolerate as pain was increasing. Stopped d/t pain.                  PT Short Term Goals - 12/01/16 1153      PT SHORT TERM GOAL #3   Title be able to lift right arm to place deodarant under the left arm with minimal pain   Time 4   Period Weeks   Status On-going  Moderate pain/difficulty     PT SHORT TERM GOAL #4   Title right shoulder strength </= 3-/5 to perfrom overhead activities inclucing reaching in cabinet and wash hair   Time 4   Period Weeks   Status --  Getting better, but still difficult     PT SHORT TERM GOAL #5   Title sleep in her bed due to right shoulder pain decreased >/= 25%   Time 4  Period Weeks   Status On-going           PT Long Term Goals - 11/17/16 1206      PT LONG TERM GOAL #1   Title be independent in advanced HEP   Time 8   Period Weeks   Status New     PT LONG TERM GOAL #2   Title reduce FOTO to < or = to 36% limitation   Time 8   Period Weeks   Status New     PT LONG TERM GOAL #3   Title report < or = to 3/10 Rt shoulder pain with use for self-care and ADLs   Time 8   Period Weeks   Status New     PT LONG TERM GOAL #4   Title demonstrate Rt shoulder IR AROM to lacking < or = to 2 inches vs the Lt to improve self-care   Time 8   Period Weeks   Status New     PT LONG TERM GOAL #5   Title return to driving herself to therapy due to right shoulder AROM >/= 120 for flexion and abduction   Time 8   Period Weeks   Status New               Plan - 12/01/16 1211    Clinical Impression Statement Pt came into PT today with more pain, both in her shoulder but in all her joints. She reports she was aching all night and  did not sleep well. She did not tolerate treatment well today, pain actually increased throughout the session, even PROM. We ended the session early due to her pain levels and put a MHP on her shoulder.    Rehab Potential Good   PT Frequency 3x / week   PT Treatment/Interventions ADLs/Self Care Home Management;Cryotherapy;Electrical Stimulation;Ultrasound;Moist Heat;Therapeutic activities;Therapeutic exercise;Neuromuscular re-education;Patient/family education;Passive range of motion;Manual techniques;Dry needling;Taping   PT Next Visit Plan Gentle strength, supine stabs, continue with pullleys and AROM against gravity.    Consulted and Agree with Plan of Care Patient      Patient will benefit from skilled therapeutic intervention in order to improve the following deficits and impairments:  Decreased range of motion, Pain, Postural dysfunction, Decreased strength, Impaired flexibility, Decreased activity tolerance  Visit Diagnosis: Chronic right shoulder pain  Muscle weakness (generalized)     Problem List Patient Active Problem List   Diagnosis Date Noted  . DVT (deep venous thrombosis) ([place]) 01/07/2013  . Pulmonary embolism ([place]) 01/05/2013  . GERD (gastroesophageal reflux disease) 01/05/2013    Stacy Valencia, PTA 12/01/2016, 12:19 PM  [facility] [address] [place], [place], [address] Phone: [phone]   Fax:  [phone]  Name: Stacy Valencia MRN: [id number] Date of Birth: [birth year]

## 2016-12-03 ENCOUNTER — Ambulatory Visit: Payer: Medicare Other | Admitting: Physical Therapy

## 2016-12-03 ENCOUNTER — Encounter: Payer: Self-pay | Admitting: Physical Therapy

## 2016-12-03 DIAGNOSIS — M6281 Muscle weakness (generalized): Secondary | ICD-10-CM

## 2016-12-03 DIAGNOSIS — R293 Abnormal posture: Secondary | ICD-10-CM | POA: Diagnosis not present

## 2016-12-03 DIAGNOSIS — M25511 Pain in right shoulder: Principal | ICD-10-CM

## 2016-12-03 DIAGNOSIS — G8929 Other chronic pain: Secondary | ICD-10-CM

## 2016-12-03 NOTE — Therapy (Signed)
[facility] [address], [place], [place], [address] Phone: [phone]   Fax:  [phone]  Physical Therapy Treatment  Patient Details  Name: Stacy Valencia MRN: [id number] Date of Birth: 1933-06-23 Referring Provider: Dr. Gerrit Halls  Encounter Date: 12/03/2016      PT End of Session - 12/03/16 1100    Visit Number 8   Number of Visits 10   Date for PT Re-Evaluation 01/12/17   Authorization Type medicare g-code 10th visit   PT Start Time 1059   PT Stop Time 1153   PT Time Calculation (min) 54 min   Activity Tolerance Patient tolerated treatment well   Behavior During Therapy --      Past Medical History:  Diagnosis Date  . GERD (gastroesophageal reflux disease)   . PE (pulmonary embolism) 2005    Past Surgical History:  Procedure Laterality Date  . ABDOMINAL HYSTERECTOMY    . APPENDECTOMY    . arthroscopic shoulder surgery Right 10/28/2016   decompression of SAD/DCR    There were no vitals filed for this visit.      Subjective Assessment - 12/03/16 1101    Subjective Thank you for going easy last session, I felt better as the day went on. Today is better.    Currently in Pain? No/denies   Multiple Pain Sites No                         [facility] Adult PT Treatment/Exercise - 12/03/16 0001      Shoulder Exercises: Supine   Flexion AAROM;Both;20 reps  Elbows flexed and extended with overhead motion   Other Supine Exercises Flexion AAssisted 10x, S-L abduction assited 10x     Shoulder Exercises: Standing   Flexion AAROM;Right;10 reps  Finger ladder     Shoulder Exercises: Pulleys   Flexion 3 minutes  Scaption 3 min     Shoulder Exercises: Isometric Strengthening   Flexion 5X5"   Extension 5X5"   External Rotation 5X5"   Internal Rotation 5X5"   ADduction 5X5"     Moist Heat Therapy   Number Minutes Moist Heat 15 Minutes   Moist Heat Location Shoulder     Manual  Therapy   Passive ROM ER  10x, flexion 10x but painful                   PT Short Term Goals - 12/01/16 1153      PT SHORT TERM GOAL #3   Title be able to lift right arm to place deodarant under the left arm with minimal pain   Time 4   Period Weeks   Status On-going  Moderate pain/difficulty     PT SHORT TERM GOAL #4   Title right shoulder strength </= 3-/5 to perfrom overhead activities inclucing reaching in cabinet and wash hair   Time 4   Period Weeks   Status --  Getting better, but still difficult     PT SHORT TERM GOAL #5   Title sleep in her bed due to right shoulder pain decreased >/= 25%   Time 4   Period Weeks   Status On-going           PT Long Term Goals - 11/17/16 1206      PT LONG TERM GOAL #1   Title be independent in advanced HEP   Time 8   Period Weeks   Status New  PT LONG TERM GOAL #2   Title reduce FOTO to < or = to 36% limitation   Time 8   Period Weeks   Status New     PT LONG TERM GOAL #3   Title report < or = to 3/10 Rt shoulder pain with use for self-care and ADLs   Time 8   Period Weeks   Status New     PT LONG TERM GOAL #4   Title demonstrate Rt shoulder IR AROM to lacking < or = to 2 inches vs the Lt to improve self-care   Time 8   Period Weeks   Status New     PT LONG TERM GOAL #5   Title return to driving herself to therapy due to right shoulder AROM >/= 120 for flexion and abduction   Time 8   Period Weeks   Status New               Plan - 12/03/16 1100    Clinical Impression Statement Pain is much better today, not as limiting as last session. Eccentric motion is most difficult. Pt requires assistance in supine and sidelying strengthening exercises. Pt reports pulling her pants up is 90% improved, only needing assistance for last little pull with the LTUE.     Rehab Potential Good   Clinical Impairments Affecting Rehab Potential None   PT Frequency 3x / week   PT Duration 8 weeks   PT  Treatment/Interventions ADLs/Self Care Home Management;Cryotherapy;Electrical Stimulation;Ultrasound;Moist Heat;Therapeutic activities;Therapeutic exercise;Neuromuscular re-education;Patient/family education;Passive range of motion;Manual techniques;Dry needling;Taping   PT Next Visit Plan Gentle strength, supine stabs, continue with pullleys and AROM against gravity, add second set to mat exercises.    Consulted and Agree with Plan of Care Patient      Patient will benefit from skilled therapeutic intervention in order to improve the following deficits and impairments:  Decreased range of motion, Pain, Postural dysfunction, Decreased strength, Impaired flexibility, Decreased activity tolerance  Visit Diagnosis: Chronic right shoulder pain  Muscle weakness (generalized)     Problem List Patient Active Problem List   Diagnosis Date Noted  . DVT (deep venous thrombosis) ([place]) 01/07/2013  . Pulmonary embolism ([place]) 01/05/2013  . GERD (gastroesophageal reflux disease) 01/05/2013    Micajah Dennin, PTA 12/03/2016, 11:40 AM  [facility] [address] [place], [place], [address] Phone: [phone]   Fax:  [phone]  Name: Stacy Valencia MRN: [id number] Date of Birth: 05-21-1933

## 2016-12-05 ENCOUNTER — Encounter: Payer: Self-pay | Admitting: Physical Therapy

## 2016-12-05 ENCOUNTER — Ambulatory Visit: Payer: Medicare Other | Admitting: Physical Therapy

## 2016-12-05 DIAGNOSIS — R293 Abnormal posture: Secondary | ICD-10-CM

## 2016-12-05 DIAGNOSIS — G8929 Other chronic pain: Secondary | ICD-10-CM | POA: Diagnosis not present

## 2016-12-05 DIAGNOSIS — M25511 Pain in right shoulder: Secondary | ICD-10-CM | POA: Diagnosis not present

## 2016-12-05 DIAGNOSIS — M6281 Muscle weakness (generalized): Secondary | ICD-10-CM

## 2016-12-05 NOTE — Therapy (Signed)
[facility] [address], [place], [place], [address] Phone: [phone]   Fax:  [phone]  Physical Therapy Treatment  Patient Details  Name: Stacy Valencia MRN: [id number] Date of Birth: 10/29/1933 Referring Provider: Dr. Gerrit Halls  Encounter Date: 12/05/2016      PT End of Session - 12/05/16 1021    Visit Number 9   Number of Visits 19   Date for PT Re-Evaluation 01/12/17   Authorization Type medicare g-code 10th visit   PT Start Time 1015   PT Stop Time 1110   PT Time Calculation (min) 55 min   Activity Tolerance Patient tolerated treatment well   Behavior During Therapy [facility] for tasks assessed/performed      Past Medical History:  Diagnosis Date  . GERD (gastroesophageal reflux disease)   . PE (pulmonary embolism) 2005    Past Surgical History:  Procedure Laterality Date  . ABDOMINAL HYSTERECTOMY    . APPENDECTOMY    . arthroscopic shoulder surgery Right 10/28/2016   decompression of SAD/DCR    There were no vitals filed for this visit.      Subjective Assessment - 12/05/16 1023    Subjective Pt reports she is feeling better.  States lowering the arm is the hardest thing and she still cannot hook and unhook bra in the back. Reports it is still hard to do overhead things, but it is getting better. She notices she can do more with the Right arm.   Pertinent History s/P arthroscopic SAD/DCR on 10/28/2016   Limitations House hold activities;Lifting   Diagnostic tests x-ray: OA of Rt shoulder   Patient Stated Goals reduce shoulder pain and be able to use right arm   Currently in Pain? Yes   Pain Score 4    Pain Location Shoulder   Pain Orientation Right   Pain Descriptors / Indicators Sore   Pain Type Surgical pain   Pain Onset More than a month ago   Pain Frequency Intermittent   Aggravating Factors  moving   Pain Relieving Factors rest   Effect of Pain on Daily Activities limited  household activities   Multiple Pain Sites No            [facility] PT Assessment - 12/05/16 0001      AROM   Right Shoulder Flexion 110 Degrees   Right Shoulder ABduction 120 Degrees  with upper trap                     [facility] Adult PT Treatment/Exercise - 12/05/16 0001      Shoulder Exercises: Supine   External Rotation Strengthening;Right;Theraband  purtubations internal/external rotation yellow band   Flexion AAROM;Both;20 reps  Elbows flexed and extended with overhead motion   Other Supine Exercises Flexion AAssisted 10x, abduction assited 2x  S-L 10x   Other Supine Exercises ceiling punches 2x10     Shoulder Exercises: Standing   Flexion Strengthening;Right;5 reps  Finger ladder   ABduction Strengthening;Right;10 reps     Shoulder Exercises: Pulleys   Flexion 3 minutes  Scaption 3 min     Shoulder Exercises: ROM/Strengthening   Other ROM/Strengthening Exercises S-L Rt shoulder external rotatoin - 20x     Moist Heat Therapy   Number Minutes Moist Heat 10 Minutes   Moist Heat Location Shoulder     Manual Therapy   Joint Mobilization distraction to right shoulder, inferior glide grade 3   Passive ROM ER  10x, flexion 10x but  painful                   PT Short Term Goals - 12/05/16 1016      PT SHORT TERM GOAL #1   Title be independent in initial HEP   Time 4   Period Weeks   Status Achieved     PT SHORT TERM GOAL #2   Title be able to wear bra due to pain in right shoulder decreased >/= 25%   Time 4   Period Weeks   Status Achieved     PT SHORT TERM GOAL #3   Title be able to lift right arm to place deodarant under the left arm with minimal pain   Time 4   Period Weeks   Status Achieved     PT SHORT TERM GOAL #4   Title right shoulder strength </= 3-/5 to perfrom overhead activities inclucing reaching in cabinet and wash hair   Time 4   Period Weeks   Status On-going     PT SHORT TERM GOAL #5   Title sleep in her bed due  to right shoulder pain decreased >/= 25%   Baseline 50% less   Time 4   Period Weeks   Status Achieved           PT Long Term Goals - 12/05/16 1018      PT LONG TERM GOAL #1   Title be independent in advanced HEP   Time 8   Status On-going     PT LONG TERM GOAL #2   Title reduce FOTO to < or = to 36% limitation   Baseline 40%   Time 8   Period Weeks   Status On-going     PT LONG TERM GOAL #3   Title report < or = to 3/10 Rt shoulder pain with use for self-care and ADLs   Time 8   Period Weeks   Status On-going     PT LONG TERM GOAL #4   Title demonstrate Rt shoulder IR AROM to lacking < or = to 2 inches vs the Lt to improve self-care   Time 8   Period Weeks   Status On-going     PT LONG TERM GOAL #5   Title return to driving herself to therapy due to right shoulder AROM >/= 120 for flexion and abduction   Baseline 110 degrees   Time 8   Period Weeks   Status On-going               Plan - 12/05/16 1230    Clinical Impression Statement Patient demonstrates improved AROM and overall reports less pain.  She was able to tolerate increased reps of mat exercises today and did not need assistance from PT with AAROM flexion in supine or abduction in side lying.  Attempted abduction in supine, but was too painful.  Eccentric motion continues to be difficult at this time and patient continues to need sskilled PT for increased strength for return to functional activities around the house.   Rehab Potential Good   Clinical Impairments Affecting Rehab Potential None   PT Frequency 2x / week   PT Duration 8 weeks   PT Treatment/Interventions ADLs/Self Care Home Management;Cryotherapy;Electrical Stimulation;Ultrasound;Moist Heat;Therapeutic activities;Therapeutic exercise;Neuromuscular re-education;Patient/family education;Passive range of motion;Manual techniques;Dry needling;Taping   PT Next Visit Plan Gentle strength, supine stabs, continue with pullleys and AROM  against gravity   Consulted and Agree with Plan of Care Patient  Patient will benefit from skilled therapeutic intervention in order to improve the following deficits and impairments:  Decreased range of motion, Pain, Postural dysfunction, Decreased strength, Impaired flexibility, Decreased activity tolerance  Visit Diagnosis: Chronic right shoulder pain  Muscle weakness (generalized)  Abnormal posture       G-Codes - Jan 03, 2017 1239    Functional Assessment Tool Used FOTO 40% limitation   Functional Limitation Other PT primary   Other PT Primary Current Status [id number]) At least 40 percent but less than 60 percent impaired, limited or restricted   Other PT Primary Goal Status [id number]) At least 20 percent but less than 40 percent impaired, limited or restricted      Problem List Patient Active Problem List   Diagnosis Date Noted  . DVT (deep venous thrombosis) ([place]) 01/07/2013  . Pulmonary embolism ([place]) 01/05/2013  . GERD (gastroesophageal reflux disease) 01/05/2013    Zannie Cove, PT 01-03-17, 12:41 PM  [facility] [address] [place], [place], [address] Phone: [phone]   Fax:  [phone]  Name: Stacy Valencia MRN: [id number] Date of Birth: 12-02-1933

## 2016-12-08 ENCOUNTER — Encounter: Payer: Self-pay | Admitting: Physical Therapy

## 2016-12-08 ENCOUNTER — Ambulatory Visit: Payer: Medicare Other | Admitting: Physical Therapy

## 2016-12-08 DIAGNOSIS — G8929 Other chronic pain: Secondary | ICD-10-CM

## 2016-12-08 DIAGNOSIS — R293 Abnormal posture: Secondary | ICD-10-CM

## 2016-12-08 DIAGNOSIS — M6281 Muscle weakness (generalized): Secondary | ICD-10-CM

## 2016-12-08 DIAGNOSIS — M25511 Pain in right shoulder: Secondary | ICD-10-CM | POA: Diagnosis not present

## 2016-12-08 NOTE — Therapy (Signed)
[facility] [address], [place], [place], [address] Phone: [phone]   Fax:  [phone]  Physical Therapy Treatment  Patient Details  Name: Stacy Valencia MRN: [id number] Date of Birth: [birth year] Referring Provider: Dr. Gerrit Halls  Encounter Date: 12/08/2016      PT End of Session - 12/08/16 1023    Visit Number 10   Number of Visits 19   Date for PT Re-Evaluation 01/12/17   Authorization Type medicare g-code 19th visit   PT Start Time 1015   PT Stop Time 1110   PT Time Calculation (min) 55 min   Activity Tolerance Patient tolerated treatment well   Behavior During Therapy [facility] for tasks assessed/performed      Past Medical History:  Diagnosis Date  . GERD (gastroesophageal reflux disease)   . PE (pulmonary embolism) 2005    Past Surgical History:  Procedure Laterality Date  . ABDOMINAL HYSTERECTOMY    . APPENDECTOMY    . arthroscopic shoulder surgery Right 10/28/2016   decompression of SAD/DCR    There were no vitals filed for this visit.      Subjective Assessment - 12/08/16 1020    Subjective My shoulder is feeling better.  I can do more with it without pain.  I am able to use my right hand. I am unable to unfasten bra in the back.    Pertinent History s/P arthroscopic SAD/DCR on 10/28/2016   Limitations House hold activities;Lifting   Diagnostic tests x-ray: OA of Rt shoulder   Patient Stated Goals reduce shoulder pain and be able to use right arm   Currently in Pain? Yes   Pain Score 4    Pain Location Shoulder   Pain Orientation Right   Pain Descriptors / Indicators Sore   Pain Type Surgical pain   Pain Onset More than a month ago   Pain Frequency Intermittent   Aggravating Factors  coming down from reaching overhead   Pain Relieving Factors rest   Effect of Pain on Daily Activities limited household activities            [facility] PT Assessment - 12/08/16 0001       Precautions   Precautions Shoulder   Type of Shoulder Precautions no heavy lifting                     [facility] Adult PT Treatment/Exercise - 12/08/16 0001      Shoulder Exercises: Seated   Extension Strengthening;Both;15 reps;Theraband   Theraband Level (Shoulder Extension) Level 1 (Yellow)   Row Strengthening;Both;15 reps;Theraband   Theraband Level (Shoulder Row) Level 1 (Yellow)     Shoulder Exercises: Sidelying   External Rotation Right;AROM;10 reps;Limitations   External Rotation Limitations therapist guiding scapula   ABduction AAROM;Strengthening;Right;10 reps;Limitations   ABduction Limitations therapist guiding scapula   Other Sidelying Exercises right scapula PNF with therapist giving resistance 10x    Other Sidelying Exercises scapula depression hold 5 sec 10 times     Shoulder Exercises: Standing   Flexion Strengthening;Right;10 reps  Finger ladder; reach to #22     Shoulder Exercises: Pulleys   Flexion 3 minutes  Scaption 3 min   ABduction 3 minutes   ABduction Limitations therapist guided right shoulder with scaption     Modalities   Modalities Moist Heat     Moist Heat Therapy   Number Minutes Moist Heat 10 Minutes   Moist Heat Location Shoulder  right  PT Education - 12/08/16 1050    Education provided No          PT Short Term Goals - 12/05/16 1016      PT SHORT TERM GOAL #1   Title be independent in initial HEP   Time 4   Period Weeks   Status Achieved     PT SHORT TERM GOAL #2   Title be able to wear bra due to pain in right shoulder decreased >/= 25%   Time 4   Period Weeks   Status Achieved     PT SHORT TERM GOAL #3   Title be able to lift right arm to place deodarant under the left arm with minimal pain   Time 4   Period Weeks   Status Achieved     PT SHORT TERM GOAL #4   Title right shoulder strength </= 3-/5 to perfrom overhead activities inclucing reaching in cabinet and wash hair   Time 4    Period Weeks   Status On-going     PT SHORT TERM GOAL #5   Title sleep in her bed due to right shoulder pain decreased >/= 25%   Baseline 50% less   Time 4   Period Weeks   Status Achieved           PT Long Term Goals - 12/08/16 1053      PT LONG TERM GOAL #1   Title be independent in advanced HEP   Time 8   Period Weeks   Status On-going     PT LONG TERM GOAL #2   Title reduce FOTO to < or = to 36% limitation   Baseline 40%   Time 8   Period Weeks   Status On-going     PT LONG TERM GOAL #3   Title report < or = to 3/10 Rt shoulder pain with use for self-care and ADLs   Time 8   Period Weeks   Status On-going     PT LONG TERM GOAL #4   Title demonstrate Rt shoulder IR AROM to lacking < or = to 2 inches vs the Lt to improve self-care   Time 8   Period Weeks   Status On-going     PT LONG TERM GOAL #5   Title return to driving herself to therapy due to right shoulder AROM >/= 120 for flexion and abduction   Baseline 110 degrees   Period Weeks   Status On-going  not driving yet               Plan - 12/08/16 1050    Clinical Impression Statement Patient has difficulty with right shoulder abduction due to pain and weakness.  Patient has weak scapula muscles.  Patient only has pain with shoulder flexion and abduciton coming downward. Patient will benefit form skilled therapy to reduce pain and improve right shoulder ROM and strength.    Rehab Potential Good   Clinical Impairments Affecting Rehab Potential None   PT Frequency 2x / week   PT Duration 8 weeks   PT Treatment/Interventions ADLs/Self Care Home Management;Cryotherapy;Electrical Stimulation;Ultrasound;Moist Heat;Therapeutic activities;Therapeutic exercise;Neuromuscular re-education;Patient/family education;Passive range of motion;Manual techniques;Dry needling;Taping   PT Next Visit Plan Gentle strength, supine stabs, continue with pullleys and AROM against gravity; write MD updated progress note  due to appointment on Thursday; KX modifier due to PT prior to surgery   PT Home Exercise Plan progress   Consulted and Agree with Plan of Care Patient  Patient will benefit from skilled therapeutic intervention in order to improve the following deficits and impairments:  Decreased range of motion, Pain, Postural dysfunction, Decreased strength, Impaired flexibility, Decreased activity tolerance  Visit Diagnosis: Chronic right shoulder pain  Muscle weakness (generalized)  Abnormal posture     Problem List Patient Active Problem List   Diagnosis Date Noted  . DVT (deep venous thrombosis) ([place]) 01/07/2013  . Pulmonary embolism ([place]) 01/05/2013  . GERD (gastroesophageal reflux disease) 01/05/2013    Earlie Counts, PT 12/08/16 10:56 AM   [facility] [address], [place], [place], [address] Phone: [phone]   Fax:  [phone]  Name: Ladye Woolcock MRN: [id number] Date of Birth: Jul 02, 1933

## 2016-12-10 ENCOUNTER — Encounter: Payer: Self-pay | Admitting: Physical Therapy

## 2016-12-10 ENCOUNTER — Ambulatory Visit: Payer: Medicare Other | Admitting: Physical Therapy

## 2016-12-10 DIAGNOSIS — M6281 Muscle weakness (generalized): Secondary | ICD-10-CM | POA: Diagnosis not present

## 2016-12-10 DIAGNOSIS — G8929 Other chronic pain: Secondary | ICD-10-CM | POA: Diagnosis not present

## 2016-12-10 DIAGNOSIS — R293 Abnormal posture: Secondary | ICD-10-CM

## 2016-12-10 DIAGNOSIS — M25511 Pain in right shoulder: Secondary | ICD-10-CM | POA: Diagnosis not present

## 2016-12-10 DIAGNOSIS — M75111 Incomplete rotator cuff tear or rupture of right shoulder, not specified as traumatic: Secondary | ICD-10-CM | POA: Diagnosis not present

## 2016-12-10 DIAGNOSIS — M19011 Primary osteoarthritis, right shoulder: Secondary | ICD-10-CM | POA: Diagnosis not present

## 2016-12-10 DIAGNOSIS — Z4789 Encounter for other orthopedic aftercare: Secondary | ICD-10-CM | POA: Diagnosis not present

## 2016-12-10 NOTE — Therapy (Signed)
[facility] [address] [place], [place], [address] Phone: [phone]   Fax:  [phone]  Physical Therapy Treatment  Patient Details  Name: Stacy Valencia MRN: [id number] Date of Birth: Oct 04, 1933 Referring Provider: Dr. Gerrit Halls  Encounter Date: 12/10/2016      PT End of Session - 12/10/16 1151    Visit Number 11   Number of Visits 19   Date for PT Re-Evaluation 01/12/17   Authorization Type medicare g-code 19th visit, KX now   PT Start Time 1150   PT Stop Time 1240   PT Time Calculation (min) 50 min   Activity Tolerance Patient tolerated treatment well   Behavior During Therapy [facility] for tasks assessed/performed      Past Medical History:  Diagnosis Date  . GERD (gastroesophageal reflux disease)   . PE (pulmonary embolism) 2005    Past Surgical History:  Procedure Laterality Date  . ABDOMINAL HYSTERECTOMY    . APPENDECTOMY    . arthroscopic shoulder surgery Right 10/28/2016   decompression of SAD/DCR    There were no vitals filed for this visit.      Subjective Assessment - 12/10/16 1152    Subjective I see the MD this afternoon.    Currently in Pain? Yes   Pain Score 2    Pain Location Shoulder   Pain Orientation Right   Pain Descriptors / Indicators Aching   Aggravating Factors  Lowering motions, eccentrics   Pain Relieving Factors rest   Multiple Pain Sites No            [facility] PT Assessment - 12/10/16 0001      AROM   Right Shoulder Flexion 116 Degrees   Right Shoulder ABduction --  Very painful to to in sitting, in sidelying pt can lift arm   Right Shoulder Internal Rotation --  Reaches to upper lumbar   Right Shoulder External Rotation --  Can touch the back of her head with minor difficulty                     [facility] Adult PT Treatment/Exercise - 12/10/16 0001      Shoulder Exercises: Supine   Flexion Strengthening;Right;10 reps;Weights   Shoulder  Flexion Weight (lbs) 1  punching movement     Shoulder Exercises: Seated   External Rotation Strengthening;Both;20 reps;Theraband   Theraband Level (Shoulder External Rotation) Level 1 (Yellow)     Shoulder Exercises: Sidelying   External Rotation Strengthening;Right;10 reps;Weights   External Rotation Weight (lbs) 1   External Rotation Limitations therapist guiding rotation   ABduction AROM;Strengthening;Right;10 reps;Weights   ABduction Weight (lbs) 1   for first 5 reps, then AROM second 5     Shoulder Exercises: Standing   Flexion Strengthening;Right;10 reps  Finger ladder; reach to #22   Extension Strengthening;Both;20 reps;Theraband   Theraband Level (Shoulder Extension) Level 1 (Yellow)   Row Strengthening;Both;20 reps;Theraband   Theraband Level (Shoulder Row) Level 1 (Yellow)   Other Standing Exercises 1# to first shelf 10x2   No assistance required     Shoulder Exercises: Pulleys   Flexion 3 minutes  Scaption 3 min   ABduction 3 minutes     Shoulder Exercises: ROM/Strengthening   UBE (Upper Arm Bike) L0 x2 min forward     Moist Heat Therapy   Number Minutes Moist Heat 15 Minutes   Moist Heat Location Shoulder  right  PT Short Term Goals - 12/10/16 1232      PT SHORT TERM GOAL #1   Title be independent in initial HEP   Time 4   Period Weeks   Status Achieved     PT SHORT TERM GOAL #2   Title be able to wear bra due to pain in right shoulder decreased >/= 25%   Time 4   Period Weeks   Status Achieved     PT SHORT TERM GOAL #3   Title be able to lift right arm to place deodarant under the left arm with minimal pain   Time 4   Period Weeks   Status Achieved     PT SHORT TERM GOAL #4   Title right shoulder strength </= 3-/5 to perfrom overhead activities inclucing reaching in cabinet and wash hair   Time 4   Period Weeks   Status Partially Met  Patient can reach better to first level shelf at this time     PT SHORT TERM  GOAL #5   Title sleep in her bed due to right shoulder pain decreased >/= 25%   Baseline 50% less   Time 4   Period Weeks   Status Achieved           PT Long Term Goals - 12/08/16 1053      PT LONG TERM GOAL #1   Title be independent in advanced HEP   Time 8   Period Weeks   Status On-going     PT LONG TERM GOAL #2   Title reduce FOTO to < or = to 36% limitation   Baseline 40%   Time 8   Period Weeks   Status On-going     PT LONG TERM GOAL #3   Title report < or = to 3/10 Rt shoulder pain with use for self-care and ADLs   Time 8   Period Weeks   Status On-going     PT LONG TERM GOAL #4   Title demonstrate Rt shoulder IR AROM to lacking < or = to 2 inches vs the Lt to improve self-care   Time 8   Period Weeks   Status On-going     PT LONG TERM GOAL #5   Title return to driving herself to therapy due to right shoulder AROM >/= 120 for flexion and abduction   Baseline 110 degrees   Period Weeks   Status On-going  not driving yet               Plan - 12/10/16 1152    Clinical Impression Statement Pt's AROM improving, along with verbal reports that she is using it more and more at home.  Remains weak in RTUE, especially in abduction.    Rehab Potential Good   Clinical Impairments Affecting Rehab Potential None   PT Frequency 2x / week   PT Duration 8 weeks   PT Treatment/Interventions ADLs/Self Care Home Management;Cryotherapy;Electrical Stimulation;Ultrasound;Moist Heat;Therapeutic activities;Therapeutic exercise;Neuromuscular re-education;Patient/family education;Passive range of motion;Manual techniques;Dry needling;Taping   PT Next Visit Plan Gentle strength, supine stabs, see what MD says   Consulted and Agree with Plan of Care --      Patient will benefit from skilled therapeutic intervention in order to improve the following deficits and impairments:  Decreased range of motion, Pain, Postural dysfunction, Decreased strength, Impaired flexibility,  Decreased activity tolerance  Visit Diagnosis: Chronic right shoulder pain  Muscle weakness (generalized)  Abnormal posture     Problem List Patient Active  Problem List   Diagnosis Date Noted  . DVT (deep venous thrombosis) ([place]) 01/07/2013  . Pulmonary embolism ([place]) 01/05/2013  . GERD (gastroesophageal reflux disease) 01/05/2013    Estha Few, PTA 12/10/2016, 1:38 PM  [facility] [address], [place], [place], [address] Phone: [phone]   Fax:  [phone]  Name: Sarit Sparano MRN: [id number] Date of Birth: [birth year]

## 2016-12-12 ENCOUNTER — Encounter: Payer: Medicare Other | Admitting: Physical Therapy

## 2016-12-18 ENCOUNTER — Other Ambulatory Visit: Payer: Self-pay | Admitting: Oncology

## 2016-12-18 DIAGNOSIS — I82409 Acute embolism and thrombosis of unspecified deep veins of unspecified lower extremity: Secondary | ICD-10-CM

## 2016-12-23 ENCOUNTER — Telehealth: Payer: Self-pay | Admitting: Oncology

## 2016-12-23 ENCOUNTER — Ambulatory Visit (HOSPITAL_BASED_OUTPATIENT_CLINIC_OR_DEPARTMENT_OTHER): Payer: Medicare Other | Admitting: Oncology

## 2016-12-23 VITALS — BP 146/80 | HR 94 | Temp 97.7°F | Resp 17 | Ht 68.0 in | Wt 212.8 lb

## 2016-12-23 DIAGNOSIS — Z86711 Personal history of pulmonary embolism: Secondary | ICD-10-CM

## 2016-12-23 DIAGNOSIS — Z86718 Personal history of other venous thrombosis and embolism: Secondary | ICD-10-CM

## 2016-12-23 DIAGNOSIS — I2699 Other pulmonary embolism without acute cor pulmonale: Secondary | ICD-10-CM

## 2016-12-23 NOTE — Progress Notes (Signed)
Hematology and Oncology Follow Up Visit  Stacy Valencia [id number] Mar 23, 1933 83 y.o. 12/23/2016 10:08 AM Stacy Fairly, MD (Inactive)No ref. provider found   Principle Diagnosis: 83 year old woman with history of a pulmonary embolus diagnosed in January of 2014 where she presented with symptoms of shortness of breath and found to have a saddle pulmonary embolus. It appears to be provoked after a long car ride.   Prior Therapy: She is status post anticoagulation from her previous pulmonary embolus back in 2005.  Current therapy: Xarelto 20 mg daily since January of 2014.  Interim History: Stacy Valencia presents today for a followup visit with her husband. Since the last visit, she underwent right shoulder operation which she tolerated poorly well. She did not have any postoperative bleeding or complications. She is currently receiving physical therapy and rehabilitating well. She continues on Xarelto without any complications. She denies any specific side effects associated with this medication. She is not reporting any bleeding. She denied epistaxis, hematochezia, hematuria or hemoptysis. She denied any thrombotic events.  She does not report any headaches or blurry vision or double vision. Has not reported any chest pain or cough. Has not reported any nausea or vomiting or abdominal discomfort. Has not reported any hematochezia or melena. Has not reported easy bruisability, petechiae or rashes. She has not reported any syncope or change in her multiple status. Rest of her review of systems unremarkable.  Medications: I have reviewed the patient's current medications.  Current Outpatient Prescriptions  Medication Sig Dispense Refill  . alendronate (FOSAMAX) 70 MG tablet Take 70 mg by mouth every Sunday. Take with a full glass of water on an empty stomach.    Marland Kitchen omeprazole (PRILOSEC) 20 MG capsule Take 20 mg by mouth daily.    Stacy Valencia 20 MG TABS tablet TAKE ONE TABLET BY MOUTH DAILY 30 tablet 5    No current facility-administered medications for this visit.      Allergies:  Allergies  Allergen Reactions  . Sulfa Antibiotics Nausea And Vomiting    Past Medical History, Surgical history, Social history, and Family History were reviewed and updated.   Physical Exam: Blood pressure (!) 146/80, pulse 94, temperature 97.7 F (36.5 C), temperature source Oral, resp. rate 17, height 5\' 8"  (1.727 m), weight 212 lb 12.8 oz (96.5 kg), SpO2 96 %. ECOG: 0 General appearance: Alert, awake woman without distress. Head: Normocephalic, without obvious abnormality no thrush or ulcers. Neck: no adenopathy Lymph nodes: Cervical, supraclavicular, and axillary nodes normal. Heart:regular rate and rhythm, S1, S2 normal, no murmur, click, rub or gallop Lung:chest clear, no wheezing, rales, normal symmetric air entry Abdomin: soft, non-tender, without masses or organomegaly no rebound or guarding. EXT:no erythema, induration, or nodules   Lab Results: Lab Results  Component Value Date   WBC 7.7 12/20/2013   HGB 14.7 12/20/2013   HCT 43.9 12/20/2013   MCV 98.4 12/20/2013   PLT 247 12/20/2013     Chemistry      Component Value Date/Time   NA 138 12/20/2013 0952   K 4.3 12/20/2013 0952   CL 106 01/27/2013 1033   CO2 22 12/20/2013 0952   BUN 20.8 12/20/2013 0952   CREATININE 1.0 12/20/2013 0952      Component Value Date/Time   CALCIUM 9.7 12/20/2013 0952   ALKPHOS 84 12/20/2013 0952   AST 21 12/20/2013 0952   ALT 20 12/20/2013 0952   BILITOT 0.54 12/20/2013 0952       Impression and Plan:   83 year old  woman with:  1. History of thrombosis on two separate episodes nine years apart. The first one in 2005 and the other one in 2014 were provoked by a long travel history. She had been on Xarelto since January of 2014. No clear cut inherited thrombophilia noted.  Risks and benefits of Xarelto continuation were reviewed in today's visit. These risks include of GI bleeding along  other bleeding complications. The benefit will be for further prevention for potentially life threatening blood clots. She is agreeable to continue Xarelto which is likely will be indefinite.  2. Follow-up: Follow-up will be in January 2019 sooner if needed to.  [facility], MD 1/2/201810:08 AM

## 2016-12-23 NOTE — Telephone Encounter (Signed)
Appointments scheduled per 1/2 LOS. Patient given AVS report and calendars with future scheduled appointments. °

## 2016-12-24 ENCOUNTER — Ambulatory Visit: Payer: Medicare Other | Attending: Physician Assistant | Admitting: Physical Therapy

## 2016-12-24 ENCOUNTER — Encounter: Payer: Self-pay | Admitting: Physical Therapy

## 2016-12-24 DIAGNOSIS — R293 Abnormal posture: Secondary | ICD-10-CM | POA: Diagnosis not present

## 2016-12-24 DIAGNOSIS — G8929 Other chronic pain: Secondary | ICD-10-CM | POA: Diagnosis not present

## 2016-12-24 DIAGNOSIS — M25511 Pain in right shoulder: Secondary | ICD-10-CM

## 2016-12-24 DIAGNOSIS — M6281 Muscle weakness (generalized): Secondary | ICD-10-CM | POA: Diagnosis not present

## 2016-12-24 NOTE — Therapy (Signed)
[facility] [address] [place], [place], [address] Phone: [phone]   Fax:  [phone]  Physical Therapy Treatment  Patient Details  Name: Stacy Valencia MRN: [id number] Date of Birth: October 09, 1933 Referring Provider: Dr. Gerrit Halls  Encounter Date: 12/24/2016      PT End of Session - 12/24/16 1011    Visit Number 12   Number of Visits 19   Date for PT Re-Evaluation 01/12/17   Authorization Type medicare g-code 19th visit, KX 27 visits (restarted in Jan)   PT Start Time 1012   PT Stop Time 1116   PT Time Calculation (min) 64 min   Activity Tolerance Patient tolerated treatment well   Behavior During Therapy [facility] for tasks assessed/performed      Past Medical History:  Diagnosis Date  . GERD (gastroesophageal reflux disease)   . PE (pulmonary embolism) 2005    Past Surgical History:  Procedure Laterality Date  . ABDOMINAL HYSTERECTOMY    . APPENDECTOMY    . arthroscopic shoulder surgery Right 10/28/2016   decompression of SAD/DCR    There were no vitals filed for this visit.      Subjective Assessment - 12/24/16 1012    Subjective Pt states she got some cream from the doctor for pain, but doesn't notice a difference yet.  Denies pain currently but statres it ached all night.  States she was able to reach hand behind her to [facility] her bra.   Pertinent History s/P arthroscopic SAD/DCR on 10/28/2016   Limitations House hold activities;Lifting   Diagnostic tests x-ray: OA of Rt shoulder   Patient Stated Goals reduce shoulder pain and be able to use right arm   Currently in Pain? No/denies                         [facility] Adult PT Treatment/Exercise - 12/24/16 0001      Shoulder Exercises: Supine   Flexion Strengthening;Right;20 reps;Weights   Shoulder Flexion Weight (lbs) 1  punching movement     Shoulder Exercises: Sidelying   External Rotation Strengthening;Right;Weights;20 reps    External Rotation Weight (lbs) 1   ABduction AROM;Strengthening;Right;10 reps;20 reps     Shoulder Exercises: Standing   External Rotation Strengthening;Right;10 reps;Theraband   Theraband Level (Shoulder External Rotation) Level 1 (Yellow)   Internal Rotation Strengthening;Right;10 reps;Theraband   Theraband Level (Shoulder Internal Rotation) Level 1 (Yellow)   Flexion Strengthening;Right;10 reps  Finger ladder; reach to #22   Extension Strengthening;Both;20 reps;Theraband   Theraband Level (Shoulder Extension) Level 1 (Yellow)   Row Strengthening;Both;20 reps;Theraband   Theraband Level (Shoulder Row) Level 1 (Yellow)   Other Standing Exercises 1# to first shelf 10x2   No assistance required     Shoulder Exercises: Pulleys   Flexion 3 minutes  Scaption 3 min   ABduction 3 minutes     Shoulder Exercises: ROM/Strengthening   Other ROM/Strengthening Exercises wall slides flexion - 20x each     Moist Heat Therapy   Number Minutes Moist Heat 12 Minutes   Moist Heat Location Shoulder                  PT Short Term Goals - 12/24/16 1031      PT SHORT TERM GOAL #4   Title right shoulder strength </= 3-/5 to perfrom overhead activities inclucing reaching in cabinet and wash hair   Time 4   Period Weeks   Status Partially Met  PT Long Term Goals - 12/24/16 1032      PT LONG TERM GOAL #1   Title be independent in advanced HEP   Time 8   Period Weeks   Status On-going               Plan - 12/24/16 1013    Clinical Impression Statement Pt is improving in endurance and able to do more.  Continues to have difficulty lifting overhead with Rt UE.  Skilled PT needed to return to all functional activities.   Rehab Potential Good   Clinical Impairments Affecting Rehab Potential None   PT Frequency 2x / week   PT Duration 8 weeks   PT Treatment/Interventions ADLs/Self Care Home Management;Cryotherapy;Electrical Stimulation;Ultrasound;Moist  Heat;Therapeutic activities;Therapeutic exercise;Neuromuscular re-education;Patient/family education;Passive range of motion;Manual techniques;Dry needling;Taping   PT Next Visit Plan Gentle strength, supine stabs, see what MD says   PT Home Exercise Plan progress   Consulted and Agree with Plan of Care Patient      Patient will benefit from skilled therapeutic intervention in order to improve the following deficits and impairments:  Decreased range of motion, Pain, Postural dysfunction, Decreased strength, Impaired flexibility, Decreased activity tolerance  Visit Diagnosis: Chronic right shoulder pain  Muscle weakness (generalized)  Abnormal posture  Acute pain of right shoulder     Problem List Patient Active Problem List   Diagnosis Date Noted  . DVT (deep venous thrombosis) ([place]) 01/07/2013  . Pulmonary embolism ([place]) 01/05/2013  . GERD (gastroesophageal reflux disease) 01/05/2013    Zannie Cove, PT 12/24/2016, 11:00 AM  [facility] [address] [place], [place], [address] Phone: [phone]   Fax:  [phone]  Name: Kourtnei Rauber MRN: [id number] Date of Birth: [birth year]

## 2016-12-29 ENCOUNTER — Encounter: Payer: Self-pay | Admitting: Physical Therapy

## 2016-12-29 ENCOUNTER — Ambulatory Visit: Payer: Medicare Other | Admitting: Physical Therapy

## 2016-12-29 DIAGNOSIS — M6281 Muscle weakness (generalized): Secondary | ICD-10-CM | POA: Diagnosis not present

## 2016-12-29 DIAGNOSIS — G8929 Other chronic pain: Secondary | ICD-10-CM | POA: Diagnosis not present

## 2016-12-29 DIAGNOSIS — R293 Abnormal posture: Secondary | ICD-10-CM

## 2016-12-29 DIAGNOSIS — M25511 Pain in right shoulder: Secondary | ICD-10-CM | POA: Diagnosis not present

## 2016-12-29 NOTE — Therapy (Signed)
[facility] [address] [place], [place], [address] Phone: [phone]   Fax:  [phone]  Physical Therapy Treatment  Patient Details  Name: Stacy Valencia MRN: [id number] Date of Birth: [birth year] Referring Provider: Dr. Gerrit Halls  Encounter Date: 12/29/2016      PT End of Session - 12/29/16 1023    Visit Number 13   Number of Visits 19   Date for PT Re-Evaluation 01/12/17   Authorization Type medicare g-code 19th visit, KX 27 visits (restarted in Jan)   PT Start Time 1020  31 min of TX, 10 min late start   PT Stop Time 1106   PT Time Calculation (min) 46 min   Activity Tolerance Patient tolerated treatment well   Behavior During Therapy [facility] for tasks assessed/performed      Past Medical History:  Diagnosis Date  . GERD (gastroesophageal reflux disease)   . PE (pulmonary embolism) 2005    Past Surgical History:  Procedure Laterality Date  . ABDOMINAL HYSTERECTOMY    . APPENDECTOMY    . arthroscopic shoulder surgery Right 10/28/2016   decompression of SAD/DCR    There were no vitals filed for this visit.      Subjective Assessment - 12/29/16 1024    Subjective About the same pain wise but " I am definitely using it more."     Currently in Pain? Yes   Pain Score 2    Pain Location Shoulder   Pain Orientation Right   Pain Descriptors / Indicators Aching   Aggravating Factors  Eccentrics   Pain Relieving Factors rest   Multiple Pain Sites No                         [facility] Adult PT Treatment/Exercise - 12/29/16 0001      Shoulder Exercises: Supine   Horizontal ABduction Strengthening;Both;20 reps;Theraband   Theraband Level (Shoulder Horizontal ABduction) Level 1 (Yellow)   Flexion Strengthening;Right;20 reps;Weights   Shoulder Flexion Weight (lbs) 2     Shoulder Exercises: Standing   External Rotation Strengthening;Right;20 reps;Theraband   Theraband Level (Shoulder  External Rotation) Level 1 (Yellow)   Internal Rotation Strengthening;Right;20 reps;Theraband   Theraband Level (Shoulder Internal Rotation) Level 1 (Yellow)   Flexion Strengthening;Right;10 reps  Finger ladder;added 1# to wrist   Row Strengthening;Right;20 reps;Theraband   Theraband Level (Shoulder Row) Level 1 (Yellow)   Other Standing Exercises 1# to first shelf 10x2   No assistance required     Shoulder Exercises: Pulleys   Flexion 3 minutes     Moist Heat Therapy   Number Minutes Moist Heat 15 Minutes   Moist Heat Location Shoulder                  PT Short Term Goals - 12/29/16 1027      PT SHORT TERM GOAL #4   Title right shoulder strength </= 3-/5 to perfrom overhead activities inclucing reaching in cabinet and wash hair   Time 4   Period Weeks   Status Partially Met  Washing hair still mainly with LT UE           PT Long Term Goals - 12/29/16 1028      PT LONG TERM GOAL #1   Title be independent in advanced HEP   Time 8   Period Weeks   Status On-going               Plan -  12/29/16 1024    Clinical Impression Statement On an achy day pt was able to increase her work load some and not have any increase in her pain. Eccentric strength remains difficult but shows sign of improvement. Still uses more LTUE to wash hair than RTUE. Scapula weakness shown during horizontal abd as arm got very shakey.     Rehab Potential Good   Clinical Impairments Affecting Rehab Potential None   PT Frequency 2x / week   PT Duration 8 weeks   PT Treatment/Interventions ADLs/Self Care Home Management;Cryotherapy;Electrical Stimulation;Ultrasound;Moist Heat;Therapeutic activities;Therapeutic exercise;Neuromuscular re-education;Patient/family education;Passive range of motion;Manual techniques;Dry needling;Taping   PT Next Visit Plan Continue with shoulder strength, scap stabs, eccentics   Consulted and Agree with Plan of Care Patient      Patient will benefit  from skilled therapeutic intervention in order to improve the following deficits and impairments:  Decreased range of motion, Pain, Postural dysfunction, Decreased strength, Impaired flexibility, Decreased activity tolerance  Visit Diagnosis: Chronic right shoulder pain  Muscle weakness (generalized)  Abnormal posture     Problem List Patient Active Problem List   Diagnosis Date Noted  . DVT (deep venous thrombosis) ([place]) 01/07/2013  . Pulmonary embolism ([place]) 01/05/2013  . GERD (gastroesophageal reflux disease) 01/05/2013    COCHRAN,JENNIFER, PTA 12/29/2016, 10:54 AM  [facility] [address], [place], [place], [address] Phone: [phone]   Fax:  [phone]  Name: Stacy Valencia MRN: [id number] Date of Birth: [birth year]

## 2016-12-31 ENCOUNTER — Encounter: Payer: Self-pay | Admitting: Physical Therapy

## 2016-12-31 ENCOUNTER — Ambulatory Visit: Payer: Medicare Other | Admitting: Physical Therapy

## 2016-12-31 DIAGNOSIS — M6281 Muscle weakness (generalized): Secondary | ICD-10-CM | POA: Diagnosis not present

## 2016-12-31 DIAGNOSIS — M25511 Pain in right shoulder: Secondary | ICD-10-CM | POA: Diagnosis not present

## 2016-12-31 DIAGNOSIS — R293 Abnormal posture: Secondary | ICD-10-CM | POA: Diagnosis not present

## 2016-12-31 DIAGNOSIS — G8929 Other chronic pain: Secondary | ICD-10-CM | POA: Diagnosis not present

## 2016-12-31 NOTE — Therapy (Signed)
[facility] [address], [place], [place], [address] Phone: [phone]   Fax:  [phone]  Physical Therapy Treatment  Patient Details  Name: Stacy Valencia MRN: [id number] Date of Birth: March 10, 1933 Referring Provider: Dr. Gerrit Halls  Encounter Date: 12/31/2016      PT End of Session - 12/31/16 1102    Visit Number 14   Number of Visits 19   Date for PT Re-Evaluation 01/12/17   Authorization Type medicare g-code 19th visit, KX 27 visits (restarted in Jan)   PT Start Time 1100   PT Stop Time 1142   PT Time Calculation (min) 42 min   Activity Tolerance Patient tolerated treatment well   Behavior During Therapy [facility] for tasks assessed/performed      Past Medical History:  Diagnosis Date  . GERD (gastroesophageal reflux disease)   . PE (pulmonary embolism) 2005    Past Surgical History:  Procedure Laterality Date  . ABDOMINAL HYSTERECTOMY    . APPENDECTOMY    . arthroscopic shoulder surgery Right 10/28/2016   decompression of SAD/DCR    There were no vitals filed for this visit.      Subjective Assessment - 12/31/16 1101    Subjective Feeling a little sore and achy today.    Pertinent History s/P arthroscopic SAD/DCR on 10/28/2016   Limitations House hold activities;Lifting   Diagnostic tests x-ray: OA of Rt shoulder   Patient Stated Goals reduce shoulder pain and be able to use right arm   Currently in Pain? Yes   Pain Score 3    Pain Location Shoulder   Pain Orientation Right   Pain Descriptors / Indicators Aching   Pain Type Surgical pain   Pain Onset More than a month ago   Pain Frequency Intermittent                         [facility] Adult PT Treatment/Exercise - 12/31/16 0001      Shoulder Exercises: Supine   Horizontal ABduction Strengthening;Both;20 reps;Theraband   Theraband Level (Shoulder Horizontal ABduction) Level 1 (Yellow)   Flexion Strengthening;Right;20  reps;Weights   Shoulder Flexion Weight (lbs) 2  punching movement     Shoulder Exercises: Standing   External Rotation Strengthening;Right;20 reps;Theraband   Theraband Level (Shoulder External Rotation) Level 1 (Yellow)   Internal Rotation Strengthening;Right;20 reps;Theraband   Theraband Level (Shoulder Internal Rotation) Level 1 (Yellow)   Flexion Strengthening;Right;10 reps  Finger ladder;added 1# to wrist   Row Strengthening;Right;20 reps;Theraband   Theraband Level (Shoulder Row) Level 1 (Yellow)     Shoulder Exercises: Pulleys   Flexion 3 minutes   ABduction 3 minutes     Shoulder Exercises: ROM/Strengthening   Other ROM/Strengthening Exercises UE ranger  Flexion only; Abduction painful     Manual Therapy   Manual Therapy Soft tissue mobilization   Soft tissue mobilization Rt pec minor, deltoid, upper trap                  PT Short Term Goals - 12/31/16 1103      PT SHORT TERM GOAL #4   Title right shoulder strength </= 3-/5 to perfrom overhead activities inclucing reaching in cabinet and wash hair   Time 4   Period Weeks   Status Partially Met           PT Long Term Goals - 12/31/16 1103      PT LONG TERM GOAL #3   Title report <  or = to 3/10 Rt shoulder pain with use for self-care and ADLs   Time 8   Period Weeks   Status On-going     PT LONG TERM GOAL #4   Title demonstrate Rt shoulder IR AROM to lacking < or = to 2 inches vs the Lt to improve self-care   Time 8   Period Weeks     PT LONG TERM GOAL #5   Title return to driving herself to therapy due to right shoulder AROM >/= 120 for flexion and abduction   Time 8   Period Weeks   Status On-going               Plan - 12/31/16 1143    Clinical Impression Statement Pt reports having some increased tightness most likly from increased strengthening exercises and use. Pt able to tolerate all exercises well, limited only by back pain. Manual soft tissue mobilization to Rt shoulder to  decrease tightness and increase muscle flexibility. Pt reponded well. Pt will continue to benefit from skilled therapy for shoulder strength and stability.    Rehab Potential Good   Clinical Impairments Affecting Rehab Potential None   PT Frequency 2x / week   PT Duration 8 weeks   PT Treatment/Interventions ADLs/Self Care Home Management;Cryotherapy;Electrical Stimulation;Ultrasound;Moist Heat;Therapeutic activities;Therapeutic exercise;Neuromuscular re-education;Patient/family education;Passive range of motion;Manual techniques;Dry needling;Taping   PT Next Visit Plan Continue with shoulder strength, scap stabs, eccentics   Consulted and Agree with Plan of Care Patient      Patient will benefit from skilled therapeutic intervention in order to improve the following deficits and impairments:  Decreased range of motion, Pain, Postural dysfunction, Decreased strength, Impaired flexibility, Decreased activity tolerance  Visit Diagnosis: Chronic right shoulder pain  Muscle weakness (generalized)     Problem List Patient Active Problem List   Diagnosis Date Noted  . DVT (deep venous thrombosis) ([place]) 01/07/2013  . Pulmonary embolism ([place]) 01/05/2013  . GERD (gastroesophageal reflux disease) 01/05/2013    [name] 12/31/2016, 12:37 PM  [facility] [address], [place], [place], [address] Phone: [phone]   Fax:  [phone]  Name: Montina Dorrance MRN: [id number] Date of Birth: [birth year]

## 2017-01-06 ENCOUNTER — Ambulatory Visit: Payer: Medicare Other | Admitting: Physical Therapy

## 2017-01-06 DIAGNOSIS — G8929 Other chronic pain: Secondary | ICD-10-CM

## 2017-01-06 DIAGNOSIS — R293 Abnormal posture: Secondary | ICD-10-CM

## 2017-01-06 DIAGNOSIS — M25511 Pain in right shoulder: Secondary | ICD-10-CM | POA: Diagnosis not present

## 2017-01-06 DIAGNOSIS — M6281 Muscle weakness (generalized): Secondary | ICD-10-CM

## 2017-01-06 NOTE — Therapy (Signed)
[facility] [address], [place], [place], [address] Phone: [phone]   Fax:  [phone]  Physical Therapy Treatment  Patient Details  Name: Stacy Valencia MRN: [id number] Date of Birth: 01-Apr-1933 Referring Provider: Dr. Gerrit Halls  Encounter Date: 01/06/2017      PT End of Session - 01/06/17 1051    Visit Number 15   Number of Visits 19   Date for PT Re-Evaluation 01/12/17   Authorization Type medicare g-code 19th visit   PT Start Time 1010   PT Stop Time 1100   PT Time Calculation (min) 50 min   Activity Tolerance Patient tolerated treatment well      Past Medical History:  Diagnosis Date  . GERD (gastroesophageal reflux disease)   . PE (pulmonary embolism) 2005    Past Surgical History:  Procedure Laterality Date  . ABDOMINAL HYSTERECTOMY    . APPENDECTOMY    . arthroscopic shoulder surgery Right 10/28/2016   decompression of SAD/DCR    There were no vitals filed for this visit.      Subjective Assessment - 01/06/17 1009    Subjective I'm sore this morning.   I see this doctor next week, Tuesday?   It's better than it was but it is a painful, long process.  I can use it to get out of the chair and commode.  I can sleep in the bed now instead of the recliner.  But I still can't drive.     Currently in Pain? Yes   Pain Score 3    Pain Location Shoulder   Pain Orientation Right   Pain Descriptors / Indicators Aching   Pain Type Surgical pain   Aggravating Factors  reach a high shelf (lowering hurts)   Pain Relieving Factors heat or ice                         [facility] Adult PT Treatment/Exercise - 01/06/17 0001      Elbow Exercises   Other elbow exercises yellow band elbow extensions 2x10     Shoulder Exercises: Supine   Protraction AROM;Right;20 reps   Other Supine Exercises bent arm lifts 10x   Other Supine Exercises 90 degree 12/6:00 10x;  9/3:00 10x each     Shoulder Exercises: Seated   Extension Strengthening;Right;20 reps;Theraband   Theraband Level (Shoulder Extension) Level 1 (Yellow)   ABduction Limitations attempted seated arm lifts with bent and straight elbow but discontinued secondary to increased pain   Other Seated Exercises 2# bicep curls 2x10     Shoulder Exercises: Sidelying   External Rotation Strengthening;Right;Weights;20 reps   External Rotation Weight (lbs) 1   Other Sidelying Exercises flexion to 80 degrees 10x   Other Sidelying Exercises scapula depression hold 5 sec 10 times     Shoulder Exercises: Standing   External Rotation Strengthening;Right;10 reps;Theraband   Theraband Level (Shoulder External Rotation) Level 1 (Yellow)   Internal Rotation Strengthening;Right;20 reps;Theraband   Theraband Level (Shoulder Internal Rotation) Level 1 (Yellow)   Flexion Limitations attempted yellow band press but discontinued secondary to pain   Extension Strengthening;Right;10 reps   Row Strengthening;Right;15 reps;Theraband   Theraband Level (Shoulder Row) Level 1 (Yellow)     Shoulder Exercises: ROM/Strengthening   Other ROM/Strengthening Exercises UE Ranger on floor, Level 1 and Level 5 10x each     Moist Heat Therapy   Number Minutes Moist Heat 10 Minutes   Moist Heat Location Shoulder  PT Short Term Goals - 01/06/17 1057      PT SHORT TERM GOAL #1   Title be independent in initial HEP   Status Achieved     PT SHORT TERM GOAL #2   Title be able to wear bra due to pain in right shoulder decreased >/= 25%   Status Achieved     PT SHORT TERM GOAL #3   Title be able to lift right arm to place deodarant under the left arm with minimal pain   Status Achieved     PT SHORT TERM GOAL #4   Title right shoulder strength </= 3-/5 to perfrom overhead activities inclucing reaching in cabinet and wash hair   Time 4   Period Weeks   Status Partially Met     PT SHORT TERM GOAL #5   Title sleep in  her bed due to right shoulder pain decreased >/= 25%   Status Achieved           PT Long Term Goals - 01/06/17 1058      PT LONG TERM GOAL #1   Title be independent in advanced HEP   Time 8   Period Weeks   Status On-going     PT LONG TERM GOAL #2   Title reduce FOTO to < or = to 36% limitation   Time 8   Period Weeks   Status On-going     PT LONG TERM GOAL #3   Title report < or = to 3/10 Rt shoulder pain with use for self-care and ADLs   Time 8   Period Weeks   Status On-going     PT LONG TERM GOAL #4   Title demonstrate Rt shoulder IR AROM to lacking < or = to 2 inches vs the Lt to improve self-care   Time 8   Period Weeks   Status On-going     PT LONG TERM GOAL #5   Title return to driving herself to therapy due to right shoulder AROM >/= 120 for flexion and abduction   Time 8   Period Weeks   Status On-going               Plan - 01/06/17 1051    Clinical Impression Statement The patient continues to complain of moderate and at times severe shoulder pain.  The patient needs exercise modifications secondary to increased pain response.  Pain especially with lowering her arm from an elevated position.   Good pain relief with moist heat.  Therapist closely monitoring response with all.     PT Next Visit Plan Continue with shoulder strength, scap stabs;  check ROM and progress toward goals;  recertification      Patient will benefit from skilled therapeutic intervention in order to improve the following deficits and impairments:     Visit Diagnosis: Chronic right shoulder pain  Muscle weakness (generalized)  Abnormal posture     Problem List Patient Active Problem List   Diagnosis Date Noted  . DVT (deep venous thrombosis) ([place]) 01/07/2013  . Pulmonary embolism ([place]) 01/05/2013  . GERD (gastroesophageal reflux disease) 01/05/2013   Ruben Im, PT 01/06/17 11:01 AM Phone: [phone] Fax: [phone]  Alvera Singh 01/06/2017, 11:00  AM  [facility] [address] [place], [place], [address] Phone: [phone]   Fax:  [phone]  Name: Stacy Valencia MRN: [id number] Date of Birth: September 09, 1933

## 2017-01-08 ENCOUNTER — Encounter: Payer: Medicare Other | Admitting: Physical Therapy

## 2017-01-14 DIAGNOSIS — Z4789 Encounter for other orthopedic aftercare: Secondary | ICD-10-CM | POA: Diagnosis not present

## 2017-01-14 DIAGNOSIS — M75111 Incomplete rotator cuff tear or rupture of right shoulder, not specified as traumatic: Secondary | ICD-10-CM | POA: Diagnosis not present

## 2017-01-15 ENCOUNTER — Ambulatory Visit: Payer: Medicare Other | Admitting: Physical Therapy

## 2017-01-15 DIAGNOSIS — M25511 Pain in right shoulder: Secondary | ICD-10-CM | POA: Diagnosis not present

## 2017-01-15 DIAGNOSIS — M6281 Muscle weakness (generalized): Secondary | ICD-10-CM | POA: Diagnosis not present

## 2017-01-15 DIAGNOSIS — R293 Abnormal posture: Secondary | ICD-10-CM | POA: Diagnosis not present

## 2017-01-15 DIAGNOSIS — G8929 Other chronic pain: Secondary | ICD-10-CM | POA: Diagnosis not present

## 2017-01-15 NOTE — Therapy (Signed)
[facility] [address] [place], [place], [address] Phone: [phone]   Fax:  [phone]  Physical Therapy Treatment/Recertification  Patient Details  Name: Stacy Valencia MRN: [id number] Date of Birth: 03/02/1933 Referring Provider: Dr. Gerrit Halls  Encounter Date: 01/15/2017      PT End of Session - 01/15/17 1658    Visit Number 16   Number of Visits 19   Date for PT Re-Evaluation 02/26/17   Authorization Type medicare g-code 19th visit   PT Start Time 1400   PT Stop Time 1445   PT Time Calculation (min) 45 min   Activity Tolerance Patient tolerated treatment well      Past Medical History:  Diagnosis Date  . GERD (gastroesophageal reflux disease)   . PE (pulmonary embolism) 2005    Past Surgical History:  Procedure Laterality Date  . ABDOMINAL HYSTERECTOMY    . APPENDECTOMY    . arthroscopic shoulder surgery Right 10/28/2016   decompression of SAD/DCR    There were no vitals filed for this visit.      Subjective Assessment - 01/15/17 1405    Subjective I saw the doctor and he thought I was doing well.  Hasn't returned to driving yet.  Haven't tried washing my hair yet.  No pain at rest.  3/10 at worst.  I can sleep on my side now.  Sleeping fine.     Currently in Pain? No/denies   Pain Score 0-No pain   Pain Location Shoulder   Pain Orientation Right   Pain Descriptors / Indicators Aching            [facility] PT Assessment - 01/15/17 0001      Observation/Other Assessments   Focus on Therapeutic Outcomes (FOTO)  41% limitation     AROM   Right Shoulder Flexion 120 Degrees  hurts coming down   Right Shoulder ABduction 124 Degrees   Right Shoulder Internal Rotation --  T10   Right Shoulder External Rotation 76 Degrees     Strength   Right Shoulder Flexion 3+/5   Right Shoulder Extension 4-/5   Right Shoulder ABduction 3+/5   Right Shoulder Internal Rotation 4-/5   Right Shoulder  External Rotation 4-/5                     [facility] Adult PT Treatment/Exercise - 01/15/17 0001      Shoulder Exercises: Supine   Other Supine Exercises on incline wedge bent arm lifts 15x 1# wt.     Other Supine Exercises 90 degree elevation with 1#  12/6:00 10x;  9/3:00 10x each     Shoulder Exercises: Seated   Other Seated Exercises ball on wall circles, up/down, side to side 10x each and alphabet      Shoulder Exercises: Standing   Extension Strengthening;Right;15 reps;Theraband   Theraband Level (Shoulder Extension) Level 1 (Yellow)   Other Standing Exercises yellow band diagonal extensions 15x both directions                  PT Short Term Goals - 01/15/17 1704      PT SHORT TERM GOAL #1   Title be independent in initial HEP   Status Achieved     PT SHORT TERM GOAL #2   Title be able to wear bra due to pain in right shoulder decreased >/= 25%   Status Achieved     PT SHORT TERM GOAL #3   Title be able  to lift right arm to place deodarant under the left arm with minimal pain   Status Achieved     PT SHORT TERM GOAL #4   Title right shoulder strength </= 3-/5 to perfrom overhead activities inclucing reaching in cabinet and wash hair   Status Achieved     PT SHORT TERM GOAL #5   Title sleep in her bed due to right shoulder pain decreased >/= 25%   Status Achieved           PT Long Term Goals - 01/15/17 1417      PT LONG TERM GOAL #1   Title be independent in advanced HEP   Time 8   Period Weeks   Status On-going     PT LONG TERM GOAL #2   Title reduce FOTO to < or = to 36% limitation   Time 8   Period Weeks   Status On-going     PT LONG TERM GOAL #3   Title report < or = to 3/10 Rt shoulder pain with use for self-care and ADLs   Status Achieved     PT LONG TERM GOAL #4   Title demonstrate Rt shoulder IR AROM to lacking < or = to 2 inches vs the Lt to improve self-care   Status Achieved     PT LONG TERM GOAL #5   Title  right  shoulder AROM >/= 140 for flexion needed for washing hair   Time 8   Period Weeks   Status Revised     Additional Long Term Goals   Additional Long Term Goals Yes     PT LONG TERM GOAL #6   Title Right shoulder strength grossly 4-/5 needed for driving   Time 8   Period Weeks   Status New               Plan - 01/15/17 1658    Clinical Impression Statement The patient has made good improvements in shoulder ROM and strength but she continues to be limited functionally.  She has not been able to wash her hair and is limited in her overhead UE use.  She also lacks to strength to return to driving.  Her pain intensity has decreased overall but she continues to have mild to moderate shoulder pain particularly with lowering her arm from an elevated position.   Good improvement with FOTO functional outcome score.   Progress has been slowed by multiple co-morbidities.  Recommend continued PT to address these deficits.     Rehab Potential Good   PT Frequency 2x / week   PT Duration 6 weeks   PT Treatment/Interventions ADLs/Self Care Home Management;Cryotherapy;Electrical Stimulation;Ultrasound;Moist Heat;Therapeutic activities;Therapeutic exercise;Neuromuscular re-education;Patient/family education;Passive range of motion;Manual techniques;Dry needling;Taping   PT Next Visit Plan Continue with shoulder strength, scap stabs;  shoulder elevation      Patient will benefit from skilled therapeutic intervention in order to improve the following deficits and impairments:  Decreased range of motion, Pain, Postural dysfunction, Decreased strength, Impaired flexibility, Decreased activity tolerance  Visit Diagnosis: Chronic right shoulder pain - Plan: PT plan of care cert/re-cert  Muscle weakness (generalized) - Plan: PT plan of care cert/re-cert     Problem List Patient Active Problem List   Diagnosis Date Noted  . DVT (deep venous thrombosis) ([place]) 01/07/2013  . Pulmonary embolism ([place])  01/05/2013  . GERD (gastroesophageal reflux disease) 01/05/2013   Ruben Im, PT 01/15/17 5:07 PM Phone: [phone] Fax: [phone]  Alvera Singh 01/15/2017, 5:06  PM  [facility] [address] [place], [place], [address] Phone: [phone]   Fax:  [phone]  Name: Stacy Valencia MRN: [id number] Date of Birth: [birth year]

## 2017-01-20 ENCOUNTER — Encounter: Payer: Self-pay | Admitting: Physical Therapy

## 2017-01-20 ENCOUNTER — Ambulatory Visit: Payer: Medicare Other | Admitting: Physical Therapy

## 2017-01-20 DIAGNOSIS — R293 Abnormal posture: Secondary | ICD-10-CM | POA: Diagnosis not present

## 2017-01-20 DIAGNOSIS — G8929 Other chronic pain: Secondary | ICD-10-CM

## 2017-01-20 DIAGNOSIS — M6281 Muscle weakness (generalized): Secondary | ICD-10-CM | POA: Diagnosis not present

## 2017-01-20 DIAGNOSIS — M25511 Pain in right shoulder: Secondary | ICD-10-CM

## 2017-01-20 NOTE — Therapy (Signed)
[facility] [address] [place], [place], [address] Phone: [phone]   Fax:  [phone]  Physical Therapy Treatment  Patient Details  Name: Stacy Valencia MRN: [id number] Date of Birth: [birth year] Referring Provider: Dr. Gerrit Halls  Encounter Date: 01/20/2017      PT End of Session - 01/20/17 1140    Visit Number 17   Number of Visits 19   Date for PT Re-Evaluation 02/26/17   Authorization Type medicare g-code 19th visit   PT Start Time 1139   PT Stop Time 1222   PT Time Calculation (min) 43 min   Activity Tolerance Patient tolerated treatment well   Behavior During Therapy [facility] for tasks assessed/performed      Past Medical History:  Diagnosis Date  . GERD (gastroesophageal reflux disease)   . PE (pulmonary embolism) 2005    Past Surgical History:  Procedure Laterality Date  . ABDOMINAL HYSTERECTOMY    . APPENDECTOMY    . arthroscopic shoulder surgery Right 10/28/2016   decompression of SAD/DCR    There were no vitals filed for this visit.      Subjective Assessment - 01/20/17 1139    Subjective Pt reports some soreness today.    Pertinent History s/P arthroscopic SAD/DCR on 10/28/2016   Limitations House hold activities;Lifting   Diagnostic tests x-ray: OA of Rt shoulder   Patient Stated Goals reduce shoulder pain and be able to use right arm   Currently in Pain? Yes   Pain Score 2    Pain Location Shoulder   Pain Orientation Right   Pain Descriptors / Indicators Aching   Pain Type Surgical pain   Pain Onset More than a month ago   Pain Frequency Intermittent   Aggravating Factors  reach high shelf (lowering hurts)   Pain Relieving Factors heat or ice   Effect of Pain on Daily Activities limted house hold activities   Multiple Pain Sites No                         [facility] Adult PT Treatment/Exercise - 01/20/17 0001      Shoulder Exercises: Supine   Other Supine  Exercises on incline wedge bent arm lifts 15x 1# wt.     Other Supine Exercises 90 degree elevation with 1#  12/6:00 10x;  9/3:00 10x each     Shoulder Exercises: Seated   Other Seated Exercises ball on wall circles, up/down, side to side 10x each and alphabet      Shoulder Exercises: Standing   Extension Strengthening;Right;15 reps;Theraband   Theraband Level (Shoulder Extension) Level 2 (Red)   Other Standing Exercises yellow band diagonal extensions 15x both directions     Shoulder Exercises: Pulleys   Flexion 2 minutes   ABduction 2 minutes                  PT Short Term Goals - 01/20/17 1152      PT SHORT TERM GOAL #1   Title be independent in initial HEP   Time 4   Period Weeks   Status Achieved           PT Long Term Goals - 01/20/17 1152      PT LONG TERM GOAL #1   Title be independent in advanced HEP   Time 8   Period Weeks   Status On-going     PT LONG TERM GOAL #2   Title reduce FOTO to <  or = to 36% limitation   Time 8   Period Weeks   Status On-going     PT LONG TERM GOAL #3   Title report < or = to 3/10 Rt shoulder pain with use for self-care and ADLs   Time 8   Period Weeks   Status Achieved     PT LONG TERM GOAL #5   Title  right shoulder AROM >/= 140 for flexion needed for washing hair   Time 8   Period Weeks   Status On-going     PT LONG TERM GOAL #6   Title Right shoulder strength grossly 4-/5 needed for driving   Time 8   Period Weeks   Status On-going               Plan - 01/20/17 1231    Clinical Impression Statement Pt contines to be limied with shoulder A/ROM especially over head. Pt able to tolerate all exercises well with some muscle fatigue but able to demonstrate good control with shoudler movements. Pt will continue to benefit from skilled therapy for shoulder strength and stability.    Rehab Potential Good   Clinical Impairments Affecting Rehab Potential None   PT Frequency 2x / week   PT Duration 6  weeks   PT Treatment/Interventions ADLs/Self Care Home Management;Cryotherapy;Electrical Stimulation;Ultrasound;Moist Heat;Therapeutic activities;Therapeutic exercise;Neuromuscular re-education;Patient/family education;Passive range of motion;Manual techniques;Dry needling;Taping   PT Next Visit Plan Continue with shoulder strength, scap stabs;  shoulder elevation   Consulted and Agree with Plan of Care Patient      Patient will benefit from skilled therapeutic intervention in order to improve the following deficits and impairments:  Decreased range of motion, Pain, Postural dysfunction, Decreased strength, Impaired flexibility, Decreased activity tolerance  Visit Diagnosis: Chronic right shoulder pain  Muscle weakness (generalized)  Abnormal posture  Acute pain of right shoulder     Problem List Patient Active Problem List   Diagnosis Date Noted  . DVT (deep venous thrombosis) ([place]) 01/07/2013  . Pulmonary embolism ([place]) 01/05/2013  . GERD (gastroesophageal reflux disease) 01/05/2013    Stacy Valencia PTA 01/20/2017, 12:33 PM  [facility] [address], [place], [place], [address] Phone: [phone]   Fax:  [phone]  Name: Stacy Valencia MRN: [id number] Date of Birth: 21-Jul-1933

## 2017-01-22 ENCOUNTER — Ambulatory Visit: Payer: Medicare Other | Attending: Physician Assistant | Admitting: Physical Therapy

## 2017-01-22 DIAGNOSIS — M25511 Pain in right shoulder: Secondary | ICD-10-CM | POA: Insufficient documentation

## 2017-01-22 DIAGNOSIS — M6281 Muscle weakness (generalized): Secondary | ICD-10-CM | POA: Diagnosis not present

## 2017-01-22 DIAGNOSIS — R293 Abnormal posture: Secondary | ICD-10-CM | POA: Insufficient documentation

## 2017-01-22 DIAGNOSIS — G8929 Other chronic pain: Secondary | ICD-10-CM | POA: Diagnosis not present

## 2017-01-22 NOTE — Therapy (Signed)
[facility] [address] [place], [place], [address] Phone: [phone]   Fax:  [phone]  Physical Therapy Treatment  Patient Details  Name: Stacy Valencia MRN: [id number] Date of Birth: [birth year] Referring Provider: Dr. Gerrit Halls  Encounter Date: 01/22/2017      PT End of Session - 01/22/17 1047    Visit Number 18   Number of Visits 28   Date for PT Re-Evaluation 02/26/17   Authorization Type medicare g-code 28th visit   PT Start Time 1013   PT Stop Time 1101   PT Time Calculation (min) 48 min   Activity Tolerance Patient tolerated treatment well      Past Medical History:  Diagnosis Date  . GERD (gastroesophageal reflux disease)   . PE (pulmonary embolism) 2005    Past Surgical History:  Procedure Laterality Date  . ABDOMINAL HYSTERECTOMY    . APPENDECTOMY    . arthroscopic shoulder surgery Right 10/28/2016   decompression of SAD/DCR    There were no vitals filed for this visit.      Subjective Assessment - 01/22/17 1016    Subjective I've been using my left hand to wash my hair.     Currently in Pain? Yes   Pain Score 2    Pain Location Shoulder   Pain Orientation Right   Pain Descriptors / Indicators Aching;Sore   Pain Type Chronic pain   Aggravating Factors  reach high shelf lowering it hurts   Pain Relieving Factors heat or ice            [facility] PT Assessment - 01/22/17 0001      Observation/Other Assessments   Focus on Therapeutic Outcomes (FOTO)  41% limitation     AROM   Right Shoulder Flexion 134 Degrees   Right Shoulder ABduction 110 Degrees   Right Shoulder Internal Rotation --  behind back to T 8   Right Shoulder External Rotation 76 Degrees     Strength   Right Shoulder Flexion 3+/5   Right Shoulder Extension 4-/5   Right Shoulder ABduction 3+/5   Right Shoulder Internal Rotation 4-/5   Right Shoulder External Rotation 4-/5                      [facility] Adult PT Treatment/Exercise - 01/22/17 0001      Shoulder Exercises: Supine   Flexion Strengthening;Right;20 reps;Weights   Shoulder Flexion Weight (lbs) 2   Other Supine Exercises on incline wedge bent arm lifts 15x 2# wt.     Other Supine Exercises 90 degree elevation with 2#  12/6:00 15x;  9/3:00 10x each     Shoulder Exercises: Seated   Extension --  tricep extensions red band 20x   Row Strengthening;Right;15 reps;Theraband   Other Seated Exercises 3# elbow curls  20x     Shoulder Exercises: Standing   Extension Strengthening;Right;15 reps;Theraband   Theraband Level (Shoulder Extension) Level 2 (Red)   Other Standing Exercises red band diagonal extensions 15x both directions   Other Standing Exercises red physioball roll on wall 12x                  PT Short Term Goals - 01/22/17 1108      PT SHORT TERM GOAL #1   Title be independent in initial HEP   Status Achieved     PT SHORT TERM GOAL #2   Title be able to wear bra due to pain in right shoulder  decreased >/= 25%   Status Achieved     PT SHORT TERM GOAL #3   Title be able to lift right arm to place deodarant under the left arm with minimal pain   Status Achieved     PT SHORT TERM GOAL #4   Title right shoulder strength </= 3-/5 to perfrom overhead activities inclucing reaching in cabinet and wash hair   Status Achieved     PT SHORT TERM GOAL #5   Title sleep in her bed due to right shoulder pain decreased >/= 25%   Status Achieved           PT Long Term Goals - February 19, 2017 1108      PT LONG TERM GOAL #1   Title be independent in advanced HEP   Time 8   Period Weeks   Status On-going     PT LONG TERM GOAL #2   Title reduce FOTO to < or = to 36% limitation   Time 8   Period Weeks   Status On-going     PT LONG TERM GOAL #3   Title report < or = to 3/10 Rt shoulder pain with use for self-care and ADLs   Status Achieved     PT LONG TERM GOAL #4   Title demonstrate Rt shoulder IR  AROM to lacking < or = to 2 inches vs the Lt to improve self-care   Status Achieved     PT LONG TERM GOAL #5   Title  right shoulder AROM >/= 140 for flexion needed for washing hair   Time 8   Period Weeks   Status On-going     PT LONG TERM GOAL #6   Title Right shoulder strength grossly 4-/5 needed for driving   Time 8   Period Weeks   Status On-going               Plan - 02/19/17 1055    Clinical Impression Statement The patient demonstrates improved shoulder flexion ROM and decreased pain with lowering her arm.  She declines the need for modalities today.  She does experience UE  muscular fatigue especially with ROM above 90 degrees after 10-15 repetitions, requiring frequent rest breaks.   Also needs modifications (to sit more than stand secondary to LBP).  Therapist closely monitoring response with all treatment interventions.     PT Next Visit Plan Continue with shoulder strength, scap stabs;  shoulder elevation;   G codes applied today (early)      Patient will benefit from skilled therapeutic intervention in order to improve the following deficits and impairments:     Visit Diagnosis: Chronic right shoulder pain  Muscle weakness (generalized)  Abnormal posture       G-Codes - 2017-02-19 1110    Functional Assessment Tool Used FOTO   Functional Limitation Other PT primary   Other PT Primary Current Status [id number]) At least 40 percent but less than 60 percent impaired, limited or restricted   Other PT Primary Goal Status [id number]) At least 20 percent but less than 40 percent impaired, limited or restricted      Problem List Patient Active Problem List   Diagnosis Date Noted  . DVT (deep venous thrombosis) ([place]) 01/07/2013  . Pulmonary embolism ([place]) 01/05/2013  . GERD (gastroesophageal reflux disease) 01/05/2013    Ruben Im, PT 02-19-17 11:12 AM Phone: [phone] Fax: [phone]  Alvera Singh 02-19-2017, 11:12 AM  [facility] [address]  [place], [place], [place], [address] Phone: [phone]   Fax:  [phone]  Name: Juliessa Sullender MRN: [id number] Date of Birth: 07-14-1933

## 2017-01-27 ENCOUNTER — Ambulatory Visit: Payer: Medicare Other | Admitting: Physical Therapy

## 2017-01-27 ENCOUNTER — Encounter: Payer: Self-pay | Admitting: Physical Therapy

## 2017-01-27 DIAGNOSIS — R293 Abnormal posture: Secondary | ICD-10-CM | POA: Diagnosis not present

## 2017-01-27 DIAGNOSIS — M25511 Pain in right shoulder: Secondary | ICD-10-CM

## 2017-01-27 DIAGNOSIS — M6281 Muscle weakness (generalized): Secondary | ICD-10-CM

## 2017-01-27 DIAGNOSIS — G8929 Other chronic pain: Secondary | ICD-10-CM | POA: Diagnosis not present

## 2017-01-27 NOTE — Therapy (Signed)
[facility] [address], [place], [place], [address] Phone: [phone]   Fax:  [phone]  Physical Therapy Treatment  Patient Details  Name: Stacy Valencia MRN: [id number] Date of Birth: 09-21-1933 Referring Provider: Dr. Gerrit Halls  Encounter Date: 01/27/2017      PT End of Session - 01/27/17 1059    Visit Number 19   Number of Visits 28   Date for PT Re-Evaluation 02/26/17   Authorization Type medicare g-code 28th visit   PT Start Time 1056   PT Stop Time 1137   PT Time Calculation (min) 41 min   Activity Tolerance Patient tolerated treatment well   Behavior During Therapy [facility] for tasks assessed/performed      Past Medical History:  Diagnosis Date  . GERD (gastroesophageal reflux disease)   . PE (pulmonary embolism) 2005    Past Surgical History:  Procedure Laterality Date  . ABDOMINAL HYSTERECTOMY    . APPENDECTOMY    . arthroscopic shoulder surgery Right 10/28/2016   decompression of SAD/DCR    There were no vitals filed for this visit.      Subjective Assessment - 01/27/17 1058    Subjective Shoulder feeling ok, feels good when I don't use it. It's loosening up right now with movement.    Pertinent History s/P arthroscopic SAD/DCR on 10/28/2016   Limitations House hold activities;Lifting   Diagnostic tests x-ray: OA of Rt shoulder   Patient Stated Goals reduce shoulder pain and be able to use right arm   Currently in Pain? Yes   Pain Score 2    Pain Location Shoulder   Pain Orientation Right   Pain Descriptors / Indicators Aching;Sore   Pain Type Chronic pain   Pain Onset More than a month ago   Pain Frequency Intermittent                         [facility] Adult PT Treatment/Exercise - 01/27/17 0001      Shoulder Exercises: Supine   Horizontal ABduction --   Theraband Level (Shoulder Horizontal ABduction) --   External Rotation --   Theraband Level (Shoulder External  Rotation) --   Flexion Strengthening;Right;20 reps;Weights   Shoulder Flexion Weight (lbs) 2   Other Supine Exercises on incline wedge bent arm lifts 3x10 3# wt.     Other Supine Exercises 90 degree elevation with 3#  12/6:00 15x;  9/3:00 10x each     Shoulder Exercises: Seated   Extension --  tricep extensions red band 20x   Other Seated Exercises 3# elbow curls  20x     Shoulder Exercises: Standing   Flexion --  [facility] stacking into cabinet 1st and 2nd shelf x1 each   Extension Strengthening;Right;15 reps;Theraband   Theraband Level (Shoulder Extension) Level 3 (Green)   Row Strengthening;Right;15 reps;Theraband   Theraband Level (Shoulder Row) Level 3 (Green)   Other Standing Exercises red band diagonal extensions 15x both directions   Other Standing Exercises red physioball roll on wall 12x     Shoulder Exercises: Pulleys   Flexion 2 minutes   ABduction 2 minutes                  PT Short Term Goals - 01/22/17 1108      PT SHORT TERM GOAL #1   Title be independent in initial HEP   Status Achieved     PT SHORT TERM GOAL #2   Title be able  to wear bra due to pain in right shoulder decreased >/= 25%   Status Achieved     PT SHORT TERM GOAL #3   Title be able to lift right arm to place deodarant under the left arm with minimal pain   Status Achieved     PT SHORT TERM GOAL #4   Title right shoulder strength </= 3-/5 to perfrom overhead activities inclucing reaching in cabinet and wash hair   Status Achieved     PT SHORT TERM GOAL #5   Title sleep in her bed due to right shoulder pain decreased >/= 25%   Status Achieved           PT Long Term Goals - 01/27/17 1059      PT LONG TERM GOAL #2   Title reduce FOTO to < or = to 36% limitation   Time 8   Period Weeks   Status On-going     PT LONG TERM GOAL #3   Title report < or = to 3/10 Rt shoulder pain with use for self-care and ADLs   Time 8   Period Weeks   Status Achieved     PT LONG TERM GOAL  #5   Title  right shoulder AROM >/= 140 for flexion needed for washing hair   Time 8   Period Weeks   Status On-going     PT LONG TERM GOAL #6   Title Right shoulder strength grossly 4-/5 needed for driving   Time 8   Period Weeks   Status On-going               Plan - 01/27/17 1126    Clinical Impression Statement Pt continues to progress with shoudler strength and muscular endurance. Able to tolerate all exercises well with some shoulder fatigue. Able to reach into cabinet for cone stacking on first and second shelf. Has significant fatigue by second set. Able to put one cone on and off 3rd shefl then too tired to continue. Pt will continue to benefit from skilled thearpy for shoulder strength and muscle endurance.    Rehab Potential Good   Clinical Impairments Affecting Rehab Potential None   PT Frequency 2x / week   PT Duration 6 weeks   PT Treatment/Interventions ADLs/Self Care Home Management;Cryotherapy;Electrical Stimulation;Ultrasound;Moist Heat;Therapeutic activities;Therapeutic exercise;Neuromuscular re-education;Patient/family education;Passive range of motion;Manual techniques;Dry needling;Taping   PT Next Visit Plan Shoulder stretngth, theraputic activities with OH use   Consulted and Agree with Plan of Care Patient      Patient will benefit from skilled therapeutic intervention in order to improve the following deficits and impairments:  Decreased range of motion, Pain, Postural dysfunction, Decreased strength, Impaired flexibility, Decreased activity tolerance  Visit Diagnosis: Chronic right shoulder pain  Muscle weakness (generalized)  Abnormal posture  Acute pain of right shoulder     Problem List Patient Active Problem List   Diagnosis Date Noted  . DVT (deep venous thrombosis) ([place]) 01/07/2013  . Pulmonary embolism ([place]) 01/05/2013  . GERD (gastroesophageal reflux disease) 01/05/2013    [name] 01/27/2017, 12:01 PM  [facility] [address], [place], [place], [address] Phone: [phone]   Fax:  [phone]  Name: Stacy Valencia MRN: [id number] Date of Birth: 02-24-1933

## 2017-01-29 ENCOUNTER — Ambulatory Visit: Payer: Medicare Other | Admitting: Physical Therapy

## 2017-01-29 DIAGNOSIS — M6281 Muscle weakness (generalized): Secondary | ICD-10-CM

## 2017-01-29 DIAGNOSIS — M25511 Pain in right shoulder: Principal | ICD-10-CM

## 2017-01-29 DIAGNOSIS — G8929 Other chronic pain: Secondary | ICD-10-CM

## 2017-01-29 DIAGNOSIS — R293 Abnormal posture: Secondary | ICD-10-CM | POA: Diagnosis not present

## 2017-01-29 NOTE — Therapy (Signed)
[facility] [address], [place], [place], [address] Phone: [phone]   Fax:  [phone]  Physical Therapy Treatment  Patient Details  Name: Stacy Valencia MRN: [id number] Date of Birth: July 16, 1933 Referring Provider: Dr. Gerrit Halls  Encounter Date: 01/29/2017      PT End of Session - 01/29/17 1046    Visit Number 20   Number of Visits 28   Date for PT Re-Evaluation 02/26/17   Authorization Type medicare g-code 28th visit   PT Start Time [id number]   PT Stop Time 1055   PT Time Calculation (min) 40 min   Activity Tolerance Patient tolerated treatment well      Past Medical History:  Diagnosis Date  . GERD (gastroesophageal reflux disease)   . PE (pulmonary embolism) 2005    Past Surgical History:  Procedure Laterality Date  . ABDOMINAL HYSTERECTOMY    . APPENDECTOMY    . arthroscopic shoulder surgery Right 10/28/2016   decompression of SAD/DCR    There were no vitals filed for this visit.      Subjective Assessment - 01/29/17 1021    Subjective My shoulder is achy today.  It's always more sore after therapy.  I'm using it a whole lot more now.  I can lie on it now.  I want to start driving but my husband won't let me yet.     Currently in Pain? Yes   Pain Score 2    Pain Location Shoulder   Pain Orientation Right   Pain Descriptors / Indicators Aching   Pain Type Chronic pain   Aggravating Factors  lowering arm after reaching on a higher shelf.     Pain Relieving Factors not using it                         [facility] Adult PT Treatment/Exercise - 01/29/17 0001      Shoulder Exercises: Supine   Flexion Strengthening;Right;20 reps;Weights   Shoulder Flexion Weight (lbs) 2   Other Supine Exercises 90 degree elevation with 3#  12/6:00 15x;  9/3:00 10x each     Shoulder Exercises: Seated   Other Seated Exercises 3# elbow curls  25x     Shoulder Exercises: Standing   Extension  Strengthening;Right;15 reps;Theraband   Theraband Level (Shoulder Extension) Level 3 (Green)   Row Strengthening;Right;15 reps;Theraband   Theraband Level (Shoulder Row) Level 3 (Green)   Shoulder Elevation Limitations red ball roll on railing 20x   Other Standing Exercises red band diagonal extensions 15x both directions   Other Standing Exercises beach ball up/down and side to side 10x each above shoulder level     Shoulder Exercises: Pulleys   Flexion 2 minutes                  PT Short Term Goals - 01/29/17 1417      PT SHORT TERM GOAL #1   Title be independent in initial HEP   Status Achieved     PT SHORT TERM GOAL #2   Title be able to wear bra due to pain in right shoulder decreased >/= 25%   Status Achieved     PT SHORT TERM GOAL #3   Title be able to lift right arm to place deodarant under the left arm with minimal pain   Status Achieved     PT SHORT TERM GOAL #4   Title right shoulder strength </= 3-/5 to perfrom overhead activities  inclucing reaching in cabinet and wash hair   Status Achieved     PT SHORT TERM GOAL #5   Title sleep in her bed due to right shoulder pain decreased >/= 25%   Status Achieved           PT Long Term Goals - 01/29/17 1417      PT LONG TERM GOAL #1   Title be independent in advanced HEP   Time 8   Period Weeks   Status On-going     PT LONG TERM GOAL #2   Title reduce FOTO to < or = to 36% limitation   Time 8   Period Weeks   Status On-going     PT LONG TERM GOAL #3   Title report < or = to 3/10 Rt shoulder pain with use for self-care and ADLs   Status Achieved     PT LONG TERM GOAL #4   Title demonstrate Rt shoulder IR AROM to lacking < or = to 2 inches vs the Lt to improve self-care   Status Achieved     PT LONG TERM GOAL #5   Title  right shoulder AROM >/= 140 for flexion needed for washing hair   Period Weeks   Status On-going     PT LONG TERM GOAL #6   Title Right shoulder strength grossly 4-/5  needed for driving   Time 8   Period Weeks   Status On-going               Plan - 01/29/17 1046    Clinical Impression Statement The patient reports functional improvements in use of her shoulder and improving overall pain.  She has pain in lowering her arm from an elevated position although this has been improving over the last 2 weeks.  She continues to have muscular fatigue rather quickly.   Therapist closely monitoring response and modifying as needed in response to discomfort and fatigue.     PT Next Visit Plan Shoulder elevation ROM;  functional strengthening      Patient will benefit from skilled therapeutic intervention in order to improve the following deficits and impairments:     Visit Diagnosis: Chronic right shoulder pain  Muscle weakness (generalized)     Problem List Patient Active Problem List   Diagnosis Date Noted  . DVT (deep venous thrombosis) ([place]) 01/07/2013  . Pulmonary embolism ([place]) 01/05/2013  . GERD (gastroesophageal reflux disease) 01/05/2013   Ruben Im, PT 01/29/17 2:18 PM Phone: [phone] Fax: [phone]  Alvera Singh 01/29/2017, 2:18 PM  [facility] [address] [place], [place], [address] Phone: [phone]   Fax:  [phone]  Name: Stacy Valencia MRN: [id number] Date of Birth: 04/06/1933

## 2017-02-03 ENCOUNTER — Ambulatory Visit: Payer: Medicare Other | Admitting: Physical Therapy

## 2017-02-03 DIAGNOSIS — M25511 Pain in right shoulder: Secondary | ICD-10-CM | POA: Diagnosis not present

## 2017-02-03 DIAGNOSIS — G8929 Other chronic pain: Secondary | ICD-10-CM

## 2017-02-03 DIAGNOSIS — M6281 Muscle weakness (generalized): Secondary | ICD-10-CM

## 2017-02-03 DIAGNOSIS — R293 Abnormal posture: Secondary | ICD-10-CM

## 2017-02-03 NOTE — Therapy (Signed)
[facility] [address], [place], [place], [address] Phone: [phone]   Fax:  [phone]  Physical Therapy Treatment  Patient Details  Name: Stacy Valencia MRN: [id number] Date of Birth: 11-02-1933 Referring Provider: Dr. Gerrit Halls  Encounter Date: 02/03/2017      PT End of Session - 02/03/17 1021    Visit Number 21   Number of Visits 28   Date for PT Re-Evaluation 02/26/17   Authorization Type medicare g-code 28th visit   PT Start Time [id number]   PT Stop Time 1057   PT Time Calculation (min) 42 min   Activity Tolerance Patient tolerated treatment well      Past Medical History:  Diagnosis Date  . GERD (gastroesophageal reflux disease)   . PE (pulmonary embolism) 2005    Past Surgical History:  Procedure Laterality Date  . ABDOMINAL HYSTERECTOMY    . APPENDECTOMY    . arthroscopic shoulder surgery Right 10/28/2016   decompression of SAD/DCR    There were no vitals filed for this visit.      Subjective Assessment - 02/03/17 1017    Subjective I know this therapy is helping b/c I can use it a whole lot more.  When I first started therapy I couldn't sleep in the bed.  Not as bad with lowering arm down from an elevated position.     Currently in Pain? Yes   Pain Score 2    Pain Location Shoulder   Pain Orientation Right   Pain Descriptors / Indicators Aching   Pain Type Chronic pain   Pain Frequency Intermittent   Aggravating Factors  lowering arm from higher shelf   Pain Relieving Factors when I don't use it                         [facility] Adult PT Treatment/Exercise - 02/03/17 0001      Therapeutic Activites    ADL's reaching, carrying and driving simulation activities     Shoulder Exercises: Supine   Flexion Strengthening;Right;20 reps;Weights   Shoulder Flexion Weight (lbs) 2   Other Supine Exercises 90 degree elevation with 3#  12/6:00 15x;  9/3:00 10x each     Shoulder  Exercises: Seated   Other Seated Exercises 4# elbow curls  30x     Shoulder Exercises: Standing   Extension Strengthening;Right;15 reps;Theraband   Theraband Level (Shoulder Extension) Level 3 (Green)   Shoulder Elevation Limitations red ball roll on railing 20x   Other Standing Exercises green band diagonal extensions 15x both directions     Shoulder Exercises: Pulleys   Flexion 2 minutes     Shoulder Exercises: ROM/Strengthening   Other ROM/Strengthening Exercises Nu-Step UEs and LEs 5 min                  PT Short Term Goals - 02/03/17 1106      PT SHORT TERM GOAL #1   Title be independent in initial HEP   Status Achieved     PT SHORT TERM GOAL #2   Title be able to wear bra due to pain in right shoulder decreased >/= 25%   Status Achieved     PT SHORT TERM GOAL #3   Title be able to lift right arm to place deodarant under the left arm with minimal pain   Status Achieved     PT SHORT TERM GOAL #4   Title right shoulder strength </= 3-/5 to  perfrom overhead activities inclucing reaching in cabinet and wash hair   Status Achieved     PT SHORT TERM GOAL #5   Title sleep in her bed due to right shoulder pain decreased >/= 25%   Status Achieved           PT Long Term Goals - 02/03/17 1106      PT LONG TERM GOAL #1   Title be independent in advanced HEP   Time 8   Period Weeks   Status On-going     PT LONG TERM GOAL #2   Title reduce FOTO to < or = to 36% limitation   Time 8   Period Weeks   Status On-going     PT LONG TERM GOAL #3   Title report < or = to 3/10 Rt shoulder pain with use for self-care and ADLs   Status Achieved     PT LONG TERM GOAL #4   Title demonstrate Rt shoulder IR AROM to lacking < or = to 2 inches vs the Lt to improve self-care   Status Achieved     PT LONG TERM GOAL #5   Title  right shoulder AROM >/= 140 for flexion needed for washing hair   Time 8   Status On-going     PT LONG TERM GOAL #6   Title Right shoulder  strength grossly 4-/5 needed for driving   Period Weeks   Status On-going               Plan - 02/03/17 1051    Clinical Impression Statement The patient continues to make functional improvements with spontaneous use of right upper extremity.  Less complaint of pain overall, but has muscular fatigue with higher repetitions.  Patient participating in therapeutic activities for reaching, carrying and  to prepare for return to driving.  Therapist closely monitoring response with all interventions.     PT Next Visit Plan recheck shoulder ROM and progress toward goals;  therapeutic activities      Patient will benefit from skilled therapeutic intervention in order to improve the following deficits and impairments:     Visit Diagnosis: Chronic right shoulder pain  Muscle weakness (generalized)  Abnormal posture     Problem List Patient Active Problem List   Diagnosis Date Noted  . DVT (deep venous thrombosis) ([place]) 01/07/2013  . Pulmonary embolism ([place]) 01/05/2013  . GERD (gastroesophageal reflux disease) 01/05/2013   Ruben Im, PT 02/03/17 11:08 AM Phone: [phone] Fax: [phone]  Alvera Singh 02/03/2017, 11:08 AM  [place] [address] [place], [place], [address] Phone: [phone]   Fax:  [phone]  Name: Adalynne Fekete MRN: [id number] Date of Birth: [birth year]

## 2017-02-05 ENCOUNTER — Ambulatory Visit: Payer: Medicare Other | Admitting: Physical Therapy

## 2017-02-05 DIAGNOSIS — M6281 Muscle weakness (generalized): Secondary | ICD-10-CM

## 2017-02-05 DIAGNOSIS — M25511 Pain in right shoulder: Secondary | ICD-10-CM | POA: Diagnosis not present

## 2017-02-05 DIAGNOSIS — G8929 Other chronic pain: Secondary | ICD-10-CM | POA: Diagnosis not present

## 2017-02-05 DIAGNOSIS — R293 Abnormal posture: Secondary | ICD-10-CM

## 2017-02-05 NOTE — Therapy (Signed)
[facility] [address], [place], [place], [address] Phone: [phone]   Fax:  [phone]  Physical Therapy Treatment/Discharge Summary  Patient Details  Name: Stacy Valencia MRN: [id number] Date of Birth: [birth year] Referring Provider: Dr. Gerrit Halls  Encounter Date: 02/05/2017      PT End of Session - 02/05/17 1209    Visit Number 22   Number of Visits 28   Date for PT Re-Evaluation 02/26/17   Authorization Type medicare g-code 28th visit   PT Start Time 0354   PT Stop Time 1058   PT Time Calculation (min) 43 min   Activity Tolerance Patient tolerated treatment well      Past Medical History:  Diagnosis Date  . GERD (gastroesophageal reflux disease)   . PE (pulmonary embolism) 2005    Past Surgical History:  Procedure Laterality Date  . ABDOMINAL HYSTERECTOMY    . APPENDECTOMY    . arthroscopic shoulder surgery Right 10/28/2016   decompression of SAD/DCR    There were no vitals filed for this visit.      Subjective Assessment - 02/05/17 1020    Subjective Medicare said they are not paying for anymore.  I've improved so much.  They only activity I haven't gone back to doing is driving.  I would have a hard time lifting a heavy casserole.     Currently in Pain? Yes   Pain Score 2    Pain Location Shoulder   Pain Orientation Right            [facility] PT Assessment - 02/05/17 0001      Observation/Other Assessments   Focus on Therapeutic Outcomes (FOTO)  41% limitation     AROM   Right Shoulder Flexion 148 Degrees   Right Shoulder ABduction 140 Degrees   Right Shoulder Internal Rotation --  T 10   Right Shoulder External Rotation 78 Degrees     Strength   Right Shoulder Flexion 4-/5   Right Shoulder Extension 4-/5   Right Shoulder ABduction 3+/5   Right Shoulder Internal Rotation 4-/5   Right Shoulder External Rotation 4-/5                     [facility] Adult PT  Treatment/Exercise - 02/05/17 0001      Therapeutic Activites    ADL's reaching, carrying and driving simulation activities     Shoulder Exercises: Supine   Flexion Strengthening;Right;20 reps;Weights   Shoulder Flexion Weight (lbs) 2   Other Supine Exercises 90 degree elevation with 3#  12/6:00 15x;  9/3:00 10x each     Shoulder Exercises: Seated   Other Seated Exercises 4# elbow curls  30x     Shoulder Exercises: Sidelying   External Rotation Strengthening;Right;10 reps   External Rotation Weight (lbs) 3     Shoulder Exercises: Pulleys   Flexion 2 minutes                  PT Short Term Goals - 02/05/17 1039      PT SHORT TERM GOAL #1   Title be independent in initial HEP   Status Achieved     PT SHORT TERM GOAL #2   Title be able to wear bra due to pain in right shoulder decreased >/= 25%   Status Achieved     PT SHORT TERM GOAL #3   Title be able to lift right arm to place deodarant under the left arm with minimal pain  PT SHORT TERM GOAL #4   Title right shoulder strength </= 3-/5 to perfrom overhead activities inclucing reaching in cabinet and wash hair   Status Achieved     PT SHORT TERM GOAL #5   Title sleep in her bed due to right shoulder pain decreased >/= 25%   Status Achieved           PT Long Term Goals - Mar 03, 2017 1021      PT LONG TERM GOAL #1   Title be independent in advanced HEP   Status Achieved     PT LONG TERM GOAL #2   Title reduce FOTO to < or = to 36% limitation   Status Partially Met     PT LONG TERM GOAL #3   Title report < or = to 3/10 Rt shoulder pain with use for self-care and ADLs   Status Achieved     PT LONG TERM GOAL #4   Title demonstrate Rt shoulder IR AROM to lacking < or = to 2 inches vs the Lt to improve self-care   Status Achieved     PT LONG TERM GOAL #5   Title  right shoulder AROM >/= 140 for flexion needed for washing hair   Status Achieved     PT LONG TERM GOAL #6   Title Right shoulder  strength grossly 4-/5 needed for driving   Status Partially Met               Plan - 2017/03/03 1040    Clinical Impression Statement Overall 80% better.  Her shoulder ROM has significantly improved in all directions.  Functionally she is able to use her right UE spontaneously for ADLs although some difficulty lifting a casserole dish.  She has not driven yet.    Strength grossly 4-/5 except abduction 3+/5.  Majority of rehab goals met.        Patient will benefit from skilled therapeutic intervention in order to improve the following deficits and impairments:     Visit Diagnosis: Chronic right shoulder pain  Muscle weakness (generalized)  Abnormal posture       G-Codes - 03/03/17 1052    Functional Assessment Tool Used FOTO   Functional Limitation Other PT primary   Other PT Primary Goal Status (D6438) At least 20 percent but less than 40 percent impaired, limited or restricted   Other PT Primary Discharge Status (V8184) At least 40 percent but less than 60 percent impaired, limited or restricted      Problem List Patient Active Problem List   Diagnosis Date Noted  . DVT (deep venous thrombosis) ([place]) 01/07/2013  . Pulmonary embolism ([place]) 01/05/2013  . GERD (gastroesophageal reflux disease) 01/05/2013     PHYSICAL THERAPY DISCHARGE SUMMARY  Visits from Start of Care: 22  Current functional level related to goals / functional outcomes: See clinical impressions above.  Majority of rehab goals met.   Remaining deficits: As above   Education / Equipment: Comprehensive HEP for further improvements in ROM and strength Plan: Patient agrees to discharge.  Patient goals were met. Patient is being discharged due to meeting the stated rehab goals.  ?????     Alvera Singh 03/03/2017, 12:09 PM  [facility] [address], [place], [place], [address] Phone: [phone]   Fax:  [phone]  Name:  Latiya Navia MRN: [id number] Date of Birth: Sep 30, 1933

## 2017-02-05 NOTE — Therapy (Signed)
[name] [address], [address] [place], [place], [address] Phone: [phone]   Fax:  [phone]  Physical Therapy Treatment  Patient Details  Name: Stacy Valencia MRN: [id number] Date of Birth: 11/09/1933 Referring Provider: Dr. Jodene Nam  Encounter Date: 02/05/2017    Past Medical History:  Diagnosis Date  . GERD (gastroesophageal reflux disease)   . PE (pulmonary embolism) 2005    Past Surgical History:  Procedure Laterality Date  . ABDOMINAL HYSTERECTOMY    . APPENDECTOMY    . arthroscopic shoulder surgery Right 10/28/2016   decompression of SAD/DCR    There were no vitals filed for this visit.      Subjective Assessment - 02/05/17 1020    Subjective Medicare said they are not paying for anymore.  I've improved so much.  They only activity I haven't gone back to doing is driving.  I would have a hard time lifting a heavy casserole.     Currently in Pain? Yes   Pain Score 2    Pain Location Shoulder   Pain Orientation Right            [facility] PT Assessment - 02/05/17 0001      Observation/Other Assessments   Focus on Therapeutic Outcomes (FOTO)  41% limitation     AROM   Right Shoulder Flexion 148 Degrees   Right Shoulder ABduction 140 Degrees   Right Shoulder Internal Rotation --  T 10   Right Shoulder External Rotation 78 Degrees     Strength   Right Shoulder Flexion 4-/5   Right Shoulder Extension 4-/5   Right Shoulder ABduction 3+/5   Right Shoulder Internal Rotation 4-/5   Right Shoulder External Rotation 4-/5                     [facility] Adult PT Treatment/Exercise - 02/05/17 0001      Therapeutic Activites    ADL's reaching, carrying and driving simulation activities     Shoulder Exercises: Supine   Flexion Strengthening;Right;20 reps;Weights   Shoulder Flexion Weight (lbs) 2   Other Supine Exercises 90 degree elevation with 3#  12/6:00 15x;  9/3:00 10x each     Shoulder Exercises: Seated   Other Seated Exercises 4# elbow curls  30x     Shoulder Exercises: Sidelying   External Rotation Strengthening;Right;10 reps   External Rotation Weight (lbs) 3     Shoulder Exercises: Pulleys   Flexion 2 minutes                  PT Short Term Goals - 02/05/17 1039      PT SHORT TERM GOAL #1   Title be independent in initial HEP   Status Achieved     PT SHORT TERM GOAL #2   Title be able to wear bra due to pain in right shoulder decreased >/= 25%   Status Achieved     PT SHORT TERM GOAL #3   Title be able to lift right arm to place deodarant under the left arm with minimal pain     PT SHORT TERM GOAL #4   Title right shoulder strength </= 3-/5 to perfrom overhead activities inclucing reaching in cabinet and wash hair   Status Achieved     PT SHORT TERM GOAL #5   Title sleep in her bed due to right shoulder pain decreased >/= 25%   Status Achieved           PT  Long Term Goals - 15-Feb-2017 1021      PT LONG TERM GOAL #1   Title be independent in advanced HEP   Status Achieved     PT LONG TERM GOAL #2   Title reduce FOTO to < or = to 36% limitation   Status Partially Met     PT LONG TERM GOAL #3   Title report < or = to 3/10 Rt shoulder pain with use for self-care and ADLs   Status Achieved     PT LONG TERM GOAL #4   Title demonstrate Rt shoulder IR AROM to lacking < or = to 2 inches vs the Lt to improve self-care   Status Achieved     PT LONG TERM GOAL #5   Title  right shoulder AROM >/= 140 for flexion needed for washing hair   Status Achieved     PT LONG TERM GOAL #6   Title Right shoulder strength grossly 4-/5 needed for driving   Status Partially Met               Plan - 2017-02-15 1040    Clinical Impression Statement Overall 80% better.  Her shoulder ROM has significantly improved in all directions.  Functionally she is able to use her right UE spontaneously for ADLs although some difficulty lifting a  casserole dish.  She has not driven yet.    Strength grossly 4-/5 except abduction 3+/5.  Majority of rehab goals met.        Patient will benefit from skilled therapeutic intervention in order to improve the following deficits and impairments:     Visit Diagnosis: Chronic right shoulder pain  Muscle weakness (generalized)  Abnormal posture       G-Codes - 02-15-17 1052    Functional Assessment Tool Used FOTO   Functional Limitation Other PT primary   Other PT Primary Goal Status (I6962) At least 20 percent but less than 40 percent impaired, limited or restricted   Other PT Primary Discharge Status (X5284) At least 40 percent but less than 60 percent impaired, limited or restricted      Problem List Patient Active Problem List   Diagnosis Date Noted  . DVT (deep venous thrombosis) ([place]) 01/07/2013  . Pulmonary embolism ([place]) 01/05/2013  . GERD (gastroesophageal reflux disease) 01/05/2013    Alvera Singh February 15, 2017, 12:08 PM  [facility] [address] [place], [place], [address] Phone: [phone]   Fax:  [phone]  Name: Stacy Valencia MRN: [id number] Date of Birth: [birth year]

## 2017-02-19 DIAGNOSIS — K219 Gastro-esophageal reflux disease without esophagitis: Secondary | ICD-10-CM | POA: Diagnosis not present

## 2017-02-19 DIAGNOSIS — M8580 Other specified disorders of bone density and structure, unspecified site: Secondary | ICD-10-CM | POA: Diagnosis not present

## 2017-02-19 DIAGNOSIS — Z86711 Personal history of pulmonary embolism: Secondary | ICD-10-CM | POA: Diagnosis not present

## 2017-03-02 DIAGNOSIS — S43431A Superior glenoid labrum lesion of right shoulder, initial encounter: Secondary | ICD-10-CM | POA: Diagnosis not present

## 2017-03-02 DIAGNOSIS — M75111 Incomplete rotator cuff tear or rupture of right shoulder, not specified as traumatic: Secondary | ICD-10-CM | POA: Diagnosis not present

## 2017-06-22 ENCOUNTER — Other Ambulatory Visit: Payer: Self-pay | Admitting: Oncology

## 2017-06-22 DIAGNOSIS — I82409 Acute embolism and thrombosis of unspecified deep veins of unspecified lower extremity: Secondary | ICD-10-CM

## 2017-06-30 ENCOUNTER — Other Ambulatory Visit: Payer: Self-pay | Admitting: *Deleted

## 2017-06-30 DIAGNOSIS — I82409 Acute embolism and thrombosis of unspecified deep veins of unspecified lower extremity: Secondary | ICD-10-CM

## 2017-06-30 MED ORDER — RIVAROXABAN 20 MG PO TABS: 20.0000 mg | ORAL_TABLET | Freq: Every day | ORAL | 5 refills | Status: DC

## 2017-08-19 DIAGNOSIS — Z23 Encounter for immunization: Secondary | ICD-10-CM | POA: Diagnosis not present

## 2017-08-19 DIAGNOSIS — M109 Gout, unspecified: Secondary | ICD-10-CM | POA: Diagnosis not present

## 2017-08-27 DIAGNOSIS — E785 Hyperlipidemia, unspecified: Secondary | ICD-10-CM | POA: Diagnosis not present

## 2017-08-27 DIAGNOSIS — R300 Dysuria: Secondary | ICD-10-CM | POA: Diagnosis not present

## 2017-08-27 DIAGNOSIS — R7309 Other abnormal glucose: Secondary | ICD-10-CM | POA: Diagnosis not present

## 2017-08-27 DIAGNOSIS — Z0000 Encounter for general adult medical examination without abnormal findings: Secondary | ICD-10-CM | POA: Diagnosis not present

## 2017-08-27 DIAGNOSIS — M109 Gout, unspecified: Secondary | ICD-10-CM | POA: Diagnosis not present

## 2017-11-26 DIAGNOSIS — T7840XA Allergy, unspecified, initial encounter: Secondary | ICD-10-CM | POA: Diagnosis not present

## 2017-12-21 ENCOUNTER — Other Ambulatory Visit: Payer: Self-pay | Admitting: Hematology and Oncology

## 2017-12-21 DIAGNOSIS — I82409 Acute embolism and thrombosis of unspecified deep veins of unspecified lower extremity: Secondary | ICD-10-CM

## 2017-12-24 ENCOUNTER — Inpatient Hospital Stay: Payer: Medicare Other | Attending: Oncology | Admitting: Oncology

## 2017-12-24 VITALS — BP 154/63 | HR 93 | Temp 97.9°F | Resp 18 | Ht 68.0 in | Wt 216.6 lb

## 2017-12-24 DIAGNOSIS — Z86711 Personal history of pulmonary embolism: Secondary | ICD-10-CM | POA: Diagnosis not present

## 2017-12-24 DIAGNOSIS — I82409 Acute embolism and thrombosis of unspecified deep veins of unspecified lower extremity: Secondary | ICD-10-CM

## 2017-12-24 DIAGNOSIS — Z86718 Personal history of other venous thrombosis and embolism: Secondary | ICD-10-CM

## 2017-12-24 NOTE — Progress Notes (Signed)
Hematology and Oncology Follow Up Visit  Stacy Valencia [id number] Nov 17, 1933 84 y.o. 12/24/2017 10:05 AM [name], MD (Inactive)No ref. provider found   Principle Diagnosis: 84 year old woman with history of a pulmonary embolus diagnosed in January of 2014 where she presented with symptoms of shortness of breath and found to have a saddle pulmonary embolus. It appears to be provoked after a long car ride.   Prior Therapy: She is status post anticoagulation from her previous pulmonary embolus back in 2005.  Current therapy: Xarelto 20 mg daily since January of 2014.  Interim History: Stacy Valencia presents today for a followup visit with her husband. Since the last visit, she reports no major changes in her health. She continues on Xarelto without any complications. She denies any specific side effects associated with this medication. She is not reporting any bleeding. She denied epistaxis, hematochezia, hematuria or hemoptysis. She denied any deep vein thrombosis or pulmonary embolism.  She denies any recent hospitalizations or illnesses.  She denied difficulties obtaining this medication or taken it on a regular basis.  She does not report any headaches or blurry vision or double vision. Has not reported any chest pain or cough. Has not reported any nausea or vomiting or abdominal discomfort. Has not reported any hematochezia or melena. Has not reported easy bruisability, petechiae or rashes. She has not reported any syncope or change in her multiple status. Rest of her review of systems unremarkable.  Medications: I have reviewed the patient's current medications.  Current Outpatient Medications  Medication Sig Dispense Refill  . alendronate (FOSAMAX) 70 MG tablet Take 70 mg by mouth every Sunday. Take with a full glass of water on an empty stomach.    Marland Kitchen omeprazole (PRILOSEC) 20 MG capsule Take 20 mg by mouth daily.    Alveda Reasons 20 MG TABS tablet TAKE ONE TABLET BY MOUTH DAILY 30 tablet 5    No current facility-administered medications for this visit.      Allergies:  Allergies  Allergen Reactions  . Sulfa Antibiotics Nausea And Vomiting    Past Medical History, Surgical history, Social history, and Family History were reviewed and updated.   Physical Exam: Blood pressure (!) 154/63, pulse 93, temperature 97.9 F (36.6 C), temperature source Oral, resp. rate 18, height 5\' 8"  (1.727 m), weight 216 lb 9.6 oz (98.2 kg), SpO2 97 %. ECOG: 0 General appearance: Well-appearing woman without distress. Head: Normocephalic, without obvious abnormality without oral ulcers or thrush. Neck: no adenopathy no neck masses. Lymph nodes: Cervical, supraclavicular, and axillary nodes normal. Heart:regular rate and rhythm, S1, S2 normal, no murmur, click, rub or gallop Lung:chest clear, no wheezing, rales, normal symmetric air entry Abdomin: soft, non-tender, without masses or organomegaly no shifting dullness or ascites. EXT:no erythema, induration, or nodules   Lab Results: Lab Results  Component Value Date   WBC 7.7 12/20/2013   HGB 14.7 12/20/2013   HCT 43.9 12/20/2013   MCV 98.4 12/20/2013   PLT 247 12/20/2013     Chemistry      Component Value Date/Time   NA 138 12/20/2013 0952   K 4.3 12/20/2013 0952   CL 106 01/27/2013 1033   CO2 22 12/20/2013 0952   BUN 20.8 12/20/2013 0952   CREATININE 1.0 12/20/2013 0952      Component Value Date/Time   CALCIUM 9.7 12/20/2013 0952   ALKPHOS 84 12/20/2013 0952   AST 21 12/20/2013 0952   ALT 20 12/20/2013 0952   BILITOT 0.54 12/20/2013 7425  Impression and Plan:   84 year old woman with:  1. History of thrombosis on two separate episodes nine years apart. The first one in 2005 and the other one in 2014 were provoked by a long travel history. She had been on Xarelto since January of 2014. No clear cut inherited thrombophilia noted.  Risks and benefits of Xarelto was reviewed again.  Complication associated with  long-term anticoagulation was reviewed versus the risk of stopping it.  Given her history of recurrent thrombosis, she is agreeable to continue Xarelto indefinitely.  We can certainly revisit this in the future if she develops any complications related to this medication.  2. Follow-up: Follow-up will be as needed in the future.  Zola Button, MD 1/3/201910:05 AM

## 2017-12-30 ENCOUNTER — Telehealth: Payer: Self-pay | Admitting: Oncology

## 2017-12-30 NOTE — Telephone Encounter (Signed)
Per 1/3 no los °

## 2018-02-26 DIAGNOSIS — R7309 Other abnormal glucose: Secondary | ICD-10-CM | POA: Diagnosis not present

## 2018-02-26 DIAGNOSIS — M8580 Other specified disorders of bone density and structure, unspecified site: Secondary | ICD-10-CM | POA: Diagnosis not present

## 2018-02-26 DIAGNOSIS — Z86711 Personal history of pulmonary embolism: Secondary | ICD-10-CM | POA: Diagnosis not present

## 2018-02-26 DIAGNOSIS — K219 Gastro-esophageal reflux disease without esophagitis: Secondary | ICD-10-CM | POA: Diagnosis not present

## 2018-06-21 ENCOUNTER — Other Ambulatory Visit: Payer: Self-pay | Admitting: Oncology

## 2018-06-21 DIAGNOSIS — I82409 Acute embolism and thrombosis of unspecified deep veins of unspecified lower extremity: Secondary | ICD-10-CM

## 2018-08-30 DIAGNOSIS — Z0000 Encounter for general adult medical examination without abnormal findings: Secondary | ICD-10-CM | POA: Diagnosis not present

## 2018-08-30 DIAGNOSIS — M8580 Other specified disorders of bone density and structure, unspecified site: Secondary | ICD-10-CM | POA: Diagnosis not present

## 2018-08-30 DIAGNOSIS — Z86711 Personal history of pulmonary embolism: Secondary | ICD-10-CM | POA: Diagnosis not present

## 2018-08-30 DIAGNOSIS — Z23 Encounter for immunization: Secondary | ICD-10-CM | POA: Diagnosis not present

## 2018-08-30 DIAGNOSIS — E785 Hyperlipidemia, unspecified: Secondary | ICD-10-CM | POA: Diagnosis not present

## 2018-08-30 DIAGNOSIS — R7309 Other abnormal glucose: Secondary | ICD-10-CM | POA: Diagnosis not present

## 2018-10-18 DIAGNOSIS — M8588 Other specified disorders of bone density and structure, other site: Secondary | ICD-10-CM | POA: Diagnosis not present

## 2018-11-11 ENCOUNTER — Other Ambulatory Visit: Payer: Self-pay | Admitting: Oncology

## 2018-11-11 DIAGNOSIS — I82409 Acute embolism and thrombosis of unspecified deep veins of unspecified lower extremity: Secondary | ICD-10-CM

## 2018-11-15 DIAGNOSIS — J069 Acute upper respiratory infection, unspecified: Secondary | ICD-10-CM | POA: Diagnosis not present

## 2019-03-02 DIAGNOSIS — K219 Gastro-esophageal reflux disease without esophagitis: Secondary | ICD-10-CM | POA: Diagnosis not present

## 2019-03-02 DIAGNOSIS — M8580 Other specified disorders of bone density and structure, unspecified site: Secondary | ICD-10-CM | POA: Diagnosis not present

## 2019-03-02 DIAGNOSIS — R7309 Other abnormal glucose: Secondary | ICD-10-CM | POA: Diagnosis not present

## 2019-03-02 DIAGNOSIS — Z86711 Personal history of pulmonary embolism: Secondary | ICD-10-CM | POA: Diagnosis not present

## 2019-03-04 ENCOUNTER — Other Ambulatory Visit: Payer: Self-pay | Admitting: Oncology

## 2019-03-04 DIAGNOSIS — I82409 Acute embolism and thrombosis of unspecified deep veins of unspecified lower extremity: Secondary | ICD-10-CM

## 2019-06-02 ENCOUNTER — Other Ambulatory Visit: Payer: Self-pay | Admitting: Oncology

## 2019-06-02 DIAGNOSIS — I82409 Acute embolism and thrombosis of unspecified deep veins of unspecified lower extremity: Secondary | ICD-10-CM

## 2019-08-01 ENCOUNTER — Other Ambulatory Visit: Payer: Self-pay | Admitting: Oncology

## 2019-08-01 DIAGNOSIS — I82409 Acute embolism and thrombosis of unspecified deep veins of unspecified lower extremity: Secondary | ICD-10-CM

## 2019-08-31 ENCOUNTER — Other Ambulatory Visit: Payer: Self-pay | Admitting: Oncology

## 2019-08-31 DIAGNOSIS — I82409 Acute embolism and thrombosis of unspecified deep veins of unspecified lower extremity: Secondary | ICD-10-CM

## 2019-09-05 DIAGNOSIS — R7309 Other abnormal glucose: Secondary | ICD-10-CM | POA: Diagnosis not present

## 2019-09-05 DIAGNOSIS — E785 Hyperlipidemia, unspecified: Secondary | ICD-10-CM | POA: Diagnosis not present

## 2019-09-05 DIAGNOSIS — M8580 Other specified disorders of bone density and structure, unspecified site: Secondary | ICD-10-CM | POA: Diagnosis not present

## 2019-09-05 DIAGNOSIS — Z0000 Encounter for general adult medical examination without abnormal findings: Secondary | ICD-10-CM | POA: Diagnosis not present

## 2019-09-05 DIAGNOSIS — Z86711 Personal history of pulmonary embolism: Secondary | ICD-10-CM | POA: Diagnosis not present

## 2019-09-05 DIAGNOSIS — Z23 Encounter for immunization: Secondary | ICD-10-CM | POA: Diagnosis not present

## 2019-09-29 ENCOUNTER — Other Ambulatory Visit: Payer: Self-pay | Admitting: Oncology

## 2019-09-29 DIAGNOSIS — I82409 Acute embolism and thrombosis of unspecified deep veins of unspecified lower extremity: Secondary | ICD-10-CM

## 2019-10-24 ENCOUNTER — Other Ambulatory Visit: Payer: Self-pay | Admitting: Oncology

## 2019-10-24 DIAGNOSIS — I82409 Acute embolism and thrombosis of unspecified deep veins of unspecified lower extremity: Secondary | ICD-10-CM

## 2019-11-23 ENCOUNTER — Other Ambulatory Visit: Payer: Self-pay | Admitting: Oncology

## 2019-11-23 DIAGNOSIS — I82409 Acute embolism and thrombosis of unspecified deep veins of unspecified lower extremity: Secondary | ICD-10-CM

## 2019-12-23 ENCOUNTER — Other Ambulatory Visit: Payer: Self-pay | Admitting: Oncology

## 2019-12-23 DIAGNOSIS — I82409 Acute embolism and thrombosis of unspecified deep veins of unspecified lower extremity: Secondary | ICD-10-CM

## 2020-01-19 ENCOUNTER — Ambulatory Visit: Payer: Medicare Other

## 2020-01-23 ENCOUNTER — Other Ambulatory Visit: Payer: Self-pay | Admitting: Oncology

## 2020-01-23 DIAGNOSIS — I82409 Acute embolism and thrombosis of unspecified deep veins of unspecified lower extremity: Secondary | ICD-10-CM

## 2020-01-27 ENCOUNTER — Ambulatory Visit: Payer: Medicare Other | Attending: Internal Medicine

## 2020-01-27 DIAGNOSIS — Z23 Encounter for immunization: Secondary | ICD-10-CM | POA: Insufficient documentation

## 2020-01-27 NOTE — Progress Notes (Signed)
   Covid-19 Vaccination Clinic  Name:  Stacy Valencia    MRN: [id number] DOB: 06-29-1933  01/27/2020  [name] was observed post Covid-19 immunization for 15 minutes without incidence. She was provided with Vaccine Information Sheet and instruction to access the V-Safe system.   [name] was instructed to call 911 with any severe reactions post vaccine: Marland Kitchen Difficulty breathing  . Swelling of your face and throat  . A fast heartbeat  . A bad rash all over your body  . Dizziness and weakness    Immunizations Administered    Name Date Dose VIS Date Route   Pfizer COVID-19 Vaccine 01/27/2020  4:00 PM 0.3 mL 12/02/2019 Intramuscular   Manufacturer: [place]   Lot: [id number]   [place]: [id number]

## 2020-01-30 ENCOUNTER — Ambulatory Visit: Payer: Medicare Other

## 2020-02-18 ENCOUNTER — Other Ambulatory Visit: Payer: Self-pay | Admitting: Oncology

## 2020-02-18 DIAGNOSIS — I82409 Acute embolism and thrombosis of unspecified deep veins of unspecified lower extremity: Secondary | ICD-10-CM

## 2020-02-21 ENCOUNTER — Ambulatory Visit: Payer: Medicare Other | Attending: Internal Medicine

## 2020-02-21 DIAGNOSIS — Z23 Encounter for immunization: Secondary | ICD-10-CM

## 2020-02-21 NOTE — Progress Notes (Signed)
   Covid-19 Vaccination Clinic  Name:  Stacy Valencia    MRN: [id number] DOB: [birth year]  02/21/2020  [name] was observed post Covid-19 immunization for 15 minutes without incident. She was provided with Vaccine Information Sheet and instruction to access the V-Safe system.   [name] was instructed to call 911 with any severe reactions post vaccine: Marland Kitchen Difficulty breathing  . Swelling of face and throat  . A fast heartbeat  . A bad rash all over body  . Dizziness and weakness   Immunizations Administered    Name Date Dose VIS Date Route   Pfizer COVID-19 Vaccine 02/21/2020  3:19 PM 0.3 mL 12/02/2019 Intramuscular   Manufacturer: [place]   Lot: [id number]   [place]: [id number]

## 2020-03-05 DIAGNOSIS — K219 Gastro-esophageal reflux disease without esophagitis: Secondary | ICD-10-CM | POA: Diagnosis not present

## 2020-03-05 DIAGNOSIS — M8580 Other specified disorders of bone density and structure, unspecified site: Secondary | ICD-10-CM | POA: Diagnosis not present

## 2020-03-05 DIAGNOSIS — J309 Allergic rhinitis, unspecified: Secondary | ICD-10-CM | POA: Diagnosis not present

## 2020-03-05 DIAGNOSIS — Z86711 Personal history of pulmonary embolism: Secondary | ICD-10-CM | POA: Diagnosis not present

## 2020-03-05 DIAGNOSIS — E785 Hyperlipidemia, unspecified: Secondary | ICD-10-CM | POA: Diagnosis not present

## 2020-03-05 DIAGNOSIS — R7309 Other abnormal glucose: Secondary | ICD-10-CM | POA: Diagnosis not present

## 2020-03-19 ENCOUNTER — Other Ambulatory Visit: Payer: Self-pay | Admitting: Oncology

## 2020-03-19 DIAGNOSIS — I82409 Acute embolism and thrombosis of unspecified deep veins of unspecified lower extremity: Secondary | ICD-10-CM

## 2020-04-18 ENCOUNTER — Other Ambulatory Visit: Payer: Self-pay | Admitting: Oncology

## 2020-04-18 DIAGNOSIS — I82409 Acute embolism and thrombosis of unspecified deep veins of unspecified lower extremity: Secondary | ICD-10-CM

## 2020-05-18 ENCOUNTER — Other Ambulatory Visit: Payer: Self-pay | Admitting: Oncology

## 2020-05-18 DIAGNOSIS — I82409 Acute embolism and thrombosis of unspecified deep veins of unspecified lower extremity: Secondary | ICD-10-CM

## 2020-06-17 ENCOUNTER — Other Ambulatory Visit: Payer: Self-pay | Admitting: Oncology

## 2020-06-17 DIAGNOSIS — I82409 Acute embolism and thrombosis of unspecified deep veins of unspecified lower extremity: Secondary | ICD-10-CM

## 2020-07-17 ENCOUNTER — Other Ambulatory Visit: Payer: Self-pay | Admitting: Oncology

## 2020-07-17 DIAGNOSIS — I82409 Acute embolism and thrombosis of unspecified deep veins of unspecified lower extremity: Secondary | ICD-10-CM

## 2020-07-22 ENCOUNTER — Emergency Department (HOSPITAL_COMMUNITY): Payer: Medicare Other

## 2020-07-22 ENCOUNTER — Inpatient Hospital Stay (HOSPITAL_COMMUNITY)
Admission: EM | Admit: 2020-07-22 | Discharge: 2020-07-25 | DRG: 480 | Disposition: A | Discharge status: Rehabilitation Facility | Payer: Medicare Other | Attending: Internal Medicine | Admitting: Internal Medicine

## 2020-07-22 ENCOUNTER — Encounter (HOSPITAL_COMMUNITY): Payer: Self-pay | Admitting: Emergency Medicine

## 2020-07-22 ENCOUNTER — Other Ambulatory Visit: Payer: Self-pay

## 2020-07-22 DIAGNOSIS — Z86718 Personal history of other venous thrombosis and embolism: Secondary | ICD-10-CM

## 2020-07-22 DIAGNOSIS — Z7983 Long term (current) use of bisphosphonates: Secondary | ICD-10-CM

## 2020-07-22 DIAGNOSIS — Z79899 Other long term (current) drug therapy: Secondary | ICD-10-CM | POA: Diagnosis not present

## 2020-07-22 DIAGNOSIS — Z1619 Resistance to other specified beta lactam antibiotics: Secondary | ICD-10-CM | POA: Diagnosis present

## 2020-07-22 DIAGNOSIS — S72142A Displaced intertrochanteric fracture of left femur, initial encounter for closed fracture: Principal | ICD-10-CM | POA: Diagnosis present

## 2020-07-22 DIAGNOSIS — K2100 Gastro-esophageal reflux disease with esophagitis, without bleeding: Secondary | ICD-10-CM | POA: Diagnosis not present

## 2020-07-22 DIAGNOSIS — N179 Acute kidney failure, unspecified: Secondary | ICD-10-CM | POA: Diagnosis not present

## 2020-07-22 DIAGNOSIS — Z23 Encounter for immunization: Secondary | ICD-10-CM | POA: Diagnosis not present

## 2020-07-22 DIAGNOSIS — J9601 Acute respiratory failure with hypoxia: Secondary | ICD-10-CM | POA: Diagnosis not present

## 2020-07-22 DIAGNOSIS — F4322 Adjustment disorder with anxiety: Secondary | ICD-10-CM | POA: Diagnosis present

## 2020-07-22 DIAGNOSIS — Z90710 Acquired absence of both cervix and uterus: Secondary | ICD-10-CM | POA: Diagnosis not present

## 2020-07-22 DIAGNOSIS — R000 Tachycardia, unspecified: Secondary | ICD-10-CM

## 2020-07-22 DIAGNOSIS — Z86711 Personal history of pulmonary embolism: Secondary | ICD-10-CM

## 2020-07-22 DIAGNOSIS — K219 Gastro-esophageal reflux disease without esophagitis: Secondary | ICD-10-CM | POA: Diagnosis present

## 2020-07-22 DIAGNOSIS — S72002D Fracture of unspecified part of neck of left femur, subsequent encounter for closed fracture with routine healing: Secondary | ICD-10-CM | POA: Diagnosis present

## 2020-07-22 DIAGNOSIS — W01198A Fall on same level from slipping, tripping and stumbling with subsequent striking against other object, initial encounter: Secondary | ICD-10-CM | POA: Diagnosis not present

## 2020-07-22 DIAGNOSIS — E871 Hypo-osmolality and hyponatremia: Secondary | ICD-10-CM | POA: Diagnosis not present

## 2020-07-22 DIAGNOSIS — L03116 Cellulitis of left lower limb: Secondary | ICD-10-CM | POA: Diagnosis not present

## 2020-07-22 DIAGNOSIS — I2699 Other pulmonary embolism without acute cor pulmonale: Secondary | ICD-10-CM | POA: Diagnosis present

## 2020-07-22 DIAGNOSIS — R52 Pain, unspecified: Secondary | ICD-10-CM | POA: Diagnosis not present

## 2020-07-22 DIAGNOSIS — S72009S Fracture of unspecified part of neck of unspecified femur, sequela: Secondary | ICD-10-CM | POA: Diagnosis not present

## 2020-07-22 DIAGNOSIS — S72002A Fracture of unspecified part of neck of left femur, initial encounter for closed fracture: Secondary | ICD-10-CM | POA: Diagnosis not present

## 2020-07-22 DIAGNOSIS — Z7901 Long term (current) use of anticoagulants: Secondary | ICD-10-CM | POA: Diagnosis not present

## 2020-07-22 DIAGNOSIS — G8918 Other acute postprocedural pain: Secondary | ICD-10-CM | POA: Diagnosis present

## 2020-07-22 DIAGNOSIS — S72002S Fracture of unspecified part of neck of left femur, sequela: Secondary | ICD-10-CM | POA: Diagnosis not present

## 2020-07-22 DIAGNOSIS — Y9301 Activity, walking, marching and hiking: Secondary | ICD-10-CM | POA: Diagnosis present

## 2020-07-22 DIAGNOSIS — D62 Acute posthemorrhagic anemia: Secondary | ICD-10-CM | POA: Diagnosis not present

## 2020-07-22 DIAGNOSIS — W19XXXA Unspecified fall, initial encounter: Secondary | ICD-10-CM

## 2020-07-22 DIAGNOSIS — M62838 Other muscle spasm: Secondary | ICD-10-CM | POA: Diagnosis not present

## 2020-07-22 DIAGNOSIS — T402X5A Adverse effect of other opioids, initial encounter: Secondary | ICD-10-CM | POA: Diagnosis not present

## 2020-07-22 DIAGNOSIS — Z87898 Personal history of other specified conditions: Secondary | ICD-10-CM

## 2020-07-22 DIAGNOSIS — Z20822 Contact with and (suspected) exposure to covid-19: Secondary | ICD-10-CM | POA: Diagnosis present

## 2020-07-22 DIAGNOSIS — S72009A Fracture of unspecified part of neck of unspecified femur, initial encounter for closed fracture: Secondary | ICD-10-CM

## 2020-07-22 DIAGNOSIS — S299XXA Unspecified injury of thorax, initial encounter: Secondary | ICD-10-CM | POA: Diagnosis not present

## 2020-07-22 DIAGNOSIS — Z9981 Dependence on supplemental oxygen: Secondary | ICD-10-CM | POA: Diagnosis not present

## 2020-07-22 DIAGNOSIS — R5381 Other malaise: Secondary | ICD-10-CM | POA: Diagnosis present

## 2020-07-22 DIAGNOSIS — I10 Essential (primary) hypertension: Secondary | ICD-10-CM | POA: Diagnosis not present

## 2020-07-22 DIAGNOSIS — Y92002 Bathroom of unspecified non-institutional (private) residence single-family (private) house as the place of occurrence of the external cause: Secondary | ICD-10-CM | POA: Diagnosis not present

## 2020-07-22 DIAGNOSIS — B9689 Other specified bacterial agents as the cause of diseases classified elsewhere: Secondary | ICD-10-CM | POA: Diagnosis present

## 2020-07-22 DIAGNOSIS — W19XXXD Unspecified fall, subsequent encounter: Secondary | ICD-10-CM | POA: Diagnosis present

## 2020-07-22 DIAGNOSIS — N390 Urinary tract infection, site not specified: Secondary | ICD-10-CM | POA: Diagnosis not present

## 2020-07-22 DIAGNOSIS — Z419 Encounter for procedure for purposes other than remedying health state, unspecified: Secondary | ICD-10-CM

## 2020-07-22 DIAGNOSIS — M25552 Pain in left hip: Secondary | ICD-10-CM | POA: Diagnosis not present

## 2020-07-22 DIAGNOSIS — W010XXA Fall on same level from slipping, tripping and stumbling without subsequent striking against object, initial encounter: Secondary | ICD-10-CM | POA: Diagnosis present

## 2020-07-22 DIAGNOSIS — R110 Nausea: Secondary | ICD-10-CM | POA: Diagnosis not present

## 2020-07-22 DIAGNOSIS — I82409 Acute embolism and thrombosis of unspecified deep veins of unspecified lower extremity: Secondary | ICD-10-CM | POA: Diagnosis not present

## 2020-07-22 DIAGNOSIS — K5900 Constipation, unspecified: Secondary | ICD-10-CM | POA: Diagnosis not present

## 2020-07-22 DIAGNOSIS — Z882 Allergy status to sulfonamides status: Secondary | ICD-10-CM | POA: Diagnosis not present

## 2020-07-22 DIAGNOSIS — G92 Toxic encephalopathy: Secondary | ICD-10-CM | POA: Diagnosis not present

## 2020-07-22 DIAGNOSIS — R0902 Hypoxemia: Secondary | ICD-10-CM | POA: Diagnosis not present

## 2020-07-22 HISTORY — DX: Fracture of unspecified part of neck of unspecified femur, initial encounter for closed fracture: S72.009A

## 2020-07-22 LAB — SARS CORONAVIRUS 2 BY RT PCR (HOSPITAL ORDER, PERFORMED IN ~~LOC~~ HOSPITAL LAB): SARS Coronavirus 2: NEGATIVE

## 2020-07-22 LAB — CBC WITH DIFFERENTIAL/PLATELET
Abs Immature Granulocytes: 0.11 10*3/uL — ABNORMAL HIGH (ref 0.00–0.07)
Basophils Absolute: 0.1 10*3/uL (ref 0.0–0.1)
Basophils Relative: 1 %
Eosinophils Absolute: 0.1 10*3/uL (ref 0.0–0.5)
Eosinophils Relative: 1 %
HCT: 43.8 % (ref 36.0–46.0)
Hemoglobin: 14.1 g/dL (ref 12.0–15.0)
Immature Granulocytes: 1 %
Lymphocytes Relative: 26 %
Lymphs Abs: 3.3 10*3/uL (ref 0.7–4.0)
MCH: 33.6 pg (ref 26.0–34.0)
MCHC: 32.2 g/dL (ref 30.0–36.0)
MCV: 104.3 fL — ABNORMAL HIGH (ref 80.0–100.0)
Monocytes Absolute: 0.9 10*3/uL (ref 0.1–1.0)
Monocytes Relative: 7 %
Neutro Abs: 8.3 10*3/uL — ABNORMAL HIGH (ref 1.7–7.7)
Neutrophils Relative %: 64 %
Platelets: 211 10*3/uL (ref 150–400)
RBC: 4.2 MIL/uL (ref 3.87–5.11)
RDW: 14.5 % (ref 11.5–15.5)
WBC: 12.8 10*3/uL — ABNORMAL HIGH (ref 4.0–10.5)
nRBC: 0 % (ref 0.0–0.2)

## 2020-07-22 LAB — URINALYSIS, ROUTINE W REFLEX MICROSCOPIC
Bilirubin Urine: NEGATIVE
Glucose, UA: NEGATIVE mg/dL
Hgb urine dipstick: NEGATIVE
Ketones, ur: NEGATIVE mg/dL
Leukocytes,Ua: NEGATIVE
Nitrite: NEGATIVE
Protein, ur: NEGATIVE mg/dL
Specific Gravity, Urine: 1.013 (ref 1.005–1.030)
pH: 5 (ref 5.0–8.0)

## 2020-07-22 LAB — BASIC METABOLIC PANEL
Anion gap: 11 (ref 5–15)
BUN: 12 mg/dL (ref 8–23)
CO2: 20 mmol/L — ABNORMAL LOW (ref 22–32)
Calcium: 8.8 mg/dL — ABNORMAL LOW (ref 8.9–10.3)
Chloride: 106 mmol/L (ref 98–111)
Creatinine, Ser: 0.95 mg/dL (ref 0.44–1.00)
GFR calc Af Amer: >60 mL/min (ref >60)
GFR calc non Af Amer: 55 mL/min — ABNORMAL LOW (ref >60)
Glucose, Bld: 165 mg/dL — ABNORMAL HIGH (ref 70–99)
Potassium: 3.9 mmol/L (ref 3.5–5.1)
Sodium: 137 mmol/L (ref 135–145)

## 2020-07-22 LAB — I-STAT CHEM 8, ED
BUN: 13 mg/dL (ref 8–23)
Calcium, Ion: 1.13 mmol/L — ABNORMAL LOW (ref 1.15–1.40)
Chloride: 106 mmol/L (ref 98–111)
Creatinine, Ser: 0.9 mg/dL (ref 0.44–1.00)
Glucose, Bld: 162 mg/dL — ABNORMAL HIGH (ref 70–99)
HCT: 48 % — ABNORMAL HIGH (ref 36.0–46.0)
Hemoglobin: 16.3 g/dL — ABNORMAL HIGH (ref 12.0–15.0)
Potassium: 3.9 mmol/L (ref 3.5–5.1)
Sodium: 141 mmol/L (ref 135–145)
TCO2: 20 mmol/L — ABNORMAL LOW (ref 22–32)

## 2020-07-22 LAB — PROTIME-INR
INR: 2.7 — ABNORMAL HIGH (ref 0.8–1.2)
Prothrombin Time: 27.9 seconds — ABNORMAL HIGH (ref 11.4–15.2)

## 2020-07-22 MED ORDER — CHLORHEXIDINE GLUCONATE 4 % EX LIQD
60.0000 mL | Freq: Once | CUTANEOUS | Status: AC
  Administered 2020-07-23: 4 via TOPICAL
  Filled 2020-07-22 (×2): qty 60

## 2020-07-22 MED ORDER — POVIDONE-IODINE 10 % EX SWAB: 2.0000 "application " | Freq: Once | CUTANEOUS | Status: DC

## 2020-07-22 MED ORDER — MORPHINE SULFATE (PF) 2 MG/ML IV SOLN
2.0000 mg | INTRAVENOUS | Status: DC | PRN
  Administered 2020-07-22: 2 mg via INTRAVENOUS
  Filled 2020-07-22: qty 1

## 2020-07-22 MED ORDER — CEFAZOLIN SODIUM-DEXTROSE 2-4 GM/100ML-% IV SOLN: 2.0000 g | INTRAVENOUS | Status: DC

## 2020-07-22 MED ORDER — MORPHINE SULFATE (PF) 2 MG/ML IV SOLN
0.5000 mg | INTRAVENOUS | Status: DC | PRN
  Administered 2020-07-22: 0.5 mg via INTRAVENOUS
  Filled 2020-07-22: qty 1

## 2020-07-22 MED ORDER — PANTOPRAZOLE SODIUM 40 MG PO TBEC
40.0000 mg | DELAYED_RELEASE_TABLET | Freq: Every day | ORAL | Status: DC
  Administered 2020-07-22 – 2020-07-25 (×3): 40 mg via ORAL
  Filled 2020-07-22 (×3): qty 1

## 2020-07-22 MED ORDER — FENTANYL CITRATE (PF) 100 MCG/2ML IJ SOLN
50.0000 ug | Freq: Once | INTRAMUSCULAR | Status: AC
  Administered 2020-07-22: 50 ug via INTRAVENOUS
  Filled 2020-07-22: qty 2

## 2020-07-22 MED ORDER — TRANEXAMIC ACID-NACL 1000-0.7 MG/100ML-% IV SOLN
1000.0000 mg | INTRAVENOUS | Status: AC
  Administered 2020-07-23: 1000 mg via INTRAVENOUS
  Filled 2020-07-22: qty 100

## 2020-07-22 MED ORDER — TETANUS-DIPHTH-ACELL PERTUSSIS 5-2.5-18.5 LF-MCG/0.5 IM SUSP
0.5000 mL | Freq: Once | INTRAMUSCULAR | Status: AC
  Administered 2020-07-22: 0.5 mL via INTRAMUSCULAR
  Filled 2020-07-22: qty 0.5

## 2020-07-22 MED ORDER — HYDROMORPHONE HCL 1 MG/ML IJ SOLN
2.0000 mg | INTRAMUSCULAR | Status: DC | PRN
  Administered 2020-07-22 – 2020-07-23 (×3): 2 mg via INTRAVENOUS
  Filled 2020-07-22 (×3): qty 2

## 2020-07-22 MED ORDER — HYDROCODONE-ACETAMINOPHEN 5-325 MG PO TABS
1.0000 | ORAL_TABLET | Freq: Four times a day (QID) | ORAL | Status: DC | PRN
  Administered 2020-07-22 – 2020-07-25 (×6): 2 via ORAL
  Filled 2020-07-22 (×6): qty 2

## 2020-07-22 NOTE — Consult Note (Signed)
Reason for Consult: left hip fracture Referring Physician: Jamse Arn, MD   Stacy Valencia is an 86 y.o. female.  HPI: Stacy Valencia is a 86 y.o. female with a history of pulmonary embolism on Xarelto who presents to the emergency department with complaints of left hip pain status post mechanical fall just prior to arrival. Patient states that she was walking to the bathroom when she tripped over a scale leading to a fall onto her left hip. She denies head injury or loss of consciousness. She was unable to get up without assistance prompting 911 call. She states that she also scraped her left elbow but this is not particularly painful. Pain in her hip is worse with movement, alleviated with fentanyl provided by EMS in route. Per EMS for additional history gave 100 mcg of fentanyl and 4 mg of Zofran. Patient denies headache, neck pain, back pain, chest pain, shortness of breath, abdominal pain, numbness, tingling, or weakness. Last p.o. intake was 8 PM last night.  Past Medical History:  Diagnosis Date  . GERD (gastroesophageal reflux disease)   . PE (pulmonary embolism) 2005    Past Surgical History:  Procedure Laterality Date  . ABDOMINAL HYSTERECTOMY    . APPENDECTOMY    . arthroscopic shoulder surgery Right 10/28/2016   decompression of SAD/DCR    Family History  Problem Relation Age of Onset  . Pulmonary embolism Neg Hx     Social History:  reports that she has never smoked. She has never used smokeless tobacco. She reports that she does not drink alcohol and does not use drugs.  Allergies:  Allergies  Allergen Reactions  . Sulfa Antibiotics Nausea And Vomiting    Medications:  I have reviewed the patient's current medications. Scheduled: . pantoprazole  40 mg Oral Daily    Results for orders placed or performed during the hospital encounter of 07/22/20 (from the past 24 hour(s))  CBC with Differential/Platelet     Status: Abnormal   Collection Time: 07/22/20  5:16 AM   Result Value Ref Range   WBC 12.8 (H) 4.0 - 10.5 K/uL   RBC 4.20 3.87 - 5.11 MIL/uL   Hemoglobin 14.1 12.0 - 15.0 g/dL   HCT 43.8 36 - 46 %   MCV 104.3 (H) 80.0 - 100.0 fL   MCH 33.6 26.0 - 34.0 pg   MCHC 32.2 30.0 - 36.0 g/dL   RDW 14.5 11.5 - 15.5 %   Platelets 211 150 - 400 K/uL   nRBC 0.0 0.0 - 0.2 %   Neutrophils Relative % 64 %   Neutro Abs 8.3 (H) 1.7 - 7.7 K/uL   Lymphocytes Relative 26 %   Lymphs Abs 3.3 0.7 - 4.0 K/uL   Monocytes Relative 7 %   Monocytes Absolute 0.9 0 - 1 K/uL   Eosinophils Relative 1 %   Eosinophils Absolute 0.1 0 - 0 K/uL   Basophils Relative 1 %   Basophils Absolute 0.1 0 - 0 K/uL   Immature Granulocytes 1 %   Abs Immature Granulocytes 0.11 (H) 0.00 - 0.07 K/uL  Protime-INR     Status: Abnormal   Collection Time: 07/22/20  5:16 AM  Result Value Ref Range   Prothrombin Time 27.9 (H) 11.4 - 15.2 seconds   INR 2.7 (H) 0.8 - 1.2  Basic metabolic panel     Status: Abnormal   Collection Time: 07/22/20  5:16 AM  Result Value Ref Range   Sodium 137 135 - 145 mmol/L   Potassium 3.9  3.5 - 5.1 mmol/L   Chloride 106 98 - 111 mmol/L   CO2 20 (L) 22 - 32 mmol/L   Glucose, Bld 165 (H) 70 - 99 mg/dL   BUN 12 8 - 23 mg/dL   Creatinine, Ser 0.95 0.44 - 1.00 mg/dL   Calcium 8.8 (L) 8.9 - 10.3 mg/dL   GFR calc non Af Amer 55 (L) >60 mL/min   GFR calc Af Amer >60 >60 mL/min   Anion gap 11 5 - 15  SARS Coronavirus 2 by RT PCR (hospital order, performed in [place] hospital lab) Nasopharyngeal Nasopharyngeal Swab     Status: None   Collection Time: 07/22/20  5:16 AM   Specimen: Nasopharyngeal Swab  Result Value Ref Range   SARS Coronavirus 2 NEGATIVE NEGATIVE  I-stat chem 8, ED (not at [facility] or [facility])     Status: Abnormal   Collection Time: 07/22/20  5:25 AM  Result Value Ref Range   Sodium 141 135 - 145 mmol/L   Potassium 3.9 3.5 - 5.1 mmol/L   Chloride 106 98 - 111 mmol/L   BUN 13 8 - 23 mg/dL   Creatinine, Ser 0.90 0.44 - 1.00 mg/dL   Glucose, Bld  162 (H) 70 - 99 mg/dL   Calcium, Ion 1.13 (L) 1.15 - 1.40 mmol/L   TCO2 20 (L) 22 - 32 mmol/L   Hemoglobin 16.3 (H) 12.0 - 15.0 g/dL   HCT 48.0 (H) 36 - 46 %  Urinalysis, Routine w reflex microscopic     Status: None   Collection Time: 07/22/20  9:12 AM  Result Value Ref Range   Color, Urine YELLOW YELLOW   APPearance CLEAR CLEAR   Specific Gravity, Urine 1.013 1.005 - 1.030   pH 5.0 5.0 - 8.0   Glucose, UA NEGATIVE NEGATIVE mg/dL   Hgb urine dipstick NEGATIVE NEGATIVE   Bilirubin Urine NEGATIVE NEGATIVE   Ketones, ur NEGATIVE NEGATIVE mg/dL   Protein, ur NEGATIVE NEGATIVE mg/dL   Nitrite NEGATIVE NEGATIVE   Leukocytes,Ua NEGATIVE NEGATIVE    X-ray: CLINICAL DATA:  Pt presents to ED BIB [facility] from home. Pt c/o L hip pain and L elbow pain. Pt reports that she had mechanical trip/fall this morning. Pt states that she fell onto L hip and L elbow. Denies head injury or trauma, denies LOC. Pt is on blood thinners.  EXAM: DG HIP (WITH OR WITHOUT PELVIS) 2-3V LEFT  COMPARISON:  None.  FINDINGS: Comminuted, displaced intertrochanteric fracture of the left proximal femur. The femoral neck fracture component is displaced laterally relation to the shaft component, by 1.5 cm. Mild varus angulation.  No other fractures.  No bone lesions.  Hip joints, SI joints and pubic symphysis are normally aligned.  Skeletal structures are demineralized.  Soft tissues are unremarkable.  IMPRESSION: 1. Comminuted, mildly displaced and varus angulated intertrochanteric fracture of the proximal left femur. No dislocation.   Electronically Signed   By: Lajean Manes M.D  ROS: Review of Systems  Constitutional: Negative for chills and fever.  Eyes: Negative for visual disturbance.  Respiratory: Negative for shortness of breath.   Cardiovascular: Negative for chest pain.  Gastrointestinal: Negative for abdominal pain and vomiting.  Musculoskeletal: Positive for arthralgias.  Negative for back pain and neck pain.  Skin: Positive for wound.  Neurological: Negative for dizziness, seizures, syncope, speech difficulty, weakness, light-headedness, numbness and headaches.  All other systems reviewed and are negative  Blood pressure (!) 108/61, pulse (!) 107, temperature 97.8 F (36.6 C), temperature  source Oral, resp. rate 19, height 5\' 6"  (1.676 m), weight (!) 95.3 kg, SpO2 93 %.  Physical Exam: Vitals and nursing note reviewed.  Constitutional:      General: She is not in acute distress.    Appearance: She is well-developed.  HENT:     Head: Normocephalic and atraumatic. No raccoon eyes or Battle's sign.     Right Ear: No hemotympanum.     Left Ear: No hemotympanum.  Eyes:     General:        Right eye: No discharge.        Left eye: No discharge.     Conjunctiva/sclera: Conjunctivae normal.     Pupils: Pupils are equal, round, and reactive to light.  Neck:     Comments: No midline spinal tenderness. Cardiovascular:     Rate and Rhythm: Normal rate and regular rhythm.     Heart sounds: No murmur heard.      Comments: 2+ symmetric radial and DP pulses. Pulmonary:     Effort: No respiratory distress.     Breath sounds: Normal breath sounds. No wheezing or rales.  Chest:     Chest wall: No tenderness.  Abdominal:     General: There is no distension.     Palpations: Abdomen is soft.     Tenderness: There is no abdominal tenderness. There is no guarding or rebound.  Musculoskeletal:     Cervical back: Normal range of motion and neck supple. No spinous process tenderness.     Comments: Upper extremities: Patient has an abrasion to the left posterior elbow without active bleeding. She is intact active range of motion throughout. No focal bony tenderness Back: No midline tenderness Lower extremities: Patient has an obvious deformity with shortening and external rotation of the left lower extremity. There are no significant open wounds. She has intact active  range of motion throughout the right lower extremity. Left lower extremity range of motion limited secondary to pain. Patient is tender to palpation over the left antero lateral hip in the greater trochanter. Lower extremities are otherwise nontender.  Skin:    General: Skin is warm and dry.     Findings: No rash.  Neurological:     Comments: Alert. Clear speech. Sensation grossly intact bilateral upper and lower extremities. 5 out of 5 symmetric grip strength. 5 out of 5 strength with plantar dorsiflexion bilaterally.  Psychiatric:        Behavior: Behavior normal.   Assessment/Plan: 1. Comminuted left IT hip fracture  Plan: Admit to medicine per protocol Hold Xarelto No further DVT chemoprophylaxis, can place SCDs on RLE NPO after MN for OR tomorrow after being off Xarelto for 36hrs or so Operation will likely be performed by Dr. Lyla Glassing or myself Orders placed Bedrest for now  Mauri Pole 07/22/2020, 11:09 AM

## 2020-07-22 NOTE — H&P (View-Only) (Signed)
Reason for Consult: left hip fracture Referring Physician: Jamse Arn, MD   Stacy Valencia is an 86 y.o. female.  HPI: Stacy Valencia is a 86 y.o. female with a history of pulmonary embolism on Xarelto who presents to the emergency department with complaints of left hip pain status post mechanical fall just prior to arrival. Patient states that she was walking to the bathroom when she tripped over a scale leading to a fall onto her left hip. She denies head injury or loss of consciousness. She was unable to get up without assistance prompting 911 call. She states that she also scraped her left elbow but this is not particularly painful. Pain in her hip is worse with movement, alleviated with fentanyl provided by EMS in route. Per EMS for additional history gave 100 mcg of fentanyl and 4 mg of Zofran. Patient denies headache, neck pain, back pain, chest pain, shortness of breath, abdominal pain, numbness, tingling, or weakness. Last p.o. intake was 8 PM last night.  Past Medical History:  Diagnosis Date  . GERD (gastroesophageal reflux disease)   . PE (pulmonary embolism) 2005    Past Surgical History:  Procedure Laterality Date  . ABDOMINAL HYSTERECTOMY    . APPENDECTOMY    . arthroscopic shoulder surgery Right 10/28/2016   decompression of SAD/DCR    Family History  Problem Relation Age of Onset  . Pulmonary embolism Neg Hx     Social History:  reports that she has never smoked. She has never used smokeless tobacco. She reports that she does not drink alcohol and does not use drugs.  Allergies:  Allergies  Allergen Reactions  . Sulfa Antibiotics Nausea And Vomiting    Medications:  I have reviewed the patient's current medications. Scheduled: . pantoprazole  40 mg Oral Daily    Results for orders placed or performed during the hospital encounter of 07/22/20 (from the past 24 hour(s))  CBC with Differential/Platelet     Status: Abnormal   Collection Time: 07/22/20  5:16 AM   Result Value Ref Range   WBC 12.8 (H) 4.0 - 10.5 K/uL   RBC 4.20 3.87 - 5.11 MIL/uL   Hemoglobin 14.1 12.0 - 15.0 g/dL   HCT 43.8 36 - 46 %   MCV 104.3 (H) 80.0 - 100.0 fL   MCH 33.6 26.0 - 34.0 pg   MCHC 32.2 30.0 - 36.0 g/dL   RDW 14.5 11.5 - 15.5 %   Platelets 211 150 - 400 K/uL   nRBC 0.0 0.0 - 0.2 %   Neutrophils Relative % 64 %   Neutro Abs 8.3 (H) 1.7 - 7.7 K/uL   Lymphocytes Relative 26 %   Lymphs Abs 3.3 0.7 - 4.0 K/uL   Monocytes Relative 7 %   Monocytes Absolute 0.9 0 - 1 K/uL   Eosinophils Relative 1 %   Eosinophils Absolute 0.1 0 - 0 K/uL   Basophils Relative 1 %   Basophils Absolute 0.1 0 - 0 K/uL   Immature Granulocytes 1 %   Abs Immature Granulocytes 0.11 (H) 0.00 - 0.07 K/uL  Protime-INR     Status: Abnormal   Collection Time: 07/22/20  5:16 AM  Result Value Ref Range   Prothrombin Time 27.9 (H) 11.4 - 15.2 seconds   INR 2.7 (H) 0.8 - 1.2  Basic metabolic panel     Status: Abnormal   Collection Time: 07/22/20  5:16 AM  Result Value Ref Range   Sodium 137 135 - 145 mmol/L   Potassium 3.9  3.5 - 5.1 mmol/L   Chloride 106 98 - 111 mmol/L   CO2 20 (L) 22 - 32 mmol/L   Glucose, Bld 165 (H) 70 - 99 mg/dL   BUN 12 8 - 23 mg/dL   Creatinine, Ser 0.95 0.44 - 1.00 mg/dL   Calcium 8.8 (L) 8.9 - 10.3 mg/dL   GFR calc non Af Amer 55 (L) >60 mL/min   GFR calc Af Amer >60 >60 mL/min   Anion gap 11 5 - 15  SARS Coronavirus 2 by RT PCR (hospital order, performed in [place] hospital lab) Nasopharyngeal Nasopharyngeal Swab     Status: None   Collection Time: 07/22/20  5:16 AM   Specimen: Nasopharyngeal Swab  Result Value Ref Range   SARS Coronavirus 2 NEGATIVE NEGATIVE  I-stat chem 8, ED (not at [facility] or [facility])     Status: Abnormal   Collection Time: 07/22/20  5:25 AM  Result Value Ref Range   Sodium 141 135 - 145 mmol/L   Potassium 3.9 3.5 - 5.1 mmol/L   Chloride 106 98 - 111 mmol/L   BUN 13 8 - 23 mg/dL   Creatinine, Ser 0.90 0.44 - 1.00 mg/dL   Glucose, Bld  162 (H) 70 - 99 mg/dL   Calcium, Ion 1.13 (L) 1.15 - 1.40 mmol/L   TCO2 20 (L) 22 - 32 mmol/L   Hemoglobin 16.3 (H) 12.0 - 15.0 g/dL   HCT 48.0 (H) 36 - 46 %  Urinalysis, Routine w reflex microscopic     Status: None   Collection Time: 07/22/20  9:12 AM  Result Value Ref Range   Color, Urine YELLOW YELLOW   APPearance CLEAR CLEAR   Specific Gravity, Urine 1.013 1.005 - 1.030   pH 5.0 5.0 - 8.0   Glucose, UA NEGATIVE NEGATIVE mg/dL   Hgb urine dipstick NEGATIVE NEGATIVE   Bilirubin Urine NEGATIVE NEGATIVE   Ketones, ur NEGATIVE NEGATIVE mg/dL   Protein, ur NEGATIVE NEGATIVE mg/dL   Nitrite NEGATIVE NEGATIVE   Leukocytes,Ua NEGATIVE NEGATIVE    X-ray: CLINICAL DATA:  Pt presents to ED BIB [facility] from home. Pt c/o L hip pain and L elbow pain. Pt reports that she had mechanical trip/fall this morning. Pt states that she fell onto L hip and L elbow. Denies head injury or trauma, denies LOC. Pt is on blood thinners.  EXAM: DG HIP (WITH OR WITHOUT PELVIS) 2-3V LEFT  COMPARISON:  None.  FINDINGS: Comminuted, displaced intertrochanteric fracture of the left proximal femur. The femoral neck fracture component is displaced laterally relation to the shaft component, by 1.5 cm. Mild varus angulation.  No other fractures.  No bone lesions.  Hip joints, SI joints and pubic symphysis are normally aligned.  Skeletal structures are demineralized.  Soft tissues are unremarkable.  IMPRESSION: 1. Comminuted, mildly displaced and varus angulated intertrochanteric fracture of the proximal left femur. No dislocation.   Electronically Signed   By: Lajean Manes M.D  ROS: Review of Systems  Constitutional: Negative for chills and fever.  Eyes: Negative for visual disturbance.  Respiratory: Negative for shortness of breath.   Cardiovascular: Negative for chest pain.  Gastrointestinal: Negative for abdominal pain and vomiting.  Musculoskeletal: Positive for arthralgias.  Negative for back pain and neck pain.  Skin: Positive for wound.  Neurological: Negative for dizziness, seizures, syncope, speech difficulty, weakness, light-headedness, numbness and headaches.  All other systems reviewed and are negative  Blood pressure (!) 108/61, pulse (!) 107, temperature 97.8 F (36.6 C), temperature  source Oral, resp. rate 19, height 5\' 6"  (1.676 m), weight (!) 95.3 kg, SpO2 93 %.  Physical Exam: Vitals and nursing note reviewed.  Constitutional:      General: She is not in acute distress.    Appearance: She is well-developed.  HENT:     Head: Normocephalic and atraumatic. No raccoon eyes or Battle's sign.     Right Ear: No hemotympanum.     Left Ear: No hemotympanum.  Eyes:     General:        Right eye: No discharge.        Left eye: No discharge.     Conjunctiva/sclera: Conjunctivae normal.     Pupils: Pupils are equal, round, and reactive to light.  Neck:     Comments: No midline spinal tenderness. Cardiovascular:     Rate and Rhythm: Normal rate and regular rhythm.     Heart sounds: No murmur heard.      Comments: 2+ symmetric radial and DP pulses. Pulmonary:     Effort: No respiratory distress.     Breath sounds: Normal breath sounds. No wheezing or rales.  Chest:     Chest wall: No tenderness.  Abdominal:     General: There is no distension.     Palpations: Abdomen is soft.     Tenderness: There is no abdominal tenderness. There is no guarding or rebound.  Musculoskeletal:     Cervical back: Normal range of motion and neck supple. No spinous process tenderness.     Comments: Upper extremities: Patient has an abrasion to the left posterior elbow without active bleeding. She is intact active range of motion throughout. No focal bony tenderness Back: No midline tenderness Lower extremities: Patient has an obvious deformity with shortening and external rotation of the left lower extremity. There are no significant open wounds. She has intact active  range of motion throughout the right lower extremity. Left lower extremity range of motion limited secondary to pain. Patient is tender to palpation over the left antero lateral hip in the greater trochanter. Lower extremities are otherwise nontender.  Skin:    General: Skin is warm and dry.     Findings: No rash.  Neurological:     Comments: Alert. Clear speech. Sensation grossly intact bilateral upper and lower extremities. 5 out of 5 symmetric grip strength. 5 out of 5 strength with plantar dorsiflexion bilaterally.  Psychiatric:        Behavior: Behavior normal.   Assessment/Plan: 1. Comminuted left IT hip fracture  Plan: Admit to medicine per protocol Hold Xarelto No further DVT chemoprophylaxis, can place SCDs on RLE NPO after MN for OR tomorrow after being off Xarelto for 36hrs or so Operation will likely be performed by Dr. Lyla Glassing or myself Orders placed Bedrest for now  Mauri Pole 07/22/2020, 11:09 AM

## 2020-07-22 NOTE — ED Provider Notes (Signed)
[name]   CSN: [id number] Arrival date & time: 07/22/20  0453     History Chief Complaint  Patient presents with  . Fall    Stacy Valencia is a 86 y.o. female with a history of pulmonary embolism on Xarelto who presents to the emergency department with complaints of left hip pain status post mechanical fall just prior to arrival. Patient states that she was walking to the bathroom when she tripped over a scale leading to a fall onto her left hip. She denies head injury or loss of consciousness. She was unable to get up without assistance prompting 911 call. She states that she also scraped her left elbow but this is not particularly painful. Pain in her hip is worse with movement, alleviated with fentanyl provided by EMS in route. Per EMS for additional history gave 100 mcg of fentanyl and 4 mg of Zofran. Patient denies headache, neck pain, back pain, chest pain, shortness of breath, abdominal pain, numbness, tingling, or weakness. Last p.o. intake was 8 PM last night.  HPI     Past Medical History:  Diagnosis Date  . GERD (gastroesophageal reflux disease)   . PE (pulmonary embolism) 2005    Patient Active Problem List   Diagnosis Date Noted  . DVT (deep venous thrombosis) ([place]) 01/07/2013  . Pulmonary embolism ([place]) 01/05/2013  . GERD (gastroesophageal reflux disease) 01/05/2013    Past Surgical History:  Procedure Laterality Date  . ABDOMINAL HYSTERECTOMY    . APPENDECTOMY    . arthroscopic shoulder surgery Right 10/28/2016   decompression of SAD/DCR     OB History   No obstetric history on file.     Family History  Problem Relation Age of Onset  . Pulmonary embolism Neg Hx     Social History   Tobacco Use  . Smoking status: Never Smoker  . Smokeless tobacco: Never Used  Substance Use Topics  . Alcohol use: No    Comment: occasionally  . Drug use: No    Home Medications Prior to Admission  medications   Medication Sig Start Date End Date Taking? Authorizing Provider  alendronate (FOSAMAX) 70 MG tablet Take 70 mg by mouth every Sunday. Take with a full glass of water on an empty stomach. 12/21/16   [provider]  omeprazole (PRILOSEC) 20 MG capsule Take 20 mg by mouth daily.    [provider]  XARELTO 20 MG TABS tablet TAKE ONE TABLET BY MOUTH DAILY 07/17/20   Wyatt Portela, MD    Allergies    Sulfa antibiotics  Review of Systems   Review of Systems  Constitutional: Negative for chills and fever.  Eyes: Negative for visual disturbance.  Respiratory: Negative for shortness of breath.   Cardiovascular: Negative for chest pain.  Gastrointestinal: Negative for abdominal pain and vomiting.  Musculoskeletal: Positive for arthralgias. Negative for back pain and neck pain.  Skin: Positive for wound.  Neurological: Negative for dizziness, seizures, syncope, speech difficulty, weakness, light-headedness, numbness and headaches.  All other systems reviewed and are negative.   Physical Exam Updated Vital Signs BP (!) 135/94   Pulse 98   Temp 97.8 F (36.6 C) (Oral)   Resp 14   Ht 5\' 6"  (1.676 m)   Wt (!) 95.3 kg   SpO2 94%   BMI 33.89 kg/m   Physical Exam Vitals and nursing note reviewed.  Constitutional:      General: She is not in acute distress.  Appearance: She is well-developed.  HENT:     Head: Normocephalic and atraumatic. No raccoon eyes or Battle's sign.     Right Ear: No hemotympanum.     Left Ear: No hemotympanum.  Eyes:     General:        Right eye: No discharge.        Left eye: No discharge.     Conjunctiva/sclera: Conjunctivae normal.     Pupils: Pupils are equal, round, and reactive to light.  Neck:     Comments: No midline spinal tenderness. Cardiovascular:     Rate and Rhythm: Normal rate and regular rhythm.     Heart sounds: No murmur heard.      Comments: 2+ symmetric radial and DP pulses. Pulmonary:     Effort:  No respiratory distress.     Breath sounds: Normal breath sounds. No wheezing or rales.  Chest:     Chest wall: No tenderness.  Abdominal:     General: There is no distension.     Palpations: Abdomen is soft.     Tenderness: There is no abdominal tenderness. There is no guarding or rebound.  Musculoskeletal:     Cervical back: Normal range of motion and neck supple. No spinous process tenderness.     Comments: Upper extremities: Patient has an abrasion to the left posterior elbow without active bleeding. She is intact active range of motion throughout. No focal bony tenderness Back: No midline tenderness Lower extremities: Patient has an obvious deformity with shortening and external rotation of the left lower extremity. There are no significant open wounds. She has intact active range of motion throughout the right lower extremity. Left lower extremity range of motion limited secondary to pain. Patient is tender to palpation over the left antero lateral hip in the greater trochanter. Lower extremities are otherwise nontender.  Skin:    General: Skin is warm and dry.     Findings: No rash.  Neurological:     Comments: Alert. Clear speech. Sensation grossly intact bilateral upper and lower extremities. 5 out of 5 symmetric grip strength. 5 out of 5 strength with plantar dorsiflexion bilaterally.  Psychiatric:        Behavior: Behavior normal.    ED Results / Procedures / Treatments   Labs (all labs ordered are listed, but only abnormal results are displayed) Labs Reviewed  CBC WITH DIFFERENTIAL/PLATELET - Abnormal; Notable for the following components:      Result Value   WBC 12.8 (*)    MCV 104.3 (*)    Neutro Abs 8.3 (*)    Abs Immature Granulocytes 0.11 (*)    All other components within normal limits  PROTIME-INR - Abnormal; Notable for the following components:   Prothrombin Time 27.9 (*)    INR 2.7 (*)    All other components within normal limits  BASIC METABOLIC PANEL -  Abnormal; Notable for the following components:   CO2 20 (*)    Glucose, Bld 165 (*)    Calcium 8.8 (*)    GFR calc non Af Amer 55 (*)    All other components within normal limits  I-STAT CHEM 8, ED - Abnormal; Notable for the following components:   Glucose, Bld 162 (*)    Calcium, Ion 1.13 (*)    TCO2 20 (*)    Hemoglobin 16.3 (*)    HCT 48.0 (*)    All other components within normal limits  SARS CORONAVIRUS 2 BY RT PCR (HOSPITAL ORDER, PERFORMED  [facility])  URINALYSIS, ROUTINE W REFLEX MICROSCOPIC    EKG EKG Interpretation  Date/Time:  Sunday July 22 2020 05:06:25 EDT Ventricular Rate:  101 PR Interval:    QRS Duration: 104 QT Interval:  362 QTC Calculation: 470 R Axis:   -8 Text Interpretation: Multifocal atrial tachycardia Ventricular premature complex Abnormal R-wave progression, early transition Confirmed by Dory Horn) on 07/22/2020 5:37:39 AM   Radiology DG Chest Portable 1 View  Result Date: 07/22/2020 CLINICAL DATA:  Pt presents to ED BIB [facility] from home. Pt c/o L hip pain and L elbow pain. Pt reports that she had mechanical trip/fall this morning. Pt states that she fell onto L hip and L elbow. Denies head injury or trauma, denies LOC. Pt is on blood thinners. EXAM: PORTABLE CHEST 1 VIEW COMPARISON:  Chest CT, 01/05/2013 FINDINGS: Cardiac silhouette borderline enlarged. No mediastinal or hilar masses. Chronic interstitial thickening most evident in the bases. Lungs otherwise clear. No convincing pleural effusion and no pneumothorax. Skeletal structures are demineralized but grossly intact. IMPRESSION: No acute cardiopulmonary disease. Electronically Signed   By: Lajean Manes M.D.   On: 07/22/2020 05:49   DG Hip Unilat With Pelvis 2-3 Views Left  Result Date: 07/22/2020 CLINICAL DATA:  Pt presents to ED BIB [facility] from home. Pt c/o L hip pain and L elbow pain. Pt reports that she had mechanical trip/fall this morning. Pt states that she fell  onto L hip and L elbow. Denies head injury or trauma, denies LOC. Pt is on blood thinners. EXAM: DG HIP (WITH OR WITHOUT PELVIS) 2-3V LEFT COMPARISON:  None. FINDINGS: Comminuted, displaced intertrochanteric fracture of the left proximal femur. The femoral neck fracture component is displaced laterally relation to the shaft component, by 1.5 cm. Mild varus angulation. No other fractures.  No bone lesions. Hip joints, SI joints and pubic symphysis are normally aligned. Skeletal structures are demineralized. Soft tissues are unremarkable. IMPRESSION: 1. Comminuted, mildly displaced and varus angulated intertrochanteric fracture of the proximal left femur. No dislocation. Electronically Signed   By: Lajean Manes M.D.   On: 07/22/2020 06:29    Procedures Procedures (including critical care time)  Medications Ordered in ED Medications  Tdap (BOOSTRIX) injection 0.5 mL (0.5 mLs Intramuscular Given 07/22/20 5573)    ED Course  I have reviewed the triage vital signs and the nursing notes.  Pertinent labs & imaging results that were available during my care of the patient were reviewed by me and considered in my medical decision making (see chart for details).  Narcissus Detwiler was evaluated in Emergency Department on 07/22/2020 for the symptoms described in the history of present illness. He/she was evaluated in the context of the global COVID-19 pandemic, which necessitated consideration that the patient might be at risk for infection with the SARS-CoV-2 virus that causes COVID-19. Institutional protocols and algorithms that pertain to the evaluation of patients at risk for COVID-19 are in a state of rapid change based on information released by regulatory bodies including the CDC and federal and state organizations. These policies and algorithms were followed during the patient's care in the ED.    MDM Rules/Calculators/A&P                         Patient presents to the ED with complaints of L hip pain S/p  mechanical fall.  Patient nontoxic, vitals without significant abnormality.  She has no signs of serious head, neck, or back  injury, distally no chest/abdominal tenderness to raise concern for significant intrathoracic/abdominal trauma.  Left elbow abrasion present, she has intact active range of motion, no focal bony tenderness, patient feels she does not need an x-ray at this location, I have a low suspicion for fracture/dislocation here.   Additional history obtained:  Additional history obtained from EMS. Previous records obtained and reviewed.   Lab Tests:  I Ordered, reviewed, and interpreted labs, which included:  CBC: Mild leukocytosis at 12.8.  No anemia. BMP: Hyperglycemia, no significant electrolyte derangement. PT/INR: Elevated, unclear etiology. Urinalysis: Pending Covid testing: Pending  Imaging Studies ordered:  I ordered imaging studies which included CXR & L hip x-ray, I independently visualized and interpreted imaging which showed  Comminuted, mildly displaced and varus angulated intertrochanteric fracture of the proximal left femur. No dislocation  Patient neurovascularly intact distally from fracture site.  On reassessment pain has returned some, will order 50 mcg of fentanyl and discussed with orthopedic surgery as well as the hospital service for admission.  06:45: CONSULT: Discussed with orthopedic surgeon Dr. Lyla Glassing aware patient, hospitalist admission.  Patient care signed out to [facility] PA-C at change of shift pending hospitalist consultation.   Findings and plan of care discussed with supervising physician Dr. Randal Buba who has assessed patient & is in agreement.   Portions of this note were generated with Lobbyist. Dictation errors may occur despite best attempts at proofreading.  Final Clinical Impression(s) / ED Diagnoses Final diagnoses:  Fall, initial encounter  Closed displaced intertrochanteric fracture of left femur, initial  encounter [facility])    Rx / DC Orders ED Discharge Orders    None       Leafy Kindle 07/22/20 [place], April, MD 07/25/20 2303

## 2020-07-22 NOTE — Plan of Care (Signed)
  Problem: Education: Goal: Knowledge of General Education information will improve Description: Including pain rating scale, medication(s)/side effects and non-pharmacologic comfort measures Outcome: Progressing   Problem: Nutrition: Goal: Adequate nutrition will be maintained Outcome: Progressing   Problem: Coping: Goal: Level of anxiety will decrease Outcome: Progressing   Problem: Pain Managment: Goal: General experience of comfort will improve Outcome: Progressing   Problem: Safety: Goal: Ability to remain free from injury will improve Outcome: Progressing   Problem: Skin Integrity: Goal: Risk for impaired skin integrity will decrease Outcome: Progressing   Problem: Education: Goal: Verbalization of understanding the information provided (i.e., activity precautions, restrictions, etc) will improve Outcome: Progressing Goal: Individualized Educational Video(s) Outcome: Progressing   Problem: Pain Management: Goal: Pain level will decrease Outcome: Progressing

## 2020-07-22 NOTE — ED Triage Notes (Signed)
Pt presents to ED BIB [facility] from home. Pt c/o L hip pain and L elbow pain. Pt reports that she had mechanical trip/fall this morning. Pt states that she fell onto L hip and L elbow. Denies head injury or trauma, denies LOC. Pt is on blood thinners.

## 2020-07-22 NOTE — H&P (Signed)
History and Physical:    Stacy Valencia   QQV:[id number] DOB: May 13, 1933 DOA: 07/22/2020  Referring MD/provider: PA Petrocelli PCP: [name], MD (Inactive)   Patient coming from: Home  Chief Complaint: Left hip pain after fall  History of Present Illness:   Stacy Valencia is an 86 y.o. female with PMH significant for history of VTE on Xarelto who presents to ED after fall.  Patient states that she was getting up at 3:30 in the morning last night when she tripped over a scale and fell onto her left hip and had immediate pain.  She was unable to get up.  Patient denies any head trauma or loss of consciousness.  She denies any confusion.  Patient states that when she went to bed last night she felt perfectly normal and fine.  She states she is quite active and continues to bowl.  She denies any recent chest pain, palpitations, dizziness, syncope, presyncope.  Patient is followed by hematology, Dr. Cranston Neighbor at [facility] for her anticoagulation.  Per his note patient had initial PE in 2005 and a recurrent PE in 2014 both of which were provoked by long trips.  The PE in 2014 was a saddle embolus.  She does not appear to have any diagnosis of a known hypercoagulable state or syndrome.  However per Dr. Cranston Neighbor, because she has had 2 separate VTE episodes separated by 9 years he recommended continuing on Xarelto and she has done so.  Patient states she is had no problems with Xarelto and underwent shoulder surgery and gets her teeth cleaned without difficulty.  ED Course:  The patient was noted to be afebrile and normotensive.  Work-up reveals comminuted mildly displaced intertrochanteric fracture of the left femur.  Laboratory data was notable for INR of 2.7.  Patient was seen by orthopedics who are planning on taking to the OR after INR is normalized per patient report.  ROS:   ROS   Review of Systems: General: Denies fever, chills, malaise,  Eyes: Denies recent change in vision, no  discharge, redness, pain noted Endocrine: Denies heat/cold intolerance, polyuria or weight loss. Respiratory: Denies cough, SOB at rest or hemoptysis Cardiovascular: Denies chest pain or palpitations GI: Denies nausea, vomiting, diarrhea or constipation GU: Denies dysuria, frequency or hematuria CNS: Denies HA, dizziness, confusion, new weakness or clumsiness. Mood/affect: Denies anxiety/depression    Past Medical History:   Past Medical History:  Diagnosis Date  . GERD (gastroesophageal reflux disease)   . PE (pulmonary embolism) 2005    Past Surgical History:   Past Surgical History:  Procedure Laterality Date  . ABDOMINAL HYSTERECTOMY    . APPENDECTOMY    . arthroscopic shoulder surgery Right 10/28/2016   decompression of SAD/DCR    Social History:   Social History   Socioeconomic History  . Marital status: Married    Spouse name: Not on file  . Number of children: Not on file  . Years of education: Not on file  . Highest education level: Not on file  Occupational History  . Not on file  Tobacco Use  . Smoking status: Never Smoker  . Smokeless tobacco: Never Used  Substance and Sexual Activity  . Alcohol use: No    Comment: occasionally  . Drug use: No  . Sexual activity: Not on file  Other Topics Concern  . Not on file  Social History Narrative  . Not on file   Social Determinants of Health   Financial Resource Strain:   .  Difficulty of Paying Living Expenses:   Food Insecurity:   . Worried About Charity fundraiser in the Last Year:   . Arboriculturist in the Last Year:   Transportation Needs:   . Film/video editor (Medical):   Marland Kitchen Lack of Transportation (Non-Medical):   Physical Activity:   . Days of Exercise per Week:   . Minutes of Exercise per Session:   Stress:   . Feeling of Stress :   Social Connections:   . Frequency of Communication with Friends and Family:   . Frequency of Social Gatherings with Friends and Family:   . Attends  Religious Services:   . Active Member of Clubs or Organizations:   . Attends Archivist Meetings:   Marland Kitchen Marital Status:   Intimate Partner Violence:   . Fear of Current or Ex-Partner:   . Emotionally Abused:   Marland Kitchen Physically Abused:   . Sexually Abused:     Allergies   Sulfa antibiotics  Family history:   Family History  Problem Relation Age of Onset  . Pulmonary embolism Neg Hx     Current Medications:   Prior to Admission medications   Medication Sig Start Date End Date Taking? Authorizing Provider  alendronate (FOSAMAX) 70 MG tablet Take 70 mg by mouth every Sunday. Take with a full glass of water on an empty stomach. 12/21/16   [provider]  omeprazole (PRILOSEC) 20 MG capsule Take 20 mg by mouth daily.    [provider]  XARELTO 20 MG TABS tablet TAKE ONE TABLET BY MOUTH DAILY 07/17/20   Wyatt Portela, MD    Physical Exam:   Vitals:   07/22/20 0518 07/22/20 0521 07/22/20 0536 07/22/20 0700  BP:    104/68  Pulse: 104 98  100  Resp: 21 14  16   Temp:   97.8 F (36.6 C)   TempSrc:   Oral   SpO2: 95% 94%  92%  Weight:      Height:         Physical Exam: Blood pressure 104/68, pulse 100, temperature 97.8 F (36.6 C), temperature source Oral, resp. rate 16, height 5\' 6"  (1.676 m), weight (!) 95.3 kg, SpO2 92 %. Gen: Well-appearing but tired female lying flat in bed with attentive husband at bedside Eyes: sclera anicteric, conjuctiva mildly injected bilaterally CVS: S1-S2, regulary, no gallops Respiratory:  decreased air entry likely secondary to decreased inspiratory effort GI: Obese, NABS, soft, NT  LE: No edema. No cyanosis, extremities warm with good cap refill. Neuro: A/O x 3,  grossly nonfocal.  Psych: patient is logical and coherent, judgement and insight appear normal, mood and affect appropriate to situation.   Data Review:    Labs: Basic Metabolic Panel: Recent Labs  Lab 07/22/20 0516 07/22/20 0525  NA 137 141    K 3.9 3.9  CL 106 106  CO2 20*  --   GLUCOSE 165* 162*  BUN 12 13  CREATININE 0.95 0.90  CALCIUM 8.8*  --    Liver Function Tests: No results for input(s): AST, ALT, ALKPHOS, BILITOT, PROT, ALBUMIN in the last 168 hours. No results for input(s): LIPASE, AMYLASE in the last 168 hours. No results for input(s): AMMONIA in the last 168 hours. CBC: Recent Labs  Lab 07/22/20 0516 07/22/20 0525  WBC 12.8*  --   NEUTROABS 8.3*  --   HGB 14.1 16.3*  HCT 43.8 48.0*  MCV 104.3*  --   PLT 211  --  Cardiac Enzymes: No results for input(s): CKTOTAL, CKMB, CKMBINDEX, TROPONINI in the last 168 hours.  BNP (last 3 results) No results for input(s): PROBNP in the last 8760 hours. CBG: No results for input(s): GLUCAP in the last 168 hours.  Urinalysis No results found for: COLORURINE, APPEARANCEUR, LABSPEC, PHURINE, GLUCOSEU, HGBUR, BILIRUBINUR, KETONESUR, PROTEINUR, UROBILINOGEN, NITRITE, LEUKOCYTESUR    Radiographic Studies: DG Chest Portable 1 View  Result Date: 07/22/2020 CLINICAL DATA:  Pt presents to ED BIB [facility] from home. Pt c/o L hip pain and L elbow pain. Pt reports that she had mechanical trip/fall this morning. Pt states that she fell onto L hip and L elbow. Denies head injury or trauma, denies LOC. Pt is on blood thinners. EXAM: PORTABLE CHEST 1 VIEW COMPARISON:  Chest CT, 01/05/2013 FINDINGS: Cardiac silhouette borderline enlarged. No mediastinal or hilar masses. Chronic interstitial thickening most evident in the bases. Lungs otherwise clear. No convincing pleural effusion and no pneumothorax. Skeletal structures are demineralized but grossly intact. IMPRESSION: No acute cardiopulmonary disease. Electronically Signed   By: Lajean Manes M.D.   On: 07/22/2020 05:49   DG Hip Unilat With Pelvis 2-3 Views Left  Result Date: 07/22/2020 CLINICAL DATA:  Pt presents to ED BIB [facility] from home. Pt c/o L hip pain and L elbow pain. Pt reports that she had mechanical trip/fall this  morning. Pt states that she fell onto L hip and L elbow. Denies head injury or trauma, denies LOC. Pt is on blood thinners. EXAM: DG HIP (WITH OR WITHOUT PELVIS) 2-3V LEFT COMPARISON:  None. FINDINGS: Comminuted, displaced intertrochanteric fracture of the left proximal femur. The femoral neck fracture component is displaced laterally relation to the shaft component, by 1.5 cm. Mild varus angulation. No other fractures.  No bone lesions. Hip joints, SI joints and pubic symphysis are normally aligned. Skeletal structures are demineralized. Soft tissues are unremarkable. IMPRESSION: 1. Comminuted, mildly displaced and varus angulated intertrochanteric fracture of the proximal left femur. No dislocation. Electronically Signed   By: Lajean Manes M.D.   On: 07/22/2020 06:29    EKG: Independently reviewed.  Irregularly irregular narrow complex rhythm at 100.  This is read as multifocal atrial tachycardia by the computer but I am unable to rule out atrial fibrillation given extremely poor baseline.  Diffusely flattened T waves.  No acute ST-T wave changes.   Assessment/Plan:   Principal Problem:   Hip fracture (HCC) Active Problems:   GERD (gastroesophageal reflux disease)   History of adverse effect of venous thromboembolism (VTE) prophylaxis  Hip fracture Patient admitted to hospital service per hip fracture protocol Further management including VTE prophylaxis and pain management per orthopedic PT OT when appropriate   Anticoagulated/history of VTE/PE x2 Xarelto being held pending surgery She also should be restarted as soon as safe after surgery   Abnormal rhythm on EKG Irregularly irregular rhythm seen on EKG, I am not entirely clear if this is A. fib or multifocal atrial tachycardia I looked at the telemetry in the ED and I am still not sure what the rhythm is Will place patient on telemetry Repeat EKG, hopefully with a better baseline We will curbside cardiology for their  input--discussed with cardiology, they believe this is MAT  GERD Continue PPI   Other information:   DVT prophylaxis: SCD ordered. Code Status: Full Family Communication: Patient's husband was at bedside throughout Disposition Plan: TBD Consults called: Orthopedics Admission status: Inpatient  Rebakah Cokley Tublu Adil Tugwell [facility]  If 7PM-7AM, please contact night-coverage [email or web address] Password  [email or web address] 07/22/2020, 8:09 AM

## 2020-07-23 ENCOUNTER — Inpatient Hospital Stay (HOSPITAL_COMMUNITY): Payer: Medicare Other

## 2020-07-23 ENCOUNTER — Inpatient Hospital Stay (HOSPITAL_COMMUNITY): Payer: Medicare Other | Admitting: Anesthesiology

## 2020-07-23 ENCOUNTER — Encounter (HOSPITAL_COMMUNITY)
Admission: EM | Disposition: A | Discharge status: Rehabilitation Facility | Payer: Self-pay | Source: Home / Self Care | Attending: Family Medicine

## 2020-07-23 ENCOUNTER — Encounter (HOSPITAL_COMMUNITY): Payer: Self-pay | Admitting: Internal Medicine

## 2020-07-23 DIAGNOSIS — K2100 Gastro-esophageal reflux disease with esophagitis, without bleeding: Secondary | ICD-10-CM

## 2020-07-23 HISTORY — PX: INTRAMEDULLARY (IM) NAIL INTERTROCHANTERIC: SHX5875

## 2020-07-23 LAB — CBC WITH DIFFERENTIAL/PLATELET
Abs Immature Granulocytes: 0.13 10*3/uL — ABNORMAL HIGH (ref 0.00–0.07)
Basophils Absolute: 0.1 10*3/uL (ref 0.0–0.1)
Basophils Relative: 0 %
Eosinophils Absolute: 0.1 10*3/uL (ref 0.0–0.5)
Eosinophils Relative: 0 %
HCT: 35.1 % — ABNORMAL LOW (ref 36.0–46.0)
Hemoglobin: 11.1 g/dL — ABNORMAL LOW (ref 12.0–15.0)
Immature Granulocytes: 1 %
Lymphocytes Relative: 6 %
Lymphs Abs: 1.1 10*3/uL (ref 0.7–4.0)
MCH: 33.5 pg (ref 26.0–34.0)
MCHC: 31.6 g/dL (ref 30.0–36.0)
MCV: 106 fL — ABNORMAL HIGH (ref 80.0–100.0)
Monocytes Absolute: 0.9 10*3/uL (ref 0.1–1.0)
Monocytes Relative: 5 %
Neutro Abs: 16.5 10*3/uL — ABNORMAL HIGH (ref 1.7–7.7)
Neutrophils Relative %: 88 %
Platelets: 168 10*3/uL (ref 150–400)
RBC: 3.31 MIL/uL — ABNORMAL LOW (ref 3.87–5.11)
RDW: 14.7 % (ref 11.5–15.5)
WBC: 18.7 10*3/uL — ABNORMAL HIGH (ref 4.0–10.5)
nRBC: 0 % (ref 0.0–0.2)

## 2020-07-23 LAB — MAGNESIUM: Magnesium: 1.6 mg/dL — ABNORMAL LOW (ref 1.7–2.4)

## 2020-07-23 LAB — BASIC METABOLIC PANEL
Anion gap: 7 (ref 5–15)
Anion gap: 11 (ref 5–15)
BUN: 17 mg/dL (ref 8–23)
BUN: 19 mg/dL (ref 8–23)
CO2: 26 mmol/L (ref 22–32)
CO2: 22 mmol/L (ref 22–32)
Calcium: 8.9 mg/dL (ref 8.9–10.3)
Calcium: 8.4 mg/dL — ABNORMAL LOW (ref 8.9–10.3)
Chloride: 106 mmol/L (ref 98–111)
Chloride: 102 mmol/L (ref 98–111)
Creatinine, Ser: 1.28 mg/dL — ABNORMAL HIGH (ref 0.44–1.00)
Creatinine, Ser: 1.59 mg/dL — ABNORMAL HIGH (ref 0.44–1.00)
GFR calc Af Amer: 44 mL/min — ABNORMAL LOW (ref >60)
GFR calc Af Amer: 34 mL/min — ABNORMAL LOW (ref >60)
GFR calc non Af Amer: 38 mL/min — ABNORMAL LOW (ref >60)
GFR calc non Af Amer: 29 mL/min — ABNORMAL LOW (ref >60)
Glucose, Bld: 157 mg/dL — ABNORMAL HIGH (ref 70–99)
Glucose, Bld: 205 mg/dL — ABNORMAL HIGH (ref 70–99)
Potassium: 5.1 mmol/L (ref 3.5–5.1)
Potassium: 5.4 mmol/L — ABNORMAL HIGH (ref 3.5–5.1)
Sodium: 139 mmol/L (ref 135–145)
Sodium: 135 mmol/L (ref 135–145)

## 2020-07-23 LAB — SURGICAL PCR SCREEN
MRSA, PCR: NEGATIVE
Staphylococcus aureus: NEGATIVE

## 2020-07-23 LAB — CBC
HCT: 39.8 % (ref 36.0–46.0)
Hemoglobin: 12.7 g/dL (ref 12.0–15.0)
MCH: 33.7 pg (ref 26.0–34.0)
MCHC: 31.9 g/dL (ref 30.0–36.0)
MCV: 105.6 fL — ABNORMAL HIGH (ref 80.0–100.0)
Platelets: 191 10*3/uL (ref 150–400)
RBC: 3.77 MIL/uL — ABNORMAL LOW (ref 3.87–5.11)
RDW: 14.6 % (ref 11.5–15.5)
WBC: 13.8 10*3/uL — ABNORMAL HIGH (ref 4.0–10.5)
nRBC: 0 % (ref 0.0–0.2)

## 2020-07-23 LAB — HEMOGLOBIN A1C
Hgb A1c MFr Bld: 5.5 % (ref 4.8–5.6)
Mean Plasma Glucose: 111.15 mg/dL

## 2020-07-23 SURGERY — FIXATION, FRACTURE, INTERTROCHANTERIC, WITH INTRAMEDULLARY ROD
Anesthesia: General | Laterality: Left

## 2020-07-23 MED ORDER — LIDOCAINE 2% (20 MG/ML) 5 ML SYRINGE
INTRAMUSCULAR | Status: DC | PRN
  Administered 2020-07-23: 100 mg via INTRAVENOUS

## 2020-07-23 MED ORDER — LACTATED RINGERS IV SOLN: INTRAVENOUS | Status: DC | PRN

## 2020-07-23 MED ORDER — PROMETHAZINE HCL 25 MG/ML IJ SOLN: 6.2500 mg | INTRAMUSCULAR | Status: DC | PRN

## 2020-07-23 MED ORDER — ACETAMINOPHEN 500 MG PO TABS: 1000.0000 mg | ORAL_TABLET | Freq: Once | ORAL | Status: DC

## 2020-07-23 MED ORDER — SUGAMMADEX SODIUM 200 MG/2ML IV SOLN
INTRAVENOUS | Status: DC | PRN
  Administered 2020-07-23: 175 mg via INTRAVENOUS

## 2020-07-23 MED ORDER — LIDOCAINE 2% (20 MG/ML) 5 ML SYRINGE
INTRAMUSCULAR | Status: AC
  Filled 2020-07-23: qty 5

## 2020-07-23 MED ORDER — 0.9 % SODIUM CHLORIDE (POUR BTL) OPTIME
TOPICAL | Status: DC | PRN
  Administered 2020-07-23: 1000 mL

## 2020-07-23 MED ORDER — PHENYLEPHRINE HCL-NACL 10-0.9 MG/250ML-% IV SOLN
INTRAVENOUS | Status: DC | PRN
Start: 2020-07-23 — End: 2020-07-23
  Administered 2020-07-23: 25 ug/min via INTRAVENOUS

## 2020-07-23 MED ORDER — PROPOFOL 10 MG/ML IV BOLUS
INTRAVENOUS | Status: AC
  Filled 2020-07-23: qty 20

## 2020-07-23 MED ORDER — METOCLOPRAMIDE HCL 5 MG PO TABS: 5.0000 mg | ORAL_TABLET | Freq: Three times a day (TID) | ORAL | Status: DC | PRN

## 2020-07-23 MED ORDER — ROCURONIUM BROMIDE 10 MG/ML (PF) SYRINGE
PREFILLED_SYRINGE | INTRAVENOUS | Status: AC
  Filled 2020-07-23: qty 10

## 2020-07-23 MED ORDER — FENTANYL CITRATE (PF) 250 MCG/5ML IJ SOLN
INTRAMUSCULAR | Status: AC
  Filled 2020-07-23: qty 5

## 2020-07-23 MED ORDER — EPHEDRINE SULFATE-NACL 50-0.9 MG/10ML-% IV SOSY
PREFILLED_SYRINGE | INTRAVENOUS | Status: DC | PRN
  Administered 2020-07-23: 15 mg via INTRAVENOUS

## 2020-07-23 MED ORDER — TRANEXAMIC ACID-NACL 1000-0.7 MG/100ML-% IV SOLN
INTRAVENOUS | Status: AC
  Filled 2020-07-23: qty 100

## 2020-07-23 MED ORDER — OXYCODONE HCL 5 MG/5ML PO SOLN: 5.0000 mg | Freq: Once | ORAL | Status: DC | PRN

## 2020-07-23 MED ORDER — PHENYLEPHRINE 40 MCG/ML (10ML) SYRINGE FOR IV PUSH (FOR BLOOD PRESSURE SUPPORT)
PREFILLED_SYRINGE | INTRAVENOUS | Status: DC | PRN
  Administered 2020-07-23 (×2): 120 ug via INTRAVENOUS
  Administered 2020-07-23: 40 ug via INTRAVENOUS
  Administered 2020-07-23: 120 ug via INTRAVENOUS

## 2020-07-23 MED ORDER — EPHEDRINE 5 MG/ML INJ
INTRAVENOUS | Status: AC
  Filled 2020-07-23: qty 10

## 2020-07-23 MED ORDER — DEXAMETHASONE SODIUM PHOSPHATE 10 MG/ML IJ SOLN
INTRAMUSCULAR | Status: AC
  Filled 2020-07-23: qty 1

## 2020-07-23 MED ORDER — MENTHOL 3 MG MT LOZG: 1.0000 | LOZENGE | OROMUCOSAL | Status: DC | PRN

## 2020-07-23 MED ORDER — CHLORHEXIDINE GLUCONATE CLOTH 2 % EX PADS
6.0000 | MEDICATED_PAD | Freq: Every day | CUTANEOUS | Status: DC
  Administered 2020-07-23 – 2020-07-25 (×3): 6 via TOPICAL

## 2020-07-23 MED ORDER — OXYCODONE HCL 5 MG PO TABS: 5.0000 mg | ORAL_TABLET | Freq: Once | ORAL | Status: DC | PRN

## 2020-07-23 MED ORDER — LACTATED RINGERS IV SOLN: INTRAVENOUS | Status: AC

## 2020-07-23 MED ORDER — ROCURONIUM BROMIDE 10 MG/ML (PF) SYRINGE
PREFILLED_SYRINGE | INTRAVENOUS | Status: DC | PRN
  Administered 2020-07-23: 60 mg via INTRAVENOUS

## 2020-07-23 MED ORDER — PHENOL 1.4 % MT LIQD: 1.0000 | OROMUCOSAL | Status: DC | PRN

## 2020-07-23 MED ORDER — HYDROMORPHONE HCL 1 MG/ML IJ SOLN: 0.5000 mg | INTRAMUSCULAR | Status: DC | PRN

## 2020-07-23 MED ORDER — RIVAROXABAN 10 MG PO TABS
10.0000 mg | ORAL_TABLET | Freq: Every day | ORAL | Status: DC
  Administered 2020-07-24: 10 mg via ORAL
  Filled 2020-07-23: qty 1

## 2020-07-23 MED ORDER — ONDANSETRON HCL 4 MG/2ML IJ SOLN
INTRAMUSCULAR | Status: AC
  Filled 2020-07-23: qty 2

## 2020-07-23 MED ORDER — ONDANSETRON HCL 4 MG/2ML IJ SOLN: 4.0000 mg | Freq: Four times a day (QID) | INTRAMUSCULAR | Status: DC | PRN

## 2020-07-23 MED ORDER — METOPROLOL TARTRATE 5 MG/5ML IV SOLN
2.5000 mg | Freq: Once | INTRAVENOUS | Status: AC
  Administered 2020-07-23: 2.5 mg via INTRAVENOUS
  Filled 2020-07-23: qty 5

## 2020-07-23 MED ORDER — CEFAZOLIN SODIUM-DEXTROSE 2-3 GM-%(50ML) IV SOLR
INTRAVENOUS | Status: DC | PRN
Start: 2020-07-23 — End: 2020-07-23
  Administered 2020-07-23: 2 g via INTRAVENOUS

## 2020-07-23 MED ORDER — PROPOFOL 10 MG/ML IV BOLUS
INTRAVENOUS | Status: DC | PRN
  Administered 2020-07-23: 150 mg via INTRAVENOUS

## 2020-07-23 MED ORDER — FENTANYL CITRATE (PF) 100 MCG/2ML IJ SOLN: 25.0000 ug | INTRAMUSCULAR | Status: DC | PRN

## 2020-07-23 MED ORDER — METOCLOPRAMIDE HCL 5 MG/ML IJ SOLN: 5.0000 mg | Freq: Three times a day (TID) | INTRAMUSCULAR | Status: DC | PRN

## 2020-07-23 MED ORDER — DEXAMETHASONE SODIUM PHOSPHATE 4 MG/ML IJ SOLN
INTRAMUSCULAR | Status: DC | PRN
  Administered 2020-07-23: 4 mg via INTRAVENOUS

## 2020-07-23 MED ORDER — PHENYLEPHRINE 40 MCG/ML (10ML) SYRINGE FOR IV PUSH (FOR BLOOD PRESSURE SUPPORT)
PREFILLED_SYRINGE | INTRAVENOUS | Status: AC
  Filled 2020-07-23: qty 10

## 2020-07-23 MED ORDER — ONDANSETRON HCL 4 MG PO TABS: 4.0000 mg | ORAL_TABLET | Freq: Four times a day (QID) | ORAL | Status: DC | PRN

## 2020-07-23 MED ORDER — DOCUSATE SODIUM 100 MG PO CAPS
100.0000 mg | ORAL_CAPSULE | Freq: Two times a day (BID) | ORAL | Status: DC
  Administered 2020-07-23 – 2020-07-25 (×4): 100 mg via ORAL
  Filled 2020-07-23 (×4): qty 1

## 2020-07-23 MED ORDER — ONDANSETRON HCL 4 MG/2ML IJ SOLN
INTRAMUSCULAR | Status: DC | PRN
  Administered 2020-07-23: 4 mg via INTRAVENOUS

## 2020-07-23 SURGICAL SUPPLY — 59 items
ADH SKN CLS APL DERMABOND .7 (GAUZE/BANDAGES/DRESSINGS) ×1
ALCOHOL 70% 16 OZ (MISCELLANEOUS) ×3 IMPLANT
APL PRP STRL LF DISP 70% ISPRP (MISCELLANEOUS) ×1
BIT DRILL CANN LG 4.3MM (BIT) IMPLANT
BIT DRILL LAG SCREW (DRILL) IMPLANT
BNDG COHESIVE 4X5 TAN STRL (GAUZE/BANDAGES/DRESSINGS) IMPLANT
CHLORAPREP W/TINT 26 (MISCELLANEOUS) ×3 IMPLANT
COVER PERINEAL POST (MISCELLANEOUS) ×3 IMPLANT
COVER SURGICAL LIGHT HANDLE (MISCELLANEOUS) ×3 IMPLANT
COVER WAND RF STERILE (DRAPES) ×3 IMPLANT
DERMABOND ADVANCED (GAUZE/BANDAGES/DRESSINGS) ×2
DERMABOND ADVANCED .7 DNX12 (GAUZE/BANDAGES/DRESSINGS) ×2 IMPLANT
DRAPE C-ARM 42X72 X-RAY (DRAPES) ×3 IMPLANT
DRAPE C-ARMOR (DRAPES) ×3 IMPLANT
DRAPE HALF SHEET 40X57 (DRAPES) ×3 IMPLANT
DRAPE IMP U-DRAPE 54X76 (DRAPES) ×6 IMPLANT
DRAPE STERI IOBAN 125X83 (DRAPES) ×3 IMPLANT
DRAPE U-SHAPE 47X51 STRL (DRAPES) ×6 IMPLANT
DRILL BIT CANN LG 4.3MM (BIT) ×3
DRILL LAG SCREW (DRILL) ×3
DRSG MEPILEX BORDER 4X4 (GAUZE/BANDAGES/DRESSINGS) ×4 IMPLANT
DRSG MEPILEX BORDER 4X8 (GAUZE/BANDAGES/DRESSINGS) ×4 IMPLANT
DRSG PAD ABDOMINAL 8X10 ST (GAUZE/BANDAGES/DRESSINGS) IMPLANT
ELECT REM PT RETURN 9FT ADLT (ELECTROSURGICAL) ×3
ELECTRODE REM PT RTRN 9FT ADLT (ELECTROSURGICAL) ×1 IMPLANT
FACESHIELD WRAPAROUND (MASK) ×6 IMPLANT
FACESHIELD WRAPAROUND OR TEAM (MASK) ×1 IMPLANT
GLOVE BIO SURGEON STRL SZ8.5 (GLOVE) ×6 IMPLANT
GLOVE BIOGEL PI IND STRL 8.5 (GLOVE) ×1 IMPLANT
GLOVE BIOGEL PI INDICATOR 8.5 (GLOVE) ×2
GOWN STRL REUS W/ TWL LRG LVL3 (GOWN DISPOSABLE) ×2 IMPLANT
GOWN STRL REUS W/TWL 2XL LVL3 (GOWN DISPOSABLE) ×3 IMPLANT
GOWN STRL REUS W/TWL LRG LVL3 (GOWN DISPOSABLE) ×6
GUIDEPIN 3.2X17.5 THRD DISP (PIN) ×4 IMPLANT
GUIDEWIRE BALL NOSE 100CM (WIRE) ×2 IMPLANT
HFN 125 DEG 11MM X 180MM (Orthopedic Implant) ×2 IMPLANT
KIT TURNOVER KIT B (KITS) ×3 IMPLANT
MANIFOLD NEPTUNE II (INSTRUMENTS) ×3 IMPLANT
MARKER SKIN DUAL TIP RULER LAB (MISCELLANEOUS) ×3 IMPLANT
NS IRRIG 1000ML POUR BTL (IV SOLUTION) ×3 IMPLANT
PACK GENERAL/GYN (CUSTOM PROCEDURE TRAY) ×3 IMPLANT
PACK UNIVERSAL I (CUSTOM PROCEDURE TRAY) ×3 IMPLANT
PAD ARMBOARD 7.5X6 YLW CONV (MISCELLANEOUS) ×6 IMPLANT
PADDING CAST ABS 4INX4YD NS (CAST SUPPLIES)
PADDING CAST ABS COTTON 4X4 ST (CAST SUPPLIES) IMPLANT
SCREW BONE CORTICAL 5.0X36 (Screw) ×2 IMPLANT
SCREW LAG HIP NAIL 10.5X95 (Screw) ×2 IMPLANT
STAPLER VISISTAT 35W (STAPLE) ×2 IMPLANT
SUT MNCRL AB 3-0 PS2 27 (SUTURE) ×5 IMPLANT
SUT MON AB 2-0 CT1 27 (SUTURE) ×3 IMPLANT
SUT MON AB 2-0 CT1 36 (SUTURE) ×4 IMPLANT
SUT PDS AB 2-0 CT1 27 (SUTURE) ×2 IMPLANT
SUT VIC AB 1 CT1 27 (SUTURE) ×6
SUT VIC AB 1 CT1 27XBRD ANBCTR (SUTURE) ×1 IMPLANT
SUT VICRYL 0 UR6 27IN ABS (SUTURE) ×2 IMPLANT
SYR BULB EAR ULCER 3OZ GRN STR (SYRINGE) ×2 IMPLANT
TOWEL GREEN STERILE (TOWEL DISPOSABLE) ×3 IMPLANT
TOWEL GREEN STERILE FF (TOWEL DISPOSABLE) ×3 IMPLANT
TRAY FOLEY MTR SLVR 14FR STAT (SET/KITS/TRAYS/PACK) ×2 IMPLANT

## 2020-07-23 NOTE — Anesthesia Procedure Notes (Signed)
Procedure Name: Intubation Date/Time: 07/23/2020 12:15 PM Performed by: Jenne Campus, CRNA Pre-anesthesia Checklist: Patient identified, Emergency Drugs available, Suction available and Patient being monitored Patient Re-evaluated:Patient Re-evaluated prior to induction Oxygen Delivery Method: Circle System Utilized Preoxygenation: Pre-oxygenation with 100% oxygen Induction Type: IV induction Ventilation: Mask ventilation without difficulty Laryngoscope Size: Miller and 2 Grade View: Grade I Tube type: Oral Tube size: 7.0 mm Number of attempts: 1 Airway Equipment and Method: Stylet Placement Confirmation: ETT inserted through vocal cords under direct vision,  positive ETCO2 and breath sounds checked- equal and bilateral Secured at: 21 cm Tube secured with: Tape Dental Injury: Teeth and Oropharynx as per pre-operative assessment

## 2020-07-23 NOTE — Anesthesia Preprocedure Evaluation (Addendum)
Anesthesia Evaluation  Patient identified by MRN, date of birth, ID band Patient awake and Patient confused    Reviewed: Allergy & Precautions, NPO status , Patient's Chart, lab work & pertinent test results  History of Anesthesia Complications Negative for: history of anesthetic complications  Airway Mallampati: II  TM Distance: >3 FB Neck ROM: Full    Dental no notable dental hx.    Pulmonary PE (2005/2014, on Xarelto (last dose 07/21/20) )   Pulmonary exam normal        Cardiovascular negative cardio ROS Normal cardiovascular exam     Neuro/Psych negative neurological ROS  negative psych ROS   GI/Hepatic Neg liver ROS, GERD  Medicated and Controlled,  Endo/Other  negative endocrine ROS  Renal/GU ARFRenal disease (Cr 1.28)  negative genitourinary   Musculoskeletal negative musculoskeletal ROS (+)   Abdominal   Peds  Hematology negative hematology ROS (+)   Anesthesia Other Findings Day of surgery medications reviewed with patient.  Reproductive/Obstetrics negative OB ROS                            Anesthesia Physical Anesthesia Plan  ASA: III  Anesthesia Plan: General   Post-op Pain Management:    Induction: Intravenous  PONV Risk Score and Plan: 4 or greater and Treatment may vary due to age or medical condition, Ondansetron and Dexamethasone  Airway Management Planned: Oral ETT  Additional Equipment: None  Intra-op Plan:   Post-operative Plan: Extubation in OR  Informed Consent: I have reviewed the patients History and Physical, chart, labs and discussed the procedure including the risks, benefits and alternatives for the proposed anesthesia with the patient or authorized representative who has indicated his/her understanding and acceptance.     Dental advisory given and Consent reviewed with POA  Plan Discussed with: CRNA  Anesthesia Plan Comments: (Patient arrived  from floor to preop bay with initial SpO2 60s, sleepy but arousable to voice, RR around 12. Patient had just received dilaudid 2mg  IV bolus prior to transfer. Face mask placed with improvement of SpO2 to mid-90s. Patient oriented to person only, husband brought to bedside who states she was oriented x4 prior to last dose of pain medication. Will continue to monitor patient and communicate patient status to floor RN and admitting MD so patient can have pain medication orders adjusted postoperatively. Continuous pulse oximetry ordered. Daiva Huge, MD)      Anesthesia Quick Evaluation

## 2020-07-23 NOTE — Discharge Instructions (Signed)
 Dr. Cletus Paris Adult Hip [facility] Knee Specialist [facility] [address] [place], [place] [address] [phone]   POSTOPERATIVE DIRECTIONS    Hip Rehabilitation, Guidelines Following Surgery   WEIGHT BEARING Weight bearing as tolerated with assist device (walker, cane, etc) as directed, use it as long as suggested by your surgeon or therapist, typically at least 4-6 weeks.   HOME CARE INSTRUCTIONS  Remove items at home which could result in a fall. This includes throw rugs or furniture in walking pathways.  Continue medications as instructed at time of discharge.  You may have some home medications which will be placed on hold until you complete the course of blood thinner medication.  4 days after discharge, you may start showering. No tub baths or soaking your incisions. Do not put on socks or shoes without following the instructions of your caregivers.   Sit on chairs with arms. Use the chair arms to help push yourself up when arising.  Arrange for the use of a toilet seat elevator so you are not sitting low.   Walk with walker as instructed.  You may resume a sexual relationship in one month or when given the OK by your caregiver.  Use walker as long as suggested by your caregivers.  Avoid periods of inactivity such as sitting longer than an hour when not asleep. This helps prevent blood clots.  You may return to work once you are cleared by your surgeon.  Do not drive a car for 6 weeks or until released by your surgeon.  Do not drive while taking narcotics.  Wear elastic stockings for two weeks following surgery during the day but you may remove then at night.  Make sure you keep all of your appointments after your operation with all of your doctors and caregivers. You should call the office at the above phone number and make an appointment for approximately two weeks after the date of your surgery. Please pick up a stool softener and laxative  for home use as long as you are requiring pain medications.  ICE to the affected hip every three hours for 30 minutes at a time and then as needed for pain and swelling. Continue to use ice on the hip for pain and swelling from surgery. You may notice swelling that will progress down to the foot and ankle.  This is normal after surgery.  Elevate the leg when you are not up walking on it.   It is important for you to complete the blood thinner medication as prescribed by your doctor.  Continue to use the breathing machine which will help keep your temperature down.  It is common for your temperature to cycle up and down following surgery, especially at night when you are not up moving around and exerting yourself.  The breathing machine keeps your lungs expanded and your temperature down.  RANGE OF MOTION AND STRENGTHENING EXERCISES  These exercises are designed to help you keep full movement of your hip joint. Follow your caregiver's or physical therapist's instructions. Perform all exercises about fifteen times, three times per day or as directed. Exercise both hips, even if you have had only one joint replacement. These exercises can be done on a training (exercise) mat, on the floor, on a table or on a bed. Use whatever works the best and is most comfortable for you. Use music or television while you are exercising so that the exercises are a pleasant break in your day. This   will make your life better with the exercises acting as a break in routine you can look forward to.  Lying on your back, slowly slide your foot toward your buttocks, raising your knee up off the floor. Then slowly slide your foot back down until your leg is straight again.  Lying on your back spread your legs as far apart as you can without causing discomfort.  Lying on your side, raise your upper leg and foot straight up from the floor as far as is comfortable. Slowly lower the leg and repeat.  Lying on your back, tighten up the  muscle in the front of your thigh (quadriceps muscles). You can do this by keeping your leg straight and trying to raise your heel off the floor. This helps strengthen the largest muscle supporting your knee.  Lying on your back, tighten up the muscles of your buttocks both with the legs straight and with the knee bent at a comfortable angle while keeping your heel on the floor.   SKILLED REHAB INSTRUCTIONS: If the patient is transferred to a skilled rehab facility following release from the hospital, a list of the current medications will be sent to the facility for the patient to continue.  When discharged from the skilled rehab facility, please have the facility set up the patient's Home Health Physical Therapy prior to being released. Also, the skilled facility will be responsible for providing the patient with their medications at time of release from the facility to include their pain medication and their blood thinner medication. If the patient is still at the rehab facility at time of the two week follow up appointment, the skilled rehab facility will also need to assist the patient in arranging follow up appointment in our office and any transportation needs.  MAKE SURE YOU:  Understand these instructions.  Will watch your condition.  Will get help right away if you are not doing well or get worse.  Pick up stool softner and laxative for home use following surgery while on pain medications. Daily dry dressing changes as needed. In 4 days, you may remove your dressings and begin taking showers - no tub baths or soaking the incisions. Continue to use ice for pain and swelling after surgery. Do not use any lotions or creams on the incision until instructed by your surgeon.   

## 2020-07-23 NOTE — Progress Notes (Signed)
   07/23/20 0927  Assess: MEWS Score  Temp 98 F (36.7 C)  BP (!) 143/60  Pulse Rate (!) 127  Resp 18  Level of Consciousness Alert  SpO2 90 %  O2 Device Room Air  Assess: MEWS Score  MEWS Temp 0  MEWS Systolic 0  MEWS Pulse 2  MEWS RR 0  MEWS LOC 0  MEWS Score 2  MEWS Score Color Yellow  Assess: if the MEWS score is Yellow or Red  Were vital signs taken at a resting state? Yes  Focused Assessment No change from prior assessment  Early Detection of Sepsis Score *See Row Information* Low  MEWS guidelines implemented *See Row Information* No, altered LOC is baseline  Treat  Pain Scale 0-10  Pain Score 7  Pain Type Acute pain  Pain Location Hip  Pain Orientation Left  Pain Intervention(s) Medication (See eMAR)  Take Vital Signs  Increase Vital Sign Frequency  Yellow: Q 2hr X 2 then Q 4hr X 2, if remains yellow, continue Q 4hrs (pt going to OR)  Escalate  MEWS: Escalate Yellow: discuss with charge nurse/RN and consider discussing with provider and RRT  Notify: Charge Nurse/RN  Name of Charge Nurse/RN Notified Ivin Booty Bridie Colquhoun  (PT leaving for OR )  Date Charge Nurse/RN Notified 07/23/20  Time Charge Nurse/RN Notified [id number]  Notify: Provider  Provider Name/Title  (MD aware leaving for OR)  Notification Reason Requested by patient/family (n/a)  Response Other (Comment) (n/a)  Notify: Rapid Response  Name of Rapid Response RN Notified n/a  Document  Patient Outcome Other (Comment) (pt going to OR )  Progress note created (see row info) Yes   Pt HR elevated due to pain. Pt going to OR and will reassess upon return to the dept.

## 2020-07-23 NOTE — Progress Notes (Signed)
Mews score a 2 due to increase HR. Patient was turned due to linen change which caused increased pain. No change in LOC. Patient scheduled for surgery today around noon.  Will medicate for pain.

## 2020-07-23 NOTE — Progress Notes (Signed)
PROGRESS NOTE    Patient: Stacy Valencia                            PCP: [name], MD (Inactive)                    DOB: 1933-10-25            DOA: 07/22/2020 [id number]             DOS: 07/23/2020, 1:48 PM   LOS: 1 day   Date of Service: The patient was seen and examined on 07/23/2020  Subjective:   The patient was seen and examined this Am. N.p.o. after midnight in anticipation of ORIF of left hip Patient was given 2 mg of Dilaudid this morning due to pain. Preoperatively patient was noted to be hypoxic satting 64%, was placed on 4 L of oxygen, which improved to 94%.   Brief Narrative:  Per HPI: Kennedey Digilio a 86 y.o.femalewith a history of pulmonary embolism on Xarelto who presents to the ED with complaints of left hip pain status post mechanical fall just prior to arrival.  Patient states that she was walking to the bathroom when she tripped over a scale leading to a fall onto her left hip.  Work-up reveals comminuted mildly displaced intertrochanteric fracture of the left femur. Ortho was consulted, Xarelto was on hold--in anticipation of ORIF   Assessment & Plan:   Principal Problem:   Hip fracture (HCC) Active Problems:   GERD (gastroesophageal reflux disease)   History of adverse effect of venous thromboembolism (VTE) prophylaxis  Hypoxia/  -Brief episode of hypoxia, patient was given 2 mg of Dilaudid for pain control this morning. -Upon evaluation anesthesia to be taken to the OR patient was satting?  64% on room air, was placed on 4 L of oxygen which O2 sat improved to 94%. -We will monitor vitals, O2 sat close -We will modify pain medication -Currently no respiratory distress -mild tachycardia post op . We will monitor, EKG,   Left hip fracture comminuted mildly displaced intertrochanteric fracture of the left femur. -Orthopedic team following - ORIF 07/23/2020 -has been N.p.o. after midnight  Further management including VTE prophylaxis and pain  management per orthopedic PT OT when appropriate   Anticoagulated/history of VTE/PE x2 Xarelto being held pending surgery She also should be restarted as soon as safe after surgery   Abnormal rhythm on EKG -Denies any chest pain, troponin negative -Brief arrhythmia questionable A. fib -We will continue telemetry monitor Repeat EKG, hopefully with a better baseline We will curbside cardiology for their input--discussed with cardiology, they believe this is MAT  GERD Continue PPI     Biotics Ancef x1  ------------------------------------------------------------------------------------------------------------------------------------  DVT prophylaxis:  SCD/Compression stockings (holding home medication of Xarelto) - Code Status:   Code Status: Full Code Family Communication: No family member present at bedside- attempt will be made to update  The above findings and plan of care has been discussed with patient  in detail,  they expressed understanding and agreement of above. -Advance care planning has been discussed.   Admission status:    Status is: Inpatient  Remains inpatient appropriate because:Inpatient level of care appropriate due to severity of illness   Dispo: The patient is from: Home              Anticipated d/c is to: SNF  Anticipated d/c date is: 2 days              Patient currently is not medically stable to d/c.        Procedures:     Left hip open reduction internal fixation 8-12 21  Antimicrobials:  Anti-infectives (From admission, onward)   Start     Dose/Rate Route Frequency Ordered Stop   07/22/20 1145  ceFAZolin (ANCEF) IVPB 2g/100 mL premix        2 g 200 mL/hr over 30 Minutes Intravenous On call to O.R. 07/22/20 1132 07/23/20 0559       Medication:  . acetaminophen  1,000 mg Oral Once  . [MAR Hold] pantoprazole  40 mg Oral Daily  . povidone-iodine  2 application Topical Once    0.9 % irrigation (POUR BTL),  [MAR Hold] HYDROcodone-acetaminophen, [MAR Hold]  HYDROmorphone (DILAUDID) injection   Objective:   Vitals:   07/22/20 1942 07/22/20 2330 07/23/20 0300 07/23/20 0927  BP: 114/81 (!) 142/88 (!) 140/89 (!) 143/60  Pulse: 98 81 78 (!) 127  Resp: 15 17 16 18   Temp: 98.8 F (37.1 C) 98.7 F (37.1 C) 98.2 F (36.8 C) 98 F (36.7 C)  TempSrc: Oral Axillary Axillary   SpO2: 92% 98% 97% 90%  Weight:      Height:        Intake/Output Summary (Last 24 hours) at 07/23/2020 1348 Last data filed at 07/23/2020 1327 Gross per 24 hour  Intake 150 ml  Output 400 ml  Net -250 ml   Filed Weights   07/22/20 0504  Weight: (!) 95.3 kg     Examination:   Physical Exam  Constitution:  Alert, cooperative, no distress,  Appears calm and comfortable  Psychiatric: Normal and stable mood and affect, cognition intact,   HEENT: Normocephalic, PERRL, otherwise with in Normal limits  Chest:Chest symmetric Cardio vascular:  S1/S2, RRR, No murmure, No Rubs or Gallops  pulmonary: Clear to auscultation bilaterally, respirations unlabored, negative wheezes / crackles Abdomen: Soft, non-tender, non-distended, bowel sounds,no masses, no organomegaly Muscular skeletal: Limited exam - in bed, able to move all 4 extremities, Normal strength,  Neuro: CNII-XII intact. , normal motor and sensation, reflexes intact  Extremities: Limited range of motion left hip, No pitting edema lower extremities, +2 pulses  Skin: Dry, warm to touch, negative for any Rashes, No open wounds Wounds: Left hip postsurgical wound dressing in place    ------------------------------------------------------------------------------------------------------------------------------------------    LABs:  CBC Latest Ref Rng & Units 07/23/2020 07/22/2020 07/22/2020  WBC 4.0 - 10.5 K/uL 13.8(H) - 12.8(H)  Hemoglobin 12.0 - 15.0 g/dL 12.7 16.3(H) 14.1  Hematocrit 36 - 46 % 39.8 48.0(H) 43.8  Platelets 150 - 400 K/uL 191 - 211   CMP Latest Ref Rng  & Units 07/23/2020 07/22/2020 07/22/2020  Glucose 70 - 99 mg/dL 157(H) 162(H) 165(H)  BUN 8 - 23 mg/dL 17 13 12   Creatinine 0.44 - 1.00 mg/dL 1.28(H) 0.90 0.95  Sodium 135 - 145 mmol/L 139 141 137  Potassium 3.5 - 5.1 mmol/L 5.1 3.9 3.9  Chloride 98 - 111 mmol/L 106 106 106  CO2 22 - 32 mmol/L 26 - 20(L)  Calcium 8.9 - 10.3 mg/dL 8.9 - 8.8(L)  Total Protein 6.4 - 8.3 g/dL - - -  Total Bilirubin 0.20 - 1.20 mg/dL - - -  Alkaline Phos 40 - 150 U/L - - -  AST 5 - 34 U/L - - -  ALT 0 - 55 U/L - - -  Micro Results Recent Results (from the past 240 hour(s))  SARS Coronavirus 2 by RT PCR (hospital order, performed in [facility] hospital lab) Nasopharyngeal Nasopharyngeal Swab     Status: None   Collection Time: 07/22/20  5:16 AM   Specimen: Nasopharyngeal Swab  Result Value Ref Range Status   SARS Coronavirus 2 NEGATIVE NEGATIVE Final    Comment: (NOTE) SARS-CoV-2 target nucleic acids are NOT DETECTED.  The SARS-CoV-2 RNA is generally detectable in upper and lower respiratory specimens during the acute phase of infection. The lowest concentration of SARS-CoV-2 viral copies this assay can detect is 250 copies / mL. A negative result does not preclude SARS-CoV-2 infection and should not be used as the sole basis for treatment or other patient management decisions.  A negative result may occur with improper specimen collection / handling, submission of specimen other than nasopharyngeal swab, presence of viral mutation(s) within the areas targeted by this assay, and inadequate number of viral copies (<250 copies / mL). A negative result must be combined with clinical observations, patient history, and epidemiological information.  Fact Sheet for Patients:   StrictlyIdeas.no  Fact Sheet for Healthcare Providers: BankingDealers.co.za  This test is not yet approved or  cleared by the [place] FDA and has been authorized for  detection and/or diagnosis of SARS-CoV-2 by FDA under an Emergency Use Authorization (EUA).  This EUA will remain in effect (meaning this test can be used) for the duration of the COVID-19 declaration under Section 564(b)(1) of the Act, 21 U.S.C. section 360bbb-3(b)(1), unless the authorization is terminated or revoked sooner.  Performed at [facility], [place] [address], [place], [place] [address]   Surgical pcr screen     Status: None   Collection Time: 07/23/20  5:00 AM  Result Value Ref Range Status   MRSA, PCR NEGATIVE NEGATIVE Final   Staphylococcus aureus NEGATIVE NEGATIVE Final    Comment: (NOTE) The Xpert SA Assay (FDA approved for NASAL specimens in patients 34 years of age and older), is one component of a comprehensive surveillance program. It is not intended to diagnose infection nor to guide or monitor treatment. Performed at [facility], [place] [address], [place], [place] [address]     Radiology Reports DG Chest Portable 1 View  Result Date: 07/22/2020 CLINICAL DATA:  Pt presents to ED BIB [facility] from home. Pt c/o L hip pain and L elbow pain. Pt reports that she had mechanical trip/fall this morning. Pt states that she fell onto L hip and L elbow. Denies head injury or trauma, denies LOC. Pt is on blood thinners. EXAM: PORTABLE CHEST 1 VIEW COMPARISON:  Chest CT, 01/05/2013 FINDINGS: Cardiac silhouette borderline enlarged. No mediastinal or hilar masses. Chronic interstitial thickening most evident in the bases. Lungs otherwise clear. No convincing pleural effusion and no pneumothorax. Skeletal structures are demineralized but grossly intact. IMPRESSION: No acute cardiopulmonary disease. Electronically Signed   By: Lajean Manes M.D.   On: 07/22/2020 05:49   DG Hip Unilat With Pelvis 2-3 Views Left  Result Date: 07/22/2020 CLINICAL DATA:  Pt presents to ED BIB [facility] from home. Pt c/o L hip pain and L elbow pain. Pt reports that she had mechanical trip/fall this  morning. Pt states that she fell onto L hip and L elbow. Denies head injury or trauma, denies LOC. Pt is on blood thinners. EXAM: DG HIP (WITH OR WITHOUT PELVIS) 2-3V LEFT COMPARISON:  None. FINDINGS: Comminuted, displaced intertrochanteric fracture of the left proximal  femur. The femoral neck fracture component is displaced laterally relation to the shaft component, by 1.5 cm. Mild varus angulation. No other fractures.  No bone lesions. Hip joints, SI joints and pubic symphysis are normally aligned. Skeletal structures are demineralized. Soft tissues are unremarkable. IMPRESSION: 1. Comminuted, mildly displaced and varus angulated intertrochanteric fracture of the proximal left femur. No dislocation. Electronically Signed   By: Lajean Manes M.D.   On: 07/22/2020 06:29    SIGNED: Deatra James, MD, FACP, FHM. [facility],  Pager (please use [email or web address] to page/text)  If 7PM-7AM, please contact night-coverage [email or web address], Password [facility] 07/23/2020, 1:48 PM

## 2020-07-23 NOTE — Op Note (Signed)
OPERATIVE REPORT  SURGEON: Rod Can, MD   ASSISTANT: Cherlynn June, PA-C.  PREOPERATIVE DIAGNOSIS: Left intertrochanteric femur fracture.   POSTOPERATIVE DIAGNOSIS: Left intertrochanteric femur fracture.   PROCEDURE: Intramedullary fixation, Left femur.   IMPLANTS: Biomet Affixus Hip Fracture Nail, 11 by 180 mm, 125 degrees. 10.5 x 95 mm Hip Fracture Nail Lag Screw. 5 x 36 mm distal interlocking screw 1.  ANESTHESIA:  General  ESTIMATED BLOOD LOSS:-200 mL    ANTIBIOTICS: 2 g Ancef.  DRAINS: None.  COMPLICATIONS: None.   CONDITION: PACU - hemodynamically stable.   BRIEF CLINICAL NOTE: Stacy Valencia is a 86 y.o. female who presented with an intertrochanteric femur fracture. The patient was admitted to the hospitalist service and underwent perioperative risk stratification and medical optimization. The risks, benefits, and alternatives to the procedure were explained, and the patient elected to proceed.  PROCEDURE IN DETAIL: Surgical site was marked by myself. The patient was taken to the operating room and anesthesia was induced on the bed. The patient was then transferred to the [facility] table and the nonoperative lower extremity was scissored underneath the operative side. The fracture was reduced with traction, internal rotation, and adduction. The hip was prepped and draped in the normal sterile surgical fashion. Timeout was called verifying side and site of surgery. Preop antibiotics were given with 60 minutes of beginning the procedure.  Fluoroscopy was used to define the patient's anatomy. A 4 cm incision was made just proximal to the tip of the greater trochanter. The awl was used to obtain the standard starting point for a trochanteric entry nail under fluoroscopic control. The guidepin was placed. The entry reamer was used to open the proximal femur.  On the back table, the nail was assembled onto the jig. The nail was placed into the femur without any difficulty. Through  a separate stab incision, the cannula was placed down to the bone in preparation for the cephalomedullary device. A guidepin was placed into the femoral head using AP and lateral fluoroscopy views. The pin was measured, and then reaming was performed to the appropriate depth. The lag screw was inserted to the appropriate depth. The fracture was compressed through the jig. The setscrew was tightened and then loosened one quarter turn. A separate stab incision was created, and the distal interlocking screw was placed using standard AO technique. The jig was removed. Final AP and lateral fluoroscopy views were obtained to confirm fracture reduction and hardware placement. Tip apex distance was appropriate. There was no chondral penetration.  The wounds were copiously irrigated with saline. The wound was closed in layers with #1 Vicryl for the fascia, 2-0 Monocryl for the deep dermal layer, and staples for the skin. Glue was applied to the skin. Once the glue was fully hardened, sterile dressing was applied. The patient was then awakened from anesthesia and taken to the PACU in stable condition. Sponge needle and instrument counts were correct at the end of the case 2. There were no known complications.  We will readmit the patient to the hospitalist. Weightbearing status will be weightbearing as tolerated with a walker. We will begin Xarelto 10 mg daily beginning tomorrow morning for DVT prophylaxis, and after 72 hours, the home dose can be resumed. The patient will work with physical therapy and undergo disposition planning.  Please note that a surgical assistant was a medical necessity for this procedure to perform it in a safe and expeditious manner. Assistant was necessary to provide appropriate retraction of vital neurovascular structures, to  prevent femoral fracture, and to allow for anatomic placement of the prosthesis.

## 2020-07-23 NOTE — Interval H&P Note (Signed)
History and Physical Interval Note:  07/23/2020 11:37 AM  Stacy Valencia  has presented today for surgery, with the diagnosis of LEFT HIP FRACTURE.  The various methods of treatment have been discussed with the patient and family. After consideration of risks, benefits and other options for treatment, the patient has consented to  Procedure(s): INTRAMEDULLARY (IM) NAIL INTERTROCHANTRIC HIP (Left) as a surgical intervention.  The patient's history has been reviewed, patient examined, no change in status, stable for surgery.  I have reviewed the patient's chart and labs.  Questions were answered to the patient's satisfaction.     Hilton Cork Brock Larmon

## 2020-07-23 NOTE — Transfer of Care (Signed)
Immediate Anesthesia Transfer of Care Note  Patient: Stacy Valencia  Procedure(s) Performed: INTRAMEDULLARY (IM) NAIL INTERTROCHANTRIC HIP (Left )  Patient Location: PACU  Anesthesia Type:General  Level of Consciousness: sedated and patient cooperative  Airway & Oxygen Therapy: Patient Spontanous Breathing and Patient connected to face mask oxygen  Post-op Assessment: Report given to RN and Post -op Vital signs reviewed and stable  Post vital signs: Reviewed  Last Vitals:  Vitals Value Taken Time  BP 117/58 07/23/20 1436  Temp    Pulse 106 07/23/20 1437  Resp 15 07/23/20 1437  SpO2 99 % 07/23/20 1437  Vitals shown include unvalidated device data.  Last Pain:  Vitals:   07/23/20 0927  TempSrc:   PainSc: 7          Complications: No complications documented.

## 2020-07-23 NOTE — Anesthesia Postprocedure Evaluation (Signed)
Anesthesia Post Note  Patient: Stacy Valencia  Procedure(s) Performed: INTRAMEDULLARY (IM) NAIL INTERTROCHANTRIC HIP (Left )     Patient location during evaluation: PACU Anesthesia Type: General Level of consciousness: awake and alert and oriented (A&O x4 in PACU) Pain management: pain level controlled Vital Signs Assessment: post-procedure vital signs reviewed and stable Respiratory status: spontaneous breathing, nonlabored ventilation and respiratory function stable Cardiovascular status: blood pressure returned to baseline Postop Assessment: no apparent nausea or vomiting Anesthetic complications: no   No complications documented.  Last Vitals:  Vitals:   07/23/20 1520 07/23/20 1535  BP: (!) 104/58 104/64  Pulse: (!) 116 (!) 121  Resp: 16 15  Temp:    SpO2: 92% 90%    Last Pain:  Vitals:   07/23/20 1520  TempSrc:   PainSc: [place]

## 2020-07-23 NOTE — Progress Notes (Signed)
Patient received in short stay was difficult to arouse.  Patient placed on monitors, HR 120s, O2 sats 64.  O2 mask placed on 4L.  sats came up to 94% on 4L.  Dr. Daiva Huge notified.  Spoke with Meredeth Ide, RN on 5N to update and say that per Dr. Daiva Huge have patient on continuous pulse oximetry post op.

## 2020-07-24 DIAGNOSIS — Z87898 Personal history of other specified conditions: Secondary | ICD-10-CM

## 2020-07-24 DIAGNOSIS — I2699 Other pulmonary embolism without acute cor pulmonale: Secondary | ICD-10-CM

## 2020-07-24 LAB — BASIC METABOLIC PANEL
Anion gap: 7 (ref 5–15)
BUN: 24 mg/dL — ABNORMAL HIGH (ref 8–23)
CO2: 21 mmol/L — ABNORMAL LOW (ref 22–32)
Calcium: 7.9 mg/dL — ABNORMAL LOW (ref 8.9–10.3)
Chloride: 102 mmol/L (ref 98–111)
Creatinine, Ser: 1.28 mg/dL — ABNORMAL HIGH (ref 0.44–1.00)
GFR calc Af Amer: 44 mL/min — ABNORMAL LOW (ref >60)
GFR calc non Af Amer: 38 mL/min — ABNORMAL LOW (ref >60)
Glucose, Bld: 133 mg/dL — ABNORMAL HIGH (ref 70–99)
Potassium: 4.7 mmol/L (ref 3.5–5.1)
Sodium: 130 mmol/L — ABNORMAL LOW (ref 135–145)

## 2020-07-24 LAB — CBC
HCT: 28.4 % — ABNORMAL LOW (ref 36.0–46.0)
Hemoglobin: 9.2 g/dL — ABNORMAL LOW (ref 12.0–15.0)
MCH: 33.3 pg (ref 26.0–34.0)
MCHC: 32.4 g/dL (ref 30.0–36.0)
MCV: 102.9 fL — ABNORMAL HIGH (ref 80.0–100.0)
Platelets: 162 10*3/uL (ref 150–400)
RBC: 2.76 MIL/uL — ABNORMAL LOW (ref 3.87–5.11)
RDW: 14.4 % (ref 11.5–15.5)
WBC: 14.3 10*3/uL — ABNORMAL HIGH (ref 4.0–10.5)
nRBC: 0.2 % (ref 0.0–0.2)

## 2020-07-24 LAB — GLUCOSE, CAPILLARY: Glucose-Capillary: 126 mg/dL — ABNORMAL HIGH (ref 70–99)

## 2020-07-24 MED ORDER — METOPROLOL TARTRATE 25 MG PO TABS
12.5000 mg | ORAL_TABLET | Freq: Two times a day (BID) | ORAL | Status: DC
  Administered 2020-07-24 – 2020-07-25 (×3): 12.5 mg via ORAL
  Filled 2020-07-24 (×3): qty 1

## 2020-07-24 MED ORDER — MAGNESIUM SULFATE 2 GM/50ML IV SOLN
2.0000 g | Freq: Once | INTRAVENOUS | Status: AC
  Administered 2020-07-24: 2 g via INTRAVENOUS
  Filled 2020-07-24: qty 50

## 2020-07-24 MED ORDER — METOPROLOL TARTRATE 5 MG/5ML IV SOLN
2.5000 mg | Freq: Once | INTRAVENOUS | Status: AC
  Administered 2020-07-24: 2.5 mg via INTRAVENOUS
  Filled 2020-07-24: qty 5

## 2020-07-24 MED ORDER — SUCRALFATE 1 GM/10ML PO SUSP
1.0000 g | Freq: Three times a day (TID) | ORAL | Status: DC | PRN
  Administered 2020-07-24: 1 g via ORAL
  Filled 2020-07-24: qty 10

## 2020-07-24 NOTE — Plan of Care (Signed)
  Problem: Education: Goal: Knowledge of General Education information will improve Description: Including pain rating scale, medication(s)/side effects and non-pharmacologic comfort measures Outcome: Progressing   Problem: Health Behavior/Discharge Planning: Goal: Ability to manage health-related needs will improve Outcome: Progressing   Problem: Activity: Goal: Risk for activity intolerance will decrease Outcome: Progressing   

## 2020-07-24 NOTE — Progress Notes (Signed)
PROGRESS NOTE    Patient: Stacy Valencia                            PCP: [name], MD (Inactive)                    DOB: 1933/01/22            DOA: 07/22/2020 ZOX:[id number]             DOS: 07/24/2020, 11:47 AM   LOS: 2 days   Date of Service: The patient was seen and examined on 07/24/2020  Subjective:   Postop day #1 Status post left head ORIF on 07/23/2020  Postop yesterday patient was found mildly tachycardic, hypoxic.  Prior to surgery patient received 2 mg of Dilaudid which made her more hypoxic, but she recovered.  She tolerated the procedure well.  Patient was seen and examined this morning, remains on 2 L of oxygen, but no increased respiratory effort, comfortable in bed awake alert oriented no issues overnight. Patient remained mildly tachycardic otherwise stable denies any chest pain or shortness of breath.    Brief Narrative:  Per HPI: Jaye Saal a 86 y.o.femalewith a history of pulmonary embolism on Xarelto who presents to the ED with complaints of left hip pain status post mechanical fall just prior to arrival.  Patient states that she was walking to the bathroom when she tripped over a scale leading to a fall onto her left hip.  Work-up reveals comminuted mildly displaced intertrochanteric fracture of the left femur. Ortho was consulted, Xarelto was on hold--in anticipation of ORIF   Assessment & Plan:   Principal Problem:   Hip fracture (HCC) Active Problems:   Pulmonary embolism (HCC)   GERD (gastroesophageal reflux disease)   History of adverse effect of venous thromboembolism (VTE) prophylaxis  Hypoxia/  -No respiratory distress, remains on 2 L of oxygen, satting greater 94% -Brief episode of hypoxia, patient was given 2 mg of Dilaudid for pain control this morning. -Upon evaluation anesthesia to be taken to the OR patient was satting?  64% on room air, was placed on 4 L of oxygen which O2 sat improved to 94%. -We will monitor vitals, O2 sat  close -Pain medication modified -Currently no respiratory distress   Left hip fracture Comminuted mildly displaced intertrochanteric fracture of the left femur.  Postop day #1 Status post ORIF 07/23/2020 -Tolerated procedure well -Orthopedic team following -Orthopedic team has restarted Xarelto today PT OT when appropriate   Anticoagulated/history of VTE/PE x2 -Restarting Xarelto today She also should be restarted as soon as safe after surgery   Abnormal -tachyarrhythmia -Monitoring closely, replacing electrolytes including magnesium  -Denies any chest pain, troponin negative -EKG reviewed -We will continue telemetry monitor Repeat EKG, hopefully with a better baseline Curbside cardiology for their input--discussed with cardiology, they believe this is MAT  GERD Continue PPI  Hypomagnesemia -Serum magnesium 1.6 -Replacing with 2 g of IV magnesium   Debility, status post hip fracture and ORIF -Pending PT/OT evaluation recommendation -Likely will need CIR versus SNF  Antibiotics: Preop -biotics Ancef x1  ------------------------------------------------------------------------------------------------------------------------------------  DVT prophylaxis:  SCD/Compression stockings resuming Xarelto today Code Status:   Code Status: Full Code Family Communication: Discussed with her husband at bedside The above findings and plan of care has been discussed with patient  in detail,  they expressed understanding and agreement of above. -Advance care planning has been discussed.   Admission status:  Status is: Inpatient  Remains inpatient appropriate because:Inpatient level of care appropriate due to severity of illness   Dispo: The patient is from: Home              Anticipated d/c is to: SNF versus CIR              Anticipated d/c date is: 2 days              Patient currently is not medically stable to d/c.        Procedures:     Left hip  open reduction internal fixation 8-12 21  Antimicrobials:  Anti-infectives (From admission, onward)   Start     Dose/Rate Route Frequency Ordered Stop   07/22/20 1145  ceFAZolin (ANCEF) IVPB 2g/100 mL premix  Status:  Discontinued        2 g 200 mL/hr over 30 Minutes Intravenous On call to O.R. 07/22/20 1132 07/23/20 0559       Medication:  . Chlorhexidine Gluconate Cloth  6 each Topical Daily  . docusate sodium  100 mg Oral BID  . metoprolol tartrate  2.5 mg Intravenous Once  . metoprolol tartrate  12.5 mg Oral BID  . pantoprazole  40 mg Oral Daily  . rivaroxaban  10 mg Oral Daily    HYDROcodone-acetaminophen, HYDROmorphone (DILAUDID) injection, menthol-cetylpyridinium **OR** phenol, metoCLOPramide **OR** metoCLOPramide (REGLAN) injection, ondansetron **OR** ondansetron (ZOFRAN) IV   Objective:   Vitals:   07/24/20 0503 07/24/20 0819 07/24/20 1117 07/24/20 1138  BP: (!) 105/55 123/77  105/71  Pulse: 98 (!) 109  87  Resp: 15 18  18   Temp: 97.7 F (36.5 C) 98.7 F (37.1 C)  98.4 F (36.9 C)  TempSrc: Oral Oral  Oral  SpO2: 96% 94% 95% 96%  Weight:      Height:        Intake/Output Summary (Last 24 hours) at 07/24/2020 1147 Last data filed at 07/24/2020 3299 Gross per 24 hour  Intake 3173.91 ml  Output 800 ml  Net 2373.91 ml   Filed Weights   07/22/20 0504  Weight: (!) 95.3 kg     Examination:      Physical Exam:   General:  Alert, oriented, cooperative, no distress; wife at bedside  HEENT:  Normocephalic, PERRL, otherwise with in Normal limits   Neuro:  CNII-XII intact. , normal motor and sensation, reflexes intact   Lungs:   Clear to auscultation BL, Respirations unlabored, no wheezes / crackles  Cardio:    S1/S2, RRR, No murmure, No Rubs or Gallops   Abdomen:   Soft, non-tender, bowel sounds active all four quadrants,  no guarding or peritoneal signs.  Muscular skeletal:  Limited exam - in bed, able to move all 4 extremities,: Limited range of motion  left hip,  strength intact 2+ pulses,  symmetric, No pitting edema  Skin:  Dry, warm to touch, negative for any Rashes, No open wounds   Wounds: Left hip postsurgical wound dressing in place, mild edema no erythema or drainage         ------------------------------------------------------------------------------------------------------------------------------------    LABs:  CBC Latest Ref Rng & Units 07/24/2020 07/23/2020 07/23/2020  WBC 4.0 - 10.5 K/uL 14.3(H) 18.7(H) 13.8(H)  Hemoglobin 12.0 - 15.0 g/dL 9.2(L) 11.1(L) 12.7  Hematocrit 36 - 46 % 28.4(L) 35.1(L) 39.8  Platelets 150 - 400 K/uL 162 168 191   CMP Latest Ref Rng & Units 07/24/2020 07/23/2020 07/23/2020  Glucose 70 - 99 mg/dL 133(H) 205(H) 157(H)  BUN 8 - 23 mg/dL 24(H) 19 17  Creatinine 0.44 - 1.00 mg/dL 1.28(H) 1.59(H) 1.28(H)  Sodium 135 - 145 mmol/L 130(L) 135 139  Potassium 3.5 - 5.1 mmol/L 4.7 5.4(H) 5.1  Chloride 98 - 111 mmol/L 102 102 106  CO2 22 - 32 mmol/L 21(L) 22 26  Calcium 8.9 - 10.3 mg/dL 7.9(L) 8.4(L) 8.9  Total Protein 6.4 - 8.3 g/dL - - -  Total Bilirubin 0.20 - 1.20 mg/dL - - -  Alkaline Phos 40 - 150 U/L - - -  AST 5 - 34 U/L - - -  ALT 0 - 55 U/L - - -       Micro Results Recent Results (from the past 240 hour(s))  SARS Coronavirus 2 by RT PCR (hospital order, performed in [facility] hospital lab) Nasopharyngeal Nasopharyngeal Swab     Status: None   Collection Time: 07/22/20  5:16 AM   Specimen: Nasopharyngeal Swab  Result Value Ref Range Status   SARS Coronavirus 2 NEGATIVE NEGATIVE Final    Comment: (NOTE) SARS-CoV-2 target nucleic acids are NOT DETECTED.  The SARS-CoV-2 RNA is generally detectable in upper and lower respiratory specimens during the acute phase of infection. The lowest concentration of SARS-CoV-2 viral copies this assay can detect is 250 copies / mL. A negative result does not preclude SARS-CoV-2 infection and should not be used as the sole basis for treatment or  other patient management decisions.  A negative result may occur with improper specimen collection / handling, submission of specimen other than nasopharyngeal swab, presence of viral mutation(s) within the areas targeted by this assay, and inadequate number of viral copies (<250 copies / mL). A negative result must be combined with clinical observations, patient history, and epidemiological information.  Fact Sheet for Patients:   StrictlyIdeas.no  Fact Sheet for Healthcare Providers: BankingDealers.co.za  This test is not yet approved or  cleared by the [place] FDA and has been authorized for detection and/or diagnosis of SARS-CoV-2 by FDA under an Emergency Use Authorization (EUA).  This EUA will remain in effect (meaning this test can be used) for the duration of the COVID-19 declaration under Section 564(b)(1) of the Act, 21 U.S.C. section 360bbb-3(b)(1), unless the authorization is terminated or revoked sooner.  Performed at [facility], [place] [address] [place], [place] [address]   Surgical pcr screen     Status: None   Collection Time: 07/23/20  5:00 AM  Result Value Ref Range Status   MRSA, PCR NEGATIVE NEGATIVE Final   Staphylococcus aureus NEGATIVE NEGATIVE Final    Comment: (NOTE) The Xpert SA Assay (FDA approved for NASAL specimens in patients 79 years of age and older), is one component of a comprehensive surveillance program. It is not intended to diagnose infection nor to guide or monitor treatment. Performed at [facility], [place] [address], [place], [place] [address]     Radiology Reports Pelvis Portable  Result Date: 07/23/2020 CLINICAL DATA:  Postop EXAM: PORTABLE PELVIS 1-2 VIEWS COMPARISON:  July 23, 2020, July 22, 2020 FINDINGS: Status post intramedullary rod fixation of the proximal LEFT femur. Orthopedic hardware is intact and without periprosthetic fracture or lucency. Persistent  dislocation of the lesser trochanter fragment. Fracture fragments are in improved alignment. Soft tissue air. No additional acute fracture noted. IMPRESSION: Status post intramedullary rod fixation of the proximal LEFT femur with expected postsurgical appearance Electronically Signed   By: Valentino Saxon MD   On: 07/23/2020 15:11  DG Chest Portable 1 View  Result Date: 07/22/2020 CLINICAL DATA:  Pt presents to ED BIB [facility] from home. Pt c/o L hip pain and L elbow pain. Pt reports that she had mechanical trip/fall this morning. Pt states that she fell onto L hip and L elbow. Denies head injury or trauma, denies LOC. Pt is on blood thinners. EXAM: PORTABLE CHEST 1 VIEW COMPARISON:  Chest CT, 01/05/2013 FINDINGS: Cardiac silhouette borderline enlarged. No mediastinal or hilar masses. Chronic interstitial thickening most evident in the bases. Lungs otherwise clear. No convincing pleural effusion and no pneumothorax. Skeletal structures are demineralized but grossly intact. IMPRESSION: No acute cardiopulmonary disease. Electronically Signed   By: Lajean Manes M.D.   On: 07/22/2020 05:49   DG C-Arm 1-60 Min  Result Date: 07/23/2020 CLINICAL DATA:  Fracture EXAM: DG C-ARM 1-60 MIN; OPERATIVE LEFT HIP WITH PELVIS COMPARISON:  07/22/2020 FLUOROSCOPY TIME:  Fluoroscopy Time:  1 minutes 21 seconds Radiation Exposure Index (if provided by the fluoroscopic device): Not available Number of Acquired Spot Images: 2 FINDINGS: Medullary rod and proximal fixation screw are noted. Fracture fragments are in near anatomic alignment. IMPRESSION: ORIF of left femoral fracture. Electronically Signed   By: Inez Catalina M.D.   On: 07/23/2020 13:58   DG HIP OPERATIVE UNILAT W OR W/O PELVIS LEFT  Result Date: 07/23/2020 CLINICAL DATA:  Fracture EXAM: DG C-ARM 1-60 MIN; OPERATIVE LEFT HIP WITH PELVIS COMPARISON:  07/22/2020 FLUOROSCOPY TIME:  Fluoroscopy Time:  1 minutes 21 seconds Radiation Exposure Index (if provided by the  fluoroscopic device): Not available Number of Acquired Spot Images: 2 FINDINGS: Medullary rod and proximal fixation screw are noted. Fracture fragments are in near anatomic alignment. IMPRESSION: ORIF of left femoral fracture. Electronically Signed   By: Inez Catalina M.D.   On: 07/23/2020 13:58   DG Hip Unilat With Pelvis 2-3 Views Left  Result Date: 07/22/2020 CLINICAL DATA:  Pt presents to ED BIB [facility] from home. Pt c/o L hip pain and L elbow pain. Pt reports that she had mechanical trip/fall this morning. Pt states that she fell onto L hip and L elbow. Denies head injury or trauma, denies LOC. Pt is on blood thinners. EXAM: DG HIP (WITH OR WITHOUT PELVIS) 2-3V LEFT COMPARISON:  None. FINDINGS: Comminuted, displaced intertrochanteric fracture of the left proximal femur. The femoral neck fracture component is displaced laterally relation to the shaft component, by 1.5 cm. Mild varus angulation. No other fractures.  No bone lesions. Hip joints, SI joints and pubic symphysis are normally aligned. Skeletal structures are demineralized. Soft tissues are unremarkable. IMPRESSION: 1. Comminuted, mildly displaced and varus angulated intertrochanteric fracture of the proximal left femur. No dislocation. Electronically Signed   By: Lajean Manes M.D.   On: 07/22/2020 06:29    SIGNED: Deatra James, MD, FACP, FHM. [facility],  Pager (please use [email or web address] to page/text)  If 7PM-7AM, please contact night-coverage [email or web address] O[facility] 07/24/2020, 11:47 AM

## 2020-07-24 NOTE — Progress Notes (Signed)
Inpatient Rehab Admissions:  Inpatient Rehab Consult received.  I met with patient and her husband at the bedside for rehabilitation assessment and to discuss goals and expectations of an inpatient rehab admission.  Both are very interested in CIR program.  Assisted PTA with transfer back to bed and pt did exceptionally well; she would be an excellent candidate.  We discussed timing of CIR admission to occur potentially in the next few days pending approval from attending and bed availability.  Will continue to follow.    Signed: Shann Medal, PT, DPT Admissions Coordinator [phone] 07/24/20  5:23 PM

## 2020-07-24 NOTE — Progress Notes (Signed)
This Probation officer has been informed from central tele that pt had SVT 162bpm, Seen pt in room in no apparent distress. In recliner chair watching tv comfortably with husband. Denies any chest pain,/palpitations/SOB. No nausea and vomiting noted. No complaints voiced. Attending MD has been notified and received orders for metoprolol IV 2.5mg  and mag IV( see orders). Pt closely monitored as ordered.

## 2020-07-24 NOTE — TOC CAGE-AID Note (Signed)
Transition of Care [facility]) - CAGE-AID Screening   Patient Details  Name: Stacy Valencia MRN: [id number] Date of Birth: [birth year]  Transition of Care [facility]) CM/SW Contact:    Emeterio Reeve, [place] Phone Number: 07/24/2020, 4:34 PM   Clinical Narrative:  CSW met with pt at bedside. CSW introduced self and explained her role at the hospital.  Pt reports she puts a "splash" of [place] in her coffee daily. Pt denies substance use. Pt did not need resources at this time.  CAGE-AID Screening:    Have You Ever Felt You Ought to Cut Down on Your Drinking or Drug Use?: No Have People Annoyed You By Critizing Your Drinking Or Drug Use?: No Have You Felt Bad Or Guilty About Your Drinking Or Drug Use?: No Have You Ever Had a Drink or Used Drugs First Thing In The Morning to Steady Your Nerves or to Get Rid of a Hangover?: No CAGE-AID Score: 0  Substance Abuse Education Offered: Yes     Blima Ledger, [place] Social Worker [phone]

## 2020-07-24 NOTE — TOC Initial Note (Signed)
Transition of Care [facility]) - Initial/Assessment Note    Patient Details  Name: Stacy Valencia MRN: [id number] Date of Birth: [birth year]  Transition of Care [facility]) CM/SW Contact:    Curlene Labrum, RN Phone Number: 07/24/2020, 12:30 PM  Clinical Narrative:                 Case management met with the patient and husband relating to patient's S/P Left intertrochanteric femur fx repair.  The patient lives at home with her husband and was evaluated by PT for possible CIR admission.  CIR consult order placed at physician request.  The patient has had both Phizer vaccines.  Will continue to follow for CIR admission for rehabilitation.  Expected Discharge Plan: IP Rehab Facility Barriers to Discharge: Continued Medical Work up, Ship broker   Patient Goals and CMS Choice Patient states their goals for this hospitalization and ongoing recovery are:: Plans on admission to [place] Medicare.gov Compare Post Acute Care list provided to:: Patient Choice offered to / list presented to : Patient  Expected Discharge Plan and Services Expected Discharge Plan: [place]   Discharge Planning Services: CM Consult   Living arrangements for the past 2 months: Single Family Home                                      Prior Living Arrangements/Services Living arrangements for the past 2 months: Single Family Home Lives with:: Spouse Patient language and need for interpreter reviewed:: Yes Do you feel safe going back to the place where you live?: Yes      Need for Family Participation in Patient Care: Yes (Comment) Care giver support system in place?: Yes (comment)   Criminal Activity/Legal Involvement Pertinent to Current Situation/Hospitalization: No - Comment as needed  Activities of Daily Living Home Assistive Devices/Equipment: None ADL Screening (condition at time of admission) Patient's cognitive ability adequate to safely complete daily activities?: Yes Is the  patient deaf or have difficulty hearing?: No Does the patient have difficulty seeing, even when wearing glasses/contacts?: No Does the patient have difficulty concentrating, remembering, or making decisions?: No Patient able to express need for assistance with ADLs?: Yes Does the patient have difficulty dressing or bathing?: No Independently performs ADLs?: Yes (appropriate for developmental age) Does the patient have difficulty walking or climbing stairs?: Yes Weakness of Legs: Left Weakness of Arms/Hands: None  Permission Sought/Granted Permission sought to share information with : Case Manager Permission granted to share information with : Yes, Verbal Permission Granted     Permission granted to share info w AGENCY: CIR consult  Permission granted to share info w Relationship: husband     Emotional Assessment Appearance:: Appears stated age Attitude/Demeanor/Rapport: Gracious Affect (typically observed): Accepting Orientation: : Oriented to Self, Oriented to Place, Oriented to  Time, Oriented to Situation Alcohol / Substance Use: Never Used Psych Involvement: No (comment)  Admission diagnosis:  Hip fracture ([place]) [S72.009A] Closed displaced intertrochanteric fracture of left femur, initial encounter ([place]) [S72.142A] Fall, initial encounter [W19.XXXA] Patient Active Problem List   Diagnosis Date Noted  . Hip fracture ([place]) 07/22/2020  . History of adverse effect of venous thromboembolism (VTE) prophylaxis 07/22/2020  . DVT (deep venous thrombosis) ([place]) 01/07/2013  . Pulmonary embolism ([place]) 01/05/2013  . GERD (gastroesophageal reflux disease) 01/05/2013   PCP:  [name], MD (Inactive) Pharmacy:   [place], [place] -  [address] [place] [address] Phone: [phone] Fax: [phone]     Social Determinants of Health (SDOH) Interventions    Readmission Risk Interventions Readmission Risk  Prevention Plan 07/24/2020  Transportation Screening Complete  PCP or Specialist Appt within 5-7 Days Complete  Home Care Screening Complete  Medication Review (RN CM) Complete  Some recent data might be hidden

## 2020-07-24 NOTE — Evaluation (Signed)
Physical Therapy Evaluation Patient Details Name: Stacy Valencia MRN: [id number] DOB: 1933-09-21 Today's Date: 07/24/2020   History of Present Illness  Nevea Spiewak is an 86 y.o. female with PMH significant for history of VTE on Xarelto who presents to ED after fall.  Patient states that she was getting up at 3:30 in the morning last night when she tripped over a scale and fell onto her left hip and had immediate pain.  She was unable to get up.  Patient denies any head trauma or loss of consciousness. PMH includes: GERD, PE    Clinical Impression  Patient received in bed, reports significant pain (7/10 left hip). Agreeable to PT session. Patient requires max assist for supine to sit. Mod assist to maintain sitting balance with B UE support. She requires max +2 from elevated surface to perform sit to stand with multiple attempts to get fully standing. Poor safety awareness requiring max cues for safe use of AD and hand placement. She was able to take a few steps from bed to recliner although reaching prematurely for recliner, letting go of walker despite cues. Needs max +2 assist for safety and mobility. She will continue to benefit from skilled PT while here to improve functional independence, safety with mobility and strengthening.      Follow Up Recommendations CIR    Equipment Recommendations  Rolling walker with 5" wheels    Recommendations for Other Services OT consult     Precautions / Restrictions Precautions Precautions: Fall Restrictions Weight Bearing Restrictions: Yes LLE Weight Bearing: Weight bearing as tolerated      Mobility  Bed Mobility Overal bed mobility: Needs Assistance Bed Mobility: Supine to Sit     Supine to sit: Mod assist;+2 for physical assistance     General bed mobility comments: patient having difficulty sitting upright on edge of bed. Leaning to right due to left hip pain.Requires B UE support and assist to prevent falling over  Transfers Overall  transfer level: Needs assistance Equipment used: Rolling walker (2 wheeled) Transfers: Sit to/from Stand Sit to Stand: +2 physical assistance;Max assist;From elevated surface         General transfer comment: requires max assist +2 for sit to stand. Requires constant verbal cues for safe hand placement during transfers. Wants to pull on husband to try to stand.  Ambulation/Gait Ambulation/Gait assistance: Mod assist;Max assist;+2 physical assistance Gait Distance (Feet): 3 Feet Assistive device: Rolling walker (2 wheeled) Gait Pattern/deviations: Step-to pattern;Decreased stride length;Trunk flexed Gait velocity: decreased   General Gait Details: patient prematurely reaching for chair and letting go of walker during pivoting transfer from bed to recliner. Unsafe with mobility. Requires constant verbal cues for safety with mobility.  Stairs            Wheelchair Mobility    Modified Rankin (Stroke Patients Only)       Balance Overall balance assessment: Needs assistance;History of Falls Sitting-balance support: Bilateral upper extremity supported;Feet supported Sitting balance-Leahy Scale: Poor     Standing balance support: Bilateral upper extremity supported;During functional activity Standing balance-Leahy Scale: Poor Standing balance comment: reliant on RW and external support.                             Pertinent Vitals/Pain Pain Assessment: 0-10 Pain Score: 7  Pain Location: L hip Pain Descriptors / Indicators: Aching;Grimacing;Guarding;Discomfort;Sore Pain Intervention(s): Monitored during session;Limited activity within patient's tolerance;Repositioned;Premedicated before session    Home Living Family/patient expects to  be discharged to:: Private residence Living Arrangements: Spouse/significant other Available Help at Discharge: Family;Available 24 hours/day Type of Home: House Home Access: Stairs to enter   [employer] of  Steps: 4 Home Layout: One level Home Equipment: None      Prior Function Level of Independence: Independent         Comments: patient bowls, very active at baseline     Hand Dominance        Extremity/Trunk Assessment   Upper Extremity Assessment Upper Extremity Assessment: Overall WFL for tasks assessed    Lower Extremity Assessment Lower Extremity Assessment: LLE deficits/detail LLE: Unable to fully assess due to pain LLE Sensation: WNL LLE Coordination: decreased gross motor    Cervical / Trunk Assessment Cervical / Trunk Assessment: Normal  Communication   Communication: No difficulties  Cognition Arousal/Alertness: Awake/alert Behavior During Therapy: WFL for tasks assessed/performed;Impulsive Overall Cognitive Status: Within Functional Limits for tasks assessed                                 General Comments: Difficulty following direction for mobility leading to safety concerns      General Comments      Exercises Total Joint Exercises Ankle Circles/Pumps: AROM;10 reps;Both Hip ABduction/ADduction: AAROM;Left;10 reps   Assessment/Plan    PT Assessment Patient needs continued PT services  PT Problem List Decreased strength;Decreased mobility;Decreased safety awareness;Decreased activity tolerance;Decreased balance;Pain;Decreased knowledge of use of DME;Cardiopulmonary status limiting activity;Obesity;Decreased cognition       PT Treatment Interventions DME instruction;Therapeutic activities;Gait training;Therapeutic exercise;Patient/family education;Stair training;Balance training;Functional mobility training    PT Goals (Current goals can be found in the Care Plan section)  Acute Rehab PT Goals Patient Stated Goal: to decrease pain, improve mobility PT Goal Formulation: With patient/family Time For Goal Achievement: 08/07/20 Potential to Achieve Goals: Fair    Frequency Min 3X/week   Barriers to discharge Inaccessible home  environment      Co-evaluation               AM-PAC PT "6 Clicks" Mobility  Outcome Measure Help needed turning from your back to your side while in a flat bed without using bedrails?: A Lot Help needed moving from lying on your back to sitting on the side of a flat bed without using bedrails?: A Lot Help needed moving to and from a bed to a chair (including a wheelchair)?: A Lot Help needed standing up from a chair using your arms (e.g., wheelchair or bedside chair)?: A Lot Help needed to walk in hospital room?: A Lot Help needed climbing 3-5 steps with a railing? : Total 6 Click Score: 11    End of Session Equipment Utilized During Treatment: Gait belt;Oxygen Activity Tolerance: Patient limited by pain Patient left: in chair;with call bell/phone within reach;with chair alarm set;with family/visitor present Nurse Communication: Mobility status;Need for lift equipment PT Visit Diagnosis: Other abnormalities of gait and mobility (R26.89);Muscle weakness (generalized) (M62.81);History of falling (Z91.81);Difficulty in walking, not elsewhere classified (R26.2);Pain Pain - Right/Left: Left Pain - part of body: Leg    Time: 5573-2202 PT Time Calculation (min) (ACUTE ONLY): 43 min   Charges:   PT Evaluation $PT Eval Moderate Complexity: 1 Mod PT Treatments $Gait Training: 8-22 mins $Therapeutic Exercise: 8-22 mins $Therapeutic Activity: 8-22 mins        Derrian Rodak, PT, GCS 07/24/20,11:31 AM

## 2020-07-24 NOTE — Progress Notes (Signed)
    Subjective:  Patient reports pain as moderate to severe.  Denies N/V/CP/SOB. Patient is resting in bed.  Objective:   VITALS:   Vitals:   07/23/20 2044 07/24/20 0000 07/24/20 0503 07/24/20 0819  BP: 106/64 107/63 (!) 105/55 123/77  Pulse: (!) 108 100 98 (!) 109  Resp: 16 16 15 18   Temp: 98.2 F (36.8 C) 98.4 F (36.9 C) 97.7 F (36.5 C) 98.7 F (37.1 C)  TempSrc: Oral Oral Oral Oral  SpO2: 93% 100% 96% 94%  Weight:      Height:        NAD ABD soft Neurovascular intact Sensation intact distally Intact pulses distally Dorsiflexion/Plantar flexion intact Incision: dressing C/D/I   Lab Results  Component Value Date   WBC 14.3 (H) 07/24/2020   HGB 9.2 (L) 07/24/2020   HCT 28.4 (L) 07/24/2020   MCV 102.9 (H) 07/24/2020   PLT 162 07/24/2020   BMET    Component Value Date/Time   NA 130 (L) 07/24/2020 0307   NA 138 12/20/2013 0952   K 4.7 07/24/2020 0307   K 4.3 12/20/2013 0952   CL 102 07/24/2020 0307   CL 106 01/27/2013 1033   CO2 21 (L) 07/24/2020 0307   CO2 22 12/20/2013 0952   GLUCOSE 133 (H) 07/24/2020 0307   GLUCOSE 83 12/20/2013 0952   GLUCOSE 109 (H) 01/27/2013 1033   BUN 24 (H) 07/24/2020 0307   BUN 20.8 12/20/2013 0952   CREATININE 1.28 (H) 07/24/2020 0307   CREATININE 1.0 12/20/2013 0952   CALCIUM 7.9 (L) 07/24/2020 0307   CALCIUM 9.7 12/20/2013 0952   GFRNONAA 38 (L) 07/24/2020 0307   GFRAA 44 (L) 07/24/2020 0307     Assessment/Plan: 1 Day Post-Op   Principal Problem:   Hip fracture (HCC) Active Problems:   Pulmonary embolism (H[facility])   GERD (gastroesophageal reflux disease)   History of adverse effect of venous thromboembolism (VTE) prophylaxis   WBAT with walker DVT ppx: Xarelto, SCDs, TEDS PO pain control PT/OT Dispo: Anticipate D/C home with home exercise plan   Dorothyann Peng 07/24/2020, 11:14 AM   [facility] is now [employer] [address], [place], [place], [place] [address] Phone:  [phone] [email or web address] Facebook  [employer]

## 2020-07-24 NOTE — Progress Notes (Signed)
ANTICOAGULATION CONSULT NOTE - Initial Consult  Pharmacy Consult for Rivaroxaban  Indication: post-op Ortho prophylaxis  Allergies  Allergen Reactions  . Sulfa Antibiotics Nausea And Vomiting    Patient Measurements: Height: 5\' 6"  (167.6 cm) Weight: (!) 95.3 kg (210 lb) IBW/kg (Calculated) : 59.3  Vital Signs: Temp: 98.7 F (37.1 C) (08/03 0819) Temp Source: Oral (08/03 0819) BP: 123/77 (08/03 0819) Pulse Rate: 109 (08/03 0819)  Labs: Recent Labs    07/22/20 0516 07/22/20 0525 07/23/20 0755 07/23/20 0755 07/23/20 1627 07/24/20 0307  HGB 14.1   < > 12.7   < > 11.1* 9.2*  HCT 43.8   < > 39.8  --  35.1* 28.4*  PLT 211   < > 191  --  168 162  LABPROT 27.9*  --   --   --   --   --   INR 2.7*  --   --   --   --   --   CREATININE 0.95   < > 1.28*  --  1.59* 1.28*   < > = values in this interval not displayed.    Estimated Creatinine Clearance: 37.4 mL/min (A) (by C-G formula based on SCr of 1.28 mg/dL (H)).   Medical History: Past Medical History:  Diagnosis Date  . GERD (gastroesophageal reflux disease)   . PE (pulmonary embolism) 2005    Assessment: 86 yo W on rivaroxaban 20mg  daily PTA for hx provoked PEs (2005 and 2014). Pharmacy is consulted to restart.   Per Ortho, begin rivaroxaban 10 mg daily POD1 for DVT prophylaxis, and after 72 hours, the home dose can be resumed. Hgb dropped post-op at 9.2, will monitor and hold rivaroxaban if drops below 9 per consult.  Goal of Therapy:  Hgb > 9 Monitor platelets by anticoagulation protocol: Yes   Plan:  Start rivaroxaban 10mg  daily Monitor CBC daily If Hgb drops < 9, hold rivaroxaban If Hgb stable, increase rivaroxaban to 20mg  daily on 8/7  Benetta Spar, PharmD, BCPS, [place] Pharmacist  Please check AMION for all [place] phone numbers After 10:00 PM, call [place]

## 2020-07-24 NOTE — Progress Notes (Signed)
Physical Therapy Treatment Patient Details Name: Stacy Valencia MRN: [id number] DOB: 27-Mar-1933 Today's Date: 07/24/2020    History of Present Illness Tiwana Chavis is an 86 y.o. female with PMH significant for history of VTE on Xarelto who presents to ED after fall.  Patient states that she was getting up at 3:30 in the morning last night when she tripped over a scale and fell onto her left hip and had immediate pain.  She was unable to get up.  Patient denies any head trauma or loss of consciousness. PMH includes: GERD, PE    PT Comments    Pt seated on bed pan in recliner.  PTA entered room and assisted patient off bed pan and back to bed.  Once in bed applied ice to left hip for comfort.  See previous PT note for formal tx.    Follow Up Recommendations  CIR     Equipment Recommendations  Rolling walker with 5" wheels    Recommendations for Other Services       Precautions / Restrictions Precautions Precautions: Fall Restrictions Weight Bearing Restrictions: Yes LLE Weight Bearing: Weight bearing as tolerated    Mobility  Bed Mobility Overal bed mobility: Needs Assistance Bed Mobility: Sit to Supine     Supine to sit: Max assist;+2 for physical assistance     General bed mobility comments: Pt required assistance to lower trunk and lift B LEs back to bed against gravity.  Transfers Overall transfer level: Needs assistance Equipment used: Ambulation equipment used (sara stedy) Transfers: Sit to/from Stand Sit to Stand: +2 physical assistance;Mod assist         General transfer comment: Heavy mod +2 to rise into standing from recliner to sara stedy.  Cues for hand and foot placement.  Pre transfer assisted patient to edge of recliner to improve ease of transfer.  Ambulation/Gait Ambulation/Gait assistance:  (NT)               Stairs             Wheelchair Mobility    Modified Rankin (Stroke Patients Only)       Balance Overall balance  assessment: Needs assistance;History of Falls Sitting-balance support: Bilateral upper extremity supported;Feet supported Sitting balance-Leahy Scale: Poor     Standing balance support: Bilateral upper extremity supported;During functional activity Standing balance-Leahy Scale: Poor                              Cognition Arousal/Alertness: Awake/alert Behavior During Therapy: WFL for tasks assessed/performed;Impulsive Overall Cognitive Status: Within Functional Limits for tasks assessed                                 General Comments: Difficulty following direction for mobility leading to safety concerns      Exercises      General Comments        Pertinent Vitals/Pain Pain Assessment: 0-10 Pain Score: 8  Pain Location: L hip Pain Descriptors / Indicators: Aching;Grimacing;Guarding;Discomfort;Sore Pain Intervention(s): Monitored during session;Repositioned;Ice applied    Home Living                      Prior Function            PT Goals (current goals can now be found in the care plan section) Acute Rehab PT Goals Patient Stated Goal: to decrease pain, improve  mobility PT Goal Formulation: With patient/family Potential to Achieve Goals: Fair Progress towards PT goals: Progressing toward goals    Frequency    Min 3X/week      PT Plan Current plan remains appropriate    Co-evaluation              AM-PAC PT "6 Clicks" Mobility   Outcome Measure  Help needed turning from your back to your side while in a flat bed without using bedrails?: Total Help needed moving from lying on your back to sitting on the side of a flat bed without using bedrails?: Total Help needed moving to and from a bed to a chair (including a wheelchair)?: A Lot Help needed standing up from a chair using your arms (e.g., wheelchair or bedside chair)?: A Lot Help needed to walk in hospital room?: Total Help needed climbing 3-5 steps with a  railing? : Total 6 Click Score: 8    End of Session Equipment Utilized During Treatment: Gait belt Activity Tolerance: Patient limited by pain Patient left: with family/visitor present;in bed;with bed alarm set;with call bell/phone within reach Nurse Communication: Mobility status;Need for lift equipment PT Visit Diagnosis: Other abnormalities of gait and mobility (R26.89);Muscle weakness (generalized) (M62.81);History of falling (Z91.81);Difficulty in walking, not elsewhere classified (R26.2);Pain Pain - Right/Left: Left Pain - part of body: Leg     Time: 8016-5537 PT Time Calculation (min) (ACUTE ONLY): 18 min  Charges:  $Therapeutic Activity: 8-22 mins                     Erasmo Leventhal , PTA Acute Rehabilitation Services Pager [phone] Office [phone]     Marlow Berenguer Eli Hose 07/24/2020, 6:06 PM

## 2020-07-24 NOTE — Plan of Care (Signed)

## 2020-07-25 ENCOUNTER — Other Ambulatory Visit: Payer: Self-pay

## 2020-07-25 ENCOUNTER — Encounter (HOSPITAL_COMMUNITY): Payer: Self-pay | Admitting: Orthopedic Surgery

## 2020-07-25 ENCOUNTER — Inpatient Hospital Stay (HOSPITAL_COMMUNITY)
Admission: RE | Admit: 2020-07-25 | Discharge: 2020-08-10 | DRG: 560 | Disposition: A | Discharge status: Home Health | Payer: Medicare Other | Source: Intra-hospital | Attending: Physical Medicine and Rehabilitation | Admitting: Physical Medicine and Rehabilitation

## 2020-07-25 DIAGNOSIS — B9689 Other specified bacterial agents as the cause of diseases classified elsewhere: Secondary | ICD-10-CM | POA: Diagnosis present

## 2020-07-25 DIAGNOSIS — Z79899 Other long term (current) drug therapy: Secondary | ICD-10-CM

## 2020-07-25 DIAGNOSIS — Z90710 Acquired absence of both cervix and uterus: Secondary | ICD-10-CM | POA: Diagnosis not present

## 2020-07-25 DIAGNOSIS — L03116 Cellulitis of left lower limb: Secondary | ICD-10-CM | POA: Diagnosis not present

## 2020-07-25 DIAGNOSIS — K5900 Constipation, unspecified: Secondary | ICD-10-CM | POA: Diagnosis present

## 2020-07-25 DIAGNOSIS — E871 Hypo-osmolality and hyponatremia: Secondary | ICD-10-CM | POA: Diagnosis present

## 2020-07-25 DIAGNOSIS — Z7983 Long term (current) use of bisphosphonates: Secondary | ICD-10-CM | POA: Diagnosis not present

## 2020-07-25 DIAGNOSIS — Z86718 Personal history of other venous thrombosis and embolism: Secondary | ICD-10-CM

## 2020-07-25 DIAGNOSIS — K219 Gastro-esophageal reflux disease without esophagitis: Secondary | ICD-10-CM | POA: Diagnosis present

## 2020-07-25 DIAGNOSIS — F4322 Adjustment disorder with anxiety: Secondary | ICD-10-CM | POA: Diagnosis not present

## 2020-07-25 DIAGNOSIS — N3000 Acute cystitis without hematuria: Secondary | ICD-10-CM

## 2020-07-25 DIAGNOSIS — Z86711 Personal history of pulmonary embolism: Secondary | ICD-10-CM | POA: Diagnosis not present

## 2020-07-25 DIAGNOSIS — Z882 Allergy status to sulfonamides status: Secondary | ICD-10-CM | POA: Diagnosis not present

## 2020-07-25 DIAGNOSIS — N390 Urinary tract infection, site not specified: Secondary | ICD-10-CM | POA: Diagnosis not present

## 2020-07-25 DIAGNOSIS — I82409 Acute embolism and thrombosis of unspecified deep veins of unspecified lower extremity: Secondary | ICD-10-CM

## 2020-07-25 DIAGNOSIS — N179 Acute kidney failure, unspecified: Secondary | ICD-10-CM

## 2020-07-25 DIAGNOSIS — R000 Tachycardia, unspecified: Secondary | ICD-10-CM | POA: Diagnosis present

## 2020-07-25 DIAGNOSIS — D62 Acute posthemorrhagic anemia: Secondary | ICD-10-CM

## 2020-07-25 DIAGNOSIS — S72002S Fracture of unspecified part of neck of left femur, sequela: Secondary | ICD-10-CM | POA: Diagnosis not present

## 2020-07-25 DIAGNOSIS — Z1619 Resistance to other specified beta lactam antibiotics: Secondary | ICD-10-CM | POA: Diagnosis present

## 2020-07-25 DIAGNOSIS — W19XXXD Unspecified fall, subsequent encounter: Secondary | ICD-10-CM | POA: Diagnosis present

## 2020-07-25 DIAGNOSIS — S7292XA Unspecified fracture of left femur, initial encounter for closed fracture: Secondary | ICD-10-CM | POA: Diagnosis present

## 2020-07-25 DIAGNOSIS — S72002D Fracture of unspecified part of neck of left femur, subsequent encounter for closed fracture with routine healing: Secondary | ICD-10-CM

## 2020-07-25 DIAGNOSIS — M62838 Other muscle spasm: Secondary | ICD-10-CM | POA: Diagnosis not present

## 2020-07-25 DIAGNOSIS — Z7901 Long term (current) use of anticoagulants: Secondary | ICD-10-CM

## 2020-07-25 DIAGNOSIS — Z9981 Dependence on supplemental oxygen: Secondary | ICD-10-CM

## 2020-07-25 DIAGNOSIS — G8918 Other acute postprocedural pain: Secondary | ICD-10-CM

## 2020-07-25 DIAGNOSIS — S72009S Fracture of unspecified part of neck of unspecified femur, sequela: Secondary | ICD-10-CM | POA: Diagnosis not present

## 2020-07-25 DIAGNOSIS — S72009A Fracture of unspecified part of neck of unspecified femur, initial encounter for closed fracture: Secondary | ICD-10-CM | POA: Diagnosis present

## 2020-07-25 HISTORY — DX: Unspecified fracture of left femur, initial encounter for closed fracture: S72.92XA

## 2020-07-25 LAB — BASIC METABOLIC PANEL
Anion gap: 6 (ref 5–15)
BUN: 26 mg/dL — ABNORMAL HIGH (ref 8–23)
CO2: 26 mmol/L (ref 22–32)
Calcium: 8.2 mg/dL — ABNORMAL LOW (ref 8.9–10.3)
Chloride: 101 mmol/L (ref 98–111)
Creatinine, Ser: 1.3 mg/dL — ABNORMAL HIGH (ref 0.44–1.00)
GFR calc Af Amer: 43 mL/min — ABNORMAL LOW (ref >60)
GFR calc non Af Amer: 37 mL/min — ABNORMAL LOW (ref >60)
Glucose, Bld: 96 mg/dL (ref 70–99)
Potassium: 4.4 mmol/L (ref 3.5–5.1)
Sodium: 133 mmol/L — ABNORMAL LOW (ref 135–145)

## 2020-07-25 LAB — GLUCOSE, CAPILLARY
Glucose-Capillary: 85 mg/dL (ref 70–99)
Glucose-Capillary: 131 mg/dL — ABNORMAL HIGH (ref 70–99)
Glucose-Capillary: 118 mg/dL — ABNORMAL HIGH (ref 70–99)

## 2020-07-25 LAB — CBC
HCT: 24.3 % — ABNORMAL LOW (ref 36.0–46.0)
Hemoglobin: 7.8 g/dL — ABNORMAL LOW (ref 12.0–15.0)
MCH: 32.8 pg (ref 26.0–34.0)
MCHC: 32.1 g/dL (ref 30.0–36.0)
MCV: 102.1 fL — ABNORMAL HIGH (ref 80.0–100.0)
Platelets: 133 10*3/uL — ABNORMAL LOW (ref 150–400)
RBC: 2.38 MIL/uL — ABNORMAL LOW (ref 3.87–5.11)
RDW: 14.2 % (ref 11.5–15.5)
WBC: 11 10*3/uL — ABNORMAL HIGH (ref 4.0–10.5)
nRBC: 0.6 % — ABNORMAL HIGH (ref 0.0–0.2)

## 2020-07-25 LAB — HEMOGLOBIN AND HEMATOCRIT, BLOOD
HCT: 25.6 % — ABNORMAL LOW (ref 36.0–46.0)
Hemoglobin: 8.2 g/dL — ABNORMAL LOW (ref 12.0–15.0)

## 2020-07-25 MED ORDER — GUAIFENESIN-DM 100-10 MG/5ML PO SYRP: 5.0000 mL | ORAL_SOLUTION | Freq: Four times a day (QID) | ORAL | Status: DC | PRN

## 2020-07-25 MED ORDER — HYDROCODONE-ACETAMINOPHEN 5-325 MG PO TABS
1.0000 | ORAL_TABLET | Freq: Four times a day (QID) | ORAL | Status: DC | PRN
  Administered 2020-07-26: 2 via ORAL
  Administered 2020-07-26: 1 via ORAL
  Administered 2020-07-27 (×3): 2 via ORAL
  Filled 2020-07-25 (×2): qty 2
  Filled 2020-07-25: qty 1
  Filled 2020-07-25: qty 2
  Filled 2020-07-25: qty 1
  Filled 2020-07-25: qty 2

## 2020-07-25 MED ORDER — DOCUSATE SODIUM 100 MG PO CAPS
100.0000 mg | ORAL_CAPSULE | Freq: Two times a day (BID) | ORAL | Status: DC
  Administered 2020-07-25 – 2020-08-10 (×31): 100 mg via ORAL
  Filled 2020-07-25 (×32): qty 1

## 2020-07-25 MED ORDER — FLEET ENEMA 7-19 GM/118ML RE ENEM: 1.0000 | ENEMA | Freq: Once | RECTAL | Status: DC | PRN

## 2020-07-25 MED ORDER — PROCHLORPERAZINE EDISYLATE 10 MG/2ML IJ SOLN: 5.0000 mg | Freq: Four times a day (QID) | INTRAMUSCULAR | Status: DC | PRN

## 2020-07-25 MED ORDER — DOCUSATE SODIUM 100 MG PO CAPS: 100.0000 mg | ORAL_CAPSULE | Freq: Two times a day (BID) | ORAL | 0 refills | Status: DC

## 2020-07-25 MED ORDER — METOPROLOL TARTRATE 12.5 MG HALF TABLET
12.5000 mg | ORAL_TABLET | Freq: Two times a day (BID) | ORAL | Status: DC
  Administered 2020-07-25 – 2020-08-10 (×29): 12.5 mg via ORAL
  Filled 2020-07-25 (×30): qty 1

## 2020-07-25 MED ORDER — METOPROLOL TARTRATE 25 MG PO TABS: 12.5000 mg | ORAL_TABLET | Freq: Two times a day (BID) | ORAL | 0 refills | Status: DC

## 2020-07-25 MED ORDER — SUCRALFATE 1 GM/10ML PO SUSP
1.0000 g | Freq: Three times a day (TID) | ORAL | Status: DC | PRN
  Filled 2020-07-25: qty 10

## 2020-07-25 MED ORDER — BISACODYL 10 MG RE SUPP: 10.0000 mg | Freq: Every day | RECTAL | Status: DC | PRN

## 2020-07-25 MED ORDER — TRAZODONE HCL 50 MG PO TABS
25.0000 mg | ORAL_TABLET | Freq: Every evening | ORAL | Status: DC | PRN
  Administered 2020-07-26 – 2020-08-09 (×9): 50 mg via ORAL
  Filled 2020-07-25 (×10): qty 1

## 2020-07-25 MED ORDER — PROCHLORPERAZINE MALEATE 5 MG PO TABS: 5.0000 mg | ORAL_TABLET | Freq: Four times a day (QID) | ORAL | Status: DC | PRN

## 2020-07-25 MED ORDER — DIPHENHYDRAMINE HCL 12.5 MG/5ML PO ELIX
12.5000 mg | ORAL_SOLUTION | Freq: Four times a day (QID) | ORAL | Status: DC | PRN
  Administered 2020-08-03: 25 mg via ORAL
  Filled 2020-07-25: qty 10

## 2020-07-25 MED ORDER — POLYETHYLENE GLYCOL 3350 17 G PO PACK
17.0000 g | PACK | Freq: Every day | ORAL | Status: DC | PRN
  Administered 2020-07-28 – 2020-08-02 (×3): 17 g via ORAL
  Filled 2020-07-25 (×3): qty 1

## 2020-07-25 MED ORDER — PANTOPRAZOLE SODIUM 40 MG PO TBEC
40.0000 mg | DELAYED_RELEASE_TABLET | Freq: Every day | ORAL | Status: DC
  Administered 2020-07-26 – 2020-08-10 (×16): 40 mg via ORAL
  Filled 2020-07-25 (×16): qty 1

## 2020-07-25 MED ORDER — TRAMADOL HCL 50 MG PO TABS
50.0000 mg | ORAL_TABLET | Freq: Four times a day (QID) | ORAL | Status: DC | PRN
  Administered 2020-07-25 – 2020-08-09 (×32): 50 mg via ORAL
  Filled 2020-07-25 (×34): qty 1

## 2020-07-25 MED ORDER — PROCHLORPERAZINE 25 MG RE SUPP: 12.5000 mg | Freq: Four times a day (QID) | RECTAL | Status: DC | PRN

## 2020-07-25 MED ORDER — RIVAROXABAN 20 MG PO TABS
20.0000 mg | ORAL_TABLET | Freq: Every day | ORAL | Status: DC
  Administered 2020-07-26 – 2020-08-09 (×15): 20 mg via ORAL
  Filled 2020-07-25 (×15): qty 1

## 2020-07-25 MED ORDER — ALUM & MAG HYDROXIDE-SIMETH 200-200-20 MG/5ML PO SUSP
30.0000 mL | ORAL | Status: DC | PRN
  Administered 2020-08-05 – 2020-08-06 (×2): 30 mL via ORAL
  Filled 2020-07-25 (×2): qty 30

## 2020-07-25 MED ORDER — RIVAROXABAN 20 MG PO TABS
20.0000 mg | ORAL_TABLET | Freq: Every day | ORAL | Status: DC
  Administered 2020-07-25: 20 mg via ORAL
  Filled 2020-07-25: qty 1

## 2020-07-25 MED ORDER — HYDROCODONE-ACETAMINOPHEN 5-325 MG PO TABS: 1.0000 | ORAL_TABLET | Freq: Four times a day (QID) | ORAL | 0 refills | Status: DC | PRN

## 2020-07-25 MED ORDER — ACETAMINOPHEN 325 MG PO TABS
325.0000 mg | ORAL_TABLET | ORAL | Status: DC | PRN
  Administered 2020-07-28 – 2020-08-05 (×13): 650 mg via ORAL
  Filled 2020-07-25 (×16): qty 2

## 2020-07-25 NOTE — Progress Notes (Signed)
Admission note: (late entry)  pt arrived with husband at bedside, brought by a nurse and NT from pt's previous unit. Pt was on 1L nasal cannula; Left hip mepalex was falling off top incision (for which I completely removed), and staple hanging from hip near bottom incision (for which I removed with Reesa Chew at bedside around [phone]); pt was awake, alert, and oriented upon arrival. Pt was able to participate with movements for skin assessment performed by myself and Tomeka. Pt's vitals were stable, this nurse informed pt he would receive a therapy schedule, as well as explaining the bed alarm and calling before attempting to do anything , mobility wise, on her own. pt rested comfortably in bed awaiting dinner the husband ordered before leaving.

## 2020-07-25 NOTE — Progress Notes (Addendum)
Inpatient Rehabilitation Medication Review by a Pharmacist  A complete drug regimen review was completed for this patient to identify any potential clinically significant medication issues.  Clinically significant medication issues were identified: No   Type of Medication Issue Identified Description of Issue Urgent (address now) Non-Urgent (address on AM team rounds) Plan Plan Accepted by Provider? (Yes / No / Pending AM Rounds)  Drug Interaction(s) (clinically significant)       Duplicate Therapy       Allergy       No Medication Administration End Date       Incorrect Dose       Additional Drug Therapy Needed       Other  Pt reported taking alendronate on admission to [facility], ordered on discharge summary from [facility], not ordered at [facility] team will discuss on rounds Pending AM Rounds    Name of provider notified for urgent issues identified: N/A  For non-urgent medication issues to be resolved on team rounds tomorrow morning a [facility] Secure Chat Handoff was sent to: N/A (pt admitted after 5:30 PM)  Time spent performing this drug regimen review (minutes):  [place], PharmD, BCPS, [facility] Clinical Pharmacist 07/25/2020 8:49 PM

## 2020-07-25 NOTE — H&P (Signed)
Physical Medicine and Rehabilitation Admission H&P    Chief Complaint  Patient presents with  . Functional deficits due to left hip fracture    HPI: Stacy Valencia is an 86 year old female with history of PE/DVT--chronic AC (per Dr. Alen Blew), GERD who was admitted on 07/22/2020 after a fall with resulting left hip pain.  History taken from chart review, husband, and patient. She was found to have comminuted, mildly displaced angulated IT proximal left femur fracture.  She was evaluated by Dr. Lyla Glassing and underwent IM nail fixation of left femur on 07/23/2020.  Postop to be WBAT and Xarelto resumed on 08/03.  Hospital course further complicated by hypoxia and tachycardia due to to IV narcotics.  She required 4 L of supplemental oxygen as well as IV metoprolol for rate control and has been transitioned to low-dose p.o. metoprolol .  Mentation improved off Dilaudid and pain currently controlled on hydrocodone as needed. Follow-up labs showed acute blood loss anemia with H&H down to 8.2/25.6 as well as hyponatremia, with acute renal failure --BUN/SCr 26/1.30. Therapy ongoing and patient working on pregait activity in [employer] lift.  CIR recommended due to functional decline.  Please see preadmission assessment from earlier today as well.  Review of Systems  Gastrointestinal: Positive for constipation.  Musculoskeletal: Positive for joint pain and myalgias.  Neurological: Positive for focal weakness and weakness. Negative for sensory change and speech change.  All other systems reviewed and are negative.     Past Medical History:  Diagnosis Date  . GERD (gastroesophageal reflux disease)   . PE (pulmonary embolism) 2005   Past Surgical History:  Procedure Laterality Date  . ABDOMINAL HYSTERECTOMY    . APPENDECTOMY    . arthroscopic shoulder surgery Right 10/28/2016   decompression of SAD/DCR  . INTRAMEDULLARY (IM) NAIL INTERTROCHANTERIC Left 07/23/2020   Procedure: INTRAMEDULLARY (IM) NAIL  INTERTROCHANTRIC HIP;  Surgeon: Rod Can, MD;  Location: [facility];  Service: Orthopedics;  Laterality: Left;   Family History  Problem Relation Age of Onset  . Pulmonary embolism Neg Hx    Social History:  reports that she has never smoked. She has never used smokeless tobacco. She reports that she does not drink alcohol and does not use drugs. Allergies:  Allergies  Allergen Reactions  . Sulfa Antibiotics Nausea And Vomiting   Medications Prior to Admission  Medication Sig Dispense Refill  . alendronate (FOSAMAX) 70 MG tablet Take 70 mg by mouth every Sunday. Take with a full glass of water on an empty stomach.    Marland Kitchen omeprazole (PRILOSEC) 20 MG capsule Take 20 mg by mouth daily.    Alveda Reasons 20 MG TABS tablet TAKE ONE TABLET BY MOUTH DAILY (Patient taking differently: Take 20 mg by mouth every evening. ) 30 tablet 0    Drug Regimen Review  Drug regimen was reviewed and remains appropriate with no significant issues identified  Home: Home Living Family/patient expects to be discharged to:: Private residence Living Arrangements: Spouse/significant other Available Help at Discharge: Family, Available 24 hours/day Type of Home: House Home Access: Stairs to enter Technical brewer of Steps: 4 Home Layout: One level Home Equipment: None   Functional History: Prior Function Level of Independence: Independent Comments: patient bowls, very active at baseline  Functional Status:  Mobility: Bed Mobility Overal bed mobility: Needs Assistance Bed Mobility: Sit to Supine Supine to sit: Max assist, +2 for physical assistance General bed mobility comments: Pt required assistance to lower trunk and lift B LEs  back to bed against gravity. Transfers Overall transfer level: Needs assistance Equipment used: Ambulation equipment used (sara stedy) Transfers: Sit to/from Stand Sit to Stand: +2 physical assistance, Mod assist General transfer comment: Heavy mod +2 to rise into  standing from recliner to sara stedy.  Cues for hand and foot placement.  Pre transfer assisted patient to edge of recliner to improve ease of transfer. Ambulation/Gait Ambulation/Gait assistance:  (NT) Gait Distance (Feet): 3 Feet Assistive device: Rolling walker (2 wheeled) Gait Pattern/deviations: Step-to pattern, Decreased stride length, Trunk flexed General Gait Details: patient prematurely reaching for chair and letting go of walker during pivoting transfer from bed to recliner. Unsafe with mobility. Requires constant verbal cues for safety with mobility. Gait velocity: decreased    ADL:    Cognition: Cognition Overall Cognitive Status: Within Functional Limits for tasks assessed Orientation Level: Oriented X4 Cognition Arousal/Alertness: Awake/alert Behavior During Therapy: WFL for tasks assessed/performed, Impulsive Overall Cognitive Status: Within Functional Limits for tasks assessed General Comments: Difficulty following direction for mobility leading to safety concerns  Physical Exam: Blood pressure 133/63, pulse 91, temperature 98.3 F (36.8 C), temperature source Oral, resp. rate 17, height 5\' 6"  (1.676 m), weight (!) 95.3 kg, SpO2 98 %. Physical Exam Vitals reviewed.  Constitutional:      General: She is not in acute distress.    Appearance: Normal appearance.  HENT:     Head: Normocephalic and atraumatic.     Right Ear: External ear normal.     Left Ear: External ear normal.     Nose: Nose normal.  Eyes:     General:        Right eye: No discharge.        Left eye: No discharge.     Extraocular Movements: Extraocular movements intact.  Cardiovascular:     Rate and Rhythm: Normal rate and regular rhythm.  Pulmonary:     Effort: Pulmonary effort is normal. No respiratory distress.     Breath sounds: Normal breath sounds. No stridor.     Comments: + [place]. Abdominal:     General: Abdomen is flat. Bowel sounds are normal. There is distension.  Musculoskeletal:      Cervical back: Normal range of motion.     Comments: Left hip with edema and tenderness  Skin:    General: Skin is warm and dry.  Neurological:     Mental Status: She is alert.     Comments: Alert and oriented Motor: Bilateral upper extremities, right lower extremity: 5/5 proximal distal Left lower extremity: Hip flexion 2/5 (pain inhibition), ankle dorsiflexion 4+/5  Psychiatric:        Mood and Affect: Mood normal.        Behavior: Behavior normal.     Results for orders placed or performed during the hospital encounter of 07/22/20 (from the past 48 hour(s))  Hemoglobin A1c     Status: None   Collection Time: 07/23/20  4:27 PM  Result Value Ref Range   Hgb A1c MFr Bld 5.5 4.8 - 5.6 %    Comment: (NOTE) Pre diabetes:          5.7%-6.4%  Diabetes:              >6.4%  Glycemic control for   <7.0% adults with diabetes    Mean Plasma Glucose 111.15 mg/dL    Comment: Performed at [facility], [place] [address], [place], [place] [address]  CBC with Differential/Platelet     Status: Abnormal  Collection Time: 07/23/20  4:27 PM  Result Value Ref Range   WBC 18.7 (H) 4.0 - 10.5 K/uL   RBC 3.31 (L) 3.87 - 5.11 MIL/uL   Hemoglobin 11.1 (L) 12.0 - 15.0 g/dL   HCT 35.1 (L) 36 - 46 %   MCV 106.0 (H) 80.0 - 100.0 fL   MCH 33.5 26.0 - 34.0 pg   MCHC 31.6 30.0 - 36.0 g/dL   RDW 14.7 11.5 - 15.5 %   Platelets 168 150 - 400 K/uL   nRBC 0.0 0.0 - 0.2 %   Neutrophils Relative % 88 %   Neutro Abs 16.5 (H) 1.7 - 7.7 K/uL   Lymphocytes Relative 6 %   Lymphs Abs 1.1 0.7 - 4.0 K/uL   Monocytes Relative 5 %   Monocytes Absolute 0.9 0 - 1 K/uL   Eosinophils Relative 0 %   Eosinophils Absolute 0.1 0 - 0 K/uL   Basophils Relative 0 %   Basophils Absolute 0.1 0 - 0 K/uL   Immature Granulocytes 1 %   Abs Immature Granulocytes 0.13 (H) 0.00 - 0.07 K/uL    Comment: Performed at [facility], [address], [place], [place] [address]  Basic metabolic panel     Status: Abnormal     Collection Time: 07/23/20  4:27 PM  Result Value Ref Range   Sodium 135 135 - 145 mmol/L   Potassium 5.4 (H) 3.5 - 5.1 mmol/L   Chloride 102 98 - 111 mmol/L   CO2 22 22 - 32 mmol/L   Glucose, Bld 205 (H) 70 - 99 mg/dL    Comment: Glucose reference range applies only to samples taken after fasting for at least 8 hours.   BUN 19 8 - 23 mg/dL   Creatinine, Ser 1.59 (H) 0.44 - 1.00 mg/dL   Calcium 8.4 (L) 8.9 - 10.3 mg/dL   GFR calc non Af Amer 29 (L) >60 mL/min   GFR calc Af Amer 34 (L) >60 mL/min   Anion gap 11 5 - 15    Comment: Performed at [facility] [address] [place], [place] [address]  Magnesium     Status: Abnormal   Collection Time: 07/23/20  4:27 PM  Result Value Ref Range   Magnesium 1.6 (L) 1.7 - 2.4 mg/dL    Comment: Performed at [facility] [address], [place], [place] [address]  CBC     Status: Abnormal   Collection Time: 07/24/20  3:07 AM  Result Value Ref Range   WBC 14.3 (H) 4.0 - 10.5 K/uL   RBC 2.76 (L) 3.87 - 5.11 MIL/uL   Hemoglobin 9.2 (L) 12.0 - 15.0 g/dL   HCT 28.4 (L) 36 - 46 %   MCV 102.9 (H) 80.0 - 100.0 fL   MCH 33.3 26.0 - 34.0 pg   MCHC 32.4 30.0 - 36.0 g/dL   RDW 14.4 11.5 - 15.5 %   Platelets 162 150 - 400 K/uL   nRBC 0.2 0.0 - 0.2 %    Comment: Performed at [facility], [place] [address] [place], [place] [address]  Basic metabolic panel     Status: Abnormal   Collection Time: 07/24/20  3:07 AM  Result Value Ref Range   Sodium 130 (L) 135 - 145 mmol/L   Potassium 4.7 3.5 - 5.1 mmol/L   Chloride 102 98 - 111 mmol/L   CO2 21 (L) 22 - 32 mmol/L   Glucose, Bld 133 (H)  70 - 99 mg/dL    Comment: Glucose reference range applies only to samples taken after fasting for at least 8 hours.   BUN 24 (H) 8 - 23 mg/dL   Creatinine, Ser 1.28 (H) 0.44 - 1.00 mg/dL   Calcium 7.9 (L) 8.9 - 10.3 mg/dL   GFR calc non Af Amer 38 (L) >60 mL/min   GFR calc Af Amer 44 (L) >60 mL/min   Anion gap 7 5 - 15    Comment: Performed at  [facility] [address] [place], [place] [address]  Glucose, capillary     Status: Abnormal   Collection Time: 07/24/20  9:23 PM  Result Value Ref Range   Glucose-Capillary 126 (H) 70 - 99 mg/dL    Comment: Glucose reference range applies only to samples taken after fasting for at least 8 hours.   Comment 1 Notify RN   CBC     Status: Abnormal   Collection Time: 07/25/20  2:59 AM  Result Value Ref Range   WBC 11.0 (H) 4.0 - 10.5 K/uL   RBC 2.38 (L) 3.87 - 5.11 MIL/uL   Hemoglobin 7.8 (L) 12.0 - 15.0 g/dL   HCT 24.3 (L) 36 - 46 %   MCV 102.1 (H) 80.0 - 100.0 fL   MCH 32.8 26.0 - 34.0 pg   MCHC 32.1 30.0 - 36.0 g/dL   RDW 14.2 11.5 - 15.5 %   Platelets 133 (L) 150 - 400 K/uL   nRBC 0.6 (H) 0.0 - 0.2 %    Comment: Performed at [facility] [address] [place], [place] [address]  Basic metabolic panel     Status: Abnormal   Collection Time: 07/25/20  2:59 AM  Result Value Ref Range   Sodium 133 (L) 135 - 145 mmol/L   Potassium 4.4 3.5 - 5.1 mmol/L   Chloride 101 98 - 111 mmol/L   CO2 26 22 - 32 mmol/L   Glucose, Bld 96 70 - 99 mg/dL    Comment: Glucose reference range applies only to samples taken after fasting for at least 8 hours.   BUN 26 (H) 8 - 23 mg/dL   Creatinine, Ser 1.30 (H) 0.44 - 1.00 mg/dL   Calcium 8.2 (L) 8.9 - 10.3 mg/dL   GFR calc non Af Amer 37 (L) >60 mL/min   GFR calc Af Amer 43 (L) >60 mL/min   Anion gap 6 5 - 15    Comment: Performed at [facility] [address] [place], [place] [address]  Glucose, capillary     Status: None   Collection Time: 07/25/20  6:51 AM  Result Value Ref Range   Glucose-Capillary 85 70 - 99 mg/dL    Comment: Glucose reference range applies only to samples taken after fasting for at least 8 hours.  Hemoglobin and hematocrit, blood     Status: Abnormal   Collection Time: 07/25/20 10:14 AM  Result Value Ref Range   Hemoglobin 8.2 (L) 12.0 - 15.0 g/dL   HCT 25.6 (L) 36 - 46 %    Comment: Performed  at [facility] [address] [place], [place] [address]  Glucose, capillary     Status: Abnormal   Collection Time: 07/25/20 11:24 AM  Result Value Ref Range   Glucose-Capillary 131 (H) 70 - 99 mg/dL    Comment: Glucose reference range applies only to samples taken after fasting for at least 8 hours.   Pelvis Portable  Result Date: 07/23/2020 CLINICAL DATA:  Postop EXAM: PORTABLE PELVIS 1-2 VIEWS COMPARISON:  July 23, 2020, July 22, 2020 FINDINGS: Status post intramedullary rod fixation of the proximal LEFT femur. Orthopedic hardware is intact and without periprosthetic fracture or lucency. Persistent dislocation of the lesser trochanter fragment. Fracture fragments are in improved alignment. Soft tissue air. No additional acute fracture noted. IMPRESSION: Status post intramedullary rod fixation of the proximal LEFT femur with expected postsurgical appearance Electronically Signed   By: Valentino Saxon MD   On: 07/23/2020 15:11   DG C-Arm 1-60 Min  Result Date: 07/23/2020 CLINICAL DATA:  Fracture EXAM: DG C-ARM 1-60 MIN; OPERATIVE LEFT HIP WITH PELVIS COMPARISON:  07/22/2020 FLUOROSCOPY TIME:  Fluoroscopy Time:  1 minutes 21 seconds Radiation Exposure Index (if provided by the fluoroscopic device): Not available Number of Acquired Spot Images: 2 FINDINGS: Medullary rod and proximal fixation screw are noted. Fracture fragments are in near anatomic alignment. IMPRESSION: ORIF of left femoral fracture. Electronically Signed   By: Inez Catalina M.D.   On: 07/23/2020 13:58   DG HIP OPERATIVE UNILAT W OR W/O PELVIS LEFT  Result Date: 07/23/2020 CLINICAL DATA:  Fracture EXAM: DG C-ARM 1-60 MIN; OPERATIVE LEFT HIP WITH PELVIS COMPARISON:  07/22/2020 FLUOROSCOPY TIME:  Fluoroscopy Time:  1 minutes 21 seconds Radiation Exposure Index (if provided by the fluoroscopic device): Not available Number of Acquired Spot Images: 2 FINDINGS: Medullary rod and proximal fixation screw are noted. Fracture  fragments are in near anatomic alignment. IMPRESSION: ORIF of left femoral fracture. Electronically Signed   By: Inez Catalina M.D.   On: 07/23/2020 13:58       Medical Problem List and Plan: 1.  Deficits with mobility, transfers, self-care secondary to left hip fracture status post IM nail.  -patient may not shower  -ELOS/Goals: 15-19 days/min a  Admit to CIR 2. H/o PE/ Antithrombotics: -DVT/anticoagulation:  Pharmaceutical: Xarelto  -antiplatelet therapy: N/A 3. Pain Management: Hydrocodone as needed--monitor for signs of sedation or confusion  Monitor with increased exertion 4. Mood: LCSW to follow for evaluation and support  -antipsychotic agents: N/A 5. Neuropsych: This patient is capable of making decisions on her own behalf. 6. Skin/Wound Care: Monitor wound for healing.  Added protein supplements to promote healing 7. Fluids/Electrolytes/Nutrition: Monitor I's/O.  Encourage fluid intake.  CMP ordered for tomorrow a.m. 8. GERD: Continue Protonix with Carafate as needed indigestion 9. ABLA: Added iron supplement.  CBC ordered for tomorrow a.m. 10.  Tachycardia: Continue to monitor heart rate/BP 3 times daily.  On Lopressor 12.5 mg twice daily  Monitor with increased activity. 11.  Acute renal injury: Encourage fluid intake.    CMP ordered for tomorrow a.m. 12.  Supplemental oxygen dependent  Wean as tolerated  Bary Leriche, PA-C 07/25/2020  I have personally performed a face to face diagnostic evaluation, including, but not limited to relevant history and physical exam findings, of this patient and developed relevant assessment and plan.  Additionally, I have reviewed and concur with the physician assistant's documentation above.  Delice Lesch, MD, ABPMR

## 2020-07-25 NOTE — Progress Notes (Signed)
PMR Admission Coordinator Pre-Admission Assessment   Patient: Stacy Valencia is an 86 y.o., female MRN: [id number] DOB: Dec 24, 1932 Height: $RemoveBefo'5\' 6"'IfbcBKAydbr$  (167.6 cm) Weight: (!) 95.3 kg   Insurance Information HMO:     PPO:      PCP:      IPA:      80/20:      OTHER:  PRIMARY: Medicare A and B      Policy#: [id number]      Subscriber: pt  CM Name:       Phone#:      Fax#:  Pre-Cert#: verified Civil engineer, contracting:  Benefits:  Phone #:      Name:  Eff. Date: A 02/20/00, B 08/22/00     Deduct: $1484      Out of Pocket Max: n/a      Life Max: n/a CIR: 100%      SNF: 20 full days  Outpatient: 80%     Co-Pay: 20% Home Health: 100%      Co-Pay:  DME: 80%     Co-Pay: 20% Providers: pt choice   SECONDARY: [employer]      Policy#: [id number]     Phone#: [phone]   Financial Counselor:       Phone#:    The "Data Collection Information Summary" for patients in Inpatient Rehabilitation Facilities with attached "Privacy Act [place] Records" was provided and verbally reviewed with: Patient and Family   Emergency Contact Information         Contact Information     Name Relation Home Work Mobile    Liguori,[name] Spouse [phone]   [phone]         Current Medical History  Patient Admitting Diagnosis: hip fracture    History of Present Illness: Pt is an 86 y/o female with PMH of pulmonary embolism, who was admitted to [facility] on 8/1 after a ground level fall.  Found to have comminuted, displaced intertrochanteric femur fracture.  Xarelto was held in anticipation of ORIF.  Ortho consulted and recommended surgical intervention.  Pt underwent ORIF of L hip on 8/2 with Dr. Lyla Glassing.  Post op pt to be WBAT on LLE.  Post op course complicated by tacchycardia (EKG negative), hypoxia (improved when off IV meds), and ABLA.  Therapy evaluations were completed and pt was recommended for CIR.    Patient's medical record from [facility] has been reviewed by the rehabilitation admission coordinator and  physician.   Past Medical History      Past Medical History:  Diagnosis Date  . GERD (gastroesophageal reflux disease)    . PE (pulmonary embolism) 2005      Family History   family history is not on file.   Prior Rehab/Hospitalizations Has the patient had prior rehab or hospitalizations prior to admission? No   Has the patient had major surgery during 100 days prior to admission? Yes              Current Medications   Current Facility-Administered Medications:  .  Chlorhexidine Gluconate Cloth 2 % PADS 6 each, 6 each, Topical, Daily, Shahmehdi, Seyed A, MD, 6 each at 07/25/20 [phone] .  docusate sodium (COLACE) capsule 100 mg, 100 mg, Oral, BID, Cherlynn June B, PA, 100 mg at 07/25/20 0835 .  HYDROcodone-acetaminophen (NORCO/VICODIN) 5-325 MG per tablet 1-2 tablet, 1-2 tablet, Oral, Q6H PRN, Dorothyann Peng, PA, 2 tablet at 07/25/20 0839 .  HYDROmorphone (DILAUDID) injection 0.5 mg, 0.5 mg, Intravenous, Q4H PRN,  Cherlynn June B, PA .  menthol-cetylpyridinium (CEPACOL) lozenge 3 mg, 1 lozenge, Oral, PRN **OR** phenol (CHLORASEPTIC) mouth spray 1 spray, 1 spray, Mouth/Throat, PRN, McCauley, Larry B, PA .  metoCLOPramide (REGLAN) tablet 5-10 mg, 5-10 mg, Oral, Q8H PRN **OR** metoCLOPramide (REGLAN) injection 5-10 mg, 5-10 mg, Intravenous, Q8H PRN, McCauley, Larry B, PA .  metoprolol tartrate (LOPRESSOR) tablet 12.5 mg, 12.5 mg, Oral, BID, Shahmehdi, Seyed A, MD, 12.5 mg at 07/25/20 0839 .  ondansetron (ZOFRAN) tablet 4 mg, 4 mg, Oral, Q6H PRN **OR** ondansetron (ZOFRAN) injection 4 mg, 4 mg, Intravenous, Q6H PRN, McCauley, Larry B, PA .  pantoprazole (PROTONIX) EC tablet 40 mg, 40 mg, Oral, Daily, Cherlynn June B, PA, 40 mg at 07/25/20 0835 .  rivaroxaban (XARELTO) tablet 10 mg, 10 mg, Oral, Daily, Cherlynn June B, [place], 10 mg at 07/24/20 0929 .  sucralfate (CARAFATE) 1 GM/10ML suspension 1 g, 1 g, Oral, TID PRN, Shahmehdi, Seyed A, MD, 1 g at 07/24/20 1406   Patients Current  Diet:     Diet Order                      Diet regular Room service appropriate? Yes; Fluid consistency: Thin  Diet effective now                      Precautions / Restrictions Precautions Precautions: Fall Restrictions Weight Bearing Restrictions: Yes LLE Weight Bearing: Weight bearing as tolerated    Has the patient had 2 or more falls or a fall with injury in the past year? Yes, the fall that led to this admit.    Prior Activity Level Community (5-7x/wk): very active PTA, bowling, driving, no DME used   Prior Functional Level Self Care: Did the patient need help bathing, dressing, using the toilet or eating? Independent   Indoor Mobility: Did the patient need assistance with walking from room to room (with or without device)? Independent   Stairs: Did the patient need assistance with internal or external stairs (with or without device)? Independent   Functional Cognition: Did the patient need help planning regular tasks such as shopping or remembering to take medications? Independent   Home Assistive Devices / Equipment Home Assistive Devices/Equipment: None Home Equipment: None   Prior Device Use: Indicate devices/aids used by the patient prior to current illness, exacerbation or injury? None of the above   Current Functional Level Cognition   Overall Cognitive Status: Within Functional Limits for tasks assessed Orientation Level: Oriented X4 General Comments: Difficulty following direction for mobility leading to safety concerns    Extremity Assessment (includes Sensation/Coordination)   Upper Extremity Assessment: Overall WFL for tasks assessed  Lower Extremity Assessment: LLE deficits/detail LLE: Unable to fully assess due to pain LLE Sensation: WNL LLE Coordination: decreased gross motor     ADLs         Mobility   Overal bed mobility: Needs Assistance Bed Mobility: Sit to Supine Supine to sit: Max assist, +2 for physical assistance General bed  mobility comments: Pt required assistance to lower trunk and lift B LEs back to bed against gravity.     Transfers   Overall transfer level: Needs assistance Equipment used: Ambulation equipment used (sara stedy) Transfers: Sit to/from Stand Sit to Stand: +2 physical assistance, Mod assist General transfer comment: Heavy mod +2 to rise into standing from recliner to sara stedy.  Cues for hand and foot placement.  Pre transfer assisted patient to edge of  recliner to improve ease of transfer.     Ambulation / Gait / Stairs / Wheelchair Mobility   Ambulation/Gait Ambulation/Gait assistance:  (NT) Gait Distance (Feet): 3 Feet Assistive device: Rolling walker (2 wheeled) Gait Pattern/deviations: Step-to pattern, Decreased stride length, Trunk flexed General Gait Details: patient prematurely reaching for chair and letting go of walker during pivoting transfer from bed to recliner. Unsafe with mobility. Requires constant verbal cues for safety with mobility. Gait velocity: decreased     Posture / Balance Balance Overall balance assessment: Needs assistance, History of Falls Sitting-balance support: Bilateral upper extremity supported, Feet supported Sitting balance-Leahy Scale: Poor Standing balance support: Bilateral upper extremity supported, During functional activity Standing balance-Leahy Scale: Poor Standing balance comment: reliant on RW and external support.     Special needs/care consideration Oxygen on 2L in hospital, but no O2 at baseline and Designated visitor spouse, Paisley (from acute therapy documentation) Living Arrangements: Spouse/significant other Available Help at Discharge: Family, Available 24 hours/day Type of Home: House Home Layout: One level Home Access: Stairs to enter [employer] of Steps: [place]: No   Discharge Living Setting Plans for Discharge Living Setting: Patient's home Type of Home at  Discharge: House Discharge Home Layout: One level Discharge Home Access: Stairs to enter Entrance Stairs-Rails: None (will install rails or ramp) Entrance Stairs-Number of Steps: 4 Discharge Bathroom Shower/Tub: Tub/shower unit Discharge Bathroom Toilet: Standard Discharge Bathroom Accessibility: Yes How Accessible: Accessible via walker Does the patient have any problems obtaining your medications?: No   Social/Family/Support Systems Patient Roles: Spouse Anticipated Caregiver: robert Weiss Anticipated Caregiver's Contact Information: [phone] Ability/Limitations of Caregiver: mod assist Caregiver Availability: 24/7 Discharge Plan Discussed with Primary Caregiver: Yes Is Caregiver In Agreement with Plan?: Yes Does Caregiver/Family have Issues with Lodging/Transportation while Pt is in Rehab?: No   Goals Patient/Family Goal for Rehab: PT/OT supervision to mod I Expected length of stay: 14-18 days Pt/Family Agrees to Admission and willing to participate: Yes Program Orientation Provided & Reviewed with Pt/Caregiver Including Roles  & Responsibilities: Yes  Barriers to Discharge: Home environment access/layout   Decrease burden of Care through IP rehab admission: n/a   Possible need for SNF placement upon discharge: not anticipated   Patient Condition: I have reviewed medical records from [facility], spoken with CM, and patient and spouse. I met with patient at the bedside for inpatient rehabilitation assessment.  Patient will benefit from ongoing PT and OT, can actively participate in 3 hours of therapy a day 5 days of the week, and can make measurable gains during the admission.  Patient will also benefit from the coordinated team approach during an Inpatient Acute Rehabilitation admission.  The patient will receive intensive therapy as well as Rehabilitation physician, nursing, social worker, and care management interventions.  Due to bladder management, bowel management,  safety, skin/wound care, disease management, medication administration, pain management and patient education the patient requires 24 hour a day rehabilitation nursing.  The patient is currently mod +2 to max +2 with mobility and basic ADLs.  Discharge setting and therapy post discharge at home with home health is anticipated.  Patient has agreed to participate in the Acute Inpatient Rehabilitation Program and will admit today.   Preadmission Screen Completed By:  Michel Santee, PT, DPT 07/25/2020 10:34 AM ______________________________________________________________________   Discussed status with Dr. Posey Pronto on 07/25/20  at 11:00 AM  and received approval for admission today.   Admission Coordinator:  Michel Santee, PT, DPT time 11:00 AM Sudie Grumbling 07/25/20     Assessment/Plan: Diagnosis: Left hip fx, s/p ORIF 1. Does the need for close, 24 hr/day Medical supervision in concert with the patient's rehab needs make it unreasonable for this patient to be served in a less intensive setting? Yes 2. Co-Morbidities requiring supervision/potential complications: pulmonary embolism, tacchycardia (EKG negative), hypoxia (improved when off IV meds), and ABLA 3. Due to bowel management, safety, skin/wound care, disease management, pain management and patient education, does the patient require 24 hr/day rehab nursing? Yes 4. Does the patient require coordinated care of a physician, rehab nurse, PT, OT to address physical and functional deficits in the context of the above medical diagnosis(es)? Yes Addressing deficits in the following areas: balance, endurance, locomotion, strength, transferring, bathing, dressing, toileting and psychosocial support 5. Can the patient actively participate in an intensive therapy program of at least 3 hrs of therapy 5 days a week? Potentially 6. The potential for patient to make measurable gains while on inpatient rehab is excellent 7. Anticipated functional outcomes upon  discharge from inpatient rehab: min assist PT, min assist OT, n/a SLP 8. Estimated rehab length of stay to reach the above functional goals is: 18-23 days. 9. Anticipated discharge destination: Home 10. Overall Rehab/Functional Prognosis: good     MD Signature:

## 2020-07-25 NOTE — Progress Notes (Signed)
[facility] for Rivaroxaban  Indication: post-op Ortho prophylaxis, hx VTE  Allergies  Allergen Reactions  . Sulfa Antibiotics Nausea And Vomiting    Patient Measurements: Height: 5\' 6"  (167.6 cm) Weight: (!) 95.3 kg (210 lb) IBW/kg (Calculated) : 59.3  Vital Signs: Temp: 98.3 F (36.8 C) (08/04 0742) Temp Source: Oral (08/04 0742) BP: 133/63 (08/04 0742) Pulse Rate: 91 (08/04 0742)  Labs: Recent Labs    07/23/20 1627 07/23/20 1627 07/24/20 0307 07/24/20 0307 07/25/20 0259 07/25/20 1014  HGB 11.1*   < > 9.2*   < > 7.8* 8.2*  HCT 35.1*   < > 28.4*  --  24.3* 25.6*  PLT 168  --  162  --  133*  --   CREATININE 1.59*  --  1.28*  --  1.30*  --    < > = values in this interval not displayed.    Estimated Creatinine Clearance: 36.8 mL/min (A) (by C-G formula based on SCr of 1.3 mg/dL (H)).   Medical History: Past Medical History:  Diagnosis Date  . GERD (gastroesophageal reflux disease)   . PE (pulmonary embolism) 2005    Assessment: 86 yo female on rivaroxaban 20mg  daily PTA for history of provoked PEs (2005 and 2014) now s/p ORIF for hip fracture. Pharmacy was consulted to restart rivaroxaban. Per Ortho surgeon, initially began rivaroxaban 10 mg daily on POD1 for DVT prophylaxis with plan to increase to 20mg  daily on 8/7.   Today, Hgb has dropped from yesterday to 8.2 (redraw after initial level showed 7.8 this morning). No bleeding or hematoma at surgical site per RN. Per hospitalist attending, will resume home dose of rivaroxaban 20mg  today given the patient's high risk of VTE.   Goal of Therapy:  Monitor platelets by anticoagulation protocol: Yes   Plan:  Increase rivaroxaban to 20mg  daily Monitor CBC daily Monitor for s/sx bleeding   Brendolyn Patty, PharmD Clinical Pharmacist  07/25/2020   1:32 PM   Please check AMION for all [place] phone numbers After 10:00 PM, call the [facility] [phone]

## 2020-07-25 NOTE — Progress Notes (Addendum)
Inpatient Rehab Admissions Coordinator:   [id number]: [id number] with Dr. Avon Gully.  Agreeable to transfer to CIR.  Will let pt/family, and TOC team know.   I have a bed available for pt to admit to CIR today. Will await final clearance from Dr. Avon Gully to confirm.   Shann Medal, PT, DPT Admissions Coordinator [phone] 07/25/20  10:33 AM

## 2020-07-25 NOTE — Plan of Care (Signed)
  Problem: RH SKIN INTEGRITY Goal: RH STG SKIN FREE OF INFECTION/BREAKDOWN Outcome: Progressing   Problem: RH SAFETY Goal: RH STG ADHERE TO SAFETY PRECAUTIONS W/ASSISTANCE/DEVICE Description: STG Adhere to Safety Precautions With Assistance/Device. Outcome: Progressing   

## 2020-07-25 NOTE — H&P (Signed)
Physical Medicine and Rehabilitation Admission H&P    Chief Complaint  Patient presents with  . Functional deficits due to left hip fracture    HPI: Stacy Valencia is an 86 year old female with history of PE/DVT--chronic AC (per Dr. Alen Blew), GERD who was admitted on 07/22/2020 after a fall with resulting left hip pain.  History taken from chart review, husband, and patient. She was found to have comminuted, mildly displaced angulated IT proximal left femur fracture.  She was evaluated by Dr. Lyla Glassing and underwent IM nail fixation of left femur on 07/23/2020.  Postop to be WBAT and Xarelto resumed on 08/03.  Hospital course further complicated by hypoxia and tachycardia due to to IV narcotics.  She required 4 L of supplemental oxygen as well as IV metoprolol for rate control and has been transitioned to low-dose p.o. metoprolol .  Mentation improved off Dilaudid and pain currently controlled on hydrocodone as needed. Follow-up labs showed acute blood loss anemia with H&H down to 8.2/25.6 as well as hyponatremia, with acute renal failure --BUN/SCr 26/1.30. Therapy ongoing and patient working on pregait activity in [employer] lift.  CIR recommended due to functional decline.  Please see preadmission assessment from earlier today as well.  Review of Systems  Gastrointestinal: Positive for constipation.  Musculoskeletal: Positive for joint pain and myalgias.  Neurological: Positive for focal weakness and weakness. Negative for sensory change and speech change.  All other systems reviewed and are negative.     Past Medical History:  Diagnosis Date  . GERD (gastroesophageal reflux disease)   . PE (pulmonary embolism) 2005   Past Surgical History:  Procedure Laterality Date  . ABDOMINAL HYSTERECTOMY    . APPENDECTOMY    . arthroscopic shoulder surgery Right 10/28/2016   decompression of SAD/DCR  . INTRAMEDULLARY (IM) NAIL INTERTROCHANTERIC Left 07/23/2020   Procedure: INTRAMEDULLARY (IM) NAIL  INTERTROCHANTRIC HIP;  Surgeon: Rod Can, MD;  Location: [facility];  Service: Orthopedics;  Laterality: Left;   Family History  Problem Relation Age of Onset  . Pulmonary embolism Neg Hx    Social History:  reports that she has never smoked. She has never used smokeless tobacco. She reports that she does not drink alcohol and does not use drugs. Allergies:  Allergies  Allergen Reactions  . Sulfa Antibiotics Nausea And Vomiting   Medications Prior to Admission  Medication Sig Dispense Refill  . alendronate (FOSAMAX) 70 MG tablet Take 70 mg by mouth every Sunday. Take with a full glass of water on an empty stomach.    Marland Kitchen omeprazole (PRILOSEC) 20 MG capsule Take 20 mg by mouth daily.    Alveda Reasons 20 MG TABS tablet TAKE ONE TABLET BY MOUTH DAILY (Patient taking differently: Take 20 mg by mouth every evening. ) 30 tablet 0    Drug Regimen Review  Drug regimen was reviewed and remains appropriate with no significant issues identified  Home: Home Living Family/patient expects to be discharged to:: Private residence Living Arrangements: Spouse/significant other Available Help at Discharge: Family, Available 24 hours/day Type of Home: House Home Access: Stairs to enter Technical brewer of Steps: 4 Home Layout: One level Home Equipment: None   Functional History: Prior Function Level of Independence: Independent Comments: patient bowls, very active at baseline  Functional Status:  Mobility: Bed Mobility Overal bed mobility: Needs Assistance Bed Mobility: Sit to Supine Supine to sit: Max assist, +2 for physical assistance General bed mobility comments: Pt required assistance to lower trunk and lift B LEs  back to bed against gravity. Transfers Overall transfer level: Needs assistance Equipment used: Ambulation equipment used (sara stedy) Transfers: Sit to/from Stand Sit to Stand: +2 physical assistance, Mod assist General transfer comment: Heavy mod +2 to rise into  standing from recliner to sara stedy.  Cues for hand and foot placement.  Pre transfer assisted patient to edge of recliner to improve ease of transfer. Ambulation/Gait Ambulation/Gait assistance:  (NT) Gait Distance (Feet): 3 Feet Assistive device: Rolling walker (2 wheeled) Gait Pattern/deviations: Step-to pattern, Decreased stride length, Trunk flexed General Gait Details: patient prematurely reaching for chair and letting go of walker during pivoting transfer from bed to recliner. Unsafe with mobility. Requires constant verbal cues for safety with mobility. Gait velocity: decreased    ADL:    Cognition: Cognition Overall Cognitive Status: Within Functional Limits for tasks assessed Orientation Level: Oriented X4 Cognition Arousal/Alertness: Awake/alert Behavior During Therapy: WFL for tasks assessed/performed, Impulsive Overall Cognitive Status: Within Functional Limits for tasks assessed General Comments: Difficulty following direction for mobility leading to safety concerns  Physical Exam: Blood pressure 133/63, pulse 91, temperature 98.3 F (36.8 C), temperature source Oral, resp. rate 17, height 5\' 6"  (1.676 m), weight (!) 95.3 kg, SpO2 98 %. Physical Exam Vitals reviewed.  Constitutional:      General: She is not in acute distress.    Appearance: Normal appearance.  HENT:     Head: Normocephalic and atraumatic.     Right Ear: External ear normal.     Left Ear: External ear normal.     Nose: Nose normal.  Eyes:     General:        Right eye: No discharge.        Left eye: No discharge.     Extraocular Movements: Extraocular movements intact.  Cardiovascular:     Rate and Rhythm: Normal rate and regular rhythm.  Pulmonary:     Effort: Pulmonary effort is normal. No respiratory distress.     Breath sounds: Normal breath sounds. No stridor.     Comments: + [place]. Abdominal:     General: Abdomen is flat. Bowel sounds are normal. There is distension.  Musculoskeletal:      Cervical back: Normal range of motion.     Comments: Left hip with edema and tenderness  Skin:    General: Skin is warm and dry.  Neurological:     Mental Status: She is alert.     Comments: Alert and oriented Motor: Bilateral upper extremities, right lower extremity: 5/5 proximal distal Left lower extremity: Hip flexion 2/5 (pain inhibition), ankle dorsiflexion 4+/5  Psychiatric:        Mood and Affect: Mood normal.        Behavior: Behavior normal.     Results for orders placed or performed during the hospital encounter of 07/22/20 (from the past 48 hour(s))  Hemoglobin A1c     Status: None   Collection Time: 07/23/20  4:27 PM  Result Value Ref Range   Hgb A1c MFr Bld 5.5 4.8 - 5.6 %    Comment: (NOTE) Pre diabetes:          5.7%-6.4%  Diabetes:              >6.4%  Glycemic control for   <7.0% adults with diabetes    Mean Plasma Glucose 111.15 mg/dL    Comment: Performed at [facility], [place] [address], [place], [place] [address]  CBC with Differential/Platelet     Status: Abnormal  Collection Time: 07/23/20  4:27 PM  Result Value Ref Range   WBC 18.7 (H) 4.0 - 10.5 K/uL   RBC 3.31 (L) 3.87 - 5.11 MIL/uL   Hemoglobin 11.1 (L) 12.0 - 15.0 g/dL   HCT 35.1 (L) 36 - 46 %   MCV 106.0 (H) 80.0 - 100.0 fL   MCH 33.5 26.0 - 34.0 pg   MCHC 31.6 30.0 - 36.0 g/dL   RDW 14.7 11.5 - 15.5 %   Platelets 168 150 - 400 K/uL   nRBC 0.0 0.0 - 0.2 %   Neutrophils Relative % 88 %   Neutro Abs 16.5 (H) 1.7 - 7.7 K/uL   Lymphocytes Relative 6 %   Lymphs Abs 1.1 0.7 - 4.0 K/uL   Monocytes Relative 5 %   Monocytes Absolute 0.9 0 - 1 K/uL   Eosinophils Relative 0 %   Eosinophils Absolute 0.1 0 - 0 K/uL   Basophils Relative 0 %   Basophils Absolute 0.1 0 - 0 K/uL   Immature Granulocytes 1 %   Abs Immature Granulocytes 0.13 (H) 0.00 - 0.07 K/uL    Comment: Performed at [facility], [address], [place], [place] [address]  Basic metabolic panel     Status: Abnormal     Collection Time: 07/23/20  4:27 PM  Result Value Ref Range   Sodium 135 135 - 145 mmol/L   Potassium 5.4 (H) 3.5 - 5.1 mmol/L   Chloride 102 98 - 111 mmol/L   CO2 22 22 - 32 mmol/L   Glucose, Bld 205 (H) 70 - 99 mg/dL    Comment: Glucose reference range applies only to samples taken after fasting for at least 8 hours.   BUN 19 8 - 23 mg/dL   Creatinine, Ser 1.59 (H) 0.44 - 1.00 mg/dL   Calcium 8.4 (L) 8.9 - 10.3 mg/dL   GFR calc non Af Amer 29 (L) >60 mL/min   GFR calc Af Amer 34 (L) >60 mL/min   Anion gap 11 5 - 15    Comment: Performed at [facility] [address], [place], [place] [address]  Magnesium     Status: Abnormal   Collection Time: 07/23/20  4:27 PM  Result Value Ref Range   Magnesium 1.6 (L) 1.7 - 2.4 mg/dL    Comment: Performed at [facility] [address], [place], [place] [address]  CBC     Status: Abnormal   Collection Time: 07/24/20  3:07 AM  Result Value Ref Range   WBC 14.3 (H) 4.0 - 10.5 K/uL   RBC 2.76 (L) 3.87 - 5.11 MIL/uL   Hemoglobin 9.2 (L) 12.0 - 15.0 g/dL   HCT 28.4 (L) 36 - 46 %   MCV 102.9 (H) 80.0 - 100.0 fL   MCH 33.3 26.0 - 34.0 pg   MCHC 32.4 30.0 - 36.0 g/dL   RDW 14.4 11.5 - 15.5 %   Platelets 162 150 - 400 K/uL   nRBC 0.2 0.0 - 0.2 %    Comment: Performed at [facility], [place] [address] [place], [place] [address]  Basic metabolic panel     Status: Abnormal   Collection Time: 07/24/20  3:07 AM  Result Value Ref Range   Sodium 130 (L) 135 - 145 mmol/L   Potassium 4.7 3.5 - 5.1 mmol/L   Chloride 102 98 - 111 mmol/L   CO2 21 (L) 22 - 32 mmol/L   Glucose, Bld 133 (H)  70 - 99 mg/dL    Comment: Glucose reference range applies only to samples taken after fasting for at least 8 hours.   BUN 24 (H) 8 - 23 mg/dL   Creatinine, Ser 1.28 (H) 0.44 - 1.00 mg/dL   Calcium 7.9 (L) 8.9 - 10.3 mg/dL   GFR calc non Af Amer 38 (L) >60 mL/min   GFR calc Af Amer 44 (L) >60 mL/min   Anion gap 7 5 - 15    Comment: Performed at  [facility] [address], [place], [place] [address]  Glucose, capillary     Status: Abnormal   Collection Time: 07/24/20  9:23 PM  Result Value Ref Range   Glucose-Capillary 126 (H) 70 - 99 mg/dL    Comment: Glucose reference range applies only to samples taken after fasting for at least 8 hours.   Comment 1 Notify RN   CBC     Status: Abnormal   Collection Time: 07/25/20  2:59 AM  Result Value Ref Range   WBC 11.0 (H) 4.0 - 10.5 K/uL   RBC 2.38 (L) 3.87 - 5.11 MIL/uL   Hemoglobin 7.8 (L) 12.0 - 15.0 g/dL   HCT 24.3 (L) 36 - 46 %   MCV 102.1 (H) 80.0 - 100.0 fL   MCH 32.8 26.0 - 34.0 pg   MCHC 32.1 30.0 - 36.0 g/dL   RDW 14.2 11.5 - 15.5 %   Platelets 133 (L) 150 - 400 K/uL   nRBC 0.6 (H) 0.0 - 0.2 %    Comment: Performed at [facility] [address] [place], [place] [address]  Basic metabolic panel     Status: Abnormal   Collection Time: 07/25/20  2:59 AM  Result Value Ref Range   Sodium 133 (L) 135 - 145 mmol/L   Potassium 4.4 3.5 - 5.1 mmol/L   Chloride 101 98 - 111 mmol/L   CO2 26 22 - 32 mmol/L   Glucose, Bld 96 70 - 99 mg/dL    Comment: Glucose reference range applies only to samples taken after fasting for at least 8 hours.   BUN 26 (H) 8 - 23 mg/dL   Creatinine, Ser 1.30 (H) 0.44 - 1.00 mg/dL   Calcium 8.2 (L) 8.9 - 10.3 mg/dL   GFR calc non Af Amer 37 (L) >60 mL/min   GFR calc Af Amer 43 (L) >60 mL/min   Anion gap 6 5 - 15    Comment: Performed at [facility] [address], [place], [place] [address]  Glucose, capillary     Status: None   Collection Time: 07/25/20  6:51 AM  Result Value Ref Range   Glucose-Capillary 85 70 - 99 mg/dL    Comment: Glucose reference range applies only to samples taken after fasting for at least 8 hours.  Hemoglobin and hematocrit, blood     Status: Abnormal   Collection Time: 07/25/20 10:14 AM  Result Value Ref Range   Hemoglobin 8.2 (L) 12.0 - 15.0 g/dL   HCT 25.6 (L) 36 - 46 %    Comment: Performed  at [facility] [address] [place],  [address]  Glucose, capillary     Status: Abnormal   Collection Time: 07/25/20 11:24 AM  Result Value Ref Range   Glucose-Capillary 131 (H) 70 - 99 mg/dL    Comment: Glucose reference range applies only to samples taken after fasting for at least 8 hours.   Pelvis Portable  Result Date: 07/23/2020 CLINICAL DATA:  Postop EXAM: PORTABLE PELVIS 1-2 VIEWS COMPARISON:  July 23, 2020, July 22, 2020 FINDINGS: Status post intramedullary rod fixation of the proximal LEFT femur. Orthopedic hardware is intact and without periprosthetic fracture or lucency. Persistent dislocation of the lesser trochanter fragment. Fracture fragments are in improved alignment. Soft tissue air. No additional acute fracture noted. IMPRESSION: Status post intramedullary rod fixation of the proximal LEFT femur with expected postsurgical appearance Electronically Signed   By: Valentino Saxon MD   On: 07/23/2020 15:11   DG C-Arm 1-60 Min  Result Date: 07/23/2020 CLINICAL DATA:  Fracture EXAM: DG C-ARM 1-60 MIN; OPERATIVE LEFT HIP WITH PELVIS COMPARISON:  07/22/2020 FLUOROSCOPY TIME:  Fluoroscopy Time:  1 minutes 21 seconds Radiation Exposure Index (if provided by the fluoroscopic device): Not available Number of Acquired Spot Images: 2 FINDINGS: Medullary rod and proximal fixation screw are noted. Fracture fragments are in near anatomic alignment. IMPRESSION: ORIF of left femoral fracture. Electronically Signed   By: Inez Catalina M.D.   On: 07/23/2020 13:58   DG HIP OPERATIVE UNILAT W OR W/O PELVIS LEFT  Result Date: 07/23/2020 CLINICAL DATA:  Fracture EXAM: DG C-ARM 1-60 MIN; OPERATIVE LEFT HIP WITH PELVIS COMPARISON:  07/22/2020 FLUOROSCOPY TIME:  Fluoroscopy Time:  1 minutes 21 seconds Radiation Exposure Index (if provided by the fluoroscopic device): Not available Number of Acquired Spot Images: 2 FINDINGS: Medullary rod and proximal fixation screw are noted. Fracture  fragments are in near anatomic alignment. IMPRESSION: ORIF of left femoral fracture. Electronically Signed   By: Inez Catalina M.D.   On: 07/23/2020 13:58       Medical Problem List and Plan: 1.  Deficits with mobility, transfers, self-care secondary to left hip fracture status post IM nail.  -patient may not shower  -ELOS/Goals: 15-19 days/min a  Admit to CIR 2. H/o PE/ Antithrombotics: -DVT/anticoagulation:  Pharmaceutical: Xarelto  -antiplatelet therapy: N/A 3. Pain Management: Hydrocodone as needed--monitor for signs of sedation or confusion  Monitor with increased exertion 4. Mood: LCSW to follow for evaluation and support  -antipsychotic agents: N/A 5. Neuropsych: This patient is capable of making decisions on her own behalf. 6. Skin/Wound Care: Monitor wound for healing.  Added protein supplements to promote healing 7. Fluids/Electrolytes/Nutrition: Monitor I's/O.  Encourage fluid intake.  CMP ordered for tomorrow a.m. 8. GERD: Continue Protonix with Carafate as needed indigestion 9. ABLA: Added iron supplement.  CBC ordered for tomorrow a.m. 10.  Tachycardia: Continue to monitor heart rate/BP 3 times daily.  On Lopressor 12.5 mg twice daily  Monitor with increased activity. 11.  Acute renal injury: Encourage fluid intake.    CMP ordered for tomorrow a.m. 12.  Supplemental oxygen dependent  Wean as tolerated  Bary Leriche, PA-C 07/25/2020  I have personally performed a face to face diagnostic evaluation, including, but not limited to relevant history and physical exam findings, of this patient and developed relevant assessment and plan.  Additionally, I have reviewed and concur with the physician assistant's documentation above.  Delice Lesch, MD, ABPMR  The patient's status has not changed. The original post admission physician evaluation remains appropriate, and any changes from the pre-admission screening or documentation from the acute chart are noted above.   Delice Lesch, MD, ABPMR

## 2020-07-25 NOTE — Discharge Summary (Signed)
Physician Discharge Summary  Stacy Valencia ZHG:[id number] DOB: 1933-05-06 DOA: 07/22/2020  PCP: [name], MD (Inactive)  Admit date: 07/22/2020 Discharge date: 07/25/2020  Admitted From: Home Disposition: CIR  Recommendations for Outpatient Follow-up:  1. Follow up with PCP in 1-2 weeks 2. Please obtain BMP/CBC in the next 72 to 96 hours  Discharge Condition: Stable CODE STATUS: Full Diet recommendation: As tolerated  Brief/Interim Summary: Stacy Valencia a 86 y.o.femalewith a history of pulmonary embolism on Xarelto who presents to the ED with complaints of left hip pain status post mechanical fall just prior to arrival. Patient states that she was walking to the bathroom when she tripped over a scale leading to a fall onto her left hip. Work-up reveals comminuted mildly displaced intertrochanteric fracture of the left femur. Ortho was consulted, Xarelto was on hold--in anticipation of ORIF.  Patient met as above with left hip pain status post fall with notable left mildly displaced comminuted intertrochanteric fracture of the left femur status post ORIF on 8-21.  Patient tolerated procedure quite well, transient episode of hypoxia likely in the setting of IV narcotics for pain control and anesthesia during procedure.  Remains back on home Xarelto dosing given history of PE and DVT.  Patient had minimally downtrending hemoglobin, repeat labs this morning show hemoglobin within normal limits above 8 and otherwise stable and agreeable for discharge to CIR for ongoing treatment physical therapy and evaluation.  Discharge Diagnoses:  Principal Problem:   Hip fracture ([place]) Active Problems:   Pulmonary embolism (H[facility])   GERD (gastroesophageal reflux disease)   History of adverse effect of venous thromboembolism (VTE) prophylaxis    Discharge Instructions  Discharge Instructions    Call MD for:  difficulty breathing, headache or visual disturbances   Complete by: As directed     Call MD for:  extreme fatigue   Complete by: As directed    Call MD for:  persistant dizziness or light-headedness   Complete by: As directed    Call MD for:  redness, tenderness, or signs of infection (pain, swelling, redness, odor or green/yellow discharge around incision site)   Complete by: As directed    Call MD for:  severe uncontrolled pain   Complete by: As directed    Call MD for:  temperature >100.4   Complete by: As directed    Diet - low sodium heart healthy   Complete by: As directed    Discharge wound care:   Complete by: As directed    Per Orthopedic surgery   Increase activity slowly   Complete by: As directed      Allergies as of 07/25/2020      Reactions   Sulfa Antibiotics Nausea And Vomiting      Medication List    TAKE these medications   alendronate 70 MG tablet Commonly known as: FOSAMAX Take 70 mg by mouth every Sunday. Take with a full glass of water on an empty stomach.   docusate sodium 100 MG capsule Commonly known as: COLACE Take 1 capsule (100 mg total) by mouth 2 (two) times daily.   HYDROcodone-acetaminophen 5-325 MG tablet Commonly known as: NORCO/VICODIN Take 1-2 tablets by mouth every 6 (six) hours as needed for moderate pain.   metoprolol tartrate 25 MG tablet Commonly known as: LOPRESSOR Take 0.5 tablets (12.5 mg total) by mouth 2 (two) times daily.   omeprazole 20 MG capsule Commonly known as: PRILOSEC Take 20 mg by mouth daily.   Xarelto 20 MG Tabs tablet Generic  drug: rivaroxaban TAKE ONE TABLET BY MOUTH DAILY What changed:   how much to take  when to take this            Discharge Care Instructions  (From admission, onward)         Start     Ordered   07/25/20 0000  Discharge wound care:       Comments: Per Orthopedic surgery   07/25/20 1231          Follow-up Information    Swinteck, Aaron Edelman, MD. Schedule an appointment as soon as possible for a visit in 2 weeks.   Specialty: Orthopedic Surgery Why: For  suture removal, For wound re-check Contact information: [address] [place] [address] [phone]              Allergies  Allergen Reactions  . Sulfa Antibiotics Nausea And Vomiting    Consultations:  Orthopedic surgery  Procedures/Studies: Pelvis Portable  Result Date: 07/23/2020 CLINICAL DATA:  Postop EXAM: PORTABLE PELVIS 1-2 VIEWS COMPARISON:  July 23, 2020, July 22, 2020 FINDINGS: Status post intramedullary rod fixation of the proximal LEFT femur. Orthopedic hardware is intact and without periprosthetic fracture or lucency. Persistent dislocation of the lesser trochanter fragment. Fracture fragments are in improved alignment. Soft tissue air. No additional acute fracture noted. IMPRESSION: Status post intramedullary rod fixation of the proximal LEFT femur with expected postsurgical appearance Electronically Signed   By: Valentino Saxon MD   On: 07/23/2020 15:11   DG Chest Portable 1 View  Result Date: 07/22/2020 CLINICAL DATA:  Pt presents to ED BIB [facility] from home. Pt c/o L hip pain and L elbow pain. Pt reports that she had mechanical trip/fall this morning. Pt states that she fell onto L hip and L elbow. Denies head injury or trauma, denies LOC. Pt is on blood thinners. EXAM: PORTABLE CHEST 1 VIEW COMPARISON:  Chest CT, 01/05/2013 FINDINGS: Cardiac silhouette borderline enlarged. No mediastinal or hilar masses. Chronic interstitial thickening most evident in the bases. Lungs otherwise clear. No convincing pleural effusion and no pneumothorax. Skeletal structures are demineralized but grossly intact. IMPRESSION: No acute cardiopulmonary disease. Electronically Signed   By: Lajean Manes M.D.   On: 07/22/2020 05:49   DG C-Arm 1-60 Min  Result Date: 07/23/2020 CLINICAL DATA:  Fracture EXAM: DG C-ARM 1-60 MIN; OPERATIVE LEFT HIP WITH PELVIS COMPARISON:  07/22/2020 FLUOROSCOPY TIME:  Fluoroscopy Time:  1 minutes 21 seconds Radiation Exposure Index (if  provided by the fluoroscopic device): Not available Number of Acquired Spot Images: 2 FINDINGS: Medullary rod and proximal fixation screw are noted. Fracture fragments are in near anatomic alignment. IMPRESSION: ORIF of left femoral fracture. Electronically Signed   By: Inez Catalina M.D.   On: 07/23/2020 13:58   DG HIP OPERATIVE UNILAT W OR W/O PELVIS LEFT  Result Date: 07/23/2020 CLINICAL DATA:  Fracture EXAM: DG C-ARM 1-60 MIN; OPERATIVE LEFT HIP WITH PELVIS COMPARISON:  07/22/2020 FLUOROSCOPY TIME:  Fluoroscopy Time:  1 minutes 21 seconds Radiation Exposure Index (if provided by the fluoroscopic device): Not available Number of Acquired Spot Images: 2 FINDINGS: Medullary rod and proximal fixation screw are noted. Fracture fragments are in near anatomic alignment. IMPRESSION: ORIF of left femoral fracture. Electronically Signed   By: Inez Catalina M.D.   On: 07/23/2020 13:58   DG Hip Unilat With Pelvis 2-3 Views Left  Result Date: 07/22/2020 CLINICAL DATA:  Pt presents to ED BIB [facility] from home. Pt c/o L hip pain and  L elbow pain. Pt reports that she had mechanical trip/fall this morning. Pt states that she fell onto L hip and L elbow. Denies head injury or trauma, denies LOC. Pt is on blood thinners. EXAM: DG HIP (WITH OR WITHOUT PELVIS) 2-3V LEFT COMPARISON:  None. FINDINGS: Comminuted, displaced intertrochanteric fracture of the left proximal femur. The femoral neck fracture component is displaced laterally relation to the shaft component, by 1.5 cm. Mild varus angulation. No other fractures.  No bone lesions. Hip joints, SI joints and pubic symphysis are normally aligned. Skeletal structures are demineralized. Soft tissues are unremarkable. IMPRESSION: 1. Comminuted, mildly displaced and varus angulated intertrochanteric fracture of the proximal left femur. No dislocation. Electronically Signed   By: Lajean Manes M.D.   On: 07/22/2020 06:29     Subjective: No acute issues or events overnight, feels  quite well otherwise denies nausea vomiting, diarrhea, constipation, headache, fevers, chills.  Only complaint is left hip pain perioperatively but notes this is an ache and much improved since care.   Discharge Exam: Vitals:   07/25/20 0403 07/25/20 0742  BP: 128/65 133/63  Pulse: 91 91  Resp: 18 17  Temp: 98.3 F (36.8 C) 98.3 F (36.8 C)  SpO2: 96% 98%   Vitals:   07/24/20 1641 07/24/20 1956 07/25/20 0403 07/25/20 0742  BP: (!) 121/56 112/66 128/65 133/63  Pulse: 80 85 91 91  Resp: _0 Temp: 98.4 F (36.9 C) 98.1 F (36.7 C) 98.3 F (36.8 C) 98.3 F (36.8 C)  TempSrc: Oral Oral Oral Oral  SpO2: 93% 100% 96% 98%  Weight:      Height:        General: Pt is alert, awake, not in acute distress Cardiovascular: RRR, S1/S2 +, no rubs, no gallops Respiratory: CTA bilaterally, no wheezing, no rhonchi Abdominal: Soft, NT, ND, bowel sounds + Extremities: no edema, no cyanosis, left hip bandage clean dry intact.    The results of significant diagnostics from this hospitalization (including imaging, microbiology, ancillary and laboratory) are listed below for reference.     Microbiology: Recent Results (from the past 240 hour(s))  SARS Coronavirus 2 by RT PCR (hospital order, performed in [facility] hospital lab) Nasopharyngeal Nasopharyngeal Swab     Status: None   Collection Time: 07/22/20  5:16 AM   Specimen: Nasopharyngeal Swab  Result Value Ref Range Status   SARS Coronavirus 2 NEGATIVE NEGATIVE Final    Comment: (NOTE) SARS-CoV-2 target nucleic acids are NOT DETECTED.  The SARS-CoV-2 RNA is generally detectable in upper and lower respiratory specimens during the acute phase of infection. The lowest concentration of SARS-CoV-2 viral copies this assay can detect is 250 copies / mL. A negative result does not preclude SARS-CoV-2 infection and should not be used as the sole basis for treatment or other patient management decisions.  A negative result may  occur with improper specimen collection / handling, submission of specimen other than nasopharyngeal swab, presence of viral mutation(s) within the areas targeted by this assay, and inadequate number of viral copies (<250 copies / mL). A negative result must be combined with clinical observations, patient history, and epidemiological information.  Fact Sheet for Patients:   StrictlyIdeas.no  Fact Sheet for Healthcare Providers: BankingDealers.co.za  This test is not yet approved or  cleared by the [place] FDA and has been authorized for detection and/or diagnosis of SARS-CoV-2 by FDA under an Emergency Use Authorization (EUA).  This EUA will remain in effect (meaning this test  can be used) for the duration of the COVID-19 declaration under Section 564(b)(1) of the Act, 21 U.S.C. section 360bbb-3(b)(1), unless the authorization is terminated or revoked sooner.  Performed at [facility], [place] [address] [place], [place] [address]   Surgical pcr screen     Status: None   Collection Time: 07/23/20  5:00 AM  Result Value Ref Range Status   MRSA, PCR NEGATIVE NEGATIVE Final   Staphylococcus aureus NEGATIVE NEGATIVE Final    Comment: (NOTE) The Xpert SA Assay (FDA approved for NASAL specimens in patients 91 years of age and older), is one component of a comprehensive surveillance program. It is not intended to diagnose infection nor to guide or monitor treatment. Performed at [facility], [place] [address] [place], [place] [address]      Labs: BNP (last 3 results) No results for input(s): BNP in the last 8760 hours. Basic Metabolic Panel: Recent Labs  Lab 07/22/20 0516 07/22/20 0516 07/22/20 0525 07/23/20 0755 07/23/20 1627 07/24/20 0307 07/25/20 0259  NA 137   < > 141 139 135 130* 133*  K 3.9   < > 3.9 5.1 5.4* 4.7 4.4  CL 106   < > 106 106 102 102 101  CO2 20*  --   --  26 22 21* 26  GLUCOSE 165*   < >  162* 157* 205* 133* 96  BUN 12   < > _0 24* 26*  CREATININE 0.95   < > 0.90 1.28* 1.59* 1.28* 1.30*  CALCIUM 8.8*  --   --  8.9 8.4* 7.9* 8.2*  MG  --   --   --   --  1.6*  --   --    < > = values in this interval not displayed.   Liver Function Tests: No results for input(s): AST, ALT, ALKPHOS, BILITOT, PROT, ALBUMIN in the last 168 hours. No results for input(s): LIPASE, AMYLASE in the last 168 hours. No results for input(s): AMMONIA in the last 168 hours. CBC: Recent Labs  Lab 07/22/20 0516 07/22/20 0525 07/23/20 0755 07/23/20 1627 07/24/20 0307 07/25/20 0259 07/25/20 1014  WBC 12.8*  --  13.8* 18.7* 14.3* 11.0*  --   NEUTROABS 8.3*  --   --  16.5*  --   --   --   HGB 14.1   < > 12.7 11.1* 9.2* 7.8* 8.2*  HCT 43.8   < > 39.8 35.1* 28.4* 24.3* 25.6*  MCV 104.3*  --  105.6* 106.0* 102.9* 102.1*  --   PLT 211  --  191 168 162 133*  --    < > = values in this interval not displayed.   Cardiac Enzymes: No results for input(s): CKTOTAL, CKMB, CKMBINDEX, TROPONINI in the last 168 hours. BNP: Invalid input(s): POCBNP CBG: Recent Labs  Lab 07/24/20 2123 07/25/20 0651 07/25/20 1124  GLUCAP 126* 85 131*   D-Dimer No results for input(s): DDIMER in the last 72 hours. Hgb A1c Recent Labs    07/23/20 1627  HGBA1C 5.5   Lipid Profile No results for input(s): CHOL, HDL, LDLCALC, TRIG, CHOLHDL, LDLDIRECT in the last 72 hours. Thyroid function studies No results for input(s): TSH, T4TOTAL, T3FREE, THYROIDAB in the last 72 hours.  Invalid input(s): FREET3 Anemia work up No results for input(s): VITAMINB12, FOLATE, FERRITIN, TIBC, IRON, RETICCTPCT in the last 72 hours. Urinalysis    Component Value Date/Time   COLORURINE YELLOW 07/22/2020 0912   APPEARANCEUR CLEAR 07/22/2020 0912  LABSPEC 1.013 07/22/2020 0912   PHURINE 5.0 07/22/2020 0912   GLUCOSEU NEGATIVE 07/22/2020 0912   HGBUR NEGATIVE 07/22/2020 0912   BILIRUBINUR NEGATIVE 07/22/2020 0912   KETONESUR  NEGATIVE 07/22/2020 0912   PROTEINUR NEGATIVE 07/22/2020 0912   NITRITE NEGATIVE 07/22/2020 0912   LEUKOCYTESUR NEGATIVE 07/22/2020 0912   Sepsis Labs Invalid input(s): PROCALCITONIN,  WBC,  LACTICIDVEN Microbiology Recent Results (from the past 240 hour(s))  SARS Coronavirus 2 by RT PCR (hospital order, performed in [facility] hospital lab) Nasopharyngeal Nasopharyngeal Swab     Status: None   Collection Time: 07/22/20  5:16 AM   Specimen: Nasopharyngeal Swab  Result Value Ref Range Status   SARS Coronavirus 2 NEGATIVE NEGATIVE Final    Comment: (NOTE) SARS-CoV-2 target nucleic acids are NOT DETECTED.  The SARS-CoV-2 RNA is generally detectable in upper and lower respiratory specimens during the acute phase of infection. The lowest concentration of SARS-CoV-2 viral copies this assay can detect is 250 copies / mL. A negative result does not preclude SARS-CoV-2 infection and should not be used as the sole basis for treatment or other patient management decisions.  A negative result may occur with improper specimen collection / handling, submission of specimen other than nasopharyngeal swab, presence of viral mutation(s) within the areas targeted by this assay, and inadequate number of viral copies (<250 copies / mL). A negative result must be combined with clinical observations, patient history, and epidemiological information.  Fact Sheet for Patients:   StrictlyIdeas.no  Fact Sheet for Healthcare Providers: BankingDealers.co.za  This test is not yet approved or  cleared by the [place] FDA and has been authorized for detection and/or diagnosis of SARS-CoV-2 by FDA under an Emergency Use Authorization (EUA).  This EUA will remain in effect (meaning this test can be used) for the duration of the COVID-19 declaration under Section 564(b)(1) of the Act, 21 U.S.C. section 360bbb-3(b)(1), unless the authorization is terminated  or revoked sooner.  Performed at [facility], [place] [address], [place], [place] [address]   Surgical pcr screen     Status: None   Collection Time: 07/23/20  5:00 AM  Result Value Ref Range Status   MRSA, PCR NEGATIVE NEGATIVE Final   Staphylococcus aureus NEGATIVE NEGATIVE Final    Comment: (NOTE) The Xpert SA Assay (FDA approved for NASAL specimens in patients 61 years of age and older), is one component of a comprehensive surveillance program. It is not intended to diagnose infection nor to guide or monitor treatment. Performed at [facility], [place] [address], [place], [place] [address]      Time coordinating discharge: Over 30 minutes  SIGNED:   Little Ishikawa, DO [facility] 07/25/2020, 12:33 PM Pager   If 7PM-7AM, please contact night-coverage [email or web address]

## 2020-07-25 NOTE — PMR Pre-admission (Signed)
PMR Admission Coordinator Pre-Admission Assessment  Patient: Stacy Valencia is an 86 y.o., female MRN: [id number] DOB: 01-24-1933 Height: '5\' 6"'$  (167.6 cm) Weight: (!) 95.3 kg  Insurance Information HMO:     PPO:      PCP:      IPA:      80/20:      OTHER:  PRIMARY: Medicare A and B      Policy#: [id number]      Subscriber: pt  CM Name:       Phone#:      Fax#:  Pre-Cert#: verified Civil engineer, contracting:  Benefits:  Phone #:      Name:  Eff. Date: A 02/20/00, B 08/22/00     Deduct: $1484      Out of Pocket Max: n/a      Life Max: n/a CIR: 100%      SNF: 20 full days  Outpatient: 80%     Co-Pay: 20% Home Health: 100%      Co-Pay:  DME: 80%     Co-Pay: 20% Providers: pt choice   SECONDARY: [employer]      Policy#: [id number]     Phone#: [phone]  Financial Counselor:       Phone#:   The "Data Collection Information Summary" for patients in Inpatient Rehabilitation Facilities with attached "Privacy Act [place] Records" was provided and verbally reviewed with: Patient and Family  Emergency Contact Information Contact Information    Name Relation Home Work Mobile   Cabell,[name] Spouse [phone]  [phone]      Current Medical History  Patient Admitting Diagnosis: hip fracture   History of Present Illness: Pt is an 86 y/o female with PMH of pulmonary embolism, who was admitted to [facility] on 8/1 after a ground level fall.  Found to have comminuted, displaced intertrochanteric femur fracture.  Xarelto was held in anticipation of ORIF.  Ortho consulted and recommended surgical intervention.  Pt underwent ORIF of L hip on 8/2 with Dr. Lyla Glassing.  Post op pt to be WBAT on LLE.  Post op course complicated by tacchycardia (EKG negative), hypoxia (improved when off IV meds), and ABLA.  Therapy evaluations were completed and pt was recommended for CIR.     Patient's medical record from [facility] has been reviewed by the rehabilitation admission coordinator and physician.  Past  Medical History  Past Medical History:  Diagnosis Date  . GERD (gastroesophageal reflux disease)   . PE (pulmonary embolism) 2005    Family History   family history is not on file.  Prior Rehab/Hospitalizations Has the patient had prior rehab or hospitalizations prior to admission? No  Has the patient had major surgery during 100 days prior to admission? Yes   Current Medications  Current Facility-Administered Medications:  .  Chlorhexidine Gluconate Cloth 2 % PADS 6 each, 6 each, Topical, Daily, Shahmehdi, Seyed A, MD, 6 each at 07/25/20 [phone] .  docusate sodium (COLACE) capsule 100 mg, 100 mg, Oral, BID, Cherlynn June B, PA, 100 mg at 07/25/20 0835 .  HYDROcodone-acetaminophen (NORCO/VICODIN) 5-325 MG per tablet 1-2 tablet, 1-2 tablet, Oral, Q6H PRN, Dorothyann Peng, PA, 2 tablet at 07/25/20 0839 .  HYDROmorphone (DILAUDID) injection 0.5 mg, 0.5 mg, Intravenous, Q4H PRN, McCauley, Larry B, PA .  menthol-cetylpyridinium (CEPACOL) lozenge 3 mg, 1 lozenge, Oral, PRN **OR** phenol (CHLORASEPTIC) mouth spray 1 spray, 1 spray, Mouth/Throat, PRN, McCauley, Larry B, PA .  metoCLOPramide (REGLAN) tablet 5-10 mg, 5-10 mg, Oral, Q8H  PRN **OR** metoCLOPramide (REGLAN) injection 5-10 mg, 5-10 mg, Intravenous, Q8H PRN, Cherlynn June B, PA .  metoprolol tartrate (LOPRESSOR) tablet 12.5 mg, 12.5 mg, Oral, BID, Shahmehdi, Seyed A, MD, 12.5 mg at 07/25/20 0839 .  ondansetron (ZOFRAN) tablet 4 mg, 4 mg, Oral, Q6H PRN **OR** ondansetron (ZOFRAN) injection 4 mg, 4 mg, Intravenous, Q6H PRN, McCauley, Larry B, PA .  pantoprazole (PROTONIX) EC tablet 40 mg, 40 mg, Oral, Daily, Cherlynn June B, PA, 40 mg at 07/25/20 0835 .  rivaroxaban (XARELTO) tablet 10 mg, 10 mg, Oral, Daily, Cherlynn June B, [place], 10 mg at 07/24/20 0929 .  sucralfate (CARAFATE) 1 GM/10ML suspension 1 g, 1 g, Oral, TID PRN, Shahmehdi, Seyed A, MD, 1 g at 07/24/20 1406  Patients Current Diet:  Diet Order            Diet regular  Room service appropriate? Yes; Fluid consistency: Thin  Diet effective now                 Precautions / Restrictions Precautions Precautions: Fall Restrictions Weight Bearing Restrictions: Yes LLE Weight Bearing: Weight bearing as tolerated   Has the patient had 2 or more falls or a fall with injury in the past year? Yes, the fall that led to this admit.   Prior Activity Level Community (5-7x/wk): very active PTA, bowling, driving, no DME used  Prior Functional Level Self Care: Did the patient need help bathing, dressing, using the toilet or eating? Independent  Indoor Mobility: Did the patient need assistance with walking from room to room (with or without device)? Independent  Stairs: Did the patient need assistance with internal or external stairs (with or without device)? Independent  Functional Cognition: Did the patient need help planning regular tasks such as shopping or remembering to take medications? Independent  Home Assistive Devices / Equipment Home Assistive Devices/Equipment: None Home Equipment: None  Prior Device Use: Indicate devices/aids used by the patient prior to current illness, exacerbation or injury? None of the above  Current Functional Level Cognition  Overall Cognitive Status: Within Functional Limits for tasks assessed Orientation Level: Oriented X4 General Comments: Difficulty following direction for mobility leading to safety concerns    Extremity Assessment (includes Sensation/Coordination)  Upper Extremity Assessment: Overall WFL for tasks assessed  Lower Extremity Assessment: LLE deficits/detail LLE: Unable to fully assess due to pain LLE Sensation: WNL LLE Coordination: decreased gross motor    ADLs       Mobility  Overal bed mobility: Needs Assistance Bed Mobility: Sit to Supine Supine to sit: Max assist, +2 for physical assistance General bed mobility comments: Pt required assistance to lower trunk and lift B LEs back to bed  against gravity.    Transfers  Overall transfer level: Needs assistance Equipment used: Ambulation equipment used (sara stedy) Transfers: Sit to/from Stand Sit to Stand: +2 physical assistance, Mod assist General transfer comment: Heavy mod +2 to rise into standing from recliner to sara stedy.  Cues for hand and foot placement.  Pre transfer assisted patient to edge of recliner to improve ease of transfer.    Ambulation / Gait / Stairs / Wheelchair Mobility  Ambulation/Gait Ambulation/Gait assistance:  (NT) Gait Distance (Feet): 3 Feet Assistive device: Rolling walker (2 wheeled) Gait Pattern/deviations: Step-to pattern, Decreased stride length, Trunk flexed General Gait Details: patient prematurely reaching for chair and letting go of walker during pivoting transfer from bed to recliner. Unsafe with mobility. Requires constant verbal cues for safety with mobility. Gait velocity: decreased  Posture / Balance Balance Overall balance assessment: Needs assistance, History of Falls Sitting-balance support: Bilateral upper extremity supported, Feet supported Sitting balance-Leahy Scale: Poor Standing balance support: Bilateral upper extremity supported, During functional activity Standing balance-Leahy Scale: Poor Standing balance comment: reliant on RW and external support.    Special needs/care consideration Oxygen on 2L in hospital, but no O2 at baseline and Designated visitor spouse, [place] (from acute therapy documentation) Living Arrangements: Spouse/significant other Available Help at Discharge: Family, Available 24 hours/day Type of Home: House Home Layout: One level Home Access: Stairs to enter [employer] of Steps: [place]: No  Discharge Living Setting Plans for Discharge Living Setting: Patient's home Type of Home at Discharge: House Discharge Home Layout: One level Discharge Home Access: Stairs to  enter Entrance Stairs-Rails: None (will install rails or ramp) Entrance Stairs-Number of Steps: 4 Discharge Bathroom Shower/Tub: Tub/shower unit Discharge Bathroom Toilet: Standard Discharge Bathroom Accessibility: Yes How Accessible: Accessible via walker Does the patient have any problems obtaining your medications?: No  Social/Family/Support Systems Patient Roles: Spouse Anticipated Caregiver: robert Godbey Anticipated Caregiver's Contact Information: [phone] Ability/Limitations of Caregiver: mod assist Caregiver Availability: 24/7 Discharge Plan Discussed with Primary Caregiver: Yes Is Caregiver In Agreement with Plan?: Yes Does Caregiver/Family have Issues with Lodging/Transportation while Pt is in Rehab?: No  Goals Patient/Family Goal for Rehab: PT/OT supervision to mod I Expected length of stay: 14-18 days Pt/Family Agrees to Admission and willing to participate: Yes Program Orientation Provided & Reviewed with Pt/Caregiver Including Roles  & Responsibilities: Yes  Barriers to Discharge: Home environment access/layout  Decrease burden of Care through IP rehab admission: n/a  Possible need for SNF placement upon discharge: not anticipated  Patient Condition: I have reviewed medical records from [facility], spoken with CM, and patient and spouse. I met with patient at the bedside for inpatient rehabilitation assessment.  Patient will benefit from ongoing PT and OT, can actively participate in 3 hours of therapy a day 5 days of the week, and can make measurable gains during the admission.  Patient will also benefit from the coordinated team approach during an Inpatient Acute Rehabilitation admission.  The patient will receive intensive therapy as well as Rehabilitation physician, nursing, social worker, and care management interventions.  Due to bladder management, bowel management, safety, skin/wound care, disease management, medication administration, pain management  and patient education the patient requires 24 hour a day rehabilitation nursing.  The patient is currently mod +2 to max +2 with mobility and basic ADLs.  Discharge setting and therapy post discharge at home with home health is anticipated.  Patient has agreed to participate in the Acute Inpatient Rehabilitation Program and will admit today.  Preadmission Screen Completed By:  Michel Santee, PT, DPT 07/25/2020 10:34 AM ______________________________________________________________________   Discussed status with Dr. Posey Pronto on 07/25/20  at 11:00 AM  and received approval for admission today.  Admission Coordinator:  Michel Santee, PT, DPT time 11:00 AM Sudie Grumbling 07/25/20    Assessment/Plan: Diagnosis: Left hip fx, s/p ORIF 1. Does the need for close, 24 hr/day Medical supervision in concert with the patient's rehab needs make it unreasonable for this patient to be served in a less intensive setting? Yes 2. Co-Morbidities requiring supervision/potential complications: pulmonary embolism, tacchycardia (EKG negative), hypoxia (improved when off IV meds), and ABLA 3. Due to bowel management, safety, skin/wound care, disease management, pain management and patient education, does the patient require 24 hr/day rehab nursing?  Yes 4. Does the patient require coordinated care of a physician, rehab nurse, PT, OT to address physical and functional deficits in the context of the above medical diagnosis(es)? Yes Addressing deficits in the following areas: balance, endurance, locomotion, strength, transferring, bathing, dressing, toileting and psychosocial support 5. Can the patient actively participate in an intensive therapy program of at least 3 hrs of therapy 5 days a week? Potentially 6. The potential for patient to make measurable gains while on inpatient rehab is excellent 7. Anticipated functional outcomes upon discharge from inpatient rehab: min assist PT, min assist OT, n/a SLP 8. Estimated rehab length  of stay to reach the above functional goals is: 18-23 days. 9. Anticipated discharge destination: Home 10. Overall Rehab/Functional Prognosis: good   MD Signature: Delice Lesch, MD, ABPMR

## 2020-07-26 ENCOUNTER — Inpatient Hospital Stay (HOSPITAL_COMMUNITY): Payer: Medicare Other

## 2020-07-26 ENCOUNTER — Inpatient Hospital Stay (HOSPITAL_COMMUNITY): Payer: Medicare Other | Admitting: Occupational Therapy

## 2020-07-26 ENCOUNTER — Inpatient Hospital Stay (HOSPITAL_COMMUNITY): Payer: Medicare Other | Admitting: Physical Therapy

## 2020-07-26 LAB — CBC WITH DIFFERENTIAL/PLATELET
Abs Immature Granulocytes: 0 10*3/uL (ref 0.00–0.07)
Basophils Absolute: 0 10*3/uL (ref 0.0–0.1)
Basophils Relative: 0 %
Eosinophils Absolute: 0.3 10*3/uL (ref 0.0–0.5)
Eosinophils Relative: 4 %
HCT: 23 % — ABNORMAL LOW (ref 36.0–46.0)
Hemoglobin: 7.4 g/dL — ABNORMAL LOW (ref 12.0–15.0)
Lymphocytes Relative: 23 %
Lymphs Abs: 2 10*3/uL (ref 0.7–4.0)
MCH: 33.8 pg (ref 26.0–34.0)
MCHC: 32.2 g/dL (ref 30.0–36.0)
MCV: 105 fL — ABNORMAL HIGH (ref 80.0–100.0)
Monocytes Absolute: 0.2 10*3/uL (ref 0.1–1.0)
Monocytes Relative: 2 %
Neutro Abs: 6 10*3/uL (ref 1.7–7.7)
Neutrophils Relative %: 71 %
Platelets: 184 10*3/uL (ref 150–400)
RBC: 2.19 MIL/uL — ABNORMAL LOW (ref 3.87–5.11)
RDW: 14.6 % (ref 11.5–15.5)
WBC: 8.5 10*3/uL (ref 4.0–10.5)
nRBC: 1 /100 WBC — ABNORMAL HIGH
nRBC: 3.2 % — ABNORMAL HIGH (ref 0.0–0.2)

## 2020-07-26 LAB — COMPREHENSIVE METABOLIC PANEL
ALT: 20 U/L (ref 0–44)
AST: 45 U/L — ABNORMAL HIGH (ref 15–41)
Albumin: 2.3 g/dL — ABNORMAL LOW (ref 3.5–5.0)
Alkaline Phosphatase: 46 U/L (ref 38–126)
Anion gap: 6 (ref 5–15)
BUN: 23 mg/dL (ref 8–23)
CO2: 26 mmol/L (ref 22–32)
Calcium: 8 mg/dL — ABNORMAL LOW (ref 8.9–10.3)
Chloride: 101 mmol/L (ref 98–111)
Creatinine, Ser: 1.01 mg/dL — ABNORMAL HIGH (ref 0.44–1.00)
GFR calc Af Amer: 59 mL/min — ABNORMAL LOW (ref >60)
GFR calc non Af Amer: 51 mL/min — ABNORMAL LOW (ref >60)
Glucose, Bld: 115 mg/dL — ABNORMAL HIGH (ref 70–99)
Potassium: 4.1 mmol/L (ref 3.5–5.1)
Sodium: 133 mmol/L — ABNORMAL LOW (ref 135–145)
Total Bilirubin: 1.2 mg/dL (ref 0.3–1.2)
Total Protein: 4.9 g/dL — ABNORMAL LOW (ref 6.5–8.1)

## 2020-07-26 LAB — MAGNESIUM: Magnesium: 2.1 mg/dL (ref 1.7–2.4)

## 2020-07-26 LAB — PATHOLOGIST SMEAR REVIEW: Path Review: INCREASED

## 2020-07-26 NOTE — Evaluation (Signed)
Occupational Therapy Assessment and Plan  Patient Details  Name: Stacy Valencia MRN: [id number] Date of Birth: 09/05/1933  OT Diagnosis: acute pain and muscle weakness (generalized) Rehab Potential: Rehab Potential (ACUTE ONLY): Good ELOS: 17-21 days   Today's Date: 07/26/2020 OT Individual Time: 7939-0300 OT Individual Time Calculation (min): 58 min     Hospital Problem: Principal Problem:   Hip fracture ([place]) Active Problems:   Femur fracture, left (HCC)   Past Medical History:  Past Medical History:  Diagnosis Date  . GERD (gastroesophageal reflux disease)   . PE (pulmonary embolism) 2005   Past Surgical History:  Past Surgical History:  Procedure Laterality Date  . ABDOMINAL HYSTERECTOMY    . APPENDECTOMY    . arthroscopic shoulder surgery Right 10/28/2016   decompression of SAD/DCR  . INTRAMEDULLARY (IM) NAIL INTERTROCHANTERIC Left 07/23/2020   Procedure: INTRAMEDULLARY (IM) NAIL INTERTROCHANTRIC HIP;  Surgeon: Rod Can, MD;  Location: [facility];  Service: Orthopedics;  Laterality: Left;    Assessment & Plan Clinical Impression: Patient is a 86 y.o. year old female with recent admission to the hospital on 07/22/2020 after a fall with resulting left hip pain. History taken from chart review, husband, and patient. She was found to have comminuted, mildly displaced angulated IT proximal left femur fracture. She was evaluated by Dr. Lyla Glassing and underwent IM nail fixation of left femur on 07/23/2020. Postop to be WBAT and Xarelto resumed on 08/03. Hospital course further complicated by hypoxia and tachycardia due to to IV narcotics. She required 4 L of supplemental oxygen as well as IV metoprolol for rate control and has been transitioned to low-dose p.o. metoprolol . Mentation improved off Dilaudid and pain currently controlled on hydrocodone as needed. Follow-up labs showed acute blood loss anemia with H&H down to 8.2/25.6 as well as hyponatremia, with acute renal failure --BUN/SCr  26/1.30. Therapy ongoing and patient working on pregait activity in [employer] lift. Patient transferred to CIR on 07/25/2020 .    Patient currently requires max with basic self-care skills secondary to muscle weakness and muscle joint tightness, decreased cardiorespiratoy endurance and decreased sitting balance, decreased standing balance and decreased balance strategies.  Prior to hospitalization, patient could complete BADL with independent .  Patient will benefit from skilled intervention to increase independence with basic self-care skills prior to discharge home with care partner.  Anticipate patient will require 24 hour supervision and minimal physical assistance and follow up home health.  OT - End of Session Endurance Deficit: Yes Endurance Deficit Description: rest breaks within BADL tasks OT Assessment Rehab Potential (ACUTE ONLY): Good OT Patient demonstrates impairments in the following area(s): Balance;Endurance;Motor;Pain OT Basic ADL's Functional Problem(s): Grooming;Bathing;Dressing;Toileting OT Transfers Functional Problem(s): Toilet;Tub/Shower OT Additional Impairment(s): None OT Plan OT Intensity: Minimum of 1-2 x/day, 45 to 90 minutes OT Frequency: 5 out of 7 days OT Duration/Estimated Length of Stay: 17-21 days OT Treatment/Interventions: Medical illustrator training;Community reintegration;Discharge planning;Disease mangement/prevention;DME/adaptive equipment instruction;Functional mobility training;Pain management;Patient/family education;Self Care/advanced ADL retraining;Therapeutic Activities;Therapeutic Exercise;UE/LE Strength taining/ROM;Wheelchair propulsion/positioning OT Basic Self-Care Anticipated Outcome(s): Supervision/min A OT Toileting Anticipated Outcome(s): Min iA OT Bathroom Transfers Anticipated Outcome(s): Supervision/min A OT Recommendation Patient destination: Home Follow Up Recommendations: Home health OT Equipment Recommended: To be determined   OT  Evaluation Precautions/Restrictions  Precautions Precautions: Fall Restrictions Weight Bearing Restrictions: Yes LLE Weight Bearing: Weight bearing as tolerated Pain Pain Assessment Pain Scale: 0-10 Pain Type: Acute pain;Surgical pain Pain Location: Hip Pain Orientation: Left Pain Descriptors / Indicators: Aching Pain Frequency: Intermittent Pain Onset: On-going Patients Stated  Pain Goal: 3 Pain Intervention(s): Repositioned Multiple Pain Sites: No Home Living/Prior Functioning Home Living Family/patient expects to be discharged to:: Private residence Living Arrangements: Spouse/significant other Available Help at Discharge: Family, Available 24 hours/day Type of Home: House Home Access: Stairs to enter Technical brewer of Steps: 3 Entrance Stairs-Rails: Left Home Layout: One level Bathroom Shower/Tub: Multimedia programmer: Handicapped height Bathroom Accessibility: Yes  Lives With: Spouse IADL History Homemaking Responsibilities: Yes Leisure and Hobbies: Loves bowling, bowls once a week at [employer] IADL Comments: Has cleaning service that comes 1x a month, husband cooks Prior Function Level of Independence: Independent with basic ADLs, Independent with homemaking with ambulation  Able to Take Stairs?: Yes Driving: Yes Vocation: Retired Comments: patient bowls, very active at baseline Vision Baseline Vision/History: No visual deficits Patient Visual Report: No change from baseline Vision Assessment?: No apparent visual deficits Perception  Perception: Within Functional Limits Praxis Praxis: Intact Cognition Overall Cognitive Status: Within Functional Limits for tasks assessed Arousal/Alertness: Awake/alert Orientation Level: Person;Place;Situation Person: Oriented Place: Oriented Situation: Oriented Year: 2021 Month: August Day of Week: Correct Memory: Appears intact Immediate Memory Recall: Sock;Blue;Bed Memory Recall Sock: Without  Cue Memory Recall Blue: Without Cue Memory Recall Bed: Without Cue Focused Attention: Appears intact Problem Solving: Appears intact Safety/Judgment: Appears intact Sensation Sensation Light Touch: Appears Intact Coordination Gross Motor Movements are Fluid and Coordinated: No Fine Motor Movements are Fluid and Coordinated: Yes Coordination and Movement Description: pain and weakness limiting coordinaiton on the L Heel Shin Test: unable to perform due to pain Motor  Motor Motor: Other (comment) Motor - Skilled Clinical Observations: pain and generalized weakness  Balance Balance Balance Assessed: Yes Static Sitting Balance Static Sitting - Level of Assistance: 4: Min assist Dynamic Sitting Balance Dynamic Sitting - Level of Assistance: 3: Mod assist Extremity/Trunk Assessment RUE Assessment RUE Assessment: Within Functional Limits LUE Assessment LUE Assessment: Within Functional Limits  Care Tool Care Tool Self Care Eating   Eating Assist Level: Set up assist    Oral Care    Oral Care Assist Level: Set up assist    Bathing   Body parts bathed by patient: Right arm;Left arm;Chest;Abdomen;Front perineal area;Right upper leg;Left upper leg;Face Body parts bathed by helper: Left lower leg;Right lower leg;Buttocks   Assist Level: Maximal Assistance - Patient 24 - 49%    Upper Body Dressing(including orthotics)   What is the patient wearing?: Pull over shirt   Assist Level: Moderate Assistance - Patient 50 - 74%    Lower Body Dressing (excluding footwear)   What is the patient wearing?: Pants Assist for lower body dressing: Total Assistance - Patient < 25%    Putting on/Taking off footwear   What is the patient wearing?: Non-skid slipper socks Assist for footwear: Dependent - Patient 0%       Care Tool Toileting Toileting activity   Assist for toileting: Total Assistance - Patient < 25%     Care Tool Bed Mobility Roll left and right activity   Roll left and  right assist level: Maximal Assistance - Patient 25 - 49%    Sit to lying activity   Sit to lying assist level: Maximal Assistance - Patient 25 - 49%    Lying to sitting edge of bed activity   Lying to sitting edge of bed assist level: Maximal Assistance - Patient 25 - 49%     Care Tool Transfers Sit to stand transfer Sit to stand activity did not occur: Safety/medical concerns Sit to stand  assist level: Maximal Assistance - Patient 25 - 49%    Chair/bed transfer   Chair/bed transfer assist level: Maximal Assistance - Patient 25 - 49%     Toilet transfer Toilet transfer activity did not occur: Safety/medical concerns       Care Tool Cognition Expression of Ideas and Wants Expression of Ideas and Wants: Without difficulty (complex and basic) - expresses complex messages without difficulty and with speech that is clear and easy to understand   Understanding Verbal and Non-Verbal Content Understanding Verbal and Non-Verbal Content: Understands (complex and basic) - clear comprehension without cues or repetitions   Memory/Recall Ability *first 3 days only Memory/Recall Ability *first 3 days only: Current season;Staff names and faces;That he or she is in a hospital/hospital unit;Location of own room    Refer to Care Plan for Long Term Goals  SHORT TERM GOAL WEEK 1 OT Short Term Goal 1 (Week 1): Pt will complete sit<>stand with max A of 1 OT Short Term Goal 2 (Week 1): Pt will tolerate sitting EOB for 5 mins with no more than min A OT Short Term Goal 3 (Week 1): Pt will complete toilet transfer with max A of 1  Recommendations for other services: None    Skilled Therapeutic Intervention Pt greeted semi-reclined in bed and agreeable to OT eval and treat. Pt reported she did not ask for pain medicine this am, but should probably have some prior to therapy. Nursing notified, but pt did not receive pain mes. Pt still willing to do as much as she could to pain tolerance. Pt reported need  to urinate and pt assisted with female urinal placement, but needed OT assist to maintain position. Pt with void of small amount of urine. Pt then was able to reach and complete peri-care with HOB elevated. Rolling to change brief with max A and increased pain with movement.  Pt needed Max A for LB bathing at bed level and total A for LB dressing at bed level. Max A for rolling to then pull up pants. Pt needed extended rest break to let pain subside, then she was agreeable to sit EOB. Pt needed max A to come to sitting. Once in sitting, she needed min A for balance during UB ADLs. Mod A to don shirt in sitting 2/2 balance. Pt able to laterally scoot towards HOB, then max A to return to supine. OT addressed rehab process, OT purpose, POC, and goals.  Pt left semi-reclined in bed with bed alarm on, call bell in reach, and needs met.   ADL ADL Eating: Set up Grooming: Setup Upper Body Bathing: Minimal assistance Lower Body Bathing: Maximal assistance Upper Body Dressing: Moderate assistance Lower Body Dressing: Dependent Toileting: Maximal assistance Mobility  Bed Mobility Bed Mobility: Sit to Supine;Supine to Sit Supine to Sit: Maximal Assistance - Patient - Patient 25-49% Sit to Supine: Maximal Assistance - Patient 25-49% Transfers Sit to Stand: Moderate Assistance - Patient 50-74% (elevated bed height and cues for UE to push from sitting surface)   Discharge Criteria: Patient will be discharged from OT if patient refuses treatment 3 consecutive times without medical reason, if treatment goals not met, if there is a change in medical status, if patient makes no progress towards goals or if patient is discharged from hospital.  The above assessment, treatment plan, treatment alternatives and goals were discussed and mutually agreed upon: by patient  Valma Cava 07/26/2020, 3:38 PM

## 2020-07-26 NOTE — Plan of Care (Signed)
  Problem: RH Balance Goal: LTG Patient will maintain dynamic sitting balance (PT) Description: LTG:  Patient will maintain dynamic sitting balance with assistance during mobility activities (PT) Flowsheets (Taken 07/26/2020 1506) LTG: Pt will maintain dynamic sitting balance during mobility activities with:: Independent with assistive device  Goal: LTG Patient will maintain dynamic standing balance (PT) Description: LTG:  Patient will maintain dynamic standing balance with assistance during mobility activities (PT) Flowsheets (Taken 07/26/2020 1506) LTG: Pt will maintain dynamic standing balance during mobility activities with:: Supervision/Verbal cueing   Problem: Sit to Stand Goal: LTG:  Patient will perform sit to stand with assistance level (PT) Description: LTG:  Patient will perform sit to stand with assistance level (PT) Flowsheets (Taken 07/26/2020 1506) LTG: PT will perform sit to stand in preparation for functional mobility with assistance level: Supervision/Verbal cueing   Problem: RH Bed Mobility Goal: LTG Patient will perform bed mobility with assist (PT) Description: LTG: Patient will perform bed mobility with assistance, with/without cues (PT). Flowsheets (Taken 07/26/2020 1506) LTG: Pt will perform bed mobility with assistance level of: Minimal Assistance - Patient > 75%   Problem: RH Bed to Chair Transfers Goal: LTG Patient will perform bed/chair transfers w/assist (PT) Description: LTG: Patient will perform bed to chair transfers with assistance (PT). Flowsheets (Taken 07/26/2020 1506) LTG: Pt will perform Bed to Chair Transfers with assistance level: Supervision/Verbal cueing   Problem: RH Car Transfers Goal: LTG Patient will perform car transfers with assist (PT) Description: LTG: Patient will perform car transfers with assistance (PT). Flowsheets (Taken 07/26/2020 1506) LTG: Pt will perform car transfers with assist:: Minimal Assistance - Patient > 75%   Problem: RH  Ambulation Goal: LTG Patient will ambulate in controlled environment (PT) Description: LTG: Patient will ambulate in a controlled environment, # of feet with assistance (PT). Flowsheets (Taken 07/26/2020 1506) LTG: Pt will ambulate in controlled environ  assist needed:: Supervision/Verbal cueing LTG: Ambulation distance in controlled environment: 36ft with LRAD Goal: LTG Patient will ambulate in home environment (PT) Description: LTG: Patient will ambulate in home environment, # of feet with assistance (PT). Flowsheets (Taken 07/26/2020 1506) LTG: Pt will ambulate in home environ  assist needed:: Supervision/Verbal cueing LTG: Ambulation distance in home environment: 61ft with LRAD   Problem: RH Wheelchair Mobility Goal: LTG Patient will propel w/c in controlled environment (PT) Description: LTG: Patient will propel wheelchair in controlled environment, # of feet with assist (PT) Flowsheets (Taken 07/26/2020 1506) LTG: Pt will propel w/c in controlled environ  assist needed:: Supervision/Verbal cueing LTG: Propel w/c distance in controlled environment: 179ft Goal: LTG Patient will propel w/c in home environment (PT) Description: LTG: Patient will propel wheelchair in home environment, # of feet with assistance (PT). Flowsheets (Taken 07/26/2020 1506) LTG: Propel w/c distance in home environment: 58ft   Problem: RH Stairs Goal: LTG Patient will ambulate up and down stairs w/assist (PT) Description: LTG: Patient will ambulate up and down # of stairs with assistance (PT) Flowsheets (Taken 07/26/2020 1506) LTG: Pt will ambulate up/down stairs assist needed:: Minimal Assistance - Patient > 75% LTG: Pt will  ambulate up and down number of stairs: 3 steps with 1 rail

## 2020-07-26 NOTE — Progress Notes (Signed)
Inpatient [facility] Individual Statement of Services  Patient Name:  Stacy Valencia  Date:  07/26/2020  Welcome to the [facility].  Our goal is to provide you with an individualized program based on your diagnosis and situation, designed to meet your specific needs.  With this comprehensive rehabilitation program, you will be expected to participate in at least 3 hours of rehabilitation therapies Monday-Friday, with modified therapy programming on the weekends.  Your rehabilitation program will include the following services:  Physical Therapy (PT), Occupational Therapy (OT), 24 hour per day rehabilitation nursing, Therapeutic Recreation (TR), Neuropsychology, Care Coordinator, Rehabilitation Medicine, Nutrition Services and Pharmacy Services  Weekly team conferences will be held on Tuesdays to discuss your progress.  Your Inpatient Rehabilitation Care Coordinator will talk with you frequently to get your input and to update you on team discussions.  Team conferences with you and your family in attendance may also be held.  Expected length of stay: 14-18 days Overall anticipated outcome: Supervision - Minimal assistance  Depending on your progress and recovery, your program may change. Your Inpatient Rehabilitation Care Coordinator will coordinate services and will keep you informed of any changes. Your Inpatient Rehabilitation Care Coordinator's name and contact numbers are listed  below.  The following services may also be recommended but are not provided by the [facility]:    [place] will be made to provide these services after discharge if needed.  Arrangements include referral to agencies that provide these services.  Your insurance has been verified to be:  Medicare A+B/[employer]na Your primary doctor is:  London Pepper, MD  Pertinent information will be shared  with your doctor and your insurance company.  Inpatient Rehabilitation Care Coordinator:  Cathleen Corti [phone] or ([phone]  Information discussed with and copy given to patient by: Margarito Liner, 07/26/2020, 12:34 PM

## 2020-07-26 NOTE — Progress Notes (Signed)
   Patient Details  Name: Stacy Valencia MRN: [id number] Date of Birth: 01-18-1933  Today's Date: 07/26/2020  Hospital Problems: Principal Problem:   Hip fracture [facility]) Active Problems:   Femur fracture, left [facility])  Past Medical History:  Past Medical History:  Diagnosis Date  . GERD (gastroesophageal reflux disease)   . PE (pulmonary embolism) 2005   Past Surgical History:  Past Surgical History:  Procedure Laterality Date  . ABDOMINAL HYSTERECTOMY    . APPENDECTOMY    . arthroscopic shoulder surgery Right 10/28/2016   decompression of SAD/DCR  . INTRAMEDULLARY (IM) NAIL INTERTROCHANTERIC Left 07/23/2020   Procedure: INTRAMEDULLARY (IM) NAIL INTERTROCHANTRIC HIP;  Surgeon: Rod Can, MD;  Location: [facility];  Service: Orthopedics;  Laterality: Left;   Social History:  reports that she has never smoked. She has never used smokeless tobacco. She reports that she does not drink alcohol and does not use drugs.  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) Read: Yes Write: Yes Employment Status: Retired   Abuse/Neglect Abuse/Neglect Assessment Can Be Completed: Yes Physical Abuse: Denies Verbal Abuse: Denies Sexual Abuse: Denies Exploitation of patient/patient's resources: Denies Self-Neglect: Denies  Emotional Status Pt's affect, behavior and adjustment status: Normal mood, behavior and adjustment  Patient / Family Perceptions, Expectations & Goals Pt/Family understanding of illness & functional limitations: Patient and spouse appear to have a reasonable understanding of current health status and  refer to daughter who they report has a good understanding of the situation Premorbid pt/family roles/activities: Independent prior to admission Anticipated changes in roles/activities/participation: Anticipate patient will need assistance at discharge Pt/family expectations/goals: Patient would like to be as independent as possible and complete on ADLs  [employer]: Other (Comment) (Declines [facility]-previous negative experience) Premorbid Home Care/DME Agencies: Other (Comment) (ADA toilets in bathroom) Transportation available at discharge: Spouse or daughter will manage transportation at discharge Resource referrals recommended: Neuropsychology  Discharge Planning Living Arrangements: Spouse/significant other Support Systems: Spouse/significant other, Children Type of Residence: Private residence Insurance Resources: Medicare Financial Screen Referred: No Living Expenses: Own Money Management: Patient Does the patient have any problems obtaining your medications?: No Home Management: Spouse will manage the home; cleaning person comes weekly, spouse orders groceries for pick up Patient/Family Preliminary Plans: Discharge home with husband and daughter will assist as needed Care Coordinator Barriers to Discharge: Home environment access/layout Care Coordinator Barriers to Discharge Comments: 3 step entry to home right rail in garage and front 3 steps without rails; willing to install ramp if necessary Care Coordinator Anticipated Follow Up Needs: HH/OP Expected length of stay: 14-18 days  Clinical Impression Very polite couple. Patient reports she would like to be able to do for herself as much as possible. Spouse notes steps to entry of the home but is willing to do what is necessary to take care of his wife. Reported good understanding of the situation and has resources to help with care at discharge.   Dorien Chihuahua B 07/26/2020, 4:10 PM

## 2020-07-26 NOTE — Progress Notes (Signed)
Physical Therapy Session Note  Patient Details  Name: Stacy Valencia MRN: [id number] Date of Birth: 08-21-1933  Today's Date: 07/26/2020 PT Individual Time: 1402-1510 PT Individual Time Calculation (min): 68 min   Short Term Goals: Week 1:     Skilled Therapeutic Interventions/Progress Updates:     Pt received seated in WC and agreeable to therapy. Reports pain in L hip and knee. Number not provided. PT provides multiple extended rest breaks to manage pain. WC transport to gym for time management.  Pt performs x2 sit to stand reps in // bars with modA from PT to stand, and CGA/minA to maintain standing balance. Pt practices weight shifting from R to L. No buckling noted but pt very verbal about increase in pain, especially when returning to seated position.  Stand pivot transfer to mat table with heavy modA. Sit to supine with modA. Pt performs supine therex with extended rest breaks for pain control, including x15 quad sets and x15 hip abduction AAROM. Sit to supine with modA. SPT back to WC with modA HHA. Left seated in WC with all needs within reach.  Therapy Documentation Precautions:  Precautions Precautions: Fall Restrictions Weight Bearing Restrictions: Yes LLE Weight Bearing: Weight bearing as tolerated   Therapy/Group: Individual Therapy  Breck Coons, PT, DPT 07/26/2020, 2:45 PM

## 2020-07-26 NOTE — Evaluation (Signed)
Physical Therapy Assessment and Plan  Patient Details  Name: Stacy Valencia MRN: [id number] Date of Birth: 1933/02/01  PT Diagnosis: Difficulty walking, Muscle weakness and Pain in joint Rehab Potential: Good ELOS: 17-21 days   Today's Date: 07/26/2020 PT Individual Time: 1104-1200 PT Individual Time Calculation (min): 56 min    Hospital Problem: Principal Problem:   Hip fracture ([place]) Active Problems:   Femur fracture, left ([place])   Past Medical History:  Past Medical History:  Diagnosis Date  . GERD (gastroesophageal reflux disease)   . PE (pulmonary embolism) 2005   Past Surgical History:  Past Surgical History:  Procedure Laterality Date  . ABDOMINAL HYSTERECTOMY    . APPENDECTOMY    . arthroscopic shoulder surgery Right 10/28/2016   decompression of SAD/DCR  . INTRAMEDULLARY (IM) NAIL INTERTROCHANTERIC Left 07/23/2020   Procedure: INTRAMEDULLARY (IM) NAIL INTERTROCHANTRIC HIP;  Surgeon: Rod Can, MD;  Location: [place];  Service: Orthopedics;  Laterality: Left;    Assessment & Plan Clinical Impression: Patient is a 86 year old female with history of PE/DVT--chronic AC (per Dr. Alen Blew), GERD who was admitted on 07/22/2020 after a fall with resulting left hip pain.  History taken from chart review, husband, and patient. She was found to have comminuted, mildly displaced angulated IT proximal left femur fracture.  She was evaluated by Dr. Lyla Glassing and underwent IM nail fixation of left femur on 07/23/2020.  Postop to be WBAT and Xarelto resumed on 08/03.  Hospital course further complicated by hypoxia and tachycardia due to to IV narcotics.  She required 4 L of supplemental oxygen as well as IV metoprolol for rate control and has been transitioned to low-dose p.o. metoprolol .  Mentation improved off Dilaudid and pain currently controlled on hydrocodone as needed. Follow-up labs showed acute blood loss anemia with H&H down to 8.2/25.6 as well as hyponatremia, with acute renal failure  --BUN/SCr 26/1.30. Patient transferred to CIR on 07/25/2020 .   Patient currently requires max with mobility secondary to muscle weakness and muscle joint tightness, decreased cardiorespiratoy endurance and decreased sitting balance, decreased standing balance and difficulty maintaining precautions.  Prior to hospitalization, patient was independent  with mobility and lived with Spouse in a House home.  Home access is 3Stairs to enter.  Patient will benefit from skilled PT intervention to maximize safe functional mobility, minimize fall risk and decrease caregiver burden for planned discharge home with 24 hour supervision.  Anticipate patient will benefit from follow up [place] at discharge.  PT - End of Session Activity Tolerance: Tolerates < 10 min activity, no significant change in vital signs Endurance Deficit: Yes Endurance Deficit Description: rest breaks within BADL tasks PT Assessment Rehab Potential (ACUTE/IP ONLY): Good PT Barriers to Discharge: Inaccessible home environment;Home environment access/layout;Weight;Medication compliance;Behavior PT Patient demonstrates impairments in the following area(s): Balance;Behavior;Edema;Endurance;Pain;Safety;Skin Integrity PT Transfers Functional Problem(s): Bed Mobility;Bed to Chair;Car;Furniture;Floor;Other (comment) PT Locomotion Functional Problem(s): Ambulation;Wheelchair Mobility;Stairs PT Plan PT Intensity: Minimum of 1-2 x/day ,45 to 90 minutes PT Frequency: 5 out of 7 days PT Duration Estimated Length of Stay: 17-21 days PT Treatment/Interventions: Ambulation/gait training;Community reintegration;DME/adaptive equipment instruction;Neuromuscular re-education;Psychosocial support;Stair training;UE/LE Strength taining/ROM;Wheelchair propulsion/positioning;UE/LE Coordination activities;Therapeutic Activities;Skin care/wound management;Pain management;Functional electrical stimulation;Discharge planning;Balance/vestibular training;Cognitive  remediation/compensation;Disease management/prevention;Functional mobility training;Patient/family education;Splinting/orthotics;Therapeutic Exercise;Visual/perceptual remediation/compensation PT Transfers Anticipated Outcome(s): supervision assist with LRAD PT Locomotion Anticipated Outcome(s): ambulatory at house hold distance with supervision assist PT Recommendation Follow Up Recommendations: Home health PT Patient destination: Home Equipment Recommended: To be determined;Rolling walker with 5" wheels;Wheelchair (measurements);Wheelchair cushion (measurements)  PT Evaluation Precautions/Restrictions Precautions Precautions: Fall Restrictions Weight Bearing Restrictions: Yes LLE Weight Bearing: Weight bearing as tolerated General   Vital Signs Pain   10/10 L hip. With activity. Pt repositioned  Home Living/Prior Functioning Home Living Available Help at Discharge: Family;Available 24 hours/day Type of Home: House Home Access: Stairs to enter [employer] of Steps: 3 Entrance Stairs-Rails: Left Home Layout: One level Bathroom Shower/Tub: Multimedia programmer: Handicapped height Bathroom Accessibility: Yes  Lives With: Spouse Prior Function Level of Independence: Independent with basic ADLs;Independent with homemaking with ambulation  Able to Take Stairs?: Yes Driving: Yes Vocation: Retired Comments: patient bowls, very active at baseline Vision/Perception  Perception Perception: Within Functional Limits Praxis Praxis: Intact  Cognition Overall Cognitive Status: Within Functional Limits for tasks assessed Arousal/Alertness: Awake/alert Focused Attention: Appears intact Memory: Appears intact Immediate Memory Recall: Sock;Blue;Bed Memory Recall Sock: Without Cue Memory Recall Blue: Without Cue Memory Recall Bed: Without Cue Problem Solving: Appears intact Safety/Judgment: Appears intact Sensation Sensation Light Touch: Appears  Intact Coordination Gross Motor Movements are Fluid and Coordinated: No Fine Motor Movements are Fluid and Coordinated: Yes Coordination and Movement Description: pain and weakness limiting coordinaiton on the L Heel Shin Test: unable to perform due to pain Motor  Motor Motor: Other (comment) Motor - Skilled Clinical Observations: pain and generalized weakness   Trunk/Postural Assessment  Cervical Assessment Cervical Assessment: Within Functional Limits Thoracic Assessment Thoracic Assessment: Within Functional Limits Lumbar Assessment Lumbar Assessment: Within Functional Limits Postural Control Postural Control: Within Functional Limits  Balance Balance Balance Assessed: Yes Static Sitting Balance Static Sitting - Level of Assistance: 4: Min assist Dynamic Sitting Balance Dynamic Sitting - Level of Assistance: 4: Min assist Static Standing Balance Static Standing - Balance Support: Bilateral upper extremity supported Static Standing - Level of Assistance: 3: Mod assist;4: Min assist Static Standing - Comment/# of Minutes: standing with RW Dynamic Standing Balance Dynamic Standing - Balance Support: Bilateral upper extremity supported Dynamic Standing - Level of Assistance: 2: Max assist Dynamic Standing - Comments: transfer to [facility] Extremity Assessment  RUE Assessment RUE Assessment: Within Functional Limits LUE Assessment LUE Assessment: Within Functional Limits      Care Tool Care Tool Bed Mobility Roll left and right activity   Roll left and right assist level: Maximal Assistance - Patient 25 - 49%    Sit to lying activity   Sit to lying assist level: Maximal Assistance - Patient 25 - 49%    Lying to sitting edge of bed activity   Lying to sitting edge of bed assist level: Maximal Assistance - Patient 25 - 49%     Care Tool Transfers Sit to stand transfer   Sit to stand assist level: Maximal Assistance - Patient 25 - 49%    Chair/bed transfer   Chair/bed  transfer assist level: Maximal Assistance - Patient 25 - 49%     Psychologist, counselling transfer activity did not occur: Safety/medical concerns        Care Tool Locomotion Ambulation   Assist level: Maximal Assistance - Patient 25 - 49% Assistive device: Walker-rolling Max distance: 3  Walk 10 feet activity Walk 10 feet activity did not occur: Safety/medical concerns       Walk 50 feet with 2 turns activity Walk 50 feet with 2 turns activity did not occur: Safety/medical concerns      Walk 150 feet activity Walk 150 feet activity did not occur: Safety/medical concerns  Walk 10 feet on uneven surfaces activity Walk 10 feet on uneven surfaces activity did not occur: Safety/medical concerns      Stairs Stair activity did not occur: Safety/medical concerns        Walk up/down 1 step activity Walk up/down 1 step or curb (drop down) activity did not occur: Safety/medical concerns     Walk up/down 4 steps activity did not occuR: Safety/medical concerns  Walk up/down 4 steps activity      Walk up/down 12 steps activity Walk up/down 12 steps activity did not occur: Safety/medical concerns      Pick up small objects from floor Pick up small object from the floor (from standing position) activity did not occur: Safety/medical concerns      Wheelchair       Wheelchair assist level: Supervision/Verbal cueing Max wheelchair distance: 50  Wheel 50 feet with 2 turns activity   Assist Level: Supervision/Verbal cueing  Wheel 150 feet activity   Assist Level: Moderate Assistance - Patient 50 - 74%    Refer to Care Plan for Long Term Goals  SHORT TERM GOAL WEEK 1 PT Short Term Goal 1 (Week 1): pt will perform bed mobility with mod assist PT Short Term Goal 2 (Week 1): Pt will ambulate 43f with mod assist and LRAD PT Short Term Goal 3 (Week 1): Pt will propell WC x 1080fwith supervision assist PT Short Term Goal 4 (Week 1): pt will [facility]  mod assist  Recommendations for other services: Therapeutic Recreation  Stress management and Outing/community reintegration  Skilled Therapeutic Intervention Mobility Bed Mobility Bed Mobility: Sit to Supine;Supine to Sit Supine to Sit: Maximal Assistance - Patient - Patient 25-49% Sit to Supine: Maximal Assistance - Patient 25-49% Transfers Transfers: Sit to Stand;Stand Pivot Transfers Sit to Stand: Moderate Assistance - Patient 50-74% (elevated bed height and cues for UE to push from sitting surface) Stand Pivot Transfers: Maximal Assistance - Patient 25 - 49% Stand Pivot Transfer Details: Verbal cues for sequencing;Verbal cues for technique;Verbal cues for precautions/safety Stand Pivot Transfer Details (indicate cue type and reason): max cues for proper use of UE on RW in turn Locomotion  Gait Ambulation: Yes Gait Assistance: Maximal Assistance - Patient 25-49% Gait Distance (Feet): 3 Feet Assistive device: Rolling walker Gait Gait: Yes Gait Pattern: Impaired Gait Pattern: Antalgic Gait velocity: pt noted to be hesatint to stand on LLE, but no knee instability noted with RLE advancement Stairs / Additional Locomotion Stairs: No Wheelchair Mobility Wheelchair Mobility: Yes Wheelchair Assistance: SuChartered loss adjusterBoth upper extremities Wheelchair Parts Management: Needs assistance   Pt received supine in bed and agreeable to PT. Pt obtained 22x18 WC due to habitus and improved sitting tolerance and to prevent pressure on lateral aspect of hips.  Supine>sit transfer with max assist as listed. Sitting balance EOB with min assist  Progressing to supervision assist and elevated height due to pain in the L hip. Sit<>stand with mod assist from elevated surface and L knee blocked, but no instability in the knee. Stand pivot transfer with max assist due to poor UE placement and fear of standing on the LLE to advance the RLE. WC propulsion as stated in  hall. PT educated pt on stair paccess and car transfers, but unable to complete due to pain and fear of falling at this time. Patient returned to room and [facility] in reach and all needs met.    Orthostatic VS.  Sitting:  132/52, HR 75, SpO2 96.  Standing 124/60, HR 98, SpO2 95%   Discharge Criteria: Patient will be discharged from PT if patient refuses treatment 3 consecutive times without medical reason, if treatment goals not met, if there is a change in medical status, if patient makes no progress towards goals or if patient is discharged from hospital.  The above assessment, treatment plan, treatment alternatives and goals were discussed and mutually agreed upon: by patient  Lorie Phenix 07/26/2020, 1:06 PM

## 2020-07-26 NOTE — Progress Notes (Signed)
Inpatient Rehabilitation  Patient information reviewed and entered into eRehab system by Paisely Brick M. Sharnita Bogucki, M.A., CCC/SLP, PPS Coordinator.  Information including medical coding, functional ability and quality indicators will be reviewed and updated through discharge.    

## 2020-07-26 NOTE — Progress Notes (Signed)
Pt refused scheduled lopressor for HR. Pt stated her BP and HR are fine and she doesn't need said med for it. Pt was educated on medication compliance

## 2020-07-27 ENCOUNTER — Inpatient Hospital Stay (HOSPITAL_COMMUNITY): Payer: Medicare Other | Admitting: Occupational Therapy

## 2020-07-27 ENCOUNTER — Inpatient Hospital Stay (HOSPITAL_COMMUNITY): Payer: Medicare Other | Admitting: Physical Therapy

## 2020-07-27 DIAGNOSIS — S72002D Fracture of unspecified part of neck of left femur, subsequent encounter for closed fracture with routine healing: Principal | ICD-10-CM

## 2020-07-27 MED ORDER — SENNA 8.6 MG PO TABS
2.0000 | ORAL_TABLET | Freq: Every day | ORAL | Status: DC
  Administered 2020-07-27 – 2020-08-09 (×12): 17.2 mg via ORAL
  Filled 2020-07-27 (×14): qty 2

## 2020-07-27 NOTE — Progress Notes (Signed)
Occupational Therapy Session Note  Patient Details  Name: Stacy Valencia MRN: [id number] Date of Birth: Apr 11, 1933  Today's Date: 07/27/2020 OT Individual Time: 7169-6789 and 3810-1751 OT Individual Time Calculation (min): 54 min and 75 min   Short Term Goals: Week 1:  OT Short Term Goal 1 (Week 1): Pt will complete sit<>stand with max A of 1 OT Short Term Goal 2 (Week 1): Pt will tolerate sitting EOB for 5 mins with no more than min A OT Short Term Goal 3 (Week 1): Pt will complete toilet transfer with max A of 1  Skilled Therapeutic Interventions/Progress Updates:    1) Treatment session with focus on self-care retraining and sit > stand as needed for LB dressing.  Pt received upright in w/c after finishing PT session, reporting fatigue but agreeable to therapy session.  Engaged in grooming tasks at seated level at sink with setup assist.  Pt's husband arrived with personal clothing, therefore pt agreeable to dressing this session.  Pt donned shirt with setup assist.  Therapist educated on dressing LLE first to increase success, however still required total assist when threading BLE and then to pull pants over hips.  Pt able to complete sit > stand from w/c with min assist and min cues for hand placement and sequencing.  Pt extremely fearful with sit <> stand, therefore requiring prolonged rest break between standing to doff hospital pants and standing to don personal pants.  Pt remained upright in w/c with seat belt alarm on and all needs in reach.  2) Treatment session with focus on activity tolerance, sit <> stand, and stand pivot transfers.  Pt received supine in bed hesitant to complete therapy session, but ultimately agreeable.  Pt completed bed mobility with increased time and assist to advance LLE towards EOB and then mod assist to come to sitting EOB.  Pt required increased time and encouragement to complete sit > stand from EOB, only requiring min assist with RW.  Completed stand pivot  transfer bed > w/c with min assist and cues for hand placement during stand > sit.  Engaged in 8 mins on UBE with focus on endurance and UB strengthening, completing 4 mins forward and 4 mins backward on resistance level 2.0.  Pt reports tenderness in Rt shoulder at end of session.  Positioned pt to engage in sit > stand at parallel bars when she reports need to toilet.  Returned to room and completed sit > stand and stand pivot transfer to [facility] with min assist.  Therapist completed clothing up/down for pt due to increased fear in dynamic standing.  Pt incontinent of bladder prior to toileting, therapist assisting with hygiene and then donning clean in continence brief.  Pt complete stand pivot transfer mod assist sit > stand from [facility] due to fatigue and transferred back to w/c min assist.  Pt agreeable to staying up in w/c for awhile before returning to bed.  Therefore left upright in w/c with seatbelt alarm on and all needs in reach, husband present.  Therapy Documentation Precautions:  Precautions Precautions: Fall Restrictions Weight Bearing Restrictions: Yes LLE Weight Bearing: Weight bearing as tolerated Pain:  Pt with c/o pain in LLE with mobility, not rated.   Therapy/Group: Individual Therapy  Simonne Come 07/27/2020, 3:37 PM

## 2020-07-27 NOTE — Progress Notes (Signed)
Physical Therapy Session Note  Patient Details  Name: Stacy Valencia MRN: [id number] Date of Birth: 03-31-1933  Today's Date: 07/27/2020 PT Individual Time: 0805-0900 PT Individual Time Calculation (min): 55 min   Short Term Goals: Week 1:  PT Short Term Goal 1 (Week 1): pt will perform bed mobility with mod assist PT Short Term Goal 2 (Week 1): Pt will ambulate 88ft with mod assist and LRAD PT Short Term Goal 3 (Week 1): Pt will propell WC x 132ft with supervision assist PT Short Term Goal 4 (Week 1): pt will transfer to [facility] with mod assist  Skilled Therapeutic Interventions/Progress Updates:   Pt received supine in bed stating she is doing "fine" with slight teary eyes. Pt reports she was "in a lot of pain until 3AM and didn't get much sleep" and says that therapy "really, really wore me out yesterday." Pt states "I would rather have 5 babies in 1 day than deal with this pain" pt describes the pain as "like a tooth ache." RN notified of increased pain. Supine>sitting R EOB, HOB maximally elevated and using bedrail, with mod assist for L LE management and trunk upright via HHA - requires significantly increased time, slow movements, and cuing for breathing strategies for pain management during this task as pt vocalizes/moans out in pain. Sitting EOB threaded pants over B LEs max assist. Sit>stand elevated EOB>RW with min assist for lifting/balance, cuing for safe UE placement on AD as pt tries to pull herself up using RW - standing with min assist using RW pulled pants up over hips total assist. L stand pivot to w/c using RW with min assist for balance - cuing for sequencing of AD and LE stepping - significant increase in pain with this task requiring increased time and continued cuing for breathing.  Transported to/from gym in w/c for time management and energy conservation. Sit>stand w/c>RW with cuing again to push 1 hand on w/c armrest as opposed to pulling up on RW - min assist for lifting/balance.  Gait training ~15ft using RW with min assist and w/c follow for safety - pt with significantly slow movement and significantly decreased B LE step lengths and foot clearance with antalgic gait and decreased L LE weightbearing. Pt declines any further attempts to stand during session. Performed the following seated BUE strengthening and activity tolerance tasks while having pt scoot/lean forward in w/c and removing arm rests to promote increased trunk activation:  - beach ball toss using 3lb dowel rod x10 reps - bicep curls using 3lb weights x12reps each  - overhead arm elevation in scaption plane x10 reps each Cuing for proper form/technique. Transported back to room and left seated in w/c with needs in reach and seat belt alarm on.   Therapy Documentation Precautions:  Precautions Precautions: Fall Restrictions Weight Bearing Restrictions: Yes LLE Weight Bearing: Weight bearing as tolerated  Pain: Details above - pt with significant L hip pain - RN notified - provided seated rest breaks and repositioning for pain management.    Therapy/Group: Individual Therapy  Tawana Scale , PT, DPT, CSRS  07/27/2020, 7:52 AM

## 2020-07-27 NOTE — IPOC Note (Signed)
Overall Plan of Care [facility]) Patient Details Name: Stacy Valencia MRN: [id number] DOB: 06/30/1933  Admitting Diagnosis: Hip fracture [facility])  Hospital Problems: Principal Problem:   Hip fracture ([place]) Active Problems:   Femur fracture, left ([place])     Functional Problem List: Nursing Bladder, Bowel, Edema, Endurance, Pain, Perception, Safety, Skin Integrity  PT Balance, Behavior, Edema, Endurance, Pain, Safety, Skin Integrity  OT Balance, Endurance, Motor, Pain  SLP    TR         Basic ADL's: OT Grooming, Bathing, Dressing, Toileting     Advanced  ADL's: OT       Transfers: PT Bed Mobility, Bed to Chair, Car, Furniture, Floor, Other (comment)  OT Toilet, Tub/Shower     Locomotion: PT Ambulation, Emergency planning/management officer, Stairs     Additional Impairments: OT None  SLP        TR      Anticipated Outcomes Item Anticipated Outcome  Self Feeding    Swallowing      Basic self-care  Supervision/min A  Toileting  Min iA   Bathroom Transfers Supervision/min A  Bowel/Bladder  supervision-min assist  Transfers  supervision assist with LRAD  Locomotion  ambulatory at house hold distance with supervision assist  Communication     Cognition     Pain  <4 on a 0-10 pain scale  Safety/Judgment  supervision-min assist   Therapy Plan: PT Intensity: Minimum of 1-2 x/day ,45 to 90 minutes PT Frequency: 5 out of 7 days PT Duration Estimated Length of Stay: 17-21 days OT Intensity: Minimum of 1-2 x/day, 45 to 90 minutes OT Frequency: 5 out of 7 days OT Duration/Estimated Length of Stay: 17-21 days     Due to the current state of emergency, patients may not be receiving their 3-hours of Medicare-mandated therapy.   Team Interventions: Nursing Interventions Patient/Family Education, Bladder Management, Bowel Management, Disease Management/Prevention, Pain Management, Medication Management, Skin Care/Wound Management, Discharge Planning, Psychosocial Support  PT  interventions Ambulation/gait training, Community reintegration, DME/adaptive equipment instruction, Neuromuscular re-education, Psychosocial support, Stair training, UE/LE Strength taining/ROM, Wheelchair propulsion/positioning, UE/LE Coordination activities, Therapeutic Activities, Skin care/wound management, Pain management, Functional electrical stimulation, Discharge planning, Balance/vestibular training, Cognitive remediation/compensation, Disease management/prevention, Functional mobility training, Patient/family education, Splinting/orthotics, Therapeutic Exercise, Visual/perceptual remediation/compensation  OT Interventions Training and development officer, Community reintegration, Discharge planning, Disease mangement/prevention, Engineer, drilling, Functional mobility training, Pain management, Patient/family education, Self Care/advanced ADL retraining, Therapeutic Activities, Therapeutic Exercise, UE/LE Strength taining/ROM, Wheelchair propulsion/positioning  SLP Interventions    TR Interventions    SW/CM Interventions Discharge Planning, Disease Management/Prevention, Psychosocial Support, Patient/Family Education   Barriers to Discharge MD  Medical stability  Nursing Inaccessible home environment, Decreased caregiver support, Home environment access/layout, Incontinence, Wound Care, Lack of/limited family support, Weight bearing restrictions, Nutrition means    PT Inaccessible home environment, Home environment access/layout, Weight, Medication compliance, Behavior    OT      SLP      SW Home environment access/layout 3 step entry to home right rail in garage and front 3 steps without rails; willing to install ramp if necessary   Team Discharge Planning: Destination: PT-Home ,OT- Home , SLP-  Projected Follow-up: PT-Home health PT, OT-  Home health OT, SLP-  Projected Equipment Needs: PT-To be determined, Rolling walker with 5" wheels, Wheelchair (measurements),  Wheelchair cushion (measurements), OT- To be determined, SLP-  Equipment Details: PT- , OT-  Patient/family involved in discharge planning: PT- Patient, Family member/caregiver,  OT-Patient, SLP-   MD ELOS: 15-19 days MinA  Medical Rehab Prognosis:  Excellent Assessment: Stacy Valencia is an 86 year old woman who is admitted to CIR with deficits with mobility, transfers, self-care secondary to left hip fracture status post IM nail. Her pain is well controlled with Hydrocodone. She has constipation currently being treated with colace and senna. May use enema if needed. Labs are being monitored weekly given AKI and ABLA. Incision is being monitored daily. Vitals are being monitored three times per day given tachycardia. Supplemental oxygen is being weaned as tolerated.    See Team Conference Notes for weekly updates to the plan of care

## 2020-07-27 NOTE — Progress Notes (Signed)
[place] PHYSICAL MEDICINE & REHABILITATION PROGRESS NOTE   Subjective/Complaints: Complains of left lateral thigh pain today in location of IT band, attributes this to swelling. Has not been icing. Norco helps a lot. No BM charted. Does not want enema. Does not feel uncomfortable   Objective:   No results found. Recent Labs    07/25/20 0259 07/25/20 0259 07/25/20 1014 07/26/20 0553  WBC 11.0*  --   --  8.5  HGB 7.8*   < > 8.2* 7.4*  HCT 24.3*   < > 25.6* 23.0*  PLT 133*  --   --  184   < > = values in this interval not displayed.   Recent Labs    07/25/20 0259 07/26/20 0553  NA 133* 133*  K 4.4 4.1  CL 101 101  CO2 26 26  GLUCOSE 96 115*  BUN 26* 23  CREATININE 1.30* 1.01*  CALCIUM 8.2* 8.0*    Intake/Output Summary (Last 24 hours) at 07/27/2020 1601 Last data filed at 07/27/2020 1500 Gross per 24 hour  Intake 530 ml  Output 600 ml  Net -70 ml     Physical Exam: Vital Signs Blood pressure (!) 122/57, pulse 68, temperature 98 F (36.7 C), resp. rate 18, height 5\' 6"  (1.676 m), weight 97.3 kg, SpO2 93 %. Marland Kitchenzts  General: Alert and oriented x 3, No apparent distress HEENT: Head is normocephalic, atraumatic, PERRLA, EOMI, sclera anicteric, oral mucosa pink and moist, dentition intact, ext ear canals clear,  Neck: Supple without JVD or lymphadenopathy Heart: Reg rate and rhythm. No murmurs rubs or gallops Chest: CTA bilaterally without wheezes, rales, or rhonchi; [place] in place Abdomen: Soft, non-tender, distended, bowel sounds positive. Extremities: No clubbing, cyanosis, or edema. Pulses are 2+ Skin: Clean and intact without signs of breakdown Neuro: Pt is cognitively appropriate with normal insight, memory, and awareness. Bilateral upper extremities, right lower extremity: 5/5 proximal distal Left lower extremity: Hip flexion 2/5 (pain inhibition), ankle dorsiflexion 4+/5. Left hip with edema and tenderness  Psych: Pt's affect is appropriate. Pt is  cooperative  Assessment/Plan: 1. Functional deficits secondary to left hip fracture which require 3+ hours per day of interdisciplinary therapy in a comprehensive inpatient rehab setting.  Physiatrist is providing close team supervision and 24 hour management of active medical problems listed below.  Physiatrist and rehab team continue to assess barriers to discharge/monitor patient progress toward functional and medical goals  Care Tool:  Bathing    Body parts bathed by patient: Right arm, Left arm, Chest, Abdomen, Front perineal area, Right upper leg, Left upper leg, Face   Body parts bathed by helper: Left lower leg, Right lower leg, Buttocks     Bathing assist Assist Level: Maximal Assistance - Patient 24 - 49%     Upper Body Dressing/Undressing Upper body dressing   What is the patient wearing?: Pull over shirt    Upper body assist Assist Level: Contact Guard/Touching assist    Lower Body Dressing/Undressing Lower body dressing      What is the patient wearing?: Pants     Lower body assist Assist for lower body dressing: Total Assistance - Patient < 25%     Toileting Toileting    Toileting assist Assist for toileting: Total Assistance - Patient < 25%     Transfers Chair/bed transfer  Transfers assist     Chair/bed transfer assist level: Minimal Assistance - Patient > 75% Chair/bed transfer assistive device: Programmer, multimedia   Ambulation assist  Assist level: 2 helpers (min A and +2 w/c follow) Assistive device: Walker-rolling Max distance: 64ft   Walk 10 feet activity   Assist  Walk 10 feet activity did not occur: Safety/medical concerns        Walk 50 feet activity   Assist Walk 50 feet with 2 turns activity did not occur: Safety/medical concerns         Walk 150 feet activity   Assist Walk 150 feet activity did not occur: Safety/medical concerns         Walk 10 feet on uneven surface   activity   Assist Walk 10 feet on uneven surfaces activity did not occur: Safety/medical concerns         Wheelchair     Assist Will patient use wheelchair at discharge?: Yes (Per PT long-term goals) Type of Wheelchair: Manual    Wheelchair assist level: Supervision/Verbal cueing Max wheelchair distance: 50    Wheelchair 50 feet with 2 turns activity    Assist        Assist Level: Supervision/Verbal cueing   Wheelchair 150 feet activity     Assist      Assist Level: Moderate Assistance - Patient 50 - 74%   Blood pressure (!) 122/57, pulse 68, temperature 98 F (36.7 C), resp. rate 18, height 5\' 6"  (1.676 m), weight 97.3 kg, SpO2 93 %.  Medical Problem List and Plan: 1.  Deficits with mobility, transfers, self-care secondary to left hip fracture status post IM nail.             -patient may not shower             -ELOS/Goals: 15-19 days/min a             Continue CIR 2. H/o PE/ Antithrombotics: -DVT/anticoagulation:  Pharmaceutical: Xarelto             -antiplatelet therapy: N/A 3. Pain Management: Hydrocodone as needed--monitor for signs of sedation or confusion             Monitor with increased exertion 4. Mood: LCSW to follow for evaluation and support             -antipsychotic agents: N/A 5. Neuropsych: This patient is capable of making decisions on her own behalf. 6. Skin/Wound Care: Monitor wound for healing.  Added protein supplements to promote healing 7. Fluids/Electrolytes/Nutrition: Monitor I's/O.  Encourage fluid intake.  CMP ordered for tomorrow a.m. 8. GERD: Continue Protonix with Carafate as needed indigestion 9. ABLA: Added iron supplement.  Hgb 7.4 on 8/5, repeat Monday.  10.  Tachycardia: Continue to monitor heart rate/BP 3 times daily.  On Lopressor 12.5 mg twice daily. Well controlled             Monitor with increased activity. 11.  Acute renal injury: Encourage fluid intake.               Creatinine much improved to 1.01.   12.  Supplemental oxygen dependent             Wean as tolerated   LOS: 2 days A FACE TO FACE EVALUATION WAS PERFORMED  Aldine Chakraborty P Hillery Zachman 07/27/2020, 4:01 PM

## 2020-07-28 ENCOUNTER — Inpatient Hospital Stay (HOSPITAL_COMMUNITY): Payer: Medicare Other

## 2020-07-28 ENCOUNTER — Inpatient Hospital Stay (HOSPITAL_COMMUNITY): Payer: Medicare Other | Admitting: Physical Therapy

## 2020-07-28 ENCOUNTER — Inpatient Hospital Stay (HOSPITAL_COMMUNITY): Payer: Medicare Other | Admitting: Occupational Therapy

## 2020-07-28 DIAGNOSIS — S72009S Fracture of unspecified part of neck of unspecified femur, sequela: Secondary | ICD-10-CM

## 2020-07-28 MED ORDER — HYDROCODONE-ACETAMINOPHEN 7.5-325 MG PO TABS
1.0000 | ORAL_TABLET | Freq: Four times a day (QID) | ORAL | Status: DC | PRN
  Administered 2020-07-29 – 2020-08-04 (×14): 2 via ORAL
  Administered 2020-08-04: 1 via ORAL
  Administered 2020-08-05: 2 via ORAL
  Filled 2020-07-28 (×18): qty 2

## 2020-07-28 NOTE — Progress Notes (Signed)
Occupational Therapy Session Note  Patient Details  Name: Stacy Valencia MRN: [id number] Date of Birth: 07/18/1933  Today's Date: 07/28/2020 OT Individual Time: 1105-1200 OT Individual Time Calculation (min): 55 min    Short Term Goals: Week 1:  OT Short Term Goal 1 (Week 1): Pt will complete sit<>stand with max A of 1 OT Short Term Goal 2 (Week 1): Pt will tolerate sitting EOB for 5 mins with no more than min A OT Short Term Goal 3 (Week 1): Pt will complete toilet transfer with max A of 1  Skilled Therapeutic Interventions/Progress Updates:    1:1. Pt received in w/c agreeable to OT with pain in L femur unrated. Rest and repositioning provided PRN as meds not available at this time. OT edu re dressing stick, sock aide and LE lifter. Pt uses dressing stick and sock aide to practice threading LEs (did it backwards on first attempt) and doff/don socks. Pt requries significantly increased time to rest in between steps d/t pain but husband encouraged pt able to do it herself. Pt requires VC for AE technique, but no physical A to complete steps. Exited session wih tpt seated in bed, exi talarm on and call light in reach  Therapy Documentation Precautions:  Precautions Precautions: Fall Restrictions Weight Bearing Restrictions: Yes LLE Weight Bearing: Weight bearing as tolerated General:   Vital Signs:  Pain: Pain Assessment Pain Scale: 0-10 Pain Score: 7  Pain Type: Acute pain;Surgical pain Pain Location: Leg Pain Orientation: Left Pain Descriptors / Indicators: Aching;Cramping Pain Frequency: Rarely Pain Onset: On-going Patients Stated Pain Goal: 3 Pain Intervention(s): Medication (See eMAR) Multiple Pain Sites: No ADL: ADL Eating: Set up Grooming: Setup Upper Body Bathing: Minimal assistance Lower Body Bathing: Maximal assistance Upper Body Dressing: Moderate assistance Lower Body Dressing: Dependent Toileting: Maximal assistance Vision   Perception    Praxis    Exercises:   Other Treatments:     Therapy/Group: Individual Therapy  Tonny Branch 07/28/2020, 12:08 PM

## 2020-07-28 NOTE — Plan of Care (Signed)
°  Problem: RH BOWEL ELIMINATION Goal: RH STG MANAGE BOWEL WITH ASSISTANCE Description: STG Manage Bowel with Assistance. Outcome: Progressing Goal: RH STG MANAGE BOWEL W/MEDICATION W/ASSISTANCE Description: STG Manage Bowel with Medication with Assistance. Outcome: Progressing   Problem: RH BLADDER ELIMINATION Goal: RH STG MANAGE BLADDER WITH ASSISTANCE Description: STG Manage Bladder With Assistance Outcome: Progressing

## 2020-07-28 NOTE — Progress Notes (Signed)
Physical Therapy Session Note  Patient Details  Name: Stacy Valencia MRN: [id number] Date of Birth: 1933/04/17  Today's Date: 07/28/2020 PT Individual Time: 5521-7471 PT Individual Time Calculation (min): 30 min   Short Term Goals: Week 1:  PT Short Term Goal 1 (Week 1): pt will perform bed mobility with mod assist PT Short Term Goal 2 (Week 1): Pt will ambulate 87ft with mod assist and LRAD PT Short Term Goal 3 (Week 1): Pt will propell WC x 152ft with supervision assist PT Short Term Goal 4 (Week 1): pt will transfer to [facility] with mod assist  Skilled Therapeutic Interventions/Progress Updates:    Pt received sitting in w/c with her husband, Mikki Santee, exiting upon therapist arrival. Pt agreeable to therapy session reporting urge to urinate. Sit<>stands using RW with min assist for lifting/balance - pt prefers to push up from armrests with both hands then transition to RW, which is safe but causes delay in her brining trunk upright. R stand pivot w/c>BSC using RW with CGA/min assist for steadying/balance. Standing with CGA for steadying performed total assist brief management and peri-care. Pt continent of bladder. R stand pivot BSC>EOB using RW with CGA/min assist for steadying/balance. Sit>supine with max assist for B LE management into bed. Supine scoot towards HOB using bed features. Pt left supine in bed with needs in reach and bed alarm on. RN and NT notified of pt voiding bladder and pt requesting medication and ice.  Therapy Documentation Precautions:  Precautions Precautions: Fall Restrictions Weight Bearing Restrictions: Yes LLE Weight Bearing: Weight bearing as tolerated  Pain:   Pt moans with movement due to pain in L LE and continues to report she has "pain, pain, pain" but does state medication administered and appears in less pain compared to last time this therapist worked with pt.   Therapy/Group: Individual Therapy  Tawana Scale , PT, DPT, CSRS  07/28/2020, 4:01 PM

## 2020-07-28 NOTE — Progress Notes (Signed)
Occupational Therapy Session Note  Patient Details  Name: Stacy Valencia MRN: [id number] Date of Birth: 06/08/1933  Today's Date: 07/28/2020 OT Individual Time: 1300-1355 OT Individual Time Calculation (min): 55 min   Short Term Goals: Week 1:  OT Short Term Goal 1 (Week 1): Pt will complete sit<>stand with max A of 1 OT Short Term Goal 2 (Week 1): Pt will tolerate sitting EOB for 5 mins with no more than min A OT Short Term Goal 3 (Week 1): Pt will complete toilet transfer with max A of 1  Skilled Therapeutic Interventions/Progress Updates:    Pt greeted in the w/c and premedicated for pain. She reported needing to void bladder so OT placed the [facility] over toilet in prep for toileting. Pt very adamant that she would not attempt a BSC or toilet transfer. Insistent on using the female urinal. OT educated pt on her therapy goals, strongly recommending to transfer to toilet however pt continued to refuse. After OT set her up with the female urinal, pt was unable to void. She refused all therapy interventions out of the room, self care participation, or standing activity. Agreeable to UB exercises with husband present to provide encouragement. Pt used the 2# dowel for B UEs, 3# handweight for the Lt hand, and also an unweighted ball bilaterally. Pt guided through 10 reps, 1-2 sets each exercise. Exercises modified on the Rt side due to hx shoulder injury and chronic pain. Also instructed pt in gentle UB stretches prior to exercise circuit. At end of session pt remained sitting in her w/c with all needs within reach and safety belt fastened.   Therapy Documentation Precautions:  Precautions Precautions: Fall Restrictions Weight Bearing Restrictions: Yes LLE Weight Bearing: Weight bearing as tolerated ADL: ADL Eating: Set up Grooming: Setup Upper Body Bathing: Minimal assistance Lower Body Bathing: Maximal assistance Upper Body Dressing: Moderate assistance Lower Body Dressing:  Dependent Toileting: Maximal assistance      Therapy/Group: Individual Therapy  Jaylynn Siefert A Tekeisha Hakim 07/28/2020, 4:12 PM

## 2020-07-28 NOTE — Progress Notes (Signed)
Physical Therapy Session Note  Patient Details  Name: Stacy Valencia MRN: [id number] Date of Birth: 27-Apr-1933  Today's Date: 07/28/2020 PT Individual Time: 0905-0959 PT Individual Time Calculation (min): 54 min   Short Term Goals: Week 1:  PT Short Term Goal 1 (Week 1): pt will perform bed mobility with mod assist PT Short Term Goal 2 (Week 1): Pt will ambulate 76ft with mod assist and LRAD PT Short Term Goal 3 (Week 1): Pt will propell WC x 159ft with supervision assist PT Short Term Goal 4 (Week 1): pt will transfer to [facility] with mod assist  Skilled Therapeutic Interventions/Progress Updates: Pt presents supine in bed, moaning in pain.  Nursing notified and is bringing pain meds.  Pt agreeable to participate in therapy.  Pt performed supine AP, QS, GS 2-3 x 15 w/ improvement in pain per pt.  Pt performed sup to sit transfers using side rails and elevated HOB w/ mod A and sat EOB x 4'.  Pt transferred sit to stand w/ mod A and verbal cues for initiation.  Pt able to step to w/c w/ RW and min A, x 5 steps.  Pt performed LAQ, isometric add and calf raises 2 x 15.  Pt amb w/ RW x 5' w/ min A and encouragement, verbal cues for foot clearance.  Pt remained sitting in w/c w/ chair alarm on and all needs in reach.     Therapy Documentation Precautions:  Precautions Precautions: Fall Restrictions Weight Bearing Restrictions: Yes LLE Weight Bearing: Weight bearing as tolerated General:   Vital Signs:  Pain:9/10 left hip, meds given during rx.   Other Treatments:      Therapy/Group: Individual Therapy  [name] 07/28/2020, 10:01 AM

## 2020-07-28 NOTE — Progress Notes (Signed)
PHYSICAL MEDICINE & REHABILITATION PROGRESS NOTE   Subjective/Complaints:  Pt does not feel pain meds are helping  ROS- neg CP, SOB, N/V/D, + constipation    Objective:   No results found. Recent Labs    07/25/20 1014 07/26/20 0553  WBC  --  8.5  HGB 8.2* 7.4*  HCT 25.6* 23.0*  PLT  --  184   Recent Labs    07/26/20 0553  NA 133*  K 4.1  CL 101  CO2 26  GLUCOSE 115*  BUN 23  CREATININE 1.01*  CALCIUM 8.0*    Intake/Output Summary (Last 24 hours) at 07/28/2020 0165 Last data filed at 07/28/2020 0326 Gross per 24 hour  Intake 660 ml  Output 400 ml  Net 260 ml     Physical Exam: Vital Signs Blood pressure (!) 104/45, pulse 92, temperature 98.1 F (36.7 C), temperature source Oral, resp. rate 20, height 5\' 6"  (1.676 m), weight 97.3 kg, SpO2 94 %. Marland Kitchenzts  General: No acute distress Mood and affect are appropriate Heart: Regular rate and rhythm no rubs murmurs or extra sounds Lungs: Clear to auscultation, breathing unlabored, no rales or wheezes Abdomen: Positive bowel sounds, soft nontender to palpation, nondistended Extremities: No clubbing, cyanosis, or edema Skin: erythema around the staples but no drainage or tenderness Neurologic: Cranial nerves II through XII intact, motor strength is 5/5 in bilateral deltoid, bicep, tricep, grip, Right hip flexor, knee extensors, ankle dorsiflexor and plantar flexor  Left lower extremity: Hip flexion 2/5 (pain inhibition), ankle dorsiflexion 4+/5. Left hip with edema and tenderness  Psych: Pt's affect is appropriate. Pt is cooperative  Assessment/Plan: 1. Functional deficits secondary to left hip fracture which require 3+ hours per day of interdisciplinary therapy in a comprehensive inpatient rehab setting.  Physiatrist is providing close team supervision and 24 hour management of active medical problems listed below.  Physiatrist and rehab team continue to assess barriers to discharge/monitor patient progress  toward functional and medical goals  Care Tool:  Bathing    Body parts bathed by patient: Right arm, Left arm, Chest, Abdomen, Front perineal area, Right upper leg, Left upper leg, Face   Body parts bathed by helper: Left lower leg, Right lower leg, Buttocks     Bathing assist Assist Level: Maximal Assistance - Patient 24 - 49%     Upper Body Dressing/Undressing Upper body dressing   What is the patient wearing?: Pull over shirt    Upper body assist Assist Level: Moderate Assistance - Patient 50 - 74%    Lower Body Dressing/Undressing Lower body dressing      What is the patient wearing?: Incontinence brief, Pants     Lower body assist Assist for lower body dressing: Total Assistance - Patient < 25%     Toileting Toileting    Toileting assist Assist for toileting: Total Assistance - Patient < 25%     Transfers Chair/bed transfer  Transfers assist     Chair/bed transfer assist level: Minimal Assistance - Patient > 75% Chair/bed transfer assistive device: Programmer, multimedia   Ambulation assist      Assist level: 2 helpers (min A and +2 w/c follow) Assistive device: Walker-rolling Max distance: 28ft   Walk 10 feet activity   Assist  Walk 10 feet activity did not occur: Safety/medical concerns        Walk 50 feet activity   Assist Walk 50 feet with 2 turns activity did not occur: Safety/medical concerns  Walk 150 feet activity   Assist Walk 150 feet activity did not occur: Safety/medical concerns         Walk 10 feet on uneven surface  activity   Assist Walk 10 feet on uneven surfaces activity did not occur: Safety/medical concerns         Wheelchair     Assist Will patient use wheelchair at discharge?: Yes (Per PT long-term goals) Type of Wheelchair: Manual    Wheelchair assist level: Supervision/Verbal cueing Max wheelchair distance: 50    Wheelchair 50 feet with 2 turns  activity    Assist        Assist Level: Supervision/Verbal cueing   Wheelchair 150 feet activity     Assist      Assist Level: Moderate Assistance - Patient 50 - 74%   Blood pressure (!) 104/45, pulse 92, temperature 98.1 F (36.7 C), temperature source Oral, resp. rate 20, height 5\' 6"  (1.676 m), weight 97.3 kg, SpO2 94 %.  Medical Problem List and Plan: 1.  Deficits with mobility, transfers, self-care secondary to left hip fracture status post IM nail.             -patient may not shower             -ELOS/Goals: 15-19 days/min a             Continue CIR 2. H/o PE/ Antithrombotics: -DVT/anticoagulation:  Pharmaceutical: Xarelto             -antiplatelet therapy: N/A 3. Pain Management: Hydrocodone as needed--uncontrolled increase norco to 7.5mg    q 6h 4. Mood: LCSW to follow for evaluation and support             -antipsychotic agents: N/A 5. Neuropsych: This patient is capable of making decisions on her own behalf. 6. Skin/Wound Care: Monitor wound for healing.  Added protein supplements to promote healing 7. Fluids/Electrolytes/Nutrition: Monitor I's/O.  Encourage fluid intake.  CMP ordered for tomorrow a.m. 8. GERD: Continue Protonix with Carafate as needed indigestion 9. ABLA: Added iron supplement.  Hgb 7.4 on 8/5, repeat Monday. Check stool OB 10.  Tachycardia: Continue to monitor heart rate/BP 3 times daily.  On Lopressor 12.5 mg twice daily. Well controlled             Monitor with increased activity. 11.  Acute renal injury: Encourage fluid intake.               Creatinine much improved to 1.01.  12.  Supplemental oxygen dependent             Wean as tolerated   LOS: 3 days A FACE TO FACE EVALUATION WAS PERFORMED  Charlett Blake 07/28/2020, 6:37 AM

## 2020-07-29 MED ORDER — FERROUS SULFATE 325 (65 FE) MG PO TABS
325.0000 mg | ORAL_TABLET | Freq: Every day | ORAL | Status: DC
  Administered 2020-07-30 – 2020-08-05 (×7): 325 mg via ORAL
  Filled 2020-07-29 (×7): qty 1

## 2020-07-29 NOTE — Progress Notes (Signed)
[place] PHYSICAL MEDICINE & REHABILITATION PROGRESS NOTE   Subjective/Complaints:  Felt hydrocodone made her BP go too low, does ok with tramadol and tylenol   ROS- neg CP, SOB, N/V/D, + constipation    Objective:   No results found. No results for input(s): WBC, HGB, HCT, PLT in the last 72 hours. No results for input(s): NA, K, CL, CO2, GLUCOSE, BUN, CREATININE, CALCIUM in the last 72 hours.  Intake/Output Summary (Last 24 hours) at 07/29/2020 0603 Last data filed at 07/29/2020 0130 Gross per 24 hour  Intake 920 ml  Output 975 ml  Net -55 ml     Physical Exam: Vital Signs Blood pressure 111/83, pulse 82, temperature 97.9 F (36.6 C), resp. rate 18, height 5\' 6"  (1.676 m), weight 97.3 kg, SpO2 92 %. Marland Kitchenzts  General: No acute distress Mood and affect are appropriate Heart: Regular rate and rhythm no rubs murmurs or extra sounds Lungs: Clear to auscultation, breathing unlabored, no rales or wheezes Abdomen: Positive bowel sounds, soft nontender to palpation, nondistended Extremities: No clubbing, cyanosis, or edema Skin: erythema around the staples but no drainage or tenderness Neurologic: Cranial nerves II through XII intact, motor strength is 5/5 in bilateral deltoid, bicep, tricep, grip, Right hip flexor, knee extensors, ankle dorsiflexor and plantar flexor  Left lower extremity: Hip flexion 2/5 (pain inhibition), ankle dorsiflexion 4+/5. Left hip with edema and tenderness  Psych: Pt's affect is appropriate. Pt is cooperative  Assessment/Plan: 1. Functional deficits secondary to left hip fracture which require 3+ hours per day of interdisciplinary therapy in a comprehensive inpatient rehab setting.  Physiatrist is providing close team supervision and 24 hour management of active medical problems listed below.  Physiatrist and rehab team continue to assess barriers to discharge/monitor patient progress toward functional and medical goals  Care Tool:  Bathing     Body parts bathed by patient: Right arm, Left arm, Chest, Abdomen, Front perineal area, Right upper leg, Left upper leg, Face   Body parts bathed by helper: Left lower leg, Right lower leg, Buttocks     Bathing assist Assist Level: Maximal Assistance - Patient 24 - 49%     Upper Body Dressing/Undressing Upper body dressing   What is the patient wearing?: Pull over shirt    Upper body assist Assist Level: Moderate Assistance - Patient 50 - 74%    Lower Body Dressing/Undressing Lower body dressing      What is the patient wearing?: Incontinence brief, Pants     Lower body assist Assist for lower body dressing: Maximal Assistance - Patient 25 - 49%     Toileting Toileting    Toileting assist Assist for toileting: Total Assistance - Patient < 25%     Transfers Chair/bed transfer  Transfers assist     Chair/bed transfer assist level: Minimal Assistance - Patient > 75% Chair/bed transfer assistive device: Programmer, multimedia   Ambulation assist      Assist level: Minimal Assistance - Patient > 75% Assistive device: Walker-rolling Max distance: 5   Walk 10 feet activity   Assist  Walk 10 feet activity did not occur: Safety/medical concerns        Walk 50 feet activity   Assist Walk 50 feet with 2 turns activity did not occur: Safety/medical concerns         Walk 150 feet activity   Assist Walk 150 feet activity did not occur: Safety/medical concerns         Walk 10 feet on  uneven surface  activity   Assist Walk 10 feet on uneven surfaces activity did not occur: Safety/medical concerns         Wheelchair     Assist Will patient use wheelchair at discharge?: Yes (Per PT long-term goals) Type of Wheelchair: Manual    Wheelchair assist level: Supervision/Verbal cueing Max wheelchair distance: 50    Wheelchair 50 feet with 2 turns activity    Assist        Assist Level: Supervision/Verbal cueing    Wheelchair 150 feet activity     Assist      Assist Level: Moderate Assistance - Patient 50 - 74%   Blood pressure 111/83, pulse 82, temperature 97.9 F (36.6 C), resp. rate 18, height 5\' 6"  (1.676 m), weight 97.3 kg, SpO2 92 %.  Medical Problem List and Plan: 1.  Deficits with mobility, transfers, self-care secondary to left hip fracture status post IM nail.             -patient may not shower             -ELOS/Goals: 15-19 days/min a             Continue CIR 2. H/o PE/ Antithrombotics: -DVT/anticoagulation:  Pharmaceutical: Xarelto             -antiplatelet therapy: N/A 3. Pain Management: Hydrocodone as needed--D/C norco to 7.5mg  , cont alternating tramadol and tylenol   q 6h 4. Mood: LCSW to follow for evaluation and support             -antipsychotic agents: N/A 5. Neuropsych: This patient is capable of making decisions on her own behalf. 6. Skin/Wound Care: Monitor wound for healing.  Added protein supplements to promote healing 7. Fluids/Electrolytes/Nutrition: Monitor I's/O.  Encourage fluid intake.  CMP ordered for tomorrow a.m. 8. GERD: Continue Protonix with Carafate as needed indigestion 9. ABLA: Added iron supplement.  Hgb 7.4 on 8/5, repeat Monday. Check stool OB 10.  Tachycardia: Continue to monitor heart rate/BP 3 times daily.  On Lopressor 12.5 mg twice daily. Well controlled             Monitor with increased activity. 11.  Acute renal injury: Encourage fluid intake.               Creatinine much improved to 1.01.  12.  Supplemental oxygen dependent             Wean as tolerated   LOS: 4 days A FACE TO FACE EVALUATION WAS PERFORMED  Stacy Valencia 07/29/2020, 6:03 AM

## 2020-07-29 NOTE — Plan of Care (Signed)
  Problem: Consults Goal: RH GENERAL PATIENT EDUCATION Description: See Patient Education module for education specifics. Outcome: Progressing Goal: Skin Care Protocol Initiated - if Braden Score 18 or less Description: If consults are not indicated, leave blank or document N/A Outcome: Progressing Goal: Nutrition Consult-if indicated Outcome: Progressing   Problem: RH BOWEL ELIMINATION Goal: RH STG MANAGE BOWEL WITH ASSISTANCE Description: STG Manage Bowel with Assistance. Outcome: Progressing Goal: RH STG MANAGE BOWEL W/MEDICATION W/ASSISTANCE Description: STG Manage Bowel with Medication with Assistance. Outcome: Progressing   Problem: RH BLADDER ELIMINATION Goal: RH STG MANAGE BLADDER WITH ASSISTANCE Description: STG Manage Bladder With Assistance Outcome: Progressing Goal: RH STG MANAGE BLADDER WITH MEDICATION WITH ASSISTANCE Description: STG Manage Bladder With Medication With Assistance. Outcome: Progressing Goal: RH STG MANAGE BLADDER WITH EQUIPMENT WITH ASSISTANCE Description: STG Manage Bladder With Equipment With Assistance Outcome: Progressing   Problem: RH SKIN INTEGRITY Goal: RH STG SKIN FREE OF INFECTION/BREAKDOWN Outcome: Progressing Goal: RH STG MAINTAIN SKIN INTEGRITY WITH ASSISTANCE Description: STG Maintain Skin Integrity With Assistance. Outcome: Progressing Goal: RH STG ABLE TO PERFORM INCISION/WOUND CARE W/ASSISTANCE Description: STG Able To Perform Incision/Wound Care With Assistance. Outcome: Progressing   Problem: RH SAFETY Goal: RH STG ADHERE TO SAFETY PRECAUTIONS W/ASSISTANCE/DEVICE Description: STG Adhere to Safety Precautions With Assistance/Device. Outcome: Progressing Goal: RH STG DECREASED RISK OF FALL WITH ASSISTANCE Description: STG Decreased Risk of Fall With Assistance. Outcome: Progressing   Problem: RH PAIN MANAGEMENT Goal: RH STG PAIN MANAGED AT OR BELOW PT'S PAIN GOAL Outcome: Progressing   Problem: RH KNOWLEDGE DEFICIT  GENERAL Goal: RH STG INCREASE KNOWLEDGE OF SELF CARE AFTER HOSPITALIZATION Outcome: Progressing

## 2020-07-30 ENCOUNTER — Inpatient Hospital Stay (HOSPITAL_COMMUNITY): Payer: Medicare Other | Admitting: Occupational Therapy

## 2020-07-30 ENCOUNTER — Inpatient Hospital Stay (HOSPITAL_COMMUNITY): Payer: Medicare Other

## 2020-07-30 LAB — BASIC METABOLIC PANEL
Anion gap: 6 (ref 5–15)
BUN: 14 mg/dL (ref 8–23)
CO2: 27 mmol/L (ref 22–32)
Calcium: 8.3 mg/dL — ABNORMAL LOW (ref 8.9–10.3)
Chloride: 102 mmol/L (ref 98–111)
Creatinine, Ser: 0.94 mg/dL (ref 0.44–1.00)
GFR calc Af Amer: >60 mL/min (ref >60)
GFR calc non Af Amer: 55 mL/min — ABNORMAL LOW (ref >60)
Glucose, Bld: 104 mg/dL — ABNORMAL HIGH (ref 70–99)
Potassium: 3.7 mmol/L (ref 3.5–5.1)
Sodium: 135 mmol/L (ref 135–145)

## 2020-07-30 LAB — CBC WITH DIFFERENTIAL/PLATELET
Abs Immature Granulocytes: 0.4 10*3/uL — ABNORMAL HIGH (ref 0.00–0.07)
Basophils Absolute: 0 10*3/uL (ref 0.0–0.1)
Basophils Relative: 0 %
Eosinophils Absolute: 0.5 10*3/uL (ref 0.0–0.5)
Eosinophils Relative: 5 %
HCT: 27.5 % — ABNORMAL LOW (ref 36.0–46.0)
Hemoglobin: 8.7 g/dL — ABNORMAL LOW (ref 12.0–15.0)
Lymphocytes Relative: 16 %
Lymphs Abs: 1.5 10*3/uL (ref 0.7–4.0)
MCH: 33.9 pg (ref 26.0–34.0)
MCHC: 31.6 g/dL (ref 30.0–36.0)
MCV: 107 fL — ABNORMAL HIGH (ref 80.0–100.0)
Metamyelocytes Relative: 2 %
Monocytes Absolute: 0.1 10*3/uL (ref 0.1–1.0)
Monocytes Relative: 1 %
Myelocytes: 2 %
Neutro Abs: 6.9 10*3/uL (ref 1.7–7.7)
Neutrophils Relative %: 74 %
Platelets: 301 10*3/uL (ref 150–400)
RBC: 2.57 MIL/uL — ABNORMAL LOW (ref 3.87–5.11)
RDW: 16.6 % — ABNORMAL HIGH (ref 11.5–15.5)
WBC: 9.3 10*3/uL (ref 4.0–10.5)
nRBC: 1 /100 WBC — ABNORMAL HIGH
nRBC: 1.8 % — ABNORMAL HIGH (ref 0.0–0.2)

## 2020-07-30 NOTE — Plan of Care (Signed)
  Problem: Consults Goal: RH GENERAL PATIENT EDUCATION Description: See Patient Education module for education specifics. Outcome: Progressing Goal: Skin Care Protocol Initiated - if Braden Score 18 or less Description: If consults are not indicated, leave blank or document N/A Outcome: Progressing Goal: Nutrition Consult-if indicated Outcome: Progressing   Problem: RH BOWEL ELIMINATION Goal: RH STG MANAGE BOWEL WITH ASSISTANCE Description: STG Manage Bowel with Assistance. Outcome: Progressing Goal: RH STG MANAGE BOWEL W/MEDICATION W/ASSISTANCE Description: STG Manage Bowel with Medication with Assistance. Outcome: Progressing   Problem: RH BLADDER ELIMINATION Goal: RH STG MANAGE BLADDER WITH ASSISTANCE Description: STG Manage Bladder With Moderate Assistance Outcome: Progressing Goal: RH STG MANAGE BLADDER WITH MEDICATION WITH ASSISTANCE Description: STG Manage Bladder With Medication With Assistance. Outcome: Progressing Goal: RH STG MANAGE BLADDER WITH EQUIPMENT WITH ASSISTANCE Description: STG Manage Bladder With Equipment With Assistance Outcome: Progressing   Problem: RH SKIN INTEGRITY Goal: RH STG SKIN FREE OF INFECTION/BREAKDOWN Outcome: Progressing Goal: RH STG MAINTAIN SKIN INTEGRITY WITH ASSISTANCE Description: STG Maintain Skin Integrity With Assistance. Outcome: Progressing Goal: RH STG ABLE TO PERFORM INCISION/WOUND CARE W/ASSISTANCE Description: STG Able To Perform Incision/Wound Care With Assistance. Outcome: Progressing   Problem: RH SAFETY Goal: RH STG ADHERE TO SAFETY PRECAUTIONS W/ASSISTANCE/DEVICE Description: STG Adhere to Safety Precautions With Assistance/Device. Outcome: Progressing Goal: RH STG DECREASED RISK OF FALL WITH ASSISTANCE Description: STG Decreased Risk of Fall With Assistance. Outcome: Progressing   Problem: RH PAIN MANAGEMENT Goal: RH STG PAIN MANAGED AT OR BELOW PT'S PAIN GOAL Outcome: Progressing   Problem: RH KNOWLEDGE  DEFICIT GENERAL Goal: RH STG INCREASE KNOWLEDGE OF SELF CARE AFTER HOSPITALIZATION Outcome: Progressing   

## 2020-07-30 NOTE — Progress Notes (Signed)
Physical Therapy Session Note  Patient Details  Name: Stacy Valencia MRN: [id number] Date of Birth: [birth year]  Today's Date: 07/30/2020 PT Individual Time: 1104-1204 PT Individual Time Calculation (min): 60 min   Short Term Goals: Week 1:  PT Short Term Goal 1 (Week 1): pt will perform bed mobility with mod assist PT Short Term Goal 2 (Week 1): Pt will ambulate 43ft with mod assist and LRAD PT Short Term Goal 3 (Week 1): Pt will propell WC x 141ft with supervision assist PT Short Term Goal 4 (Week 1): pt will transfer to [facility] with mod assist  Skilled Therapeutic Interventions/Progress Updates:   Pt received supine in bed and agreeable to therapy. Requires mod to max encouragement to participate secondary to pain and fatigue. MinA for supine to sit with PT managing LLE. Sit to stand and stand step transfer bed>WC with RW and minA. PT provides maxA for lower body dressing in WC. WC transport to dayroom for time management and energy conservation. Pt performs 15' ambulation with RW and minA. Extended seated rest break and pt ambulates 15' additional with max encouragement to complete.  Stand pivot transfer to Nustep with minA. Pt performs Nustep on workload of 1 for 10:00 with SPM > 30. Performed for strengthening and endurance training, as well as AAROM of LLE to help manage pain symptoms.  Stand pivot from Nustep>WC>bed with RW and minA. Sit to supine with modA managing BLEs. Pt left supine in bed with alarm intact and all needs within reach.  Therapy Documentation Precautions:  Precautions Precautions: Fall Restrictions Weight Bearing Restrictions: Yes LLE Weight Bearing: Weight bearing as tolerated   Pain: Pt complains of intense pain in LLE throughout session. Pain meds taken at 8am and PT session at 11am. PT provides redirection, repositioning, mobility, and rest breaks to manage pain symptoms.  Therapy/Group: Individual Therapy  Breck Coons, PT, DPT 07/30/2020, 11:47 AM

## 2020-07-30 NOTE — Progress Notes (Signed)
Occupational Therapy Session Note  Patient Details  Name: Stacy Valencia MRN: [id number] Date of Birth: 14-Dec-1933  Today's Date: 07/30/2020 OT Individual Time: 0800-0910 and 7035-0093 OT Individual Time Calculation (min): 70 min and 35 min Missed 40 mins of OT d/t refusal    Short Term Goals: Week 1:  OT Short Term Goal 1 (Week 1): Pt will complete sit<>stand with max A of 1 OT Short Term Goal 2 (Week 1): Pt will tolerate sitting EOB for 5 mins with no more than min A OT Short Term Goal 3 (Week 1): Pt will complete toilet transfer with max A of 1  Skilled Therapeutic Interventions/Progress Updates:    Session 1: Pt greeted at time of session supine in bed just finished toileting with nursing using female urinal, moaning and c/o pain in L hip. Number not given but later in session with sit to stand and bed mobility, pt reported 9/10 pain but had been premedicated prior to session and repositioned/rest breaks performed as needed. Explained role and purpose of OT and her time at CIR to maximize independence as the pt states she will be here for "a while." Supine to sit EOB Mod A with use of gait belt as assist to manage LLE, static and dynamic sitting at EOB for approx 30 mins with unilateral support on bed surface. Threaded feet through pants total A with instruction to dress LLE first, and once at calf level pt able to assist to bring up to thigh level. Needed to urinate at this time seated EOB and after retrieving BSC, sit to stand Mod A, unable to pivot on feet d/t fear of falling and pain, pt returned to sitting > supine with Max A to manage UB and BLEs. Pt had already urinated, performed dependent brief change with pt rolling L and R with Min A with use of bed rails. Scooting up in bed with Min A, positioned for comfort. Max encouragement required throughout session for participation. Alarm on, call bell in reach.   Session 2: Pt greeted at time of session supine in bed resting with husband  present. C/o pain throughout all movement but pt was premedicated, 7/10 pain. Educated pt and husband on role of OT to get the pt home and increase independence. Pt needed to urinate, strongly encouraged to use BSC or toilet, but insisted on using female urinal. Once placed under the pt, unable to void despite trying running water and decreased stimuli. Demonstrated TTB and shower seat options for the pt's husband as they have a large garden tub (no shower head) and a small walk in shower with no grab bars, will continue to problem solve. Upon return to the room, pt had urinated in brief, and encouraged to sit EOB for cleaning and donning new brief but adamantly refused to do so. Dependent brief change at bed level with pt rolling L and R with supervision, dependent for hygiene as well. Pt refused any further OT interventions stating "I just can't I have reached my limit. This pain is too exhausting." Pt left supine resting with alarm on, call bell in reach, husband present. Missed 40 mins OT.  Therapy Documentation Precautions:  Precautions Precautions: Fall Restrictions Weight Bearing Restrictions: Yes LLE Weight Bearing: Weight bearing as tolerated    Therapy/Group: Individual Therapy  Viona Gilmore 07/30/2020, 9:11 AM

## 2020-07-30 NOTE — Progress Notes (Signed)
[place] PHYSICAL MEDICINE & REHABILITATION PROGRESS NOTE   Subjective/Complaints:  LBM yesterday; L hip is painful, however hasn't received any pain meds yet this AM.   Needs to void- waiting for NT to help.    ROS-  Pt denies SOB, abd pain, CP, N/V/C/D, and vision changes    Objective:   No results found. Recent Labs    07/30/20 0603  WBC 9.3  HGB 8.7*  HCT 27.5*  PLT 301   Recent Labs    07/30/20 0603  NA 135  K 3.7  CL 102  CO2 27  GLUCOSE 104*  BUN 14  CREATININE 0.94  CALCIUM 8.3*    Intake/Output Summary (Last 24 hours) at 07/30/2020 0838 Last data filed at 07/30/2020 0800 Gross per 24 hour  Intake 580 ml  Output 600 ml  Net -20 ml     Physical Exam: Vital Signs Blood pressure (!) 118/58, pulse 71, temperature 98.3 F (36.8 C), resp. rate 20, height 5\' 6"  (1.676 m), weight 97.3 kg, SpO2 96 %. Marland Kitchenzts  General: No acute distress; sitting up in bed- waiting to void; wearing depends, NAD Mood and affect are appropriate Heart: RRR Lungs: CTA B/L- no W/R/R- good air movement Abdomen: Soft, NT, ND, (+)BS  Extremities: No clubbing, cyanosis, or edema Skin: less erythema around the staples but no drainage or tenderness Neurologic: Cranial nerves II through XII intact, motor strength is 5/5 in bilateral deltoid, bicep, tricep, grip, Right hip flexor, knee extensors, ankle dorsiflexor and plantar flexor  Left lower extremity: Hip flexion 2/5 (pain inhibition), ankle dorsiflexion 4+/5. Left hip with edema and tenderness  Psych: Pt's affect is appropriate. Pt is cooperative  Assessment/Plan: 1. Functional deficits secondary to left hip fracture which require 3+ hours per day of interdisciplinary therapy in a comprehensive inpatient rehab setting.  Physiatrist is providing close team supervision and 24 hour management of active medical problems listed below.  Physiatrist and rehab team continue to assess barriers to discharge/monitor patient progress toward  functional and medical goals  Care Tool:  Bathing    Body parts bathed by patient: Right arm, Left arm, Chest, Abdomen, Front perineal area, Right upper leg, Left upper leg, Face   Body parts bathed by helper: Left lower leg, Right lower leg, Buttocks     Bathing assist Assist Level: Maximal Assistance - Patient 24 - 49%     Upper Body Dressing/Undressing Upper body dressing   What is the patient wearing?: Pull over shirt    Upper body assist Assist Level: Moderate Assistance - Patient 50 - 74%    Lower Body Dressing/Undressing Lower body dressing      What is the patient wearing?: Incontinence brief, Pants     Lower body assist Assist for lower body dressing: Maximal Assistance - Patient 25 - 49%     Toileting Toileting    Toileting assist Assist for toileting: Total Assistance - Patient < 25%     Transfers Chair/bed transfer  Transfers assist     Chair/bed transfer assist level: Minimal Assistance - Patient > 75% Chair/bed transfer assistive device: Programmer, multimedia   Ambulation assist      Assist level: Minimal Assistance - Patient > 75% Assistive device: Walker-rolling Max distance: 5   Walk 10 feet activity   Assist  Walk 10 feet activity did not occur: Safety/medical concerns        Walk 50 feet activity   Assist Walk 50 feet with 2 turns activity did not  occur: Safety/medical concerns         Walk 150 feet activity   Assist Walk 150 feet activity did not occur: Safety/medical concerns         Walk 10 feet on uneven surface  activity   Assist Walk 10 feet on uneven surfaces activity did not occur: Safety/medical concerns         Wheelchair     Assist Will patient use wheelchair at discharge?: Yes (Per PT long-term goals) Type of Wheelchair: Manual    Wheelchair assist level: Supervision/Verbal cueing Max wheelchair distance: 50    Wheelchair 50 feet with 2 turns activity    Assist         Assist Level: Supervision/Verbal cueing   Wheelchair 150 feet activity     Assist      Assist Level: Moderate Assistance - Patient 50 - 74%   Blood pressure (!) 118/58, pulse 71, temperature 98.3 F (36.8 C), resp. rate 20, height 5\' 6"  (1.676 m), weight 97.3 kg, SpO2 96 %.  Medical Problem List and Plan: 1.  Deficits with mobility, transfers, self-care secondary to left hip fracture status post IM nail.             -patient may not shower             -ELOS/Goals: 15-19 days/min A             Continue CIR 2. H/o PE/ Antithrombotics: -DVT/anticoagulation:  Pharmaceutical: Xarelto             -antiplatelet therapy: N/A 3. Pain Management: Hydrocodone as needed--uncontrolled increase norco to 7.5mg    q 6h 4. Mood: LCSW to follow for evaluation and support             -antipsychotic agents: N/A 5. Neuropsych: This patient is capable of making decisions on her own behalf. 6. Skin/Wound Care: Monitor wound for healing.  Added protein supplements to promote healing 7. Fluids/Electrolytes/Nutrition: Monitor I's/O.  Encourage fluid intake.  CMP ordered for tomorrow a.m. 8. GERD: Continue Protonix with Carafate as needed indigestion  8/9- no complaints- con't regimen 9. ABLA: Added iron supplement.  Hgb 7.4 on 8/5, repeat Monday.   8/9- Hb 8.7-  10.  Tachycardia: Continue to monitor heart rate/BP 3 times daily.  On Lopressor 12.5 mg twice daily. Well controlled  8/9- BP well controlled- pulse 70s- con't meds             Monitor with increased activity. 11.  Acute renal injury: Encourage fluid intake.               Creatinine much improved to 1.01.   8/9- Cr 0.94 12.  Supplemental oxygen dependent             Wean as tolerated   LOS: 5 days A FACE TO FACE EVALUATION WAS PERFORMED  Stacy Valencia 07/30/2020, 8:38 AM

## 2020-07-31 ENCOUNTER — Inpatient Hospital Stay (HOSPITAL_COMMUNITY): Payer: Medicare Other | Admitting: Occupational Therapy

## 2020-07-31 ENCOUNTER — Inpatient Hospital Stay (HOSPITAL_COMMUNITY): Payer: Medicare Other

## 2020-07-31 LAB — URINALYSIS, ROUTINE W REFLEX MICROSCOPIC
Bilirubin Urine: NEGATIVE
Glucose, UA: NEGATIVE mg/dL
Ketones, ur: NEGATIVE mg/dL
Nitrite: POSITIVE — AB
Protein, ur: NEGATIVE mg/dL
Specific Gravity, Urine: 1.009 (ref 1.005–1.030)
WBC, UA: >50 WBC/hpf — ABNORMAL HIGH (ref 0–5)
pH: 6 (ref 5.0–8.0)

## 2020-07-31 NOTE — Plan of Care (Signed)
  Problem: RH SAFETY Goal: RH STG ADHERE TO SAFETY PRECAUTIONS W/ASSISTANCE/DEVICE Description: STG Adhere to Safety Precautions With Assistance/Device. Outcome: Progressing   Problem: RH KNOWLEDGE DEFICIT GENERAL Goal: RH STG INCREASE KNOWLEDGE OF SELF CARE AFTER HOSPITALIZATION Outcome: Progressing   Problem: RH SAFETY Goal: RH STG ADHERE TO SAFETY PRECAUTIONS W/ASSISTANCE/DEVICE Description: STG Adhere to Safety Precautions With Assistance/Device. Outcome: Progressing   Problem: RH KNOWLEDGE DEFICIT GENERAL Goal: RH STG INCREASE KNOWLEDGE OF SELF CARE AFTER HOSPITALIZATION Outcome: Progressing

## 2020-07-31 NOTE — Patient Care Conference (Signed)
Inpatient RehabilitationTeam Conference and Plan of Care Update Date: 07/31/2020   Time: 11:48 AM    Patient Name: Stacy Valencia      Medical Record Number: [id number]  Date of Birth: 08/15/1933 Sex: Female         Room/Bed: 4M02C/4M02C-01 Payor Info: Payor: MEDICARE / Plan: MEDICARE PART A AND B / Product Type: *No Product type* /    Admit Date/Time:  07/25/2020  6:12 PM  Primary Diagnosis:  Hip fracture [facility])  Hospital Problems: Principal Problem:   Hip fracture [facility]) Active Problems:   Femur fracture, left [facility])    Expected Discharge Date: Expected Discharge Date: 08/14/20  Team Members Present: Physician leading conference: Dr. Courtney Heys Care Coodinator Present: Loralee Pacas, LCSWA;Dyanara Cozza Creig Hines, RN, BSN, CRRN Nurse Present: Ellison Carwin, LPN PT Present: Tereasa Coop, PT OT Present: Lillia Corporal, OT PPS Coordinator present : Ileana Ladd, Burna Mortimer, SLP     Current Status/Progress Goal Weekly Team Focus  Bowel/Bladder   incontinent of bowel and bladder, urinary urgency and retention, last Bm 07/30/20  Improve incontinence, encouraged to call when needs to be toileted  Assess toileting q shift and prn    Swallow/Nutrition/ Hydration             ADL's   Inconsistent, Min/Mod for [facility] transfers, sit to stand Min/Mod, LB ADLs Max/total  Min for LB, Supervision for UB  stand bals/tolerance, sit to stands, ADL transfers, LB bathing/dressing   Mobility   min/modA bed mobility, minA sit to stand, minA 15' with RW. Requires max encouragement. Low pain tolerance.  Supervision  Bed mobility, transfers, ambulation, pain management   Communication             Safety/Cognition/ Behavioral Observations            Pain   Patient complains of pain to left hip, has prn meds   Patient pain level to be less than 5  assess pain q shift and as needed    Skin   Incisions to left hip, staples in place area with redness noted  Monitor for s/s of infection and prevent skin  breakdown  assess skin q shift and prn      Discharge Planning:  Discharge to home with husband who will provide care 24/7. Pt has daughter who will also be able to assist with care.   Team Discussion: Frequent urination with urgency. MD ordered UA. Continent of bowel. Incision looking red and scabbing, MD to observe. Pt in constant pain. However, PT reports pt moves well when pain is not a factor. Patient on target to meet rehab goals: no, pain is limiting at this time.  *See Care Plan and progress notes for long and short-term goals.   Revisions to Treatment Plan:  none  Teaching Needs: Continue family education, repositioning to limit pain, prn medications  Current Barriers to Discharge: Pain  Possible Resolutions to Barriers: Continue PRN medications, consider cold/warm therapy, repositioning, and other techniques that might aid in decreasing pt's pain level.      Medical Summary Current Status: pain still an issue-never controlled- no matter if takes meds-  urinary urgency/dysuria- incision looks OK- but staples area red  Barriers to Discharge: Home enviroment access/layout;Decreased family/caregiver support;Weight;Wound care  Barriers to Discharge Comments: pain still an issue; dysuria- checking U/A and Cx; confused on pain meds- tylenol likely NOT the best for pain control per PT/OT Possible Resolutions to Barriers/Weekly Focus: might need ABX for UTI- always has pain- pain big limiting  factor- refuses BSC; very cloudy urine per PT- min A transfer 15 ft RW;   Continued Need for Acute Rehabilitation Level of Care: The patient requires daily medical management by a physician with specialized training in physical medicine and rehabilitation for the following reasons: Direction of a multidisciplinary physical rehabilitation program to maximize functional independence : Yes Medical management of patient stability for increased activity during participation in an intensive  rehabilitation regime.: Yes Analysis of laboratory values and/or radiology reports with any subsequent need for medication adjustment and/or medical intervention. : Yes   I attest that I was present, lead the team conference, and concur with the assessment and plan of the team.   Cristi Loron 07/31/2020, 5:09 PM

## 2020-07-31 NOTE — Progress Notes (Signed)
Patient ID: Oneita Allmon, female   DOB: 05/07/1933, 86 y.o.   MRN: [id number]  SW met with pt and pt husband in room to discuss updates from team conference, and d/c date 8/24. Pt husband states he will be her primary caregiver, and her daughter will  help as well. Pt husband able to come in for fam edu on 8/18 and 8/19 9am until therapy is completed.   Loralee Pacas, MSW, [place] Office: [phone] Cell: [phone] Fax: [phone]

## 2020-07-31 NOTE — Progress Notes (Signed)
Physical Therapy Session Note  Patient Details  Name: Stacy Valencia MRN: [id number] Date of Birth: 31-Mar-1933  Today's Date: 07/31/2020 PT Individual Time: 4628-6381 and 1418-1530 PT Individual Time Calculation (min): 43 min and 72 min  Short Term Goals: Week 1:  PT Short Term Goal 1 (Week 1): pt will perform bed mobility with mod assist PT Short Term Goal 2 (Week 1): Pt will ambulate 42ft with mod assist and LRAD PT Short Term Goal 3 (Week 1): Pt will propell WC x 121ft with supervision assist PT Short Term Goal 4 (Week 1): pt will transfer to [facility] with mod assist  Skilled Therapeutic Interventions/Progress Updates:   1st Session: Pt received supine and reports excruciating pain in L hip as well as low back/pelvis. Number not provided. PT repositions and provides mobility and rest breaks to manage pain. Pt attempts to refuse therapy but is agreeable with firm encouragement. Supine to sit with PT providing minA to manage LLE and pt using bed rails. Sit to stand with RW and minA. Pt ambulates 15' to toilet with minA. Pt very verbal about amount of pain she is in but mobilizes suprisingly well, considering pain complaints. Pt is continent of urine in toilet and requires modA to stand from standard height toilet. Ambulates 10' to [facility] with minA. WC transport to therapy gym for time management. Pt performs kinetron for 5 minutes on minimal resistance. Performed for AAROM of LLE and pain management. WC transport back to room and pt encouraged to stay sitting for 1 hour.   2nd Session: Pt still sitting up in WC from AM session. Received pain meds prior to therapy. Still reports pain especially in LLE. Number not provided. PT repositions and provides extended rest breaks as needed. WC transport to dayroom for time management. SPT to mat table with minA and RW. Sit to supine with miNA management of LLE. Pt performs following therex with AAROM 1x15 to strengthen LLE and improve ROM: Hip Abduction Heel  Slides SAQs  Sit to supine with minA management of LLE. Pt ambulates 30' x2 with extended seated rest break and minA. Pt also self propels WC x80' with minA due to tendency to veer to the L.   Pt requires max encouragement to perform all mobility, frequently saying she cannot perform activity before and during actively completing task.   Pt left seated in WC with BLEs elevated, alarm intact, and all needs within reach.   Therapy Documentation Precautions:  Precautions Precautions: Fall Restrictions Weight Bearing Restrictions: Yes LLE Weight Bearing: Weight bearing as tolerated    Therapy/Group: Individual Therapy  Breck Coons, PT, DPT 07/31/2020, 3:47 PM

## 2020-07-31 NOTE — Progress Notes (Signed)
[place] PHYSICAL MEDICINE & REHABILITATION PROGRESS NOTE   Subjective/Complaints:  LBM yesterday. Feeling rough due to pain.   Just voided in diaper since going so frequently- has urgency, frequency and pain with voiding- will check U/A and Cx.  Slept well for the first night.    ROS-   Pt denies SOB, abd pain, CP, N/V/C/D, and vision changes   Objective:   No results found. Recent Labs    07/30/20 0603  WBC 9.3  HGB 8.7*  HCT 27.5*  PLT 301   Recent Labs    07/30/20 0603  NA 135  K 3.7  CL 102  CO2 27  GLUCOSE 104*  BUN 14  CREATININE 0.94  CALCIUM 8.3*    Intake/Output Summary (Last 24 hours) at 07/31/2020 0830 Last data filed at 07/30/2020 1834 Gross per 24 hour  Intake 540 ml  Output 200 ml  Net 340 ml     Physical Exam: Vital Signs Blood pressure 126/65, pulse 78, temperature 97.8 F (36.6 C), temperature source Oral, resp. rate 18, height 5\' 6"  (1.676 m), weight 97.3 kg, SpO2 96 %. Marland Kitchenzts  General: No acute distress; sitting up in bed- said voided in diaper just now- said was painful, NAD Mood and affect are appropriate Heart: RRR Lungs: CTA B/L- no W/R/R- good air movement Abdomen: Soft, NT, ND, (+)BS  Extremities: No clubbing, cyanosis, or edema Skin: has a few scabs around incision- staples intact Neurologic: Cranial nerves II through XII intact, motor strength is 5/5 in bilateral deltoid, bicep, tricep, grip, Right hip flexor, knee extensors, ankle dorsiflexor and plantar flexor  Left lower extremity: Hip flexion 2/5 (pain inhibition), ankle dorsiflexion 4+/5. Left hip with edema and tenderness  Psych: Pt's affect is appropriate. Pt is cooperative  Assessment/Plan: 1. Functional deficits secondary to left hip fracture which require 3+ hours per day of interdisciplinary therapy in a comprehensive inpatient rehab setting.  Physiatrist is providing close team supervision and 24 hour management of active medical problems listed  below.  Physiatrist and rehab team continue to assess barriers to discharge/monitor patient progress toward functional and medical goals  Care Tool:  Bathing    Body parts bathed by patient: Right arm, Left arm, Chest, Abdomen, Front perineal area, Right upper leg, Left upper leg, Face   Body parts bathed by helper: Left lower leg, Right lower leg, Buttocks     Bathing assist Assist Level: Maximal Assistance - Patient 24 - 49%     Upper Body Dressing/Undressing Upper body dressing   What is the patient wearing?: Pull over shirt    Upper body assist Assist Level: Moderate Assistance - Patient 50 - 74%    Lower Body Dressing/Undressing Lower body dressing      What is the patient wearing?: Pants, Incontinence brief     Lower body assist Assist for lower body dressing: Total Assistance - Patient < 25%     Toileting Toileting    Toileting assist Assist for toileting: Total Assistance - Patient < 25%     Transfers Chair/bed transfer  Transfers assist     Chair/bed transfer assist level: Minimal Assistance - Patient > 75% Chair/bed transfer assistive device: Programmer, multimedia   Ambulation assist      Assist level: Minimal Assistance - Patient > 75% Assistive device: Walker-rolling Max distance: 15'   Walk 10 feet activity   Assist  Walk 10 feet activity did not occur: Safety/medical concerns  Assist level: Minimal Assistance - Patient > 75%  Assistive device: Walker-rolling   Walk 50 feet activity   Assist Walk 50 feet with 2 turns activity did not occur: Safety/medical concerns         Walk 150 feet activity   Assist Walk 150 feet activity did not occur: Safety/medical concerns         Walk 10 feet on uneven surface  activity   Assist Walk 10 feet on uneven surfaces activity did not occur: Safety/medical concerns         Wheelchair     Assist Will patient use wheelchair at discharge?: Yes (Per PT long-term  goals) Type of Wheelchair: Manual    Wheelchair assist level: Supervision/Verbal cueing Max wheelchair distance: 50    Wheelchair 50 feet with 2 turns activity    Assist        Assist Level: Supervision/Verbal cueing   Wheelchair 150 feet activity     Assist      Assist Level: Moderate Assistance - Patient 50 - 74%   Blood pressure 126/65, pulse 78, temperature 97.8 F (36.6 C), temperature source Oral, resp. rate 18, height 5\' 6"  (1.676 m), weight 97.3 kg, SpO2 96 %.  Medical Problem List and Plan: 1.  Deficits with mobility, transfers, self-care secondary to left hip fracture status post IM nail.  8/10- surgery was 8/2- needs staples out 8/16.              -patient may not shower             -ELOS/Goals: 15-19 days/min A             Continue CIR 2. H/o PE/ Antithrombotics: -DVT/anticoagulation:  Pharmaceutical: Xarelto             -antiplatelet therapy: N/A 3. Pain Management: Hydrocodone as needed--uncontrolled increase norco to 7.5mg    q 6h 4. Mood: LCSW to follow for evaluation and support  8/10- still having pain issues- said tylenol does better than Norco- will con't tylenol, tramadol, and Norco 1-2 tabs q6 hours prn              -antipsychotic agents: N/A 5. Neuropsych: This patient is capable of making decisions on her own behalf. 6. Skin/Wound Care: Monitor wound for healing.  Added protein supplements to promote healing 7. Fluids/Electrolytes/Nutrition: Monitor I's/O.  Encourage fluid intake.  CMP ordered for tomorrow a.m. 8. GERD: Continue Protonix with Carafate as needed indigestion  8/9- no complaints- con't regimen 9. ABLA: Added iron supplement.  Hgb 7.4 on 8/5, repeat Monday.   8/9- Hb 8.7-  10.  Tachycardia: Continue to monitor heart rate/BP 3 times daily.  On Lopressor 12.5 mg twice daily. Well controlled  8/9- BP well controlled- pulse 70s- con't meds             Monitor with increased activity. 11.  Acute renal injury: Encourage fluid  intake.               Creatinine much improved to 1.01.   8/9- Cr 0.94 12.  Supplemental oxygen dependent             Wean as tolerated 13. Dysuria  8/10- will check U/A and Cx due to dysuria, frequency and urgency.     LOS: 6 days A FACE TO FACE EVALUATION WAS PERFORMED  Ashland Wiseman 07/31/2020, 8:30 AM

## 2020-07-31 NOTE — Progress Notes (Signed)
Occupational Therapy Session Note  Patient Details  Name: Stacy Valencia MRN: [id number] Date of Birth: 09/26/1933  Today's Date: 07/31/2020 OT Individual Time: 9983-3825 OT Individual Time Calculation (min): 72 min    Short Term Goals: Week 1:  OT Short Term Goal 1 (Week 1): Pt will complete sit<>stand with max A of 1 OT Short Term Goal 2 (Week 1): Pt will tolerate sitting EOB for 5 mins with no more than min A OT Short Term Goal 3 (Week 1): Pt will complete toilet transfer with max A of 1  Skilled Therapeutic Interventions/Progress Updates:    Pt greeted at time of session supine in bed, stating she wanted to sleep and rest although she had just woken up. Able to participate with Mod encouragement. Expressed need to urinate, encouraged to use toilet but insisted on urinal, stating she needed to urinate a large amount but only a small amount was produced. Supine to sit EOB with gait belt as leg lifter with Min A. Sit to stand Mod from bed and SPT to wheelchair with Min A with RW, extended time required to maneuver BLEs during turn and pt continuously moans during all movement/standing activity. Pt had been premedicated, rest breaks provided throughout for pain, no number given. Set up at sink for UB/LB bathing with UB bathing supervision/set up and donning new shirt in the same manner. Oral hygiene and grooming supervision seated. Declined LB bathing buttocks and periarea stating the nurses clean it whenever they change her. LB dressing with total A, assist to thread over feet (less sensitivity to touch today), and Min A to stand from wheelchair, static stand for therapist to pull up and don over hips with pt standing CGA, transferred to bed with CGA with RW and extended time. Extended time required for all tasks d/t pain and rest breaks, verbal cues for pacing and problem solving. Sit to supine Mod/Max to manage BLEs and scooted up in bed Min. Alarm on, call bell in reach.    Therapy  Documentation Precautions:  Precautions Precautions: Fall Restrictions Weight Bearing Restrictions: Yes LLE Weight Bearing: Weight bearing as tolerated     Therapy/Group: Individual Therapy  Viona Gilmore 07/31/2020, 9:14 AM

## 2020-08-01 ENCOUNTER — Inpatient Hospital Stay (HOSPITAL_COMMUNITY): Payer: Medicare Other

## 2020-08-01 ENCOUNTER — Inpatient Hospital Stay (HOSPITAL_COMMUNITY): Payer: Medicare Other | Admitting: Occupational Therapy

## 2020-08-01 MED ORDER — CEPHALEXIN 250 MG PO CAPS
500.0000 mg | ORAL_CAPSULE | Freq: Two times a day (BID) | ORAL | Status: DC
  Administered 2020-08-01 – 2020-08-02 (×3): 500 mg via ORAL
  Filled 2020-08-01 (×3): qty 2

## 2020-08-01 NOTE — Plan of Care (Signed)
  Problem: RH BLADDER ELIMINATION Goal: RH STG MANAGE BLADDER WITH EQUIPMENT WITH ASSISTANCE Description: STG Manage Bladder With Equipment With Assistance Outcome: Progressing   Problem: RH PAIN MANAGEMENT Goal: RH STG PAIN MANAGED AT OR BELOW PT'S PAIN GOAL Outcome: Progressing   Problem: RH KNOWLEDGE DEFICIT GENERAL Goal: RH STG INCREASE KNOWLEDGE OF SELF CARE AFTER HOSPITALIZATION Outcome: Progressing

## 2020-08-01 NOTE — Progress Notes (Signed)
Occupational Therapy Weekly Progress Note  Patient Details  Name: Stacy Valencia MRN: [id number] Date of Birth: [birth year]  Beginning of progress report period: July 26, 2020 End of progress report period: August 01, 2020  Today's Date: 08/01/2020 OT Individual Time: 2725-3664 and 4034-7425 OT Individual Time Calculation (min): 58 min and 57 min   Patient has met 3 of 3 short term goals.  Pt is currently Min with sit to stands (inconsistent d/t pain and level of encouragement needed), Max-total for LB dressing and bathing but is able to static stand with RW for therapist to assist, transfers with Min A with RW. Pain in L hip/knee is a limiting factor.   Patient continues to demonstrate the following deficits: muscle weakness, decreased cardiorespiratoy endurance,   and decreased sitting balance, decreased standing balance, decreased postural control and decreased balance strategies and therefore will continue to benefit from skilled OT intervention to enhance overall performance with BADL and Reduce care partner burden.  Patient progressing toward long term goals..  Continue plan of care.  OT Short Term Goals Week 1:  OT Short Term Goal 1 (Week 1): Pt will complete sit<>stand with max A of 1 OT Short Term Goal 1 - Progress (Week 1): Met OT Short Term Goal 2 (Week 1): Pt will tolerate sitting EOB for 5 mins with no more than min A OT Short Term Goal 2 - Progress (Week 1): Met OT Short Term Goal 3 (Week 1): Pt will complete toilet transfer with max A of 1 OT Short Term Goal 3 - Progress (Week 1): Met Week 2:  OT Short Term Goal 1 (Week 2): Pt will complete toilet transfers with Min A OT Short Term Goal 2 (Week 2): Pt will perform LB dressing with AE with Mod A OT Short Term Goal 3 (Week 2): Pt will peform sit <> stand consistently with Min A OT Short Term Goal 4 (Week 2): Pt will improve endurance to perform ADL for 10+ mins with no rest break  Skilled Therapeutic Interventions/Progress  Updates:    Session 1: Pt greeted at time of session supine in bed resting, requiring encouragement to participate in OT session. C/o fatigue and pain limiting in L hip, no number given but rest breaks and repositioning provided. Supine to sit EOB with Min A, improved ability to maneuver LLE this date compared to previous sessions to sit EOB. Sit to stand Min/Mod with RW and SPT to wheelchair in the same manner with pt exclaiming throughout transfer but able to perform well with RW, cues to control descent for comfort. Set up at sink level and performed seated oral hygiene and grooming with set up. When asked if she wanted to change clothes, pt has no desire to change clothes but encouraged to do so. Don/doffed shirt with supervision and washing axilla area in the same manner. C/o back pain and provided lumbar support with rolled up towel. Also provided hot packs for 10 mins, skin in tact pre and post to improve low back pain. Expressed need to urinate, doffed brief and placed female urinal (declined using the toilet d/t urgency) and only voided a few drops of urine. Repeatedly declined donning pants, set up on her bed for future session. Pt up in wheelchair with alarm on, positioned for comfort, call bell in reach.  Session 2: Pt greeted at time of session sitting up in wheelchair agreeable to OT session with encouragement, saying she was very tired and fatigued. Extensive collaboration with pt's husband who was  also present regarding home layout/set up, and are planning to use guest bathroom with tub/shower combo with TTB. Deciding if to purchase indep or via hospital. Pt brought to bathroom via wheelchair for time management and after therapist demonstration performed SPT wheelchair <> TTB with Min A, but firmly declined bringing BLEs over tub wall today. Once back in room, moist heat applied to low back d/t discomfort for 10 minutes, skin in tact pre and post heat. Extended rest breaks required throughout  session for pain/recovery after all activity. SPT chair > bed with Min/CGA with RW. Sit to supine Mod A to manage BLEs. Positioned for comfort, call bell in reach, all needs met.   Therapy Documentation Precautions:  Precautions Precautions: Fall Restrictions Weight Bearing Restrictions: Yes LLE Weight Bearing: Weight bearing as tolerated     Therapy/Group: Individual Therapy  Viona Gilmore 08/01/2020, 7:41 AM

## 2020-08-01 NOTE — Progress Notes (Signed)
Physical Therapy Session Note  Patient Details  Name: Stacy Valencia MRN: [id number] Date of Birth: 11/10/1933  Today's Date: 08/01/2020 PT Missed Time: 40 Minutes Missed Time Reason: Patient fatigue;Patient unwilling to participate  Pt sleeping upon PT arrival and when awoken refuses therapy this afternoon due to fatigue and asking to rest. PT will continue to follow   Therapy/Group: Individual Therapy  Breck Coons, PT, DPT 08/01/2020, 3:40 PM

## 2020-08-01 NOTE — Progress Notes (Signed)
[place] PHYSICAL MEDICINE & REHABILITATION PROGRESS NOTE   Subjective/Complaints:  LBM 2 days ago- says needed to go- explained has prns if needed.    Asking if staples can come out- explained surgery was 8/2- don' usually remove until 8/16- since 14 days- has only been 9 days now.    ROS-    Pt denies SOB, abd pain, CP, N/V/C/D, and vision changes   Objective:   No results found. Recent Labs    07/30/20 0603  WBC 9.3  HGB 8.7*  HCT 27.5*  PLT 301   Recent Labs    07/30/20 0603  NA 135  K 3.7  CL 102  CO2 27  GLUCOSE 104*  BUN 14  CREATININE 0.94  CALCIUM 8.3*    Intake/Output Summary (Last 24 hours) at 08/01/2020 0810 Last data filed at 08/01/2020 0710 Gross per 24 hour  Intake 480 ml  Output 350 ml  Net 130 ml     Physical Exam: Vital Signs Blood pressure (!) 109/56, pulse 83, temperature 97.9 F (36.6 C), temperature source Oral, resp. rate 20, height 5\' 6"  (1.676 m), weight 97.3 kg, SpO2 98 %. Marland Kitchenzts  General: No acute distress; laying in bed- appropriate, supine, NAD Mood and affect are appropriate Heart: RRR Lungs: CTA B/L- no W/R/R- good air movement Abdomen: Soft, NT, ND, (+)BS  Extremities: No clubbing, cyanosis, or edema Skin: increased erythema around staples- starting to look slightly cellulitic- staples intact Neurologic: Cranial nerves II through XII intact, motor strength is 5/5 in bilateral deltoid, bicep, tricep, grip, Right hip flexor, knee extensors, ankle dorsiflexor and plantar flexor  Left lower extremity: Hip flexion 2/5 (pain inhibition), ankle dorsiflexion 4+/5. Left hip with edema and tenderness  Psych: Pt's affect is appropriate. Pt is cooperative  Assessment/Plan: 1. Functional deficits secondary to left hip fracture which require 3+ hours per day of interdisciplinary therapy in a comprehensive inpatient rehab setting.  Physiatrist is providing close team supervision and 24 hour management of active medical problems  listed below.  Physiatrist and rehab team continue to assess barriers to discharge/monitor patient progress toward functional and medical goals  Care Tool:  Bathing    Body parts bathed by patient: Right arm, Left arm, Chest, Abdomen, Right upper leg, Left upper leg, Face   Body parts bathed by helper: Left lower leg, Right lower leg, Buttocks     Bathing assist Assist Level: Maximal Assistance - Patient 24 - 49%     Upper Body Dressing/Undressing Upper body dressing   What is the patient wearing?: Pull over shirt    Upper body assist Assist Level: Supervision/Verbal cueing    Lower Body Dressing/Undressing Lower body dressing      What is the patient wearing?: Pants     Lower body assist Assist for lower body dressing: Total Assistance - Patient < 25%     Toileting Toileting    Toileting assist Assist for toileting: Total Assistance - Patient < 25%     Transfers Chair/bed transfer  Transfers assist     Chair/bed transfer assist level: Minimal Assistance - Patient > 75% Chair/bed transfer assistive device: Programmer, multimedia   Ambulation assist      Assist level: Minimal Assistance - Patient > 75% Assistive device: Walker-rolling Max distance: 30'   Walk 10 feet activity   Assist  Walk 10 feet activity did not occur: Safety/medical concerns  Assist level: Minimal Assistance - Patient > 75% Assistive device: Walker-rolling   Walk 50 feet activity  Assist Walk 50 feet with 2 turns activity did not occur: Safety/medical concerns         Walk 150 feet activity   Assist Walk 150 feet activity did not occur: Safety/medical concerns         Walk 10 feet on uneven surface  activity   Assist Walk 10 feet on uneven surfaces activity did not occur: Safety/medical concerns         Wheelchair     Assist Will patient use wheelchair at discharge?: Yes (Per PT long-term goals) Type of Wheelchair: Manual     Wheelchair assist level: Supervision/Verbal cueing Max wheelchair distance: 50    Wheelchair 50 feet with 2 turns activity    Assist        Assist Level: Supervision/Verbal cueing   Wheelchair 150 feet activity     Assist      Assist Level: Moderate Assistance - Patient 50 - 74%   Blood pressure (!) 109/56, pulse 83, temperature 97.9 F (36.6 C), temperature source Oral, resp. rate 20, height 5\' 6"  (1.676 m), weight 97.3 kg, SpO2 98 %.  Medical Problem List and Plan: 1.  Deficits with mobility, transfers, self-care secondary to left hip fracture status post IM nail.  8/10- surgery was 8/2- needs staples out 8/16.              8/11- staple area look a little cellulitic-adding Keflex for UTI- will also cover skin bacteria- will monitor              -ELOS/Goals: 15-19 days/min A             Continue CIR 2. H/o PE/ Antithrombotics: -DVT/anticoagulation:  Pharmaceutical: Xarelto             -antiplatelet therapy: N/A 3. Pain Management: Hydrocodone as needed--uncontrolled increase norco to 7.5mg    q 6h 4. Mood: LCSW to follow for evaluation and support  8/10- still having pain issues- said tylenol does better than Norco- will con't tylenol, tramadol, and Norco 1-2 tabs q6 hours prn              -antipsychotic agents: N/A 5. Neuropsych: This patient is capable of making decisions on her own behalf. 6. Skin/Wound Care: Monitor wound for healing.  Added protein supplements to promote healing 7. Fluids/Electrolytes/Nutrition: Monitor I's/O.  Encourage fluid intake.  CMP ordered for tomorrow a.m.  8/11- Cr 0.94- con't to monitor 8. GERD: Continue Protonix with Carafate as needed indigestion  8/9- no complaints- con't regimen 9. ABLA: Added iron supplement.  Hgb 7.4 on 8/5, repeat Monday.   8/9- Hb 8.7-  10.  Tachycardia: Continue to monitor heart rate/BP 3 times daily.  On Lopressor 12.5 mg twice daily. Well controlled  8/9- BP well controlled- pulse 70s- con't meds              Monitor with increased activity. 11.  Acute renal injury: Encourage fluid intake.               Creatinine much improved to 1.01.   8/9- Cr 0.94 12.  Supplemental oxygen dependent             Wean as tolerated 13. Dysuria/UTI  8/10- will check U/A and Cx due to dysuria, frequency and urgency.  8/11- Has large leuks and (+) nitrites and WBC >50- will start Keflex 500 mg BID- is allergic to sulfa.      LOS: 7 days A FACE TO FACE EVALUATION WAS PERFORMED  Athony Coppa  08/01/2020, 8:10 AM

## 2020-08-01 NOTE — Progress Notes (Signed)
Physical Therapy Session Note  Patient Details  Name: Stacy Valencia MRN: [id number] Date of Birth: January 27, 1933  Today's Date: 08/01/2020 PT Individual Time: 1101-1200 PT Individual Time Calculation (min): 59 min   Short Term Goals: Week 1:  PT Short Term Goal 1 (Week 1): pt will perform bed mobility with mod assist PT Short Term Goal 2 (Week 1): Pt will ambulate 27ft with mod assist and LRAD PT Short Term Goal 3 (Week 1): Pt will propell WC x 166ft with supervision assist PT Short Term Goal 4 (Week 1): pt will transfer to [facility] with mod assist  Skilled Therapeutic Interventions/Progress Updates:   Pt received seated in [facility] and agreeable to therapy. Reports significant pain in L hip and low back. Number not provided. PT provides repositioning, mobility, and rest breaks to manage pain symptoms. WC transport to gym for time management. SPT to Nustep with modA due to keeping weight in heels and posterior LOB requiring manual support at hips and trunk to prevent fall.   Pt completes 8:00 at workload of 3 with SPM>20 on Nustep. Performed for strength and endurance training as well as AAROM of LLE to decrease stiffness and pain.   STS from Nustep with minA and pt ambulates 15' to mat table with minA and RW. Pt performs NMR for standing balance, using single UE support on RW and tossing beanbag with LUE in cornhole game. Pt reaches outside BOS with LUE to retrieve bag, encouraging weight shift to L side.   Pt ambulates x2 bouts of 40' with RW and minA, requiring constant encouragement due to complaints of "I can't" and "I need to sit down" from the moment pt stands up.   Pt left seated in WC and encouraged to sit up for at least 1 hour. Alarm intact and all needs within reach.  Therapy Documentation Precautions:  Precautions Precautions: Fall Restrictions Weight Bearing Restrictions: No LLE Weight Bearing: Weight bearing as tolerated    Therapy/Group: Individual Therapy  Breck Coons, PT,  DPT  08/01/2020, 12:10 PM

## 2020-08-02 ENCOUNTER — Inpatient Hospital Stay (HOSPITAL_COMMUNITY): Payer: Medicare Other

## 2020-08-02 ENCOUNTER — Inpatient Hospital Stay (HOSPITAL_COMMUNITY): Payer: Medicare Other | Admitting: Occupational Therapy

## 2020-08-02 LAB — URINE CULTURE: Culture: >=100000 — AB

## 2020-08-02 MED ORDER — NITROFURANTOIN MONOHYD MACRO 100 MG PO CAPS
100.0000 mg | ORAL_CAPSULE | Freq: Two times a day (BID) | ORAL | Status: DC
  Administered 2020-08-02: 100 mg via ORAL
  Filled 2020-08-02 (×2): qty 1

## 2020-08-02 NOTE — Progress Notes (Signed)
Physical Therapy Weekly Progress Note  Patient Details  Name: Stacy Valencia MRN: [id number] Date of Birth: [birth year]  Beginning of progress report period: July 26, 2020 End of progress report period: August 02, 2020  Today's Date: 08/02/2020 PT Individual Time: 1102-1202 PT Individual Time Calculation (min): 60 min   Patient has met 3 of 4 short term goals.  Pt is progressing toward long term therapy goals, improving independence with bed mobility, functional transfers, and ambulation. Pt is overall at minA level, ambulating up to 40' with RW and manual support at hips for safety. Pt pain tolerance is very poor, however, and she requires max encouragement to participate in each PT session. Pt continues to present with deficits in strength, bed mobility, balance, transfers, and ambulation. Stair training will be one of the primary focuses of coming week as she has 4 steps to enter home and husband is looking into installing biltaeral hand rails.  Patient continues to demonstrate the following deficits muscle weakness, decreased cardiorespiratoy endurance and decreased sitting balance, decreased standing balance, decreased postural control and decreased balance strategies and therefore will continue to benefit from skilled PT intervention to increase functional independence with mobility.  Patient progressing toward long term goals..  Continue plan of care.  PT Short Term Goals Week 1:  PT Short Term Goal 1 (Week 1): pt will perform bed mobility with mod assist PT Short Term Goal 1 - Progress (Week 1): Met PT Short Term Goal 2 (Week 1): Pt will ambulate 49f with mod assist and LRAD PT Short Term Goal 2 - Progress (Week 1): Met PT Short Term Goal 3 (Week 1): Pt will propell WC x [id number] supervision assist PT Short Term Goal 3 - Progress (Week 1): Progressing toward goal PT Short Term Goal 4 (Week 1): [facility] Short Term Goal 4 - Progress (Week 1): Met Week  2:  PT Short Term Goal 1 (Week 2): Pt will ambulate 306with CGA PT Short Term Goal 2 (Week 2): Pt will complete 4 steps with BHRs and minA PT Short Term Goal 3 (Week 2): Pt will perform bed to chair transfer with CGA  Skilled Therapeutic Interventions/Progress Updates:  Ambulation/gait training;Community reintegration;DME/adaptive equipment instruction;Neuromuscular re-education;Psychosocial support;Stair training;UE/LE Strength taining/ROM;Wheelchair propulsion/positioning;UE/LE Coordination activities;Therapeutic Activities;Skin care/wound management;Pain management;Functional electrical stimulation;Discharge planning;Balance/vestibular training;Cognitive remediation/compensation;Disease management/prevention;Functional mobility training;Patient/family education;Splinting/orthotics;Therapeutic Exercise;Visual/perceptual remediation/compensation   Pt received seated in WC and agreeable to therapy. Reports pain in L hip and pelvis, as well as low back. Number not provided. Husband reports pt had received pain meds 30 minutes prior to therapy session. WC transport to dayroom for energy conservation.  Pt performs NMR for standing balance and tolerance, with Wii bowling activity. Pt performs multiple reps of sit to stand with minA for stability and to encourage anterior weight shift. In standing, pt uses LUE support on RW and RUE for bowling, with PT providing CGA at hips. Pt able to stand for 1-2 minutes at a time and requires frequent extended rest breaks.  Pt performs stair step training in // bars with 4 inch step. Pt is able to complete x2 bouts of x4 steps with minA from PT, stepping up with RLE and then down and back with LLE. Pt requires extended seated rest break between bouts.  Pt ambulates x55' with minA +1 and +2 for WC follow. Starting at ~5' pt requests repeatedly to sit back down and PT redirects pt energy and focus toward ambulation. No buckling noted  and step-through gait pattern  utilized.  Pt remains seated in WC with alarm intact and all needs within reach.  Therapy Documentation Precautions:  Precautions Precautions: Fall Restrictions Weight Bearing Restrictions: No LLE Weight Bearing: Weight bearing as tolerated:     Therapy/Group: Individual Therapy  Breck Coons, PT, DPT 08/02/2020, 12:10 PM

## 2020-08-02 NOTE — Progress Notes (Signed)
Every other staple removed from left hip/thigh incisions. Moderate amount of dried serosanguinous drainage present to hip incision. Cleansed and softened with NS prior to staple removal. Patient tolerated well with minimal discomfort. Incisions LOTA, ice reapplied.

## 2020-08-02 NOTE — Progress Notes (Signed)
[place] PHYSICAL MEDICINE & REHABILITATION PROGRESS NOTE   Subjective/Complaints:  Pt reports voiding- doesn't burn as much as it did- also peeing about the same amount.   Wondering if some staples can come out.  Explained today is day 10- so will remove every other staple.    ROS-   Pt denies SOB, abd pain, CP, N/V/C/D, and vision changes  Objective:   No results found. No results for input(s): WBC, HGB, HCT, PLT in the last 72 hours. No results for input(s): NA, K, CL, CO2, GLUCOSE, BUN, CREATININE, CALCIUM in the last 72 hours.  Intake/Output Summary (Last 24 hours) at 08/02/2020 0821 Last data filed at 08/02/2020 0732 Gross per 24 hour  Intake 760 ml  Output 1200 ml  Net -440 ml     Physical Exam: Vital Signs Blood pressure 135/65, pulse 87, temperature 97.9 F (36.6 C), resp. rate 18, height 5\' 6"  (1.676 m), weight 97.3 kg, SpO2 97 %. Marland Kitchenzts  General: No acute distress; laying in bed- appropriate, less pain complaints, NAD Psych: Mood and affect are appropriate Heart: RRR Lungs: CTA B/L- no W/R/R- good air movement Abdomen: Soft, NT, ND, (+)BS   Extremities: No clubbing, cyanosis, or edema Skin: stable to slightly worse erythema surrounding staples- deeper pink- slightly cellulitic appearance Neurologic: Cranial nerves II through XII intact, motor strength is 5/5 in bilateral deltoid, bicep, tricep, grip, Right hip flexor, knee extensors, ankle dorsiflexor and plantar flexor  Left lower extremity: Hip flexion 2/5 (pain inhibition), ankle dorsiflexion 4+/5. Left hip with edema and tenderness    Assessment/Plan: 1. Functional deficits secondary to left hip fracture which require 3+ hours per day of interdisciplinary therapy in a comprehensive inpatient rehab setting.  Physiatrist is providing close team supervision and 24 hour management of active medical problems listed below.  Physiatrist and rehab team continue to assess barriers to discharge/monitor patient  progress toward functional and medical goals  Care Tool:  Bathing    Body parts bathed by patient: Right arm, Left arm, Chest, Abdomen, Right upper leg, Left upper leg, Face   Body parts bathed by helper: Left lower leg, Right lower leg, Buttocks     Bathing assist Assist Level: Maximal Assistance - Patient 24 - 49%     Upper Body Dressing/Undressing Upper body dressing   What is the patient wearing?: Pull over shirt    Upper body assist Assist Level: Supervision/Verbal cueing    Lower Body Dressing/Undressing Lower body dressing      What is the patient wearing?: Pants     Lower body assist Assist for lower body dressing: Total Assistance - Patient < 25%     Toileting Toileting    Toileting assist Assist for toileting: Total Assistance - Patient < 25%     Transfers Chair/bed transfer  Transfers assist     Chair/bed transfer assist level: Minimal Assistance - Patient > 75% Chair/bed transfer assistive device: Programmer, multimedia   Ambulation assist      Assist level: Minimal Assistance - Patient > 75% Assistive device: Walker-rolling Max distance: 40'   Walk 10 feet activity   Assist  Walk 10 feet activity did not occur: Safety/medical concerns  Assist level: Minimal Assistance - Patient > 75% Assistive device: Walker-rolling   Walk 50 feet activity   Assist Walk 50 feet with 2 turns activity did not occur: Safety/medical concerns         Walk 150 feet activity   Assist Walk 150 feet activity did  not occur: Safety/medical concerns         Walk 10 feet on uneven surface  activity   Assist Walk 10 feet on uneven surfaces activity did not occur: Safety/medical concerns         Wheelchair     Assist Will patient use wheelchair at discharge?: Yes (Per PT long-term goals) Type of Wheelchair: Manual    Wheelchair assist level: Supervision/Verbal cueing Max wheelchair distance: 50    Wheelchair 50 feet with  2 turns activity    Assist        Assist Level: Supervision/Verbal cueing   Wheelchair 150 feet activity     Assist      Assist Level: Moderate Assistance - Patient 50 - 74%   Blood pressure 135/65, pulse 87, temperature 97.9 F (36.6 C), resp. rate 18, height 5\' 6"  (1.676 m), weight 97.3 kg, SpO2 97 %.  Medical Problem List and Plan: 1.  Deficits with mobility, transfers, self-care secondary to left hip fracture status post IM nail.  8/10- surgery was 8/2- needs staples out 8/16.              8/11- staple area look a little cellulitic-adding Keflex for UTI- will also cover skin bacteria- will monitor   8/12- will remove every OTHER staple- placed order and let nursing know verbally             -ELOS/Goals: 15-19 days/min A             Continue CIR 2. H/o PE/ Antithrombotics: -DVT/anticoagulation:  Pharmaceutical: Xarelto             -antiplatelet therapy: N/A 3. Pain Management: Hydrocodone as needed--uncontrolled increase norco to 7.5mg    q 6h 4. Mood: LCSW to follow for evaluation and support  8/10- still having pain issues- said tylenol does better than Norco- will con't tylenol, tramadol, and Norco 1-2 tabs q6 hours prn   8/12- fewer pain complaints today- missed some therapy yesterday due to nap             -antipsychotic agents: N/A 5. Neuropsych: This patient is capable of making decisions on her own behalf. 6. Skin/Wound Care: Monitor wound for healing.  Added protein supplements to promote healing 7. Fluids/Electrolytes/Nutrition: Monitor I's/O.  Encourage fluid intake.  CMP ordered for tomorrow a.m.  8/11- Cr 0.94- con't to monitor 8. GERD: Continue Protonix with Carafate as needed indigestion  8/9- no complaints- con't regimen 9. ABLA: Added iron supplement.  Hgb 7.4 on 8/5, repeat Monday.   8/9- Hb 8.7-  10.  Tachycardia: Continue to monitor heart rate/BP 3 times daily.  On Lopressor 12.5 mg twice daily. Well controlled  8/9- BP well controlled- pulse 70s-  con't meds             Monitor with increased activity. 11.  Acute renal injury: Encourage fluid intake.               Creatinine much improved to 1.01.   8/9- Cr 0.94 12.  Supplemental oxygen dependent             Wean as tolerated 13. Dysuria/UTI  8/10- will check U/A and Cx due to dysuria, frequency and urgency.  8/11- Has large leuks and (+) nitrites and WBC >50- will start Keflex 500 mg BID- is allergic to sulfa.  8/12- >100k GNR- species/sensitivities to follow - Sx's slightly better per pt.      LOS: 8 days A FACE TO FACE EVALUATION WAS PERFORMED  Kaeo Jacome 08/02/2020, 8:21 AM

## 2020-08-02 NOTE — Progress Notes (Signed)
PRN Miralax given d/t no BM x3 days. Bowel sounds active x4 quadrants. Patient passing flatus without difficulty. No result from Miralax yet.

## 2020-08-02 NOTE — Progress Notes (Signed)
Occupational Therapy Session Note  Patient Details  Name: Stacy Valencia MRN: [id number] Date of Birth: June 15, 1933  Today's Date: 08/02/2020 OT Individual Time: 1660-6301 OT Individual Time Calculation (min): 70 min    Short Term Goals: Week 2:  OT Short Term Goal 1 (Week 2): Pt will complete toilet transfers with Min A OT Short Term Goal 2 (Week 2): Pt will perform LB dressing with AE with Mod A OT Short Term Goal 3 (Week 2): Pt will peform sit <> stand consistently with Min A OT Short Term Goal 4 (Week 2): Pt will improve endurance to perform ADL for 10+ mins with no rest break  Skilled Therapeutic Interventions/Progress Updates:    Pt greeted at time of session supine in bed performing brief change with NT, stating "I cant, I'm done, I've done all I can do." With max encouragement, pt agreed to perform bed level exercises and declined any seated or standing activity, very adamant. Pt had no more clean clothes, all soiled and assisted with washing her clothing. Educated on importance of OOB activity and decreasing time in bed for recovery. Supine/HOB raised therex for 2x10 sets of chest press, overhead raises, lateral leans, and torso twists at bed level with rest breaks in between with 1kg red ball to improve strength and endurance. Pt needed to urinate multiple times throughout session, declining to use toilet and insisting on urinal d/t urgency, had pt perform hygiene indep with washcloth after each time. Discussed with husband Bob/collaborated regarding home set up at [facility]ME, he is looking into grab bars and TTB at [facility]. Pt educated on importance of OT/PT participation for optimal outcomes. Left in supine resting with alarm on, call bell in reach.   Therapy Documentation Precautions:  Precautions Precautions: Fall Restrictions Weight Bearing Restrictions: No LLE Weight Bearing: Weight bearing as tolerated     Therapy/Group: Individual Therapy  Viona Gilmore 08/02/2020, 4:17 PM

## 2020-08-02 NOTE — Progress Notes (Signed)
Occupational Therapy Session Note  Patient Details  Name: Stacy Valencia MRN: [id number] Date of Birth: 1933/09/10  Today's Date: 08/02/2020 OT Individual Time: 6160-7371 OT Individual Time Calculation (min): 54 min    Short Term Goals: Week 1:  OT Short Term Goal 1 (Week 1): Pt will complete sit<>stand with max A of 1 OT Short Term Goal 1 - Progress (Week 1): Met OT Short Term Goal 2 (Week 1): Pt will tolerate sitting EOB for 5 mins with no more than min A OT Short Term Goal 2 - Progress (Week 1): Met OT Short Term Goal 3 (Week 1): Pt will complete toilet transfer with max A of 1 OT Short Term Goal 3 - Progress (Week 1): Met  Skilled Therapeutic Interventions/Progress Updates:    1:1. Pt received in bed agreeable to OT. Pt resistant to OT but with encouragement agreeable to dressing at EOB. Pt completes supine>sitting with MOD A for LLE management as well as trunk elevation. Pt constantly groaning at EOB d/t L hip pain but provided rest as needed throughout tasks. Pt doffs socks with reacher with VC for AE technique. Pt attempts to thread LLE into pants with reacher2x, however the method for pulling up to knees threads LE into wrong pants leg. Therefore, when reaching RLE into pants first pt able to get second LE in with reacher. Pt dons B socks at EOB with sock aide and VC for method. Pt completes sit to stand with MOD A at EOB with VC for terminal hip extension while OT advances pants past hips with MIN A for standing balance and MIN A during pivot to w/c on L. Grooming completed seated at tabletop with pillow behind back for comfort. Exited session with pt seated in bed, exit alarm on and call light in reach  Therapy Documentation Precautions:  Precautions Precautions: Fall Restrictions Weight Bearing Restrictions: No LLE Weight Bearing: Weight bearing as tolerated General:   Vital Signs:   Pain: Pain Assessment Pain Scale: 0-10 Pain Score: 5  Pain Type: Surgical pain Pain  Location: Hip Pain Orientation: Left Pain Radiating Towards: Leg Pain Descriptors / Indicators: Aching Pain Frequency: Constant Pain Onset: On-going Patients Stated Pain Goal: 2 Pain Intervention(s): Elevated extremity;Emotional support;Environmental changes Multiple Pain Sites: No ADL: ADL Eating: Set up Grooming: Setup Upper Body Bathing: Minimal assistance Lower Body Bathing: Maximal assistance Upper Body Dressing: Moderate assistance Lower Body Dressing: Dependent Toileting: Maximal assistance Vision   Perception    Praxis   Exercises:   Other Treatments:     Therapy/Group: Individual Therapy  Tonny Branch 08/02/2020, 8:46 AM

## 2020-08-03 ENCOUNTER — Inpatient Hospital Stay (HOSPITAL_COMMUNITY): Payer: Medicare Other | Admitting: Occupational Therapy

## 2020-08-03 ENCOUNTER — Inpatient Hospital Stay (HOSPITAL_COMMUNITY): Payer: Medicare Other | Admitting: Physical Therapy

## 2020-08-03 ENCOUNTER — Inpatient Hospital Stay (HOSPITAL_COMMUNITY): Payer: Medicare Other

## 2020-08-03 MED ORDER — SODIUM CHLORIDE 0.9 % IV SOLN
1.0000 g | INTRAVENOUS | Status: DC
  Administered 2020-08-03 – 2020-08-05 (×3): 1 g via INTRAVENOUS
  Filled 2020-08-03 (×2): qty 10
  Filled 2020-08-03: qty 1

## 2020-08-03 MED ORDER — SORBITOL 70 % SOLN
30.0000 mL | Freq: Once | Status: DC
  Filled 2020-08-03 (×2): qty 30

## 2020-08-03 NOTE — Progress Notes (Signed)
[place] PHYSICAL MEDICINE & REHABILITATION PROGRESS NOTE   Subjective/Complaints:  Cellulitis is more red and feels itchy.   They changed ABX yesterday to Macrodantin, however cellulitis is worse, so need to make more broad spectrum to cover both UTI/cellulitis- per pharmacy, need IV coverage.    Voiding less- less dysuria.    ROS-   Pt denies SOB, abd pain, CP, N/V/C/D, and vision changes   Objective:   No results found. No results for input(s): WBC, HGB, HCT, PLT in the last 72 hours. No results for input(s): NA, K, CL, CO2, GLUCOSE, BUN, CREATININE, CALCIUM in the last 72 hours.  Intake/Output Summary (Last 24 hours) at 08/03/2020 0824 Last data filed at 08/03/2020 0725 Gross per 24 hour  Intake 580 ml  Output 350 ml  Net 230 ml     Physical Exam: Vital Signs Blood pressure (!) 113/55, pulse 76, temperature 97.8 F (36.6 C), resp. rate 18, height 5\' 6"  (1.676 m), weight 97.3 kg, SpO2 97 %. Marland Kitchenzts  General: No acute distress; sitting up in bed; appropriate, NAD Psych: Mood and affect are appropriate Heart: RRR Lungs: CTA B/L- no W/R/R- good air movement Abdomen: Soft, NT, ND, (+)BS  Extremities: No clubbing, cyanosis, or edema Skin: 1/2 staples out- draining a little- now much more deep pink, almost red- much more evident cellulitis Neurologic: Cranial nerves II through XII intact, motor strength is 5/5 in bilateral deltoid, bicep, tricep, grip, Right hip flexor, knee extensors, ankle dorsiflexor and plantar flexor  Left lower extremity: Hip flexion 2/5 (pain inhibition), ankle dorsiflexion 4+/5. Left hip with edema and tenderness    Assessment/Plan: 1. Functional deficits secondary to left hip fracture which require 3+ hours per day of interdisciplinary therapy in a comprehensive inpatient rehab setting.  Physiatrist is providing close team supervision and 24 hour management of active medical problems listed below.  Physiatrist and rehab team continue to  assess barriers to discharge/monitor patient progress toward functional and medical goals  Care Tool:  Bathing    Body parts bathed by patient: Right arm, Left arm, Chest, Abdomen, Right upper leg, Left upper leg, Face   Body parts bathed by helper: Left lower leg, Right lower leg, Buttocks     Bathing assist Assist Level: Maximal Assistance - Patient 24 - 49%     Upper Body Dressing/Undressing Upper body dressing   What is the patient wearing?: Pull over shirt    Upper body assist Assist Level: Supervision/Verbal cueing    Lower Body Dressing/Undressing Lower body dressing      What is the patient wearing?: Pants     Lower body assist Assist for lower body dressing: Total Assistance - Patient < 25%     Toileting Toileting    Toileting assist Assist for toileting: Total Assistance - Patient < 25%     Transfers Chair/bed transfer  Transfers assist     Chair/bed transfer assist level: Minimal Assistance - Patient > 75% Chair/bed transfer assistive device: Programmer, multimedia   Ambulation assist      Assist level: 2 helpers Assistive device: Walker-rolling Max distance: 55'   Walk 10 feet activity   Assist  Walk 10 feet activity did not occur: Safety/medical concerns  Assist level: 2 helpers Assistive device: Walker-rolling   Walk 50 feet activity   Assist Walk 50 feet with 2 turns activity did not occur: Safety/medical concerns         Walk 150 feet activity   Assist Walk 150 feet activity  did not occur: Safety/medical concerns         Walk 10 feet on uneven surface  activity   Assist Walk 10 feet on uneven surfaces activity did not occur: Safety/medical concerns         Wheelchair     Assist Will patient use wheelchair at discharge?: Yes (Per PT long-term goals) Type of Wheelchair: Manual    Wheelchair assist level: Supervision/Verbal cueing Max wheelchair distance: 50    Wheelchair 50 feet with 2  turns activity    Assist        Assist Level: Supervision/Verbal cueing   Wheelchair 150 feet activity     Assist      Assist Level: Moderate Assistance - Patient 50 - 74%   Blood pressure (!) 113/55, pulse 76, temperature 97.8 F (36.6 C), resp. rate 18, height 5\' 6"  (1.676 m), weight 97.3 kg, SpO2 97 %.  Medical Problem List and Plan: 1.  Deficits with mobility, transfers, self-care secondary to left hip fracture status post IM nail.  8/10- surgery was 8/2- needs staples out 8/16.              8/11- staple area look a little cellulitic-adding Keflex for UTI- will also cover skin bacteria- will monitor   8/12- will remove every OTHER staple- placed order and let nursing know verbally             -ELOS/Goals: 15-19 days/min A             Continue CIR 2. H/o PE/ Antithrombotics: -DVT/anticoagulation:  Pharmaceutical: Xarelto             -antiplatelet therapy: N/A 3. Pain Management: Hydrocodone as needed--uncontrolled increase norco to 7.5mg    q 6h 4. Mood: LCSW to follow for evaluation and support  8/10- still having pain issues- said tylenol does better than Norco- will con't tylenol, tramadol, and Norco 1-2 tabs q6 hours prn   8/12- fewer pain complaints today- missed some therapy yesterday due to nap             -antipsychotic agents: N/A 5. Neuropsych: This patient is capable of making decisions on her own behalf. 6. Skin/Wound Care: Monitor wound for healing.  Added protein supplements to promote healing 7. Fluids/Electrolytes/Nutrition: Monitor I's/O.  Encourage fluid intake.  CMP ordered for tomorrow a.m.  8/11- Cr 0.94- con't to monitor 8. GERD: Continue Protonix with Carafate as needed indigestion  8/9- no complaints- con't regimen 9. ABLA: Added iron supplement.  Hgb 7.4 on 8/5, repeat Monday.   8/9- Hb 8.7-  10.  Tachycardia: Continue to monitor heart rate/BP 3 times daily.  On Lopressor 12.5 mg twice daily. Well controlled  8/9- BP well controlled- pulse  70s- con't meds             Monitor with increased activity. 11.  Acute renal injury: Encourage fluid intake.               Creatinine much improved to 1.01.   8/9- Cr 0.94  8/13- per pharmacy, renal fxn slightly decreased due to age 20.  Supplemental oxygen dependent             Wean as tolerated 13. Dysuria/UTI  8/10- will check U/A and Cx due to dysuria, frequency and urgency.  8/11- Has large leuks and (+) nitrites and WBC >50- will start Keflex 500 mg BID- is allergic to sulfa.  8/12- >100k GNR- species/sensitivities to follow - Sx's slightly better per pt.  8/13- Citrobacter- UTI- due to combined UTI and cellulitis, change to Ceftriaxone 1 g q24 hours.  14. Cellulitis  8/12- change to Rocephin 1 g q24 hours to cover both UTI and cellulitis.     LOS: 9 days A FACE TO FACE EVALUATION WAS PERFORMED  Stacy Valencia 08/03/2020, 8:24 AM

## 2020-08-03 NOTE — Progress Notes (Signed)
Physical Therapy Session Note  Patient Details  Name: Stacy Valencia MRN: [id number] Date of Birth: 27-Sep-1933  Today's Date: 08/03/2020 PT Individual Time: 1420-1529 PT Individual Time Calculation (min): 69 min   Short Term Goals: Week 2:  PT Short Term Goal 1 (Week 2): Pt will ambulate 19' with CGA PT Short Term Goal 2 (Week 2): Pt will complete 4 steps with BHRs and minA PT Short Term Goal 3 (Week 2): Pt will perform bed to chair transfer with CGA  Skilled Therapeutic Interventions/Progress Updates:     Pt received supine in bed and requires encouragement to participate in therapy, requesting repeatedly to rest and remain in bed. PT provides education on risks of prolonged bedrest and benefits of mobility and eventually pt is agreeable. Pt reports severe pain in L hip and L knee, as well as low back. PT provides repositioning, mobility, and ice following session to manage pain symptoms.  Supine to sit with minA and use of bed rails and HOB raised. Stand pivot transfer to [facility] with minA. PT provides maxA to thread and pull up pants. WC transport to gym for time management. SPT to Nustep with minA and RW. Pt performs Nustep for total of x12 minutes with multiple extended rest breaks. Performed for endurance training and AAROM of LLE for improved mobility and pain management.   Pt ambulates 74' with minA +1 and WC follow +2. Pt requires max encouragement for ambulation, attempting to refuse to perform due to fatigue and pain.   Pt left seated in WC with alarm intact and all needs within reach. Ice placed on L knee with education on 20 on 20 off for pain relief and inflammation management.  Therapy Documentation Precautions:  Precautions Precautions: Fall Restrictions Weight Bearing Restrictions: No LLE Weight Bearing: Weight bearing as tolerated    Therapy/Group: Individual Therapy  Breck Coons, PT, DPT 08/03/2020, 5:50 PM

## 2020-08-03 NOTE — Progress Notes (Signed)
Physical Therapy Session Note  Patient Details  Name: Stacy Valencia MRN: [id number] Date of Birth: [birth year]  Today's Date: 08/03/2020 PT Individual Time: 6606-0045 PT Individual Time Calculation (min): 40 min   Short Term Goals: Week 2:  PT Short Term Goal 1 (Week 2): Pt will ambulate 67' with CGA PT Short Term Goal 2 (Week 2): Pt will complete 4 steps with BHRs and minA PT Short Term Goal 3 (Week 2): Pt will perform bed to chair transfer with CGA  Skilled Therapeutic Interventions/Progress Updates:  Pt received in bed with husband present in room. Pt c/o fatigue as she only sleeps in 3-4 hr increments at night.  Pt only willing to ambulate at this time if she doesn't have to during next PT session so pt performed LLE strengthening exercises at bed level. PT provided supine HEP & pt performed the following: ankle pumps, short arc quads, heel slides, hip abduction, and quad/glute sets with AAROM, PT educating pt on breathing with exertion, and PT educating her on technique. Pt requires rest breaks between each exercise. Reviewed use of incentive spirometer with pt return demonstrating. Pt declined transferring to sitting EOB. Pt left in bed with alarm set, call bell in reach, husband in room.  Therapy Documentation Precautions:  Precautions Precautions: Fall Restrictions Weight Bearing Restrictions: No LLE Weight Bearing: Weight bearing as tolerated   General: PT Amount of Missed Time (min): 20 Minutes PT Missed Treatment Reason: Patient fatigue;Patient unwilling to participate  Pain: 3-4/10 at rest, pt moans with movement - rest breaks provided PRN & nurse administered meds at beginning of session   Therapy/Group: Individual Therapy  Waunita Schooner 08/03/2020, 12:17 PM

## 2020-08-03 NOTE — Progress Notes (Signed)
Occupational Therapy Session Note  Patient Details  Name: Stacy Valencia MRN: [id number] Date of Birth: May 27, 1933  Today's Date: 08/03/2020 OT Individual Time: 8338-2505 OT Individual Time Calculation (min): 56 min   Short Term Goals: Week 2:  OT Short Term Goal 1 (Week 2): Pt will complete toilet transfers with Min A OT Short Term Goal 2 (Week 2): Pt will perform LB dressing with AE with Mod A OT Short Term Goal 3 (Week 2): Pt will peform sit <> stand consistently with Min A OT Short Term Goal 4 (Week 2): Pt will improve endurance to perform ADL for 10+ mins with no rest break  Skilled Therapeutic Interventions/Progress Updates:    Pt greeted while on the bedpan, reporting that she was nearly finished voiding, premedicated for pain but still in a lot of pain during tx. Min-Mod A for bed mobility going Lt>Rt respectively for removal of bedpan, hygiene, and brief change. Pt able to assist with pericare in the front. She doffed her shirt in bed with increased time and then washed UB with min cuing for thoroughness. Overhead shirt donned with Mod A with pt rolling Rt>Lt to pull shirt down over trunk. Total A for donning pants bedlevel with pt rolling with the same assistance as stated above. She was able to help with pulling pants up over hips a bit, limited due to pain. Min A for washing hands with sanitizer and applying lotion, pt unable to problem solve how to dispense sanitizer and open lotion given increased time or vcs. Throughout functional bed mobility today, OT educated pt on mindful visualization techniques to holistically manage her pain, also practiced deep belly breathing with hands on stomach when pt requested rest breaks. Discussed concept of PNS/SNS activity in regards to pain perception using plain language, emphasizing that when she is calm/relaxed pain perception can be decreased. OT placed a lavender scented cotton ball in her pillowcase to promote relaxation/calmness before next  therapy to maximize her participation. Pt declined all OOB mobility today due to pain, though was provided gentle encouragement. Left her with all needs within reach and bed alarm set. Tx focus placed on holistic pain mgt and adaptive self care skills to manage pain, also on activity tolerance.   Therapy Documentation Precautions:  Precautions Precautions: Fall Restrictions Weight Bearing Restrictions: No LLE Weight Bearing: Weight bearing as tolerated Pain: Pain Assessment Pain Scale: 0-10 Pain Score: 4  ADL: ADL Eating: Set up Grooming: Setup Upper Body Bathing: Minimal assistance Lower Body Bathing: Maximal assistance Upper Body Dressing: Moderate assistance Lower Body Dressing: Dependent Toileting: Maximal assistance     Therapy/Group: Individual Therapy  Ayaat Jansma A Nkosi Cortright 08/03/2020, 12:19 PM

## 2020-08-04 NOTE — Progress Notes (Signed)
[place] PHYSICAL MEDICINE & REHABILITATION PROGRESS NOTE   Subjective/Complaints: Pain is currently 3/10 in left hip.  Voided this AM Had BM last night.  No complaints  ROS-   Pt denies SOB, abd pain, CP, N/V/C/D, and vision changes   Objective:   No results found. No results for input(s): WBC, HGB, HCT, PLT in the last 72 hours. No results for input(s): NA, K, CL, CO2, GLUCOSE, BUN, CREATININE, CALCIUM in the last 72 hours.  Intake/Output Summary (Last 24 hours) at 08/04/2020 1109 Last data filed at 08/04/2020 0900 Gross per 24 hour  Intake 480 ml  Output 250 ml  Net 230 ml     Physical Exam: Vital Signs Blood pressure (!) 116/58, pulse 92, temperature (!) 97.4 F (36.3 C), resp. rate 17, height 5\' 6"  (1.676 m), weight 97.3 kg, SpO2 96 %.  General: Alert and oriented x 3, No apparent distress HEENT: Head is normocephalic, atraumatic, PERRLA, EOMI, sclera anicteric, oral mucosa pink and moist, dentition intact, ext ear canals clear,  Neck: Supple without JVD or lymphadenopathy Heart: Reg rate and rhythm. No murmurs rubs or gallops Chest: CTA bilaterally without wheezes, rales, or rhonchi; no distress Abdomen: Soft, non-tender, non-distended, bowel sounds positive. Extremities: No clubbing, cyanosis, or edema. Pulses are 2+ Skin: 1/2 staples out- draining a little- now much more deep pink, almost red- much more evident cellulitis Neurologic: Cranial nerves II through XII intact, motor strength is 5/5 in bilateral deltoid, bicep, tricep, grip, Right hip flexor, knee extensors, ankle dorsiflexor and plantar flexor  Left lower extremity: Hip flexion 2/5 (pain inhibition), ankle dorsiflexion 4+/5. Left hip with edema and tenderness    Assessment/Plan: 1. Functional deficits secondary to left hip fracture which require 3+ hours per day of interdisciplinary therapy in a comprehensive inpatient rehab setting.  Physiatrist is providing close team supervision and 24 hour  management of active medical problems listed below.  Physiatrist and rehab team continue to assess barriers to discharge/monitor patient progress toward functional and medical goals  Care Tool:  Bathing    Body parts bathed by patient: Right arm, Left arm, Chest, Abdomen, Right upper leg, Left upper leg, Face   Body parts bathed by helper: Left lower leg, Right lower leg, Buttocks     Bathing assist Assist Level: Maximal Assistance - Patient 24 - 49%     Upper Body Dressing/Undressing Upper body dressing   What is the patient wearing?: Pull over shirt    Upper body assist Assist Level: Moderate Assistance - Patient 50 - 74%    Lower Body Dressing/Undressing Lower body dressing      What is the patient wearing?: Pants, Incontinence brief     Lower body assist Assist for lower body dressing: Total Assistance - Patient < 25%     Toileting Toileting    Toileting assist Assist for toileting: Total Assistance - Patient < 25%     Transfers Chair/bed transfer  Transfers assist     Chair/bed transfer assist level: Minimal Assistance - Patient > 75% Chair/bed transfer assistive device: Programmer, multimedia   Ambulation assist      Assist level: 2 helpers Assistive device: Walker-rolling Max distance: 55'   Walk 10 feet activity   Assist  Walk 10 feet activity did not occur: Safety/medical concerns  Assist level: 2 helpers Assistive device: Walker-rolling   Walk 50 feet activity   Assist Walk 50 feet with 2 turns activity did not occur: Safety/medical concerns  Walk 150 feet activity   Assist Walk 150 feet activity did not occur: Safety/medical concerns         Walk 10 feet on uneven surface  activity   Assist Walk 10 feet on uneven surfaces activity did not occur: Safety/medical concerns         Wheelchair     Assist Will patient use wheelchair at discharge?: Yes (Per PT long-term goals) Type of Wheelchair:  Manual    Wheelchair assist level: Supervision/Verbal cueing Max wheelchair distance: 50    Wheelchair 50 feet with 2 turns activity    Assist        Assist Level: Supervision/Verbal cueing   Wheelchair 150 feet activity     Assist      Assist Level: Moderate Assistance - Patient 50 - 74%   Blood pressure (!) 116/58, pulse 92, temperature (!) 97.4 F (36.3 C), resp. rate 17, height 5\' 6"  (1.676 m), weight 97.3 kg, SpO2 96 %.  Medical Problem List and Plan: 1.  Deficits with mobility, transfers, self-care secondary to left hip fracture status post IM nail.  8/10- surgery was 8/2- needs staples out 8/16.              8/11- staple area look a little cellulitic-adding Keflex for UTI- will also cover skin bacteria- will monitor   8/12- will remove every OTHER staple- placed order and let nursing know verbally             -ELOS/Goals: 15-19 days/min A             Continue CIR 2. H/o PE/ Antithrombotics: -DVT/anticoagulation:  Pharmaceutical: Xarelto             -antiplatelet therapy: N/A 3. Pain Management: Hydrocodone as needed--uncontrolled increase norco to 7.5mg    q 6h 4. Mood: LCSW to follow for evaluation and support  8/10- still having pain issues- said tylenol does better than Norco- will con't tylenol, tramadol, and Norco 1-2 tabs q6 hours prn   8/12- fewer pain complaints today- missed some therapy yesterday due to nap  8/14: well controlled             -antipsychotic agents: N/A 5. Neuropsych: This patient is capable of making decisions on her own behalf. 6. Skin/Wound Care: Monitor wound for healing.  Added protein supplements to promote healing. Healing well.  7. Fluids/Electrolytes/Nutrition: Monitor I's/O.  Encourage fluid intake.  CMP ordered for tomorrow a.m.  8/11- Cr 0.94- con't to monitor 8. GERD: Continue Protonix with Carafate as needed indigestion  8/9- no complaints- con't regimen 9. ABLA: Added iron supplement.  Hgb 7.4 on 8/5, repeat Monday.    8/9- Hb 8.7-  10.  Tachycardia: Continue to monitor heart rate/BP 3 times daily.  On Lopressor 12.5 mg twice daily. Well controlled  8/9- BP well controlled- pulse 70s- con't meds             Monitor with increased activity. 11.  Acute renal injury: Encourage fluid intake.               Creatinine much improved to 1.01.   8/9- Cr 0.94  8/13- per pharmacy, renal fxn slightly decreased due to age 89.  Supplemental oxygen dependent             Wean as tolerated 13. Dysuria/UTI  8/10- will check U/A and Cx due to dysuria, frequency and urgency.  8/11- Has large leuks and (+) nitrites and WBC >50- will start Keflex 500 mg BID-  is allergic to sulfa.  8/12- >100k GNR- species/sensitivities to follow - Sx's slightly better per pt.    8/13- Citrobacter- UTI- due to combined UTI and cellulitis, change to Ceftriaxone 1 g q24 hours. 14. Cellulitis  8/12- change to Rocephin 1 g q24 hours to cover both UTI and cellulitis.     LOS: 10 days A FACE TO FACE EVALUATION WAS PERFORMED  Kyi Romanello P Deniz Hannan 08/04/2020, 11:09 AM

## 2020-08-04 NOTE — Plan of Care (Addendum)
  Problem: RH BLADDER ELIMINATION Goal: RH STG MANAGE BLADDER WITH ASSISTANCE Description: STG Manage Bladder With Moderate Assistance Outcome: Not Progressing; incontinence at times   Problem: RH SKIN INTEGRITY Goal: RH STG SKIN FREE OF INFECTION/BREAKDOWN Outcome: Not Progressing; redness noted on pt's left hip incision; staples intact; patient on Rocephin; MD made aware

## 2020-08-05 ENCOUNTER — Inpatient Hospital Stay (HOSPITAL_COMMUNITY): Payer: Medicare Other

## 2020-08-05 MED ORDER — HYDROCODONE-ACETAMINOPHEN 7.5-325 MG PO TABS
1.0000 | ORAL_TABLET | ORAL | Status: DC | PRN
  Administered 2020-08-05 – 2020-08-10 (×18): 2 via ORAL
  Filled 2020-08-05 (×18): qty 2

## 2020-08-05 MED ORDER — FERROUS SULFATE 325 (65 FE) MG PO TABS
325.0000 mg | ORAL_TABLET | Freq: Every day | ORAL | Status: DC
  Administered 2020-08-06 – 2020-08-09 (×4): 325 mg via ORAL
  Filled 2020-08-05 (×4): qty 1

## 2020-08-05 MED ORDER — DOXYCYCLINE HYCLATE 100 MG PO TABS
100.0000 mg | ORAL_TABLET | Freq: Two times a day (BID) | ORAL | Status: DC
  Administered 2020-08-05 – 2020-08-10 (×11): 100 mg via ORAL
  Filled 2020-08-05 (×11): qty 1

## 2020-08-05 NOTE — Progress Notes (Signed)
Occupational Therapy Session Note  Patient Details  Name: Stacy Valencia MRN: [id number] Date of Birth: 03/08/1933  Today's Date: 08/05/2020 OT Individual Time: 1400-1445 OT Individual Time Calculation (min): 45 min    Short Term Goals: Week 1:  OT Short Term Goal 1 (Week 1): Pt will complete sit<>stand with max A of 1 OT Short Term Goal 1 - Progress (Week 1): Met OT Short Term Goal 2 (Week 1): Pt will tolerate sitting EOB for 5 mins with no more than min A OT Short Term Goal 2 - Progress (Week 1): Met OT Short Term Goal 3 (Week 1): Pt will complete toilet transfer with max A of 1 OT Short Term Goal 3 - Progress (Week 1): Met  Skilled Therapeutic Interventions/Progress Updates:    1:1. Pt received in bed. Agreeable to OT with pain in back and hip. RN already delivered pain medicaiton and pillow put behind back for support. Pt completes supine>sitting with A only to manage LLE. Pt completes stand pivot transfer to/from EOB with MIN A for power up and VC for pushing up with LUE d/t new IV being placed on RUE. Pt completes standing connect 4 game with MIN A for power up and CGA for standing blanace and max encouragement to participate. Pt completes 2x standing 36mn per trial. Returned to bed,exit alarm on and call light in reach  Therapy Documentation Precautions:  Precautions Precautions: Fall Restrictions Weight Bearing Restrictions: No LLE Weight Bearing: Weight bearing as tolerated General:   Vital Signs: Therapy Vitals Temp: 97.6 F (36.4 C) Pulse Rate: 92 Resp: 16 BP: (!) 110/52 Patient Position (if appropriate): Lying Oxygen Therapy SpO2: 96 % O2 Device: Room Air Pain: Pain Assessment Pain Scale: 0-10 Pain Score: 0-No pain Pain Type: Surgical pain Pain Location: Incision Pain Descriptors / Indicators: Aching Pain Frequency: Constant Pain Onset: On-going Pain Intervention(s): Medication (See eMAR) ADL: ADL Eating: Set up Grooming: Setup Upper Body Bathing:  Minimal assistance Lower Body Bathing: Maximal assistance Upper Body Dressing: Moderate assistance Lower Body Dressing: Dependent Toileting: Maximal assistance Vision   Perception    Praxis   Exercises:   Other Treatments:     Therapy/Group: Individual Therapy  STonny Branch8/15/2021, 2:01 PM

## 2020-08-05 NOTE — Progress Notes (Signed)
Subjective:     Patient reports pain as mild to moderate.  Received call from Dr. Ranell Patrick about potential cellulitis at L hip incision sites.  Patient underwent IM nailing L femur fx by Dr. Lyla Glassing on 8/2.  Patient denies fever, chills, N/V, CP, SOB.  States that the incision sites are a little itchy, but do not hurt.  Patient was placed on rocephin on 8/12 for suspected cellulitis and UTI.    Objective:   VITALS:  Temp:  [97.6 F (36.4 C)-98.3 F (36.8 C)] 97.6 F (36.4 C) (08/15 0445) Pulse Rate:  [75-93] 75 (08/15 0445) Resp:  [19-22] 22 (08/15 0445) BP: (111-127)/(54-60) 111/54 (08/15 0445) SpO2:  [94 %-97 %] 94 % (08/15 0445)  General: WDWN patient in NAD. Psych:  Appropriate mood and affect. Neuro:  A&O x 3, Moving all extremities, sensation intact to light touch HEENT:  EOMs intact Chest:  Even non-labored respirations Skin:  Staples are intact.  Scabbing around the incision sites are note.   Extremities: warm/dry, mild edema and diffuse erythema around incision sites of L hip.  There's no purulence, drainage, or malodorous aroma. Pulses: Popliteus 2+ MSK:  ROM: TKE, MMT: able to perform quad set, (-) Homan's    LABS No results for input(s): HGB, WBC, PLT in the last 72 hours. No results for input(s): NA, K, CL, CO2, BUN, CREATININE, GLUCOSE in the last 72 hours. No results for input(s): LABPT, INR in the last 72 hours.   Assessment/Plan:    Cellulitis of L hip incision sites S/P L femoral IM nailing  I sent pics of the incision sites to Dr. Lyla Glassing.  Staples were removed and steri-strips were applied.  Per Dr. Sid Falcon recommendation I have placed an order for 14 days of Doxy 100mg  BID.    Continue working with therapy.  Plan for outpatient post-op visit with Dr. Lyla Glassing.  Mechele Claude PA-C [facility] Office:  [phone]

## 2020-08-05 NOTE — Progress Notes (Signed)
Occupational Therapy Session Note  Patient Details  Name: Stacy Valencia MRN: [id number] Date of Birth: [birth year]  Today's Date: 08/05/2020 OT Individual Time: 8592-7639 OT Individual Time Calculation (min): 55 min    Short Term Goals: Week 1:  OT Short Term Goal 1 (Week 1): Pt will complete sit<>stand with max A of 1 OT Short Term Goal 1 - Progress (Week 1): Met OT Short Term Goal 2 (Week 1): Pt will tolerate sitting EOB for 5 mins with no more than min A OT Short Term Goal 2 - Progress (Week 1): Met OT Short Term Goal 3 (Week 1): Pt will complete toilet transfer with max A of 1 OT Short Term Goal 3 - Progress (Week 1): Met  Skilled Therapeutic Interventions/Progress Updates:    1:1. Pt received in bed requring max encouragement to participate in tx. Pt declines bathing but agreeable to dressing EOB. Pt completes bed mobility with HOB elevated and rails and A to manage LLE. Pt completes doffing shirt and donning gown with set up. Pt requires VC for AE training for reacher and sock aide to doff and don socks despite 3rd time attempting. MD in room and verbal order to don teds for edema management. Pt completes stand pivot transfer to w/c with RW with pt gabbing onto OT arm with stern direct cues for holding onto RW, managing RW and stepping/turning. Exited session with pt seated in w/c, exit alarm on, call bell in reach and RN bringing pt tylenol for pain.   Therapy Documentation Precautions:  Precautions Precautions: Fall Restrictions Weight Bearing Restrictions: No LLE Weight Bearing: Weight bearing as tolerated General:   Vital Signs:  Pain: Pain Assessment Pain Scale: 0-10 Pain Score: 8  Pain Type: Surgical pain Pain Location: Incision Pain Orientation: Left Pain Radiating Towards: leg Pain Descriptors / Indicators: Aching Pain Frequency: Constant Pain Onset: On-going Pain Intervention(s): Medication (See eMAR) ADL: ADL Eating: Set up Grooming: Setup Upper Body  Bathing: Minimal assistance Lower Body Bathing: Maximal assistance Upper Body Dressing: Moderate assistance Lower Body Dressing: Dependent Toileting: Maximal assistance Vision   Perception    Praxis   Exercises:   Other Treatments:     Therapy/Group: Individual Therapy  Tonny Branch 08/05/2020, 9:43 AM

## 2020-08-05 NOTE — Progress Notes (Signed)
Stacy Valencia   Subjective/Complaints: Patient has erythema surrounding left hip incision site staples. Increasing pain today while sitting at EOB with therapy. On IV antibiotics for cellulitis. Called Dr. Sid Falcon office to make them aware and patient will be seen by physician or PA today or tomorrow.   ROS-   Pt denies SOB, abd pain, CP, N/V/C/D, and vision changes   Objective:   No results found. No results for input(s): WBC, HGB, HCT, PLT in the last 72 hours. No results for input(s): NA, K, CL, CO2, GLUCOSE, BUN, CREATININE, CALCIUM in the last 72 hours.  Intake/Output Summary (Last 24 hours) at 08/05/2020 0926 Last data filed at 08/04/2020 2126 Gross per 24 hour  Intake 360 ml  Output 50 ml  Net 310 ml     Physical Exam: Vital Signs Blood pressure (!) 111/54, pulse 75, temperature 97.6 F (36.4 C), temperature source Oral, resp. rate (!) 22, height 5\' 6"  (1.676 m), weight 97.3 kg, SpO2 94 %.   General: Alert and oriented x 3, No apparent distress HEENT: Head is normocephalic, atraumatic, PERRLA, EOMI, sclera anicteric, oral mucosa pink and moist, dentition intact, ext ear canals clear,  Neck: Supple without JVD or lymphadenopathy Heart: Reg rate and rhythm. No murmurs rubs or gallops Chest: CTA bilaterally without wheezes, rales, or rhonchi; no distress Abdomen: Soft, non-tender, non-distended, bowel sounds positive. Extremities: 1+ edema in left leg Skin: Increasing erythema around left hip staples. Tender to palpation. No purulence Neuro: Pt is cognitively appropriate with normal insight, memory, and awareness. Cranial nerves 2-12 are intact. Left lower extremity: Hip flexion 2/5 (pain inhibition), ankle dorsiflexion 4+/5. Left hip with edema and tenderness  Psych: Pt's affect is appropriate. Pt is cooperative  Assessment/Plan: 1. Functional deficits secondary to left hip fracture which require 3+ hours per day of  interdisciplinary therapy in a comprehensive inpatient rehab setting.  Physiatrist is providing close team supervision and 24 hour management of active medical problems listed below.  Physiatrist and rehab team continue to assess barriers to discharge/monitor patient progress toward functional and medical goals  Care Tool:  Bathing    Body parts bathed by patient: Right arm, Left arm, Chest, Abdomen, Right upper leg, Left upper leg, Face   Body parts bathed by helper: Left lower leg, Right lower leg, Buttocks     Bathing assist Assist Level: Maximal Assistance - Patient 24 - 49%     Upper Body Dressing/Undressing Upper body dressing   What is the patient wearing?: Pull over shirt    Upper body assist Assist Level: Moderate Assistance - Patient 50 - 74%    Lower Body Dressing/Undressing Lower body dressing      What is the patient wearing?: Pants, Incontinence brief     Lower body assist Assist for lower body dressing: Total Assistance - Patient < 25%     Toileting Toileting    Toileting assist Assist for toileting: Total Assistance - Patient < 25%     Transfers Chair/bed transfer  Transfers assist     Chair/bed transfer assist level: Minimal Assistance - Patient > 75% Chair/bed transfer assistive device: Programmer, multimedia   Ambulation assist      Assist level: 2 helpers Assistive device: Walker-rolling Max distance: 55'   Walk 10 feet activity   Assist  Walk 10 feet activity did not occur: Safety/medical concerns  Assist level: 2 helpers Assistive device: Walker-rolling   Walk 50 feet activity   Assist Walk  50 feet with 2 turns activity did not occur: Safety/medical concerns         Walk 150 feet activity   Assist Walk 150 feet activity did not occur: Safety/medical concerns         Walk 10 feet on uneven surface  activity   Assist Walk 10 feet on uneven surfaces activity did not occur: Safety/medical  concerns         Wheelchair     Assist Will patient use wheelchair at discharge?: Yes (Per PT long-term goals) Type of Wheelchair: Manual    Wheelchair assist level: Supervision/Verbal cueing Max wheelchair distance: 50    Wheelchair 50 feet with 2 turns activity    Assist        Assist Level: Supervision/Verbal cueing   Wheelchair 150 feet activity     Assist      Assist Level: Moderate Assistance - Patient 50 - 74%   Blood pressure (!) 111/54, pulse 75, temperature 97.6 F (36.4 C), temperature source Oral, resp. rate (!) 22, height 5\' 6"  (1.676 m), weight 97.3 kg, SpO2 94 %.  Medical Problem List and Plan: 1.  Deficits with mobility, transfers, self-care secondary to left hip fracture status post IM nail.  8/10- surgery was 8/2- needs staples out 8/16.              8/11- staple area look a little cellulitic-adding Keflex for UTI- will also cover skin bacteria- will monitor   8/12- will remove every OTHER staple- placed order and let nursing know verbally             -ELOS/Goals: 15-19 days/min A             Continue CIR 2. H/o PE/ Antithrombotics: -DVT/anticoagulation:  Pharmaceutical: Xarelto             -antiplatelet therapy: N/A 3. Pain Management: Hydrocodone as needed--uncontrolled increase norco to 7.5mg    q 6h 4. Mood: LCSW to follow for evaluation and support  8/10- still having pain issues- said tylenol does better than Norco- will con't tylenol, tramadol, and Norco 1-2 tabs q6 hours prn   8/12- fewer pain complaints today- missed some therapy yesterday due to nap  8/15: Pain worse today with just sitting at EOB. Increased Norco frequency to q4H.             -antipsychotic agents: N/A 5. Neuropsych: This patient is capable of making decisions on her own behalf. 6. Skin/Wound Care: Monitor wound for healing.  Added protein supplements to promote healing. Increased erythema surrounding left hip staples- contacted Dr. Sid Falcon office and  physician or PA to see patient tomorrow. Continue IV antibiotics.  7. Fluids/Electrolytes/Nutrition: Monitor I's/O.  Encourage fluid intake.  CMP ordered for tomorrow a.m.  8/11- Cr 0.94- con't to monitor 8. GERD: Continue Protonix with Carafate as needed indigestion  8/9- no complaints- con't regimen 9. ABLA: Added iron supplement.  Hgb 7.4 on 8/5, repeat Monday.   8/9- Hb 8.7-  10.  Tachycardia: Continue to monitor heart rate/BP 3 times daily.  On Lopressor 12.5 mg twice daily. Well controlled             Monitor with increased activity. 11.  Acute renal injury: Encourage fluid intake.               Creatinine much improved to 1.01.   8/9- Cr 0.94  8/13- per pharmacy, renal fxn slightly decreased due to age 86.  Supplemental oxygen dependent  Wean as tolerated 13. Dysuria/UTI  8/10- will check U/A and Cx due to dysuria, frequency and urgency.  8/11- Has large leuks and (+) nitrites and WBC >50- will start Keflex 500 mg BID- is allergic to sulfa.  8/12- >100k GNR- species/sensitivities to follow - Sx's slightly better per pt.    8/13- Citrobacter- UTI- due to combined UTI and cellulitis, change to Ceftriaxone 1 g q24 hours. 14. Cellulitis  8/12- change to Rocephin 1 g q24 hours to cover both UTI and cellulitis.    >35 minutes spent in examination of patient; discussion with patient regarding her pain, lower extremity swelling, and erythema at incision site; discussion with OT regarding lower extremity swelling and erythema at incision site; ordering compression garment, reviewing medications and increasing Norco frequency; discussion with ortho regarding increasing erythema and pain at incision site.  LOS: 11 days A FACE TO FACE EVALUATION WAS PERFORMED  Stacy Valencia 08/05/2020, 9:26 AM

## 2020-08-06 ENCOUNTER — Inpatient Hospital Stay (HOSPITAL_COMMUNITY): Payer: Medicare Other | Admitting: Occupational Therapy

## 2020-08-06 ENCOUNTER — Encounter (HOSPITAL_COMMUNITY): Payer: Medicare Other | Admitting: Psychology

## 2020-08-06 ENCOUNTER — Inpatient Hospital Stay (HOSPITAL_COMMUNITY): Payer: Medicare Other

## 2020-08-06 DIAGNOSIS — F4322 Adjustment disorder with anxiety: Secondary | ICD-10-CM

## 2020-08-06 DIAGNOSIS — S72002S Fracture of unspecified part of neck of left femur, sequela: Secondary | ICD-10-CM

## 2020-08-06 LAB — BASIC METABOLIC PANEL
Anion gap: 7 (ref 5–15)
BUN: 9 mg/dL (ref 8–23)
CO2: 23 mmol/L (ref 22–32)
Calcium: 8.7 mg/dL — ABNORMAL LOW (ref 8.9–10.3)
Chloride: 104 mmol/L (ref 98–111)
Creatinine, Ser: 0.88 mg/dL (ref 0.44–1.00)
GFR calc Af Amer: >60 mL/min (ref >60)
GFR calc non Af Amer: 60 mL/min — ABNORMAL LOW (ref >60)
Glucose, Bld: 101 mg/dL — ABNORMAL HIGH (ref 70–99)
Potassium: 3.9 mmol/L (ref 3.5–5.1)
Sodium: 134 mmol/L — ABNORMAL LOW (ref 135–145)

## 2020-08-06 NOTE — Progress Notes (Signed)
[facility] PHYSICAL MEDICINE & REHABILITATION PROGRESS NOTE   Subjective/Complaints:  Pt still c/o redness around incision- hip incision.  Got all staples- didn't sleep well- 3/10 pain- much better.   LBM yesterday- passed  gas today.     ROS-   Pt denies SOB, abd pain, CP, N/V/C/D, and vision changes   Objective:   No results found. No results for input(s): WBC, HGB, HCT, PLT in the last 72 hours. Recent Labs    08/06/20 0450  NA 134*  K 3.9  CL 104  CO2 23  GLUCOSE 101*  BUN 9  CREATININE 0.88  CALCIUM 8.7*    Intake/Output Summary (Last 24 hours) at 08/06/2020 0827 Last data filed at 08/06/2020 0115 Gross per 24 hour  Intake 320 ml  Output 650 ml  Net -330 ml     Physical Exam: Vital Signs Blood pressure 133/71, pulse 88, temperature 97.6 F (36.4 C), resp. rate 16, height 5\' 6"  (1.676 m), weight 97.3 kg, SpO2 93 %.   General: Alert and oriented; appropriate, brighter affect, NAD HEENT: conjugate gaze Neck: Supple without JVD or lymphadenopathy Heart: RRR Chest: CTA B/L- no W/R/R- good air movement Abdomen: Soft, NT, ND, (+)BS  Extremities: 1+ edema in left leg Skin: red, erythema; surrounding hip incision- staples out- deep red Neuro: Ox3  Cranial nerves 2-12 are intact. Left lower extremity: Hip flexion 2/5 (pain inhibition), ankle dorsiflexion 4+/5. Left hip with edema and tenderness  Psych: Pt's affect is appropriate. Pt is cooperative  Assessment/Plan: 1. Functional deficits secondary to left hip fracture which require 3+ hours per day of interdisciplinary therapy in a comprehensive inpatient rehab setting.  Physiatrist is providing close team supervision and 24 hour management of active medical problems listed below.  Physiatrist and rehab team continue to assess barriers to discharge/monitor patient progress toward functional and medical goals  Care Tool:  Bathing    Body parts bathed by patient: Right arm, Left arm, Chest, Abdomen,  Right upper leg, Left upper leg, Face   Body parts bathed by helper: Left lower leg, Right lower leg, Buttocks     Bathing assist Assist Level: Maximal Assistance - Patient 24 - 49%     Upper Body Dressing/Undressing Upper body dressing   What is the patient wearing?: Pull over shirt    Upper body assist Assist Level: Moderate Assistance - Patient 50 - 74%    Lower Body Dressing/Undressing Lower body dressing      What is the patient wearing?: Pants, Incontinence brief     Lower body assist Assist for lower body dressing: Total Assistance - Patient < 25%     Toileting Toileting    Toileting assist Assist for toileting: Total Assistance - Patient < 25%     Transfers Chair/bed transfer  Transfers assist     Chair/bed transfer assist level: Minimal Assistance - Patient > 75% Chair/bed transfer assistive device: Programmer, multimedia   Ambulation assist      Assist level: 2 helpers Assistive device: Walker-rolling Max distance: 55'   Walk 10 feet activity   Assist  Walk 10 feet activity did not occur: Safety/medical concerns  Assist level: 2 helpers Assistive device: Walker-rolling   Walk 50 feet activity   Assist Walk 50 feet with 2 turns activity did not occur: Safety/medical concerns         Walk 150 feet activity   Assist Walk 150 feet activity did not occur: Safety/medical concerns  Walk 10 feet on uneven surface  activity   Assist Walk 10 feet on uneven surfaces activity did not occur: Safety/medical concerns         Wheelchair     Assist Will patient use wheelchair at discharge?: Yes (Per PT long-term goals) Type of Wheelchair: Manual    Wheelchair assist level: Supervision/Verbal cueing Max wheelchair distance: 50    Wheelchair 50 feet with 2 turns activity    Assist        Assist Level: Supervision/Verbal cueing   Wheelchair 150 feet activity     Assist      Assist Level:  Moderate Assistance - Patient 50 - 74%   Blood pressure 133/71, pulse 88, temperature 97.6 F (36.4 C), resp. rate 16, height 5\' 6"  (1.676 m), weight 97.3 kg, SpO2 93 %.  Medical Problem List and Plan: 1.  Deficits with mobility, transfers, self-care secondary to left hip fracture status post IM nail.  8/10- surgery was 8/2- needs staples out 8/16.              8/11- staple area look a little cellulitic-adding Keflex for UTI- will also cover skin bacteria- will monitor   8/12- will remove every OTHER staple- placed order and let nursing know verbally             -ELOS/Goals: 15-19 days/min A             Continue CIR 2. H/o PE/ Antithrombotics: -DVT/anticoagulation:  Pharmaceutical: Xarelto             -antiplatelet therapy: N/A 3. Pain Management: Hydrocodone as needed--uncontrolled increase norco to 7.5mg    q 6h 4. Mood: LCSW to follow for evaluation and support  8/10- still having pain issues- said tylenol does better than Norco- will con't tylenol, tramadol, and Norco 1-2 tabs q6 hours prn   8/12- fewer pain complaints today- missed some therapy yesterday due to nap  8/15: Pain worse today with just sitting at EOB. Increased Norco frequency to q4H.  8/16- pain better controlled per pt.              -antipsychotic agents: N/A 5. Neuropsych: This patient is capable of making decisions on her own behalf. 6. Skin/Wound Care: Monitor wound for healing.  Added protein supplements to promote healing. Increased erythema surrounding left hip staples- contacted Dr. Sid Falcon office and physician or PA to see patient tomorrow. Continue IV antibiotics.   8/16- Ortho to see pt today- on IV ABX.  7. Fluids/Electrolytes/Nutrition: Monitor I's/O.  Encourage fluid intake.  CMP ordered for tomorrow a.m.  8/11- Cr 0.94- con't to monitor 8. GERD: Continue Protonix with Carafate as needed indigestion  8/9- no complaints- con't regimen 9. ABLA: Added iron supplement.  Hgb 7.4 on 8/5, repeat Monday.   8/9-  Hb 8.7-  10.  Tachycardia: Continue to monitor heart rate/BP 3 times daily.  On Lopressor 12.5 mg twice daily. Well controlled             Monitor with increased activity. 11.  Acute renal injury: Encourage fluid intake.               Creatinine much improved to 1.01.   8/9- Cr 0.94  8/13- per pharmacy, renal fxn slightly decreased due to age 82.  Supplemental oxygen dependent             Wean as tolerated 13. Dysuria/UTI  8/10- will check U/A and Cx due to dysuria, frequency and urgency.  8/11- Has  large leuks and (+) nitrites and WBC >50- will start Keflex 500 mg BID- is allergic to sulfa.  8/12- >100k GNR- species/sensitivities to follow - Sx's slightly better per pt.    8/13- Citrobacter- UTI- due to combined UTI and cellulitis, change to Ceftriaxone 1 g q24 hours. 14. Cellulitis  8/12- change to Rocephin 1 g q24 hours to cover both UTI and cellulitis.  8/16- not significantly better since Friday- pt thinks "she's allergic to staples" but staples are out- looks like cellulitis.    LOS: 12 days A FACE TO FACE EVALUATION WAS PERFORMED  Yadir Zentner 08/06/2020, 8:27 AM

## 2020-08-06 NOTE — Plan of Care (Signed)
  Problem: Consults Goal: RH GENERAL PATIENT EDUCATION Description: See Patient Education module for education specifics. Outcome: Progressing Goal: Skin Care Protocol Initiated - if Braden Score 18 or less Description: If consults are not indicated, leave blank or document N/A Outcome: Progressing Goal: Nutrition Consult-if indicated Outcome: Progressing   Problem: RH BOWEL ELIMINATION Goal: RH STG MANAGE BOWEL WITH ASSISTANCE Description: STG Manage Bowel with Assistance. Outcome: Progressing Goal: RH STG MANAGE BOWEL W/MEDICATION W/ASSISTANCE Description: STG Manage Bowel with Medication with Assistance. Outcome: Progressing   Problem: RH BLADDER ELIMINATION Goal: RH STG MANAGE BLADDER WITH ASSISTANCE Description: STG Manage Bladder With Moderate Assistance Outcome: Progressing Goal: RH STG MANAGE BLADDER WITH MEDICATION WITH ASSISTANCE Description: STG Manage Bladder With Medication With Assistance. Outcome: Progressing Goal: RH STG MANAGE BLADDER WITH EQUIPMENT WITH ASSISTANCE Description: STG Manage Bladder With Equipment With Assistance Outcome: Progressing   Problem: RH SKIN INTEGRITY Goal: RH STG SKIN FREE OF INFECTION/BREAKDOWN Outcome: Progressing Goal: RH STG MAINTAIN SKIN INTEGRITY WITH ASSISTANCE Description: STG Maintain Skin Integrity With Assistance. Outcome: Progressing Goal: RH STG ABLE TO PERFORM INCISION/WOUND CARE W/ASSISTANCE Description: STG Able To Perform Incision/Wound Care With Assistance. Outcome: Progressing   Problem: RH SAFETY Goal: RH STG ADHERE TO SAFETY PRECAUTIONS W/ASSISTANCE/DEVICE Description: STG Adhere to Safety Precautions With Assistance/Device. Outcome: Progressing Goal: RH STG DECREASED RISK OF FALL WITH ASSISTANCE Description: STG Decreased Risk of Fall With Assistance. Outcome: Progressing   Problem: RH PAIN MANAGEMENT Goal: RH STG PAIN MANAGED AT OR BELOW PT'S PAIN GOAL Outcome: Progressing   Problem: RH KNOWLEDGE  DEFICIT GENERAL Goal: RH STG INCREASE KNOWLEDGE OF SELF CARE AFTER HOSPITALIZATION Outcome: Progressing

## 2020-08-06 NOTE — Progress Notes (Signed)
This Pryor Curia has cared for patient on multiple occasions when she has exhibited an unwillingness to get out of bed to toilet at night.  Instead, patient calls frequently for nursing staff to place a urinal and wait until she is able to start her stream and void.  Many times this has resulted in patient's falling asleep and/or soiling herself and bed.  Patient has demonstrated the ability to stand and pivot to [facility] but she is unwilling to do so consistently.  This Pryor Curia has reinforced teaching and encouraged patient OOB to promote healing, maintain skin integrity and support rehab goals.   Patient called out this evening and again requested that a urinal be placed.   When told that staff would get her up to [facility], patient refused and adamanently informed staff that "I am not getting up to the [facility]  I get up during the day for rehab and I work hard.  I will just pee in the bed and you'll have to change me".  Teaching again reinforced and Probation officer informed patient that current behaviors are not conducive to achieving rehab goals and discharge to home.  Cammeron Greis Montey Hora, MSN, RN, CNL IP Rehab

## 2020-08-06 NOTE — Progress Notes (Signed)
Occupational Therapy Session Note  Patient Details  Name: Stacy Valencia MRN: [id number] Date of Birth: 06-02-1933  Today's Date: 08/06/2020 OT Individual Time: 1157-2620 and 3559-7416 OT Individual Time Calculation (min): 56 min and 57 min   Short Term Goals: Week 2:  OT Short Term Goal 1 (Week 2): Pt will complete toilet transfers with Min A OT Short Term Goal 2 (Week 2): Pt will perform LB dressing with AE with Mod A OT Short Term Goal 3 (Week 2): Pt will peform sit <> stand consistently with Min A OT Short Term Goal 4 (Week 2): Pt will improve endurance to perform ADL for 10+ mins with no rest break   Skilled Therapeutic Interventions/Progress Updates:    Pt greeted at time of session supine in bed, agreeable to OT with encouragement. Pt stating she is very tired but encouraged to participate in OT session to maximize independence. Supine to sitting EOB with Min A to manage LLE, Sit to stand with Min/Mod with instructions to push up from bed surface but difficulty d/t new IV in R hand. SPT to wheelchair CGA. Brought to bathroom and performed SPT with CGA with use of grab bars (husband is having installed into their home bathroom), Max for clothing management d/t pain and tight brief. Pt did have very small BM, assist to thoroughly wipe. Donned new brief in underwear fashion with assist to thread feet, Max A to don over hips while pt in static standing. Brief washing up seated on toilet with Supervision for UB bathing and pt able to wash front periarea with supervision seated, declined bathing rest of her body. SPT back to wheelchair CGA, SPT to bed with RW CGA, Sit to supine Min A to manage LLE. Alarm on, call bell in reach. Nursing aware of pain, going to check on pain meds.  Session 2: Pt greeted at time of session sitting up in wheelchair uncomfortable and requesting to be put back to bed asap d/t discomfort. Pt had not been to the bathroom in the past couple hours, educated on importance of  using toilet instead of urinal in bed, brought to toilet via wheelchair and SPT to toilet with CGA/Min with cues for hand/foot placement. Max A for clothing management d/t pt stating she cannot remove one UE to perform task, pt able to perform hygiene today after urinating. Educated on energy conservation and work simplification during toileting. SPT back to wheelchair CGA, husband present for education throughout and is aware of how much help she needs. Set up at sink level for oral care, performed with set up. Husband and pt had several questions about potential of wheelchair for home use, educated on doorway widths and maneuvering in a home environment. Extensive rest breaks required in between activity for pt to recover prior to performing SPT back to bed with CGA with RW. Sit to supine Min A to manage LLE. Alarm on, call bell in reach.    Therapy Documentation Precautions:  Precautions Precautions: Fall Restrictions Weight Bearing Restrictions: No LLE Weight Bearing: Weight bearing as tolerated     Therapy/Group: Individual Therapy  Viona Gilmore 08/06/2020, 12:39 PM

## 2020-08-06 NOTE — Consult Note (Signed)
Neuropsychological Consultation   Patient:   Loreda Silverio   DOB:   05-22-1933  MR Number:  [id number]  Location:  [facility] [address] CENTER B [place] [id number] [place] [address] Dept: [place]: [phone]           Date of Service:   08/06/2020  Start Time:   8 AM End Time:   9 AM  Provider/Observer:  Ilean Skill, Psy.D.       Clinical Neuropsychologist       Billing Code/Service: [id number]  Chief Complaint:    Emili Mcloughlin is an 86 year old female with a history of PE/DVT, GERD who was admitted on 07/22/2020 after fall with resulting left hip pain.  The patient gotten up in the middle the night to go to the bathroom and stepped on a floor scale that it been moved by someone else resulting in fall into the bathroom sink cabinet.  Patient was evaluated and underwent IM nail fixation of left femur on 07/23/2020.  Hospital course complicated with period of hypoxia and tachycardia due to IV narcotics.  Mentation improved once Dilaudid was stopped and pain is currently being controlled on hydrocodone as needed.  Patient has had some difficulty during her extended hospital stay with coping and adjustment.  Reason for Service:  The patient was referred for neuropsychological consultation due to coping and adjustment issues.  Below is a HPI for the consultation.  HPI: Kamille Toomey is an 86 year old female with history of PE/DVT--chronic AC (per Dr. Alen Blew), GERD who was admitted on 07/22/2020 after a fall with resulting left hip pain.  History taken from chart review, husband, and patient. She was found to have comminuted, mildly displaced angulated IT proximal left femur fracture.  She was evaluated by Dr. Lyla Glassing and underwent IM nail fixation of left femur on 07/23/2020.  Postop to be WBAT and Xarelto resumed on 08/03.  Hospital course further complicated by hypoxia and tachycardia due to to IV narcotics.  She required 4 L  of supplemental oxygen as well as IV metoprolol for rate control and has been transitioned to low-dose p.o. metoprolol .  Mentation improved off Dilaudid and pain currently controlled on hydrocodone as needed. Follow-up labs showed acute blood loss anemia with H&H down to 8.2/25.6 as well as hyponatremia, with acute renal failure --BUN/SCr 26/1.30. Therapy ongoing and patient working on pregait activity in [employer] lift.  CIR recommended due to functional decline.  Please see preadmission assessment from earlier today as well.  Current Status:  The patient reports that she has had some difficulty including some anxiousness about extended hospital stay.  The patient reports that doing the physical therapies are very painful to her and the nursing checks in the middle the night are difficult for her to get good sleep through the night.  We reviewed issues related to her extended hospital stay and the reason for her rehab to get maximum recovery from her fractured hip.  Patient reports that her pain is improving somewhat but continues to be very painful when she puts any type of weight on her leg.  However, she reports that her pain is better when she is lying in the bed and that is a positive improvement for her.  Behavioral Observation: Doralyn Kirkes  presents as a 86 y.o.-year-old Right Caucasian Female who appeared her stated age. her dress was Appropriate and she was Well Groomed and her manners were Appropriate to the situation.  her participation was indicative of Appropriate and Attentive behaviors.  There were any physical disabilities noted.  she displayed an appropriate level of cooperation and motivation.     Interactions:    Active Appropriate and Attentive  Attention:   within normal limits and attention span and concentration were age appropriate  Memory:   within normal limits; recent and remote memory intact  Visuo-spatial:  not examined  Speech (Volume):  normal  Speech:   normal;  normal  Thought Process:  Coherent and Relevant  Though Content:  WNL; not suicidal and not homicidal  Orientation:   person, place, time/date and situation  Judgment:   Good  Planning:   Good  Affect:    Anxious  Mood:    Anxious and Dysphoric  Insight:   Good  Intelligence:   normal  Medical History:   Past Medical History:  Diagnosis Date  . GERD (gastroesophageal reflux disease)   . PE (pulmonary embolism) 2005    Psychiatric History:  No prior psychiatric history  Family Med/Psych History:  Family History  Problem Relation Age of Onset  . Pulmonary embolism Neg Hx    Impression/DX:  Jilliana Burkes is an 86 year old female with a history of PE/DVT, GERD who was admitted on 07/22/2020 after fall with resulting left hip pain.  The patient gotten up in the middle the night to go to the bathroom and stepped on a floor scale that it been moved by someone else resulting in fall into the bathroom sink cabinet.  Patient was evaluated and underwent IM nail fixation of left femur on 07/23/2020.  Hospital course complicated with period of hypoxia and tachycardia due to IV narcotics.  Mentation improved once Dilaudid was stopped and pain is currently being controlled on hydrocodone as needed.  Patient has had some difficulty during her extended hospital stay with coping and adjustment.  The patient reports that she has had some difficulty including some anxiousness about extended hospital stay.  The patient reports that doing the physical therapies are very painful to her and the nursing checks in the middle the night are difficult for her to get good sleep through the night.  We reviewed issues related to her extended hospital stay and the reason for her rehab to get maximum recovery from her fractured hip.  Patient reports that her pain is improving somewhat but continues to be very painful when she puts any type of weight on her leg.  However, she reports that her pain is better when she is  lying in the bed and that is a positive improvement for her.   Disposition/Plan:  I will follow up with patient towards the end of this week or first of next week.  The patient does have some anxiety and difficulty with adjustment and we addressed some of her concerns today.  One of the issues that the patient has had is with regard to timing of her pain medications.  She was not clear on the fact that she can ask for pain medicines during the day at certain intervals but she will not automatically be given them.  She was aware of getting her morning meds but had not been asking for her medications prior to therapies so we are able to adjust to one of her concerns.  I have explained this component to the patient.  Diagnosis:    Left Hip Fracture        Electronically Signed   _______________________ Ilean Skill, Psy.D.

## 2020-08-06 NOTE — Progress Notes (Signed)
Physical Therapy Session Note  Patient Details  Name: Stacy Valencia MRN: [id number] Date of Birth: 1932/12/30  Today's Date: 08/06/2020 PT Individual Time: 1049-1200 PT Individual Time Calculation (min): 71 min   Short Term Goals: Week 2:  PT Short Term Goal 1 (Week 2): Pt will ambulate 6' with CGA PT Short Term Goal 2 (Week 2): Pt will complete 4 steps with BHRs and minA PT Short Term Goal 3 (Week 2): Pt will perform bed to chair transfer with CGA  Skilled Therapeutic Interventions/Progress Updates:     Pt received supine in bed and agreeable to therapy. Reports pain in low back, heart burn pain, pain in LLE, and pain in arms. Number not provided. PT provides rest breaks as needed and distraction to manage pain symptoms.  Supine to sit with bed features and minA to manage LLE. Stand pivot transfer from bed to [facility] with minA and use of RW. WC transport to gym for time management. SPT to mat table with minA and sit to supine with minA to manage LLE.   Pt performs supine therex 1x15: Quad Sets Glute Sets SAQs Hip Abduction (AAROM) SLR (AAROM)  Pt performs supine to sit with cues on hand placement for improved efficiency and ease of transfer. Sit to stand with minA. Pt ambulates bouts of 50' and 54' with RW and minA. Extended seated rest break in between bouts.  SPT to [place] with minA. Pt left seated in WC with alarm intact and all needs within reach.  Therapy Documentation Precautions:  Precautions Precautions: Fall Restrictions Weight Bearing Restrictions: No LLE Weight Bearing: Weight bearing as tolerated    Therapy/Group: Individual Therapy  Breck Coons, PT, DPT 08/06/2020, 4:00 PM

## 2020-08-07 ENCOUNTER — Inpatient Hospital Stay (HOSPITAL_COMMUNITY): Payer: Medicare Other

## 2020-08-07 ENCOUNTER — Inpatient Hospital Stay (HOSPITAL_COMMUNITY): Payer: Medicare Other | Admitting: Occupational Therapy

## 2020-08-07 LAB — CBC WITH DIFFERENTIAL/PLATELET
Abs Immature Granulocytes: 0.13 10*3/uL — ABNORMAL HIGH (ref 0.00–0.07)
Basophils Absolute: 0.1 10*3/uL (ref 0.0–0.1)
Basophils Relative: 1 %
Eosinophils Absolute: 0.2 10*3/uL (ref 0.0–0.5)
Eosinophils Relative: 2 %
HCT: 33.2 % — ABNORMAL LOW (ref 36.0–46.0)
Hemoglobin: 10.2 g/dL — ABNORMAL LOW (ref 12.0–15.0)
Immature Granulocytes: 2 %
Lymphocytes Relative: 32 %
Lymphs Abs: 2.4 10*3/uL (ref 0.7–4.0)
MCH: 33.1 pg (ref 26.0–34.0)
MCHC: 30.7 g/dL (ref 30.0–36.0)
MCV: 107.8 fL — ABNORMAL HIGH (ref 80.0–100.0)
Monocytes Absolute: 0.8 10*3/uL (ref 0.1–1.0)
Monocytes Relative: 11 %
Neutro Abs: 3.9 10*3/uL (ref 1.7–7.7)
Neutrophils Relative %: 52 %
Platelets: 449 10*3/uL — ABNORMAL HIGH (ref 150–400)
RBC: 3.08 MIL/uL — ABNORMAL LOW (ref 3.87–5.11)
RDW: 17.9 % — ABNORMAL HIGH (ref 11.5–15.5)
WBC: 7.5 10*3/uL (ref 4.0–10.5)
nRBC: 0 % (ref 0.0–0.2)

## 2020-08-07 MED ORDER — CIPROFLOXACIN HCL 500 MG PO TABS
500.0000 mg | ORAL_TABLET | Freq: Two times a day (BID) | ORAL | Status: DC
  Administered 2020-08-07 – 2020-08-10 (×7): 500 mg via ORAL
  Filled 2020-08-07 (×7): qty 1

## 2020-08-07 NOTE — Progress Notes (Signed)
Patient ID: Cleon Thoma, female   DOB: 06-29-1933, 86 y.o.   MRN: [id number]  SW met with pt in room to provide updates from team conference, and change from 8/24 to 8/20. SW informed pt will follow-up with her husband to discuss further.   SW called pt husband Herbie [place] [phone]) to provide above updates, and recommended DME: RW. SW to order RW. He will be here tomorrow for family edu at [facility]. He expressed concerns about getting the home ready for pt now with a change in her d/c date. Also asked if pt can be assisted up 5 steps with R hand rails going up -through garage entrance; with hand held assistance by their dtr and him following behind patient. SW encouraged him to discuss further during family edu.   SW ordered DME: RW with [employer] via parachute. SW to discuss [facility] preference for HHPT/OT.  Loralee Pacas, MSW, [place] Office: [phone] Cell: [phone] Fax: [phone]

## 2020-08-07 NOTE — Progress Notes (Signed)
[place] PHYSICAL MEDICINE & REHABILITATION PROGRESS NOTE   Subjective/Complaints:  Pt reports no itching of L hip- but thinks irritated because she has " sensitive skin".   Per nursing, required in and out cath last night x1- and kept voiding on self, even though is able to get up to [facility] pt said "just didn't feel like it" and "was mad they pushed".   Explained part of rehab is what she does at night as well as during day and needs to get up- also likely wouldn't have required cath if got up to [facility].   Got switched to Doxycycline on Sunday night- so only got 2 days of rocephin, therefore not adequate tx for UTI.     ROS-   Pt denies SOB, abd pain, CP, N/V/C/D, and vision changes   Objective:   No results found. Recent Labs    08/07/20 0706  WBC 7.5  HGB 10.2*  HCT 33.2*  PLT 449*   Recent Labs    08/06/20 0450  NA 134*  K 3.9  CL 104  CO2 23  GLUCOSE 101*  BUN 9  CREATININE 0.88  CALCIUM 8.7*    Intake/Output Summary (Last 24 hours) at 08/07/2020 0831 Last data filed at 08/07/2020 0453 Gross per 24 hour  Intake 460 ml  Output 800 ml  Net -340 ml     Physical Exam: Vital Signs Blood pressure 125/61, pulse 97, temperature 98.2 F (36.8 C), resp. rate 17, height 5\' 6"  (1.676 m), weight 97.3 kg, SpO2 93 %.   General: alert, frustrated because was told needs to get OOB to void, NAD HEENT: conjugate gaze Neck: Supple without JVD or lymphadenopathy Heart: RRR Chest: CTA B/L- no W/R/R- good air movement Abdomen: Soft, NT, ND, (+)BS  Extremities: 1+ edema in left leg- no change Skin: scabbing over some of L hip- staples out- redness slightly less today Neuro: Ox3  Cranial nerves 2-12 are intact. Left lower extremity: Hip flexion 2/5 (pain inhibition), ankle dorsiflexion 4+/5. Left hip with edema and tenderness  Psych: pt frustrated about getting up  Assessment/Plan: 1. Functional deficits secondary to left hip fracture which require 3+ hours per day of  interdisciplinary therapy in a comprehensive inpatient rehab setting.  Physiatrist is providing close team supervision and 24 hour management of active medical problems listed below.  Physiatrist and rehab team continue to assess barriers to discharge/monitor patient progress toward functional and medical goals  Care Tool:  Bathing    Body parts bathed by patient: Right arm, Left arm, Chest, Abdomen, Face, Front perineal area, Buttocks   Body parts bathed by helper: Buttocks     Bathing assist Assist Level: Maximal Assistance - Patient 24 - 49%     Upper Body Dressing/Undressing Upper body dressing   What is the patient wearing?: Pull over shirt    Upper body assist Assist Level: Minimal Assistance - Patient > 75%    Lower Body Dressing/Undressing Lower body dressing      What is the patient wearing?: Incontinence brief     Lower body assist Assist for lower body dressing: Maximal Assistance - Patient 25 - 49%     Toileting Toileting    Toileting assist Assist for toileting: Maximal Assistance - Patient 25 - 49%     Transfers Chair/bed transfer  Transfers assist     Chair/bed transfer assist level: Minimal Assistance - Patient > 75% Chair/bed transfer assistive device: Programmer, multimedia   Ambulation assist  Assist level: Minimal Assistance - Patient > 75% Assistive device: Walker-rolling Max distance: 60'   Walk 10 feet activity   Assist  Walk 10 feet activity did not occur: Safety/medical concerns  Assist level: Minimal Assistance - Patient > 75% Assistive device: Walker-rolling   Walk 50 feet activity   Assist Walk 50 feet with 2 turns activity did not occur: Safety/medical concerns  Assist level: Minimal Assistance - Patient > 75% Assistive device: Walker-rolling    Walk 150 feet activity   Assist Walk 150 feet activity did not occur: Safety/medical concerns         Walk 10 feet on uneven surface   activity   Assist Walk 10 feet on uneven surfaces activity did not occur: Safety/medical concerns         Wheelchair     Assist Will patient use wheelchair at discharge?: Yes (Per PT long-term goals) Type of Wheelchair: Manual    Wheelchair assist level: Supervision/Verbal cueing Max wheelchair distance: 50    Wheelchair 50 feet with 2 turns activity    Assist        Assist Level: Supervision/Verbal cueing   Wheelchair 150 feet activity     Assist      Assist Level: Moderate Assistance - Patient 50 - 74%   Blood pressure 125/61, pulse 97, temperature 98.2 F (36.8 C), resp. rate 17, height 5\' 6"  (1.676 m), weight 97.3 kg, SpO2 93 %.  Medical Problem List and Plan: 1.  Deficits with mobility, transfers, self-care secondary to left hip fracture status post IM nail.  8/10- surgery was 8/2- needs staples out 8/16.              8/11- staple area look a little cellulitic-adding Keflex for UTI- will also cover skin bacteria- will monitor   8/12- will remove every OTHER staple- placed order and let nursing know verbally  8/17- staples out- on Doxycycline- looking slightly better             -ELOS/Goals: 15-19 days/min A             Continue CIR 2. H/o PE/ Antithrombotics: -DVT/anticoagulation:  Pharmaceutical: Xarelto             -antiplatelet therapy: N/A 3. Pain Management: Hydrocodone as needed--uncontrolled increase norco to 7.5mg    q 6h 4. Mood: LCSW to follow for evaluation and support  8/10- still having pain issues- said tylenol does better than Norco- will con't tylenol, tramadol, and Norco 1-2 tabs q6 hours prn   8/12- fewer pain complaints today- missed some therapy yesterday due to nap  8/15: Pain worse today with just sitting at EOB. Increased Norco frequency to q4H.  8/17- said pain better this AM             -antipsychotic agents: N/A 5. Neuropsych: This patient is capable of making decisions on her own behalf. 6. Skin/Wound Care: Monitor  wound for healing.  Added protein supplements to promote healing. Increased erythema surrounding left hip staples- contacted Dr. Sid Falcon office and physician or PA to see patient tomorrow. Continue IV antibiotics.   8/17- changed to Doxycycline on Sunday night- redness slightly better-  7. Fluids/Electrolytes/Nutrition: Monitor I's/O.  Encourage fluid intake.  CMP ordered for tomorrow a.m.  8/11- Cr 0.94- con't to monitor 8. GERD: Continue Protonix with Carafate as needed indigestion  8/9- no complaints- con't regimen 9. ABLA: Added iron supplement.  Hgb 7.4 on 8/5, repeat Monday.   8/9- Hb 8.7-  10.  Tachycardia:  Continue to monitor heart rate/BP 3 times daily.  On Lopressor 12.5 mg twice daily. Well controlled             Monitor with increased activity. 11.  Acute renal injury: Encourage fluid intake.               Creatinine much improved to 1.01.   8/9- Cr 0.94  8/13- per pharmacy, renal fxn slightly decreased due to age 17.  Supplemental oxygen dependent             Wean as tolerated 13. Dysuria/UTI  8/10- will check U/A and Cx due to dysuria, frequency and urgency.  8/11- Has large leuks and (+) nitrites and WBC >50- will start Keflex 500 mg BID- is allergic to sulfa.  8/12- >100k GNR- species/sensitivities to follow - Sx's slightly better per pt.    8/13- Citrobacter- UTI- due to combined UTI and cellulitis, change to Ceftriaxone 1 g q24 hours  8/17- changed to Cipro- since off Rocephin- and  UTI Sx's not better on Doxycycline, which it was changed to Sunday nite.   14. Cellulitis  8/12- change to Rocephin 1 g q24 hours to cover both UTI and cellulitis.  8/16- not significantly better since Friday- pt thinks "she's allergic to staples" but staples are out- looks like cellulitis.   8/17- on Doxycycline- looks a little better   LOS: 13 days A FACE TO FACE EVALUATION WAS PERFORMED  Shanda Cadotte 08/07/2020, 8:31 AM

## 2020-08-07 NOTE — Progress Notes (Signed)
Occupational Therapy Session Note  Patient Details  Name: Stacy Valencia MRN: [id number] Date of Birth: [birth year]  Today's Date: 08/07/2020 OT Individual Time: 0800-0910 OT Individual Time Calculation (min): 70 min    Short Term Goals: Week 2:  OT Short Term Goal 1 (Week 2): Pt will complete toilet transfers with Min A OT Short Term Goal 2 (Week 2): Pt will perform LB dressing with AE with Mod A OT Short Term Goal 3 (Week 2): Pt will peform sit <> stand consistently with Min A OT Short Term Goal 4 (Week 2): Pt will improve endurance to perform ADL for 10+ mins with no rest break  Skilled Therapeutic Interventions/Progress Updates:    Pt greeted at time of session supine in bed resting but agreeable to OT session with encouragement. Supine to sitting EOB with extended time d/t fear of pain in L knee, supine to sit Min A to manage LLE and instruction to pull/use bed features to assist. Sit to stand Min A from bed surface (harder from softer surface) and SPT to chair with CGA with RW, able to control descent better today with cues and not flop into chair to preserve back. Educated again on importance of betting OOB for toileting, using BSC or actual toilet with nursing staff to promote full emptying of bladder and promote healing. Set up at wheelchair level at sink for oral hygiene set up, as well as brushing hair. UB dress Supervision, LB dress Max with assist to thread BLEs cues to thread LLE first, simulated as if using reacher but need to provide (husband has ordered hip kit) with sit to stand CGA and therapist assist to don over hips as pt is unable/declines to stand with unilateral support at this time. Returned to sitting and applied moist heat to back for 10 minutes, skin in tact pre and post to decrease low back pain, which did slightly subside. SPT back to bed with CGA with RW, side stepping toward HOB in the same manner. Sit to supine Min to manage LLE, scooted up in bed supervision with bed  features. Alarm on, call bell in reach.   Therapy Documentation Precautions:  Precautions Precautions: Fall Restrictions Weight Bearing Restrictions: No LLE Weight Bearing: Weight bearing as tolerated     Therapy/Group: Individual Therapy  Viona Gilmore 08/07/2020, 9:13 AM

## 2020-08-07 NOTE — Progress Notes (Signed)
Patient has chosen to be incontinent throughout the night, refusing to call staff,  with multiple episodes in the bed.  During morning rounds, patient was again found lying in bed saturated with urine.  Bladder scan performed with resultant 450 mL.  This author informed patient that it would be best to get OOB to [facility] for complete emptying of bladder.  Patient refused and voided in the bed again in the presence of nursing staff.  Straight cath performed with 400 mL urine output.  Personal care provided with complete bed change and pericare.  Patient continues to complain of severe left hip and leg pain with touch, movement and weightbearing.  Prominent redness and bruising noted to LLE.  Britainy Kozub Montey Hora, MSN, RN, CNL IP Rehab

## 2020-08-07 NOTE — Plan of Care (Signed)
  Problem: Consults Goal: RH GENERAL PATIENT EDUCATION Description: See Patient Education module for education specifics. Outcome: Progressing Goal: Skin Care Protocol Initiated - if Braden Score 18 or less Description: If consults are not indicated, leave blank or document N/A Outcome: Progressing Goal: Nutrition Consult-if indicated Outcome: Progressing   Problem: RH BOWEL ELIMINATION Goal: RH STG MANAGE BOWEL WITH ASSISTANCE Description: STG Manage Bowel with Assistance. Outcome: Progressing Goal: RH STG MANAGE BOWEL W/MEDICATION W/ASSISTANCE Description: STG Manage Bowel with Medication with Assistance. Outcome: Progressing   Problem: RH BLADDER ELIMINATION Goal: RH STG MANAGE BLADDER WITH ASSISTANCE Description: STG Manage Bladder With Moderate Assistance Outcome: Progressing Goal: RH STG MANAGE BLADDER WITH MEDICATION WITH ASSISTANCE Description: STG Manage Bladder With Medication With Assistance. Outcome: Progressing Goal: RH STG MANAGE BLADDER WITH EQUIPMENT WITH ASSISTANCE Description: STG Manage Bladder With Equipment With Assistance Outcome: Progressing   Problem: RH SKIN INTEGRITY Goal: RH STG SKIN FREE OF INFECTION/BREAKDOWN Outcome: Progressing Goal: RH STG MAINTAIN SKIN INTEGRITY WITH ASSISTANCE Description: STG Maintain Skin Integrity With Assistance. Outcome: Progressing Goal: RH STG ABLE TO PERFORM INCISION/WOUND CARE W/ASSISTANCE Description: STG Able To Perform Incision/Wound Care With Assistance. Outcome: Progressing   Problem: RH SAFETY Goal: RH STG ADHERE TO SAFETY PRECAUTIONS W/ASSISTANCE/DEVICE Description: STG Adhere to Safety Precautions With Assistance/Device. Outcome: Progressing Goal: RH STG DECREASED RISK OF FALL WITH ASSISTANCE Description: STG Decreased Risk of Fall With Assistance. Outcome: Progressing   Problem: RH PAIN MANAGEMENT Goal: RH STG PAIN MANAGED AT OR BELOW PT'S PAIN GOAL Outcome: Progressing   Problem: RH KNOWLEDGE  DEFICIT GENERAL Goal: RH STG INCREASE KNOWLEDGE OF SELF CARE AFTER HOSPITALIZATION Outcome: Progressing

## 2020-08-07 NOTE — Patient Care Conference (Signed)
Inpatient RehabilitationTeam Conference and Plan of Care Update Date: 08/07/2020   Time: 11:31 AM    Patient Name: Stacy Valencia      Medical Record Number: [id number]  Date of Birth: 19-Jan-1933 Sex: Female         Room/Bed: 4M02C/4M02C-01 Payor Info: Payor: MEDICARE / Plan: MEDICARE PART A AND B / Product Type: *No Product type* /    Admit Date/Time:  07/25/2020  6:12 PM  Primary Diagnosis:  Hip fracture [facility])  Hospital Problems: Principal Problem:   Hip fracture ([place]) Active Problems:   Femur fracture, left ([place])   Adjustment disorder with anxious mood    Expected Discharge Date: Expected Discharge Date: 08/10/20  Team Members Present: Physician leading conference: Dr. Courtney Heys Care Coodinator Present: Loralee Pacas, LCSWA;Jaquann Guarisco Creig Hines, RN, BSN, [employer] Nurse Present: Other (comment) Dina Rich, RN) PT Present: Tereasa Coop, PT OT Present: Lillia Corporal, OT PPS Coordinator present : Ileana Ladd, Burna Mortimer, SLP     Current Status/Progress Goal Weekly Team Focus  Bowel/Bladder   Patient has urinary urgency and retention; recent treatment for UTI.  Last documented BM 08/04/20  Patient will have improved GI/GU control/function.  Assess GU function Qshift and PRN.  Encourage water intake, mobility and dietary fiber.  Encourage OOB to [facility] for more complete bladder emptying.   Swallow/Nutrition/ Hydration             ADL's   CGA/Min for bed > wheelchair and toilet transfers, Max for LB dressing/bathing, Supervision-Min for UB ADLs, able to static stand with assist for pants/hygiene  Min for LB, Supervision for UB  ADL transfers, standing balance/tolerance, LB ADLs   Mobility   minA bed mobility, minA sit to stand, minA 60' ambulation  supervision  stairs, ambulation, LLE strength, activity tolerance, DC prep   Communication             Safety/Cognition/ Behavioral Observations            Pain   Patient c/o constant left hip often unrelieved by current  analgesic regimen.  Patient will have improved pain level less than 4 with or without increased activity tolerance.  Assess pain level Qshift and PRN.  Encourage mobility and non-pharmacological interventions.   Skin   Left hip surgical sites x2 - staples removed - redness to sites remain  Patient will be free of infection or skin breakdown  Assess surgical sites Qshift and PRN.  Monitor for s/sx of infection.  Encourage mobility and offloading to prevent skin breakdown.     Discharge Planning:  Discharge to home with husband who will provide care 24/7. Pt has daughter who will also be able to assist with care. Fam edu 8/18 and 8/19 with pt husband at [place] [facility] until completed.   Team Discussion: Positive UTI. Pain never controlled. Contact guard assist with ambulation. Pt needs max encouragement. Needs lots of motivation. Family education Wed/Thurs. PT and OT talked with patient about voiding on herself. Patient on target to meet rehab goals: yes, but needs lots of encouragement.  *See Care Plan and progress notes for long and short-term goals.   Revisions to Treatment Plan:  none  Teaching Needs: Continue family education  Current Barriers to Discharge: Behavior and pain and lack of motivation.  Possible Resolutions to Barriers: MD reminded patient not to void on herself. Pain is limiting but will is not able to be controlled, and pt needs lots of motivation to participate in therapy sessions.     Medical Summary Current  Status: incontinent purposefully; ramped up to needing i/o cath last night- has UTI- cellutlits of L hip  Barriers to Discharge: Behavior;Home enviroment access/layout;Incontinence;Medical stability;Wound care;Medication compliance;Weight;Weight bearing restrictions  Barriers to Discharge Comments: has cellulitis- L hip- now on doxycycline and Cipro for UTI- d/c Friday Possible Resolutions to [employer]: 70 ft CGA RW; lots of encouragement- family ed this week;  no w/c at home; poor motivation- back/L knee pain as well.   Continued Need for Acute Rehabilitation Level of Care: The patient requires daily medical management by a physician with specialized training in physical medicine and rehabilitation for the following reasons: Direction of a multidisciplinary physical rehabilitation program to maximize functional independence : Yes Medical management of patient stability for increased activity during participation in an intensive rehabilitation regime.: Yes Analysis of laboratory values and/or radiology reports with any subsequent need for medication adjustment and/or medical intervention. : Yes   I attest that I was present, lead the team conference, and concur with the assessment and plan of the team.   Cristi Loron 08/07/2020, 3:59 PM

## 2020-08-07 NOTE — Progress Notes (Signed)
Occupational Therapy Session Note  Patient Details  Name: Jacinta Penalver MRN: [id number] Date of Birth: 02-Jun-1933  Today's Date: 08/07/2020 OT Individual Time: 1345-1415 OT Individual Time Calculation (min): 30 min    Short Term Goals: Week 2:  OT Short Term Goal 1 (Week 2): Pt will complete toilet transfers with Min A OT Short Term Goal 2 (Week 2): Pt will perform LB dressing with AE with Mod A OT Short Term Goal 3 (Week 2): Pt will peform sit <> stand consistently with Min A OT Short Term Goal 4 (Week 2): Pt will improve endurance to perform ADL for 10+ mins with no rest break  Skilled Therapeutic Interventions/Progress Updates:    Pt sitting up in w/c, reporting "they worked my legs all morning, can we just do something else"  Pt agreeable to UB strengthening.  Pt c/o tenderness LLE with intermittent repositioning.  Pt transported to small gym for time mgt.  Pt completed left shoulder scaption with 2 lb free weight, scapular retraction using level 2 band, and triceps press using level 2 band.  Pt completed 3 x 15 of each.  Pt transported back to room, call bell in reach, seat alarm on.  Pt needing encouragement to increase participation throughout session.  Therapy Documentation Precautions:  Precautions Precautions: Fall Restrictions Weight Bearing Restrictions: No LLE Weight Bearing: Weight bearing as tolerated     Therapy/Group: Individual Therapy  Ezekiel Slocumb 08/07/2020, 4:31 PM

## 2020-08-07 NOTE — Progress Notes (Signed)
Physical Therapy Session Note  Patient Details  Name: Stacy Valencia MRN: [id number] Date of Birth: November 11, 1933  Today's Date: 08/07/2020 PT Individual Time: 1034-1130 and 1500-1530 PT Individual Time Calculation (min): 56 min and 30 min  Short Term Goals: Week 2:  PT Short Term Goal 1 (Week 2): Pt will ambulate 66' with CGA PT Short Term Goal 2 (Week 2): Pt will complete 4 steps with BHRs and minA PT Short Term Goal 3 (Week 2): Pt will perform bed to chair transfer with CGA  Skilled Therapeutic Interventions/Progress Updates:     1st session: Pt received supine in bed and agreeable to therapy. Reports pain in L knee and low back. Number not provided. PT provides repositioning and rest breaks to manage pain symptoms. Supine to sit with supervision and increased time required. Pt uses bed rails to complete. Sit to stand and stand pivot transfer to [facility] with CGA and cues on hand placement for ease and safety. WC transport to gym for time management.  Pt ambulates bouts of 60', 60', and 61' with extended seated rest breaks. Pt uses RW and PT provides CGA for safety.   PT spends extended time educating pt on importance of mobility, including no longer using urinal for toileting or intentionally being incontinent to avoid having to get out of bed.   Pt left seated in WC with alarm intact and all needs within reach.  2nd Session: Pt received seated in [facility] and agreeable to therapy, through requires some encouragement to participate. Pt says she has done "enough" for today. Pt reports pain in L knee, shoulder, and low back. No number provided. PT provides prolonged rest breaks and repositioning to manage pain levels.  WC transport to gym for time management. Pt performs stair training with 6" steps. Bouts of 4 steps, 3 steps, and 3 steps with BHRs and minA at hips primarily while descending backward for stability. Pt does not demo any buckling of LLE but does verbalize extreme pain throughout.  Pt left  seated in WC and agreeable to therapy;  Therapy Documentation Precautions:  Precautions Precautions: Fall Restrictions Weight Bearing Restrictions: No LLE Weight Bearing: Weight bearing as tolerated    Therapy/Group: Individual Therapy  Breck Coons, PT, DPT 08/07/2020, 3:47 PM

## 2020-08-08 ENCOUNTER — Inpatient Hospital Stay (HOSPITAL_COMMUNITY): Payer: Medicare Other | Admitting: Occupational Therapy

## 2020-08-08 ENCOUNTER — Ambulatory Visit (HOSPITAL_COMMUNITY): Payer: Medicare Other

## 2020-08-08 ENCOUNTER — Encounter (HOSPITAL_COMMUNITY): Payer: Medicare Other | Admitting: Occupational Therapy

## 2020-08-08 NOTE — Progress Notes (Signed)
Occupational Therapy Session Note  Patient Details  Name: Stacy Valencia MRN: [id number] Date of Birth: 13-Aug-1933  Today's Date: 08/08/2020 OT Individual Time: 6503-5465 and 6812-7517 OT Individual Time Calculation (min): 43 min and 72 min   Short Term Goals: Week 2:  OT Short Term Goal 1 (Week 2): Pt will complete toilet transfers with Min A OT Short Term Goal 2 (Week 2): Pt will perform LB dressing with AE with Mod A OT Short Term Goal 3 (Week 2): Pt will peform sit <> stand consistently with Min A OT Short Term Goal 4 (Week 2): Pt will improve endurance to perform ADL for 10+ mins with no rest break  Skilled Therapeutic Interventions/Progress Updates:    Pt greeted at time of session reclined in bed resting but agreeable to OT session with encouragement for family ed session, husband present. Supine to sit with Min A to manage LLE but pt able to bring self up to sitting with extended time, lowered HOB as much as pt could tolerate to simulate home bed. LB dress with reacher, able to thread pants and Min A to don over hips in standing with unilateral support on RW. Sit to stand Min/Mod with cues not to pull from RW but continues to do so despite education. Ambulated to bathroom CGA with RW, transferred to toilet in same manner with cues to control descent, able to perform hygiene with torso twist but therapist performed hygiene as well for thoroughness, husband present and aware of assistance needed. Sit to stand from toilet Min A with RW and husband assist when pants fall to the floor, pt able to assist with donning over hips. Ambulated back to bed CGA with RW, sit to supine Min with husband assisting for BLE management. Ed on use of gait belt at home for comfort. Family ed regarding home set up as well in prep for DC home soon. Alarm on, call bell in reach.   Session 2: Pt greeted at time of session sitting up in chair agreeable to OT session with encouragement, husband present for beginning of  session and discussed DC home including placement of bed rail he recently purchased. Pt ambulated to bathroom with CGA with RW and transferred to toilet in same manner with use of RW and grab bar, clothing management Mod overall to don/doff over hips but pt able to wipe for urine only. Ambulated within bathroom to shower and transferred to shower bench CGA with RW with extended time. Pt continues to have pain throughout all movement, rest breaks given PRN. UB/LB bathing at shower level with Supervision for UB and Min for LB with pt able to reach BLEs, only needed help with feet and able to lateral weight shift to wash buttocks to prevent standing in shower. Dried off in the same manner and transported via wheelchair to room because of fatigue and time constraints, educated on pacing at home and resting prior to walking back to her room after a shower. SPT to bed with RW with CGA, sit to supine Min to manage LLE. Positioned for comfort, alarm on, call bell in reach.   Therapy Documentation Precautions:  Precautions Precautions: Fall Restrictions Weight Bearing Restrictions: No LLE Weight Bearing: Weight bearing as tolerated     Therapy/Group: Individual Therapy  Viona Gilmore 08/08/2020, 12:54 PM

## 2020-08-08 NOTE — Progress Notes (Signed)
Physical Therapy Session Note  Patient Details  Name: Stacy Valencia MRN: [id number] Date of Birth: November 08, 1933  Today's Date: 08/08/2020 PT Individual Time: 1100-1208 PT Individual Time Calculation (min): 68 min   Short Term Goals: Week 2:  PT Short Term Goal 1 (Week 2): Pt will ambulate 57' with CGA PT Short Term Goal 2 (Week 2): Pt will complete 4 steps with BHRs and minA PT Short Term Goal 3 (Week 2): Pt will perform bed to chair transfer with CGA  Skilled Therapeutic Interventions/Progress Updates:     Pt received supine in bed and eventually agreeable to therapy, after firm encouragement due pt reporting that she had walked to the bathroom earlier and felt like she had done enough for the day. PT educates on rationale for getting out of bed and risks of prolonged bedrest. Pt reports pain in L knee, hip and low back, which she had taken pain meds for 30 minutes prior to session. Husband present for family education.  Pt performs bed mobility with minA and without use of bed features. PT cues on positioning and sequencing. Sit to stand and SPT to [facility] with supervision and cues for hand placement and body mechanics. WC transport to gym for time management.  Pt performs x4 steps with RHR and L HHA with PT providing minA. Pt then performs x4 steps with husband providing assistance and PT cuing husband on safe handling and body mechanics.  Pt ambulates 13' with RW and husband providing CGA. PT cues for positioning and hand placement for caregiver.  Pt performs SPT to car with RW. Husband provides minA to manage BLEs to get into car.  Husband requires seated rest break following transfer due to SOB.  PT educates pt and husband on caregiver burnout/fatigue and importance of safe body mechanics and energy conservation for both pt and husband. Education as well on importance of continued mobility to prevent deconditioning following DC.   Pt left seated in WC with alarm intact and all needs within  reach.  Therapy Documentation Precautions:  Precautions Precautions: Fall Restrictions Weight Bearing Restrictions: No LLE Weight Bearing: Weight bearing as tolerated    Therapy/Group: Individual Therapy  Breck Coons, PT, DPT 08/08/2020, 12:24 PM

## 2020-08-08 NOTE — Discharge Instructions (Signed)
Inpatient Rehab Discharge Instructions  Lelar Farewell Discharge date and time:    Activities/Precautions/ Functional Status: Activity: no lifting, driving, or strenuous exercise for till cleared by MD Diet: regular diet Wound Care: Keep clean and dry. Contact Dr. Lyla Glassing if you develop any problems with your incision/wound--redness, swelling, increase in pain, drainage or if you develop fever or chills.   Functional status:  ___ No restrictions     ___ Walk up steps independently ___ 24/7 supervision/assistance   ___ Walk up steps with assistance ___ Intermittent supervision/assistance  ___ Bathe/dress independently ___ Walk with walker     ___ Bathe/dress with assistance ___ Walk Independently    ___ Shower independently ___ Walk with assistance    ___ Shower with assistance ___ No alcohol     ___ Return to work/school ________  COMMUNITY REFERRALS UPON DISCHARGE:    Home Health:   PT    OT        SNA                    Agency: [facility] [facility]  Phone:[phone] *Please expect follow-up 2-3 days from your discharge date to schedule your home visit. If you have not received follow-up, be sure to contact the branch directly.*   Medical Equipment/Items Ordered: rolling walker                                                 Agency/Supplier: [employer] [facility] [phone]   Special Instructions:    My questions have been answered and I understand these instructions. I will adhere to these goals and the provided educational materials after my discharge from the hospital.  Patient/Caregiver Signature _______________________________ Date __________  Clinician Signature _______________________________________ Date __________  Please bring this form and your medication list with you to all your follow-up doctor's appointments.

## 2020-08-08 NOTE — Progress Notes (Addendum)
Patient ID: Stacy Valencia, female   DOB: 10-Dec-1933, 86 y.o.   MRN: [id number]   SW met with pt and pt husband in room to discuss H[facility] preference. Pt prefers [facility]. SW informed pt husband he was not needed for family edu tomorrow.   SW sent HHPT/OT/aide referral to [facility]. SW waiting on follow-up. *Referral accepted for [facility].   Loralee Pacas, MSW, [place] Office: [phone] Cell: [phone] Fax: [phone]

## 2020-08-08 NOTE — Plan of Care (Signed)
  Problem: Consults Goal: RH GENERAL PATIENT EDUCATION Description: See Patient Education module for education specifics. Outcome: Progressing Goal: Skin Care Protocol Initiated - if Braden Score 18 or less Description: If consults are not indicated, leave blank or document N/A Outcome: Progressing Goal: Nutrition Consult-if indicated Outcome: Progressing   Problem: RH BOWEL ELIMINATION Goal: RH STG MANAGE BOWEL WITH ASSISTANCE Description: STG Manage Bowel with Assistance. Outcome: Progressing Goal: RH STG MANAGE BOWEL W/MEDICATION W/ASSISTANCE Description: STG Manage Bowel with Medication with Assistance. Outcome: Progressing   Problem: RH BLADDER ELIMINATION Goal: RH STG MANAGE BLADDER WITH ASSISTANCE Description: STG Manage Bladder With Moderate Assistance Outcome: Progressing Goal: RH STG MANAGE BLADDER WITH MEDICATION WITH ASSISTANCE Description: STG Manage Bladder With Medication With Assistance. Outcome: Progressing Goal: RH STG MANAGE BLADDER WITH EQUIPMENT WITH ASSISTANCE Description: STG Manage Bladder With Equipment With Assistance Outcome: Progressing

## 2020-08-08 NOTE — Progress Notes (Signed)
[employer] PHYSICAL MEDICINE & REHABILITATION PROGRESS NOTE   Subjective/Complaints:   Pt reports real sore this AM due to working out yesterday- explained this is normal- needs to pee- hasn't gone this AM.   "hurts to move" this AM- due to therapies.   ROS-    Pt denies SOB, abd pain, CP, N/V/C/D, and vision changes   Objective:   No results found. Recent Labs    08/07/20 0706  WBC 7.5  HGB 10.2*  HCT 33.2*  PLT 449*   Recent Labs    08/06/20 0450  NA 134*  K 3.9  CL 104  CO2 23  GLUCOSE 101*  BUN 9  CREATININE 0.88  CALCIUM 8.7*    Intake/Output Summary (Last 24 hours) at 08/08/2020 0753 Last data filed at 08/08/2020 0500 Gross per 24 hour  Intake 462 ml  Output 800 ml  Net -338 ml     Physical Exam: Vital Signs Blood pressure (!) 118/52, pulse 89, temperature 98.2 F (36.8 C), resp. rate 20, height 5\' 6"  (1.676 m), weight 97.3 kg, SpO2 96 %.   General: alert, just woke up- frustrated about soreness, NAD HEENT: conjugate gaze Neck: Supple without JVD or lymphadenopathy Heart: RRR Chest: CTA B/L- no W/R/R- good air movement Abdomen: Soft, NT, ND, (+)BS   Extremities: 1+ edema in left leg- no change Skin: scabbing over some of L hip- staples out- redness almost gone- almost completely scabbed over Neuro: Ox3  Cranial nerves 2-12 are intact. Left lower extremity: Hip flexion 2/5 (pain inhibition), ankle dorsiflexion 4+/5. Left hip with edema and tenderness  Psych: pt frustrated about getting up  Assessment/Plan: 1. Functional deficits secondary to left hip fracture which require 3+ hours per day of interdisciplinary therapy in a comprehensive inpatient rehab setting.  Physiatrist is providing close team supervision and 24 hour management of active medical problems listed below.  Physiatrist and rehab team continue to assess barriers to discharge/monitor patient progress toward functional and medical goals  Care Tool:  Bathing    Body parts  bathed by patient: Right arm, Left arm, Chest, Abdomen, Face, Front perineal area, Buttocks   Body parts bathed by helper: Buttocks     Bathing assist Assist Level: Maximal Assistance - Patient 24 - 49%     Upper Body Dressing/Undressing Upper body dressing   What is the patient wearing?: Pull over shirt    Upper body assist Assist Level: Supervision/Verbal cueing    Lower Body Dressing/Undressing Lower body dressing      What is the patient wearing?: Pants     Lower body assist Assist for lower body dressing: Maximal Assistance - Patient 25 - 49%     Toileting Toileting    Toileting assist Assist for toileting: Maximal Assistance - Patient 25 - 49%     Transfers Chair/bed transfer  Transfers assist     Chair/bed transfer assist level: Contact Guard/Touching assist Chair/bed transfer assistive device: Programmer, multimedia   Ambulation assist      Assist level: Contact Guard/Touching assist Assistive device: Walker-rolling Max distance: 70'   Walk 10 feet activity   Assist  Walk 10 feet activity did not occur: Safety/medical concerns  Assist level: Contact Guard/Touching assist Assistive device: Walker-rolling   Walk 50 feet activity   Assist Walk 50 feet with 2 turns activity did not occur: Safety/medical concerns  Assist level: Contact Guard/Touching assist Assistive device: Walker-rolling    Walk 150 feet activity   Assist Walk 150 feet activity  did not occur: Safety/medical concerns         Walk 10 feet on uneven surface  activity   Assist Walk 10 feet on uneven surfaces activity did not occur: Safety/medical concerns         Wheelchair     Assist Will patient use wheelchair at discharge?: Yes (Per PT long-term goals) Type of Wheelchair: Manual    Wheelchair assist level: Supervision/Verbal cueing Max wheelchair distance: 50    Wheelchair 50 feet with 2 turns activity    Assist        Assist  Level: Supervision/Verbal cueing   Wheelchair 150 feet activity     Assist      Assist Level: Moderate Assistance - Patient 50 - 74%   Blood pressure (!) 118/52, pulse 89, temperature 98.2 F (36.8 C), resp. rate 20, height 5\' 6"  (1.676 m), weight 97.3 kg, SpO2 96 %.  Medical Problem List and Plan: 1.  Deficits with mobility, transfers, self-care secondary to left hip fracture status post IM nail.  8/10- surgery was 8/2- needs staples out 8/16.              8/11- staple area look a little cellulitic-adding Keflex for UTI- will also cover skin bacteria- will monitor   8/12- will remove every OTHER staple- placed order and let nursing know verbally  8/17- staples out- on Doxycycline- looking slightly better             -ELOS/Goals: 15-19 days/min A             Continue CIR 2. H/o PE/ Antithrombotics: -DVT/anticoagulation:  Pharmaceutical: Xarelto             -antiplatelet therapy: N/A 3. Pain Management: Hydrocodone as needed--uncontrolled increase norco to 7.5mg    q 6h 4. Mood: LCSW to follow for evaluation and support  8/10- still having pain issues- said tylenol does better than Norco- will con't tylenol, tramadol, and Norco 1-2 tabs q6 hours prn   8/12- fewer pain complaints today- missed some therapy yesterday due to nap  8/15: Pain worse today with just sitting at EOB. Increased Norco frequency to q4H.  8/17- said pain better this AM  8/18- pain very sore this AM due to therapy- explained has meds to ask for as needed             -antipsychotic agents: N/A 5. Neuropsych: This patient is capable of making decisions on her own behalf. 6. Skin/Wound Care: Monitor wound for healing.  Added protein supplements to promote healing. Increased erythema surrounding left hip staples- contacted Dr. Sid Falcon office and physician or PA to see patient tomorrow. Continue IV antibiotics.   8/17- changed to Doxycycline on Sunday night- redness slightly better-  8/18- redness almost gone-  doing great  7. Fluids/Electrolytes/Nutrition: Monitor I's/O.  Encourage fluid intake.  CMP ordered for tomorrow a.m.  8/11- Cr 0.94- con't to monitor 8. GERD: Continue Protonix with Carafate as needed indigestion  8/9- no complaints- con't regimen 9. ABLA: Added iron supplement.  Hgb 7.4 on 8/5, repeat Monday.   8/9- Hb 8.7-  10.  Tachycardia: Continue to monitor heart rate/BP 3 times daily.  On Lopressor 12.5 mg twice daily. Well controlled             Monitor with increased activity. 11.  Acute renal injury: Encourage fluid intake.               Creatinine much improved to 1.01.   8/9- Cr 0.94  8/13- per pharmacy, renal fxn slightly decreased due to age 33.  Supplemental oxygen dependent             Wean as tolerated 13. Dysuria/UTI  8/10- will check U/A and Cx due to dysuria, frequency and urgency.  8/11- Has large leuks and (+) nitrites and WBC >50- will start Keflex 500 mg BID- is allergic to sulfa.  8/12- >100k GNR- species/sensitivities to follow - Sx's slightly better per pt.    8/13- Citrobacter- UTI- due to combined UTI and cellulitis, change to Ceftriaxone 1 g q24 hours  8/17- changed to Cipro- since off Rocephin- and  UTI Sx's not better on Doxycycline, which it was changed to Sunday nite.    8/18- able to hold urine better this AM.  14. Cellulitis  8/12- change to Rocephin 1 g q24 hours to cover both UTI and cellulitis.  8/16- not significantly better since Friday- pt thinks "she's allergic to staples" but staples are out- looks like cellulitis.   8/17- on Doxycycline- looks a little better  8/18- looks much better   LOS: 14 days A FACE TO FACE EVALUATION WAS PERFORMED  Shira Bobst 08/08/2020, 7:53 AM

## 2020-08-09 ENCOUNTER — Inpatient Hospital Stay (HOSPITAL_COMMUNITY): Payer: Medicare Other | Admitting: Occupational Therapy

## 2020-08-09 ENCOUNTER — Inpatient Hospital Stay (HOSPITAL_COMMUNITY): Payer: Medicare Other | Admitting: Physical Therapy

## 2020-08-09 MED ORDER — OMEPRAZOLE 20 MG PO CPDR: 20.0000 mg | DELAYED_RELEASE_CAPSULE | Freq: Every day | ORAL | 0 refills | Status: DC

## 2020-08-09 MED ORDER — CIPROFLOXACIN HCL 500 MG PO TABS: 500.0000 mg | ORAL_TABLET | Freq: Two times a day (BID) | ORAL | 0 refills | Status: DC

## 2020-08-09 MED ORDER — RIVAROXABAN 20 MG PO TABS: 20.0000 mg | ORAL_TABLET | Freq: Every day | ORAL | 0 refills | Status: DC

## 2020-08-09 MED ORDER — DOXYCYCLINE HYCLATE 100 MG PO TABS: 100.0000 mg | ORAL_TABLET | Freq: Two times a day (BID) | ORAL | 8 refills | Status: DC

## 2020-08-09 MED ORDER — HYDROCODONE-ACETAMINOPHEN 7.5-325 MG PO TABS: 1.0000 | ORAL_TABLET | ORAL | 0 refills | Status: DC | PRN

## 2020-08-09 MED ORDER — METOPROLOL TARTRATE 25 MG PO TABS: 12.5000 mg | ORAL_TABLET | Freq: Two times a day (BID) | ORAL | 0 refills | Status: AC

## 2020-08-09 MED ORDER — ACETAMINOPHEN 325 MG PO TABS: 325.0000 mg | ORAL_TABLET | ORAL | Status: DC | PRN

## 2020-08-09 MED ORDER — FERROUS SULFATE 325 (65 FE) MG PO TABS: 325.0000 mg | ORAL_TABLET | Freq: Every day | ORAL | 3 refills | Status: DC

## 2020-08-09 NOTE — Plan of Care (Signed)
  Problem: Consults Goal: RH GENERAL PATIENT EDUCATION Description: See Patient Education module for education specifics. Outcome: Progressing Goal: Skin Care Protocol Initiated - if Braden Score 18 or less Description: If consults are not indicated, leave blank or document N/A Outcome: Progressing Goal: Nutrition Consult-if indicated Outcome: Progressing   Problem: RH BOWEL ELIMINATION Goal: RH STG MANAGE BOWEL WITH ASSISTANCE Description: STG Manage Bowel with Assistance. Outcome: Progressing Goal: RH STG MANAGE BOWEL W/MEDICATION W/ASSISTANCE Description: STG Manage Bowel with Medication with Assistance. Outcome: Progressing   Problem: RH BLADDER ELIMINATION Goal: RH STG MANAGE BLADDER WITH ASSISTANCE Description: STG Manage Bladder With Moderate Assistance Outcome: Progressing Goal: RH STG MANAGE BLADDER WITH MEDICATION WITH ASSISTANCE Description: STG Manage Bladder With Medication With Assistance. Outcome: Progressing Goal: RH STG MANAGE BLADDER WITH EQUIPMENT WITH ASSISTANCE Description: STG Manage Bladder With Equipment With Assistance Outcome: Progressing   Problem: RH SKIN INTEGRITY Goal: RH STG SKIN FREE OF INFECTION/BREAKDOWN Outcome: Progressing Goal: RH STG MAINTAIN SKIN INTEGRITY WITH ASSISTANCE Description: STG Maintain Skin Integrity With Assistance. Outcome: Progressing Goal: RH STG ABLE TO PERFORM INCISION/WOUND CARE W/ASSISTANCE Description: STG Able To Perform Incision/Wound Care With Assistance. Outcome: Progressing   Problem: RH SAFETY Goal: RH STG ADHERE TO SAFETY PRECAUTIONS W/ASSISTANCE/DEVICE Description: STG Adhere to Safety Precautions With Assistance/Device. Outcome: Progressing Goal: RH STG DECREASED RISK OF FALL WITH ASSISTANCE Description: STG Decreased Risk of Fall With Assistance. Outcome: Progressing   Problem: RH PAIN MANAGEMENT Goal: RH STG PAIN MANAGED AT OR BELOW PT'S PAIN GOAL Outcome: Progressing   Problem: RH KNOWLEDGE  DEFICIT GENERAL Goal: RH STG INCREASE KNOWLEDGE OF SELF CARE AFTER HOSPITALIZATION Outcome: Progressing

## 2020-08-09 NOTE — Plan of Care (Signed)
  Problem: RH Balance Goal: LTG Patient will maintain dynamic sitting balance (PT) Description: LTG:  Patient will maintain dynamic sitting balance with assistance during mobility activities (PT) Outcome: Completed/Met Goal: LTG Patient will maintain dynamic standing balance (PT) Description: LTG:  Patient will maintain dynamic standing balance with assistance during mobility activities (PT) Outcome: Completed/Met   Problem: Sit to Stand Goal: LTG:  Patient will perform sit to stand with assistance level (PT) Description: LTG:  Patient will perform sit to stand with assistance level (PT) Outcome: Completed/Met   Problem: RH Bed Mobility Goal: LTG Patient will perform bed mobility with assist (PT) Description: LTG: Patient will perform bed mobility with assistance, with/without cues (PT). Outcome: Completed/Met   Problem: RH Bed to Chair Transfers Goal: LTG Patient will perform bed/chair transfers w/assist (PT) Description: LTG: Patient will perform bed to chair transfers with assistance (PT). Outcome: Completed/Met   Problem: RH Car Transfers Goal: LTG Patient will perform car transfers with assist (PT) Description: LTG: Patient will perform car transfers with assistance (PT). Outcome: Completed/Met   Problem: RH Ambulation Goal: LTG Patient will ambulate in controlled environment (PT) Description: LTG: Patient will ambulate in a controlled environment, # of feet with assistance (PT). Outcome: Completed/Met Goal: LTG Patient will ambulate in home environment (PT) Description: LTG: Patient will ambulate in home environment, # of feet with assistance (PT). Outcome: Completed/Met   Problem: RH Wheelchair Mobility Goal: LTG Patient will propel w/c in controlled environment (PT) Description: LTG: Patient will propel wheelchair in controlled environment, # of feet with assist (PT) Outcome: Completed/Met Goal: LTG Patient will propel w/c in home environment (PT) Description: LTG:  Patient will propel wheelchair in home environment, # of feet with assistance (PT). Outcome: Completed/Met   Problem: RH Stairs Goal: LTG Patient will ambulate up and down stairs w/assist (PT) Description: LTG: Patient will ambulate up and down # of stairs with assistance (PT) Outcome: Completed/Met   

## 2020-08-09 NOTE — Progress Notes (Signed)
Occupational Therapy Discharge Summary  Patient Details  Name: Stacy Valencia MRN: [id number] Date of Birth: Feb 17, 1933   Patient has met 8 of 10 long term goals due to improved activity tolerance, improved balance, postural control and improved awareness.  Patient to discharge at [facility] Assist level. Pt is inconsistent at times d/t pain level in L hip/knee and husband is aware. Patient's care partner is independent to provide the necessary physical assistance at discharge.  Pt is able to ambulate to/from various rooms including bathroom to access toilet and shower with CS from husband with RW. Min for LB dressing with AE PRN and sock aide for socks. UB bathing Supervision and LB bathing Min A for thoroughness. Pt has been provided HEP as well for UB strengthening.  Reasons goals not met: Pt requires Min-Mod A with toileting tasks including clothing management and hygiene at times for BM, inconsistent skill performance d/t pain and husband is aware of assistance needed/completed training. CGA/CS for TTB transfer and simulated tub/shower combo but pt refused to perform a true TTB in shower/tub combo.   Recommendation:  Patient will benefit from ongoing skilled OT services in home health setting to continue to advance functional skills in the area of BADL and Reduce care partner burden.  Equipment: TTB (husband purchased)  Husband has also ordered hip kit with leg lifter, sock aide, dressing stick, shoe horn, reacher. Also in process of installing grab bars around toilet.   Reasons for discharge: treatment goals met and discharge from hospital  Patient/family agrees with progress made and goals achieved: Yes  OT Discharge Precautions/Restrictions  Precautions Precautions: Fall Restrictions Weight Bearing Restrictions: Yes LLE Weight Bearing: Weight bearing as tolerated Pain Pain Assessment Pain Scale: 0-10 Pain Score: 5  Pain Type: Acute pain Pain Location: Back Pain Orientation:  Left Pain Descriptors / Indicators: Spasm Pain Frequency: Constant Pain Onset: On-going Patients Stated Pain Goal: 2 Pain Intervention(s): Medication (See eMAR) ADL ADL Equipment Provided: Reacher, Sock aid, Long-handled sponge Eating: Set up Grooming: Modified independent (seated at sink) Where Assessed-Grooming: Sitting at sink Upper Body Bathing: Supervision/safety Where Assessed-Upper Body Bathing: Shower Lower Body Bathing: Minimal assistance Where Assessed-Lower Body Bathing: Shower Upper Body Dressing: Setup Where Assessed-Upper Body Dressing: Wheelchair Lower Body Dressing: Minimal assistance Toileting: Moderate assistance Where Assessed-Toileting: Glass blower/designer: Close supervision Toilet Transfer Method: Human resources officer: Close supervison (transfer to TTB, refused to bring legs over edge) Vision Baseline Vision/History: No visual deficits Patient Visual Report: No change from baseline Perception  Perception: Within Functional Limits Praxis Praxis: Intact Cognition Overall Cognitive Status: Within Functional Limits for tasks assessed Arousal/Alertness: Awake/alert Orientation Level: Oriented X4 Safety/Judgment: Appears intact Comments: constant pain, limiting safety and problem solving at times. Sensation Sensation Light Touch: Appears Intact Coordination Gross Motor Movements are Fluid and Coordinated: Yes Fine Motor Movements are Fluid and Coordinated: Yes Motor  Motor Motor: Other (comment) (limited by pain at times) Motor - Discharge Observations: limited by inconsistent pain Mobility  Bed Mobility Bed Mobility: Sit to Supine;Supine to Sit Supine to Sit: Minimal Assistance - Patient > 75% Sit to Supine: Minimal Assistance - Patient > 75% Transfers Sit to Stand: Supervision/Verbal cueing  Trunk/Postural Assessment  Cervical Assessment Cervical Assessment: Within Functional Limits Thoracic Assessment Thoracic Assessment: Within  Functional Limits Lumbar Assessment Lumbar Assessment: Within Functional Limits Postural Control Postural Control: Within Functional Limits  Balance Balance Balance Assessed: Yes Static Sitting Balance Static Sitting - Level of Assistance: 6: Modified independent (Device/Increase time) Dynamic Sitting Balance Dynamic Sitting -  Level of Assistance: 6: Modified independent (Device/Increase time) Dynamic Sitting - Balance Activities: Lateral lean/weight shifting;Forward lean/weight shifting;Reaching across midline;Reaching for objects Static Standing Balance Static Standing - Balance Support: Bilateral upper extremity supported Static Standing - Level of Assistance: 5: Stand by assistance Dynamic Standing Balance Dynamic Standing - Balance Support: Bilateral upper extremity supported (both with BUE supported and unilateral support) Dynamic Standing - Level of Assistance: 5: Stand by assistance Dynamic Standing - Balance Activities: Reaching for objects Dynamic Standing - Comments: clothing management during standing, pain limiting Extremity/Trunk Assessment RUE Assessment RUE Assessment: Within Functional Limits LUE Assessment LUE Assessment: Within Functional Limits   Viona Gilmore 08/09/2020, 9:01 AM

## 2020-08-09 NOTE — Progress Notes (Addendum)
Occupational Therapy Session Note  Patient Details  Name: Stacy Valencia MRN: [id number] Date of Birth: 03/18/1933  Today's Date: 08/09/2020 OT Individual Time: 2725-3664 and 4034-7425 OT Individual Time Calculation (min): 56 min and 60 min Missed 15 mins of OT d/t patient refusal for further treament   Short Term Goals: Week 2:  OT Short Term Goal 1 (Week 2): Pt will complete toilet transfers with Min A OT Short Term Goal 2 (Week 2): Pt will perform LB dressing with AE with Mod A OT Short Term Goal 3 (Week 2): Pt will peform sit <> stand consistently with Min A OT Short Term Goal 4 (Week 2): Pt will improve endurance to perform ADL for 10+ mins with no rest break  Skilled Therapeutic Interventions/Progress Updates:    Pt greeted at time of session agreeable to OT session with encouragement, supine to sit with Min A and use of gait belt as leg lifter to manage LLE. SPT to wheelchair with CGA, Min A to stand from bed with RW and instructions to push up from surface to stand instead of pulling from RW for safety. Set up at sink for oral hygiene and grooming with independent. LB dressing with use of sock aide, pt able to perform with Min A. Doffing gown with compensatory techniques to doff with lateral weight shifting to get out from under buttocks, UB dress set up and LB dress Min A with pt able to thread BLEs (declined reacher - able to forward weight shift with extended time). Static stand at [facility] with pt able to use one hand to assist in donning over hips with Min A to help bring pants back up after falling to the floor d/t loose fit. Therapist did demonstrate with reacher so the pt would be able to use at home if needed, husband has ordered one with a hip kit. Pt declined toileting, did not need to go. Set up in wheelchair with BLEs elevated for comfort, alarm on, call bell in reach.    Session 2: Pt greeted at time of session supine in bed resting stating "I have had enough I cannot do any more."  Extensive education with the pt regarding today being her last day of therapy and need to participate for DC tomorrow. Adamantly refused any seated or OOB activity, including transferring to TTB which had been planned for this afternoon. Agreeable to bed level UB exercises, OT created HEP with plans to use her 3# dumbbells from home. Demonstrated exercises and had pt perform with visual aide. Attempted to raise [facility] to simulate lounge chair/recliner at home but pt insisted to not raise too much d/t discomfort. Pt performed UB HEP with good carryover and understanding of the exercises, remembering some from previous sessions. Extensive education and DC planning with the pt and her husband regarding home modifications and plan for DC home. Reviewed TTB transfer with husband as well as use of leg lifter for comfort, and he does feel good about shower level bathing with the pt. Pt positioned for comfort, alarm on and call bell in reach. Missed 15 minutes of OT session d/t patient refusal of further treatment.   Therapy Documentation Precautions:  Precautions Precautions: Fall Restrictions Weight Bearing Restrictions: Yes LLE Weight Bearing: Weight bearing as tolerated     Therapy/Group: Individual Therapy  Viona Gilmore 08/09/2020, 9:00 AM

## 2020-08-09 NOTE — Progress Notes (Signed)
Physical Therapy Discharge Summary  Patient Details  Name: Stacy Valencia MRN: [id number] Date of Birth: 07/18/1933  Today's Date: 08/09/2020 PT Individual Time: 1020-1115 PT Individual Time Calculation (min): 55 min    Patient has met 11 of 11 long term goals due to improved activity tolerance, improved balance, increased strength, increased range of motion, decreased pain, ability to compensate for deficits, functional use of  left lower extremity and improved coordination.  Patient to discharge at an ambulatory level Supervision.   Patient's care partner is independent to provide the necessary physical assistance at discharge.  Reasons goals not met: All PT goals met   Recommendation:  Patient will benefit from ongoing skilled PT services in home health setting to continue to advance safe functional mobility, address ongoing impairments in balance, gait, transfers, strength, pain, stair management, and minimize fall risk.  Equipment: RW  Reasons for discharge: treatment goals met and discharge from hospital  Patient/family agrees with progress made and goals achieved: Yes  PT treatment:  Pt received sitting in WC and agreeable to PT, but reporting pain and fatigue in the LLE. PT instructed pt in Grad day assessment to measure progress toward goals. See below for details. Gait training with RW x 19ft with supervision assist and and cues for AD management in turns. Stair ascent forward/ descent backwards with min assist and BUE support on rails. Moderate cues forsafety and step to gait pattern with pain and fatigue on last 2 steps. WC mobility x 129ft with supervision assist from PT, multiple rest breaks, but pt able to complete without assist from PT. Car transfer training with min assist and LLE management to access passenger seat. Pt returned to room and performed stand pivot transfer to bed with supervision assist. Sit>supine completed with min assist for LE management, and left supine in  bed with call bell in reach and all needs met.      PT Discharge Precautions/Restrictions   fall  Pain Pain Assessment Pain Scale: 0-10 Pain Score: 8  Pain Type: Acute pain Pain Location: Hip Pain Orientation: Left Pain Descriptors / Indicators: Aching;Discomfort Patients Stated Pain Goal: 2 Pain Intervention(s): Rest Multiple Pain Sites: No Vision/Perception    WFL Cognition Overall Cognitive Status: Within Functional Limits for tasks assessed Orientation Level: Oriented X4 Safety/Judgment: Appears intact Comments: constant pain, limiting safety and problem solving at times. Sensation Sensation Light Touch: Appears Intact Coordination Gross Motor Movements are Fluid and Coordinated: Yes Fine Motor Movements are Fluid and Coordinated: Yes Coordination and Movement Description: continues to limit full ROM on the LLE Heel Shin Test: unable to perform due to pain Motor  Motor Motor: Other (comment) Motor - Discharge Observations: pain and generalized weakness. greatly improved from eval  Mobility Bed Mobility Bed Mobility: Sit to Supine;Supine to Sit Supine to Sit: Minimal Assistance - Patient > 75% Sit to Supine: Minimal Assistance - Patient > 75% Transfers Transfers: Sit to [employer] Transfers Sit to Stand: Supervision/Verbal cueing Stand Pivot Transfers: Supervision/Verbal cueing Stand Pivot Transfer Details: Verbal cues for gait pattern;Verbal cues for safe use of DME/AE;Verbal cues for technique Stand Pivot Transfer Details (indicate cue type and reason): increased time to initiate due to pain. Locomotion  Gait Ambulation: Yes Gait Assistance: Supervision/Verbal cueing Gait Distance (Feet): 50 Feet Assistive device: Rolling walker Gait Gait: Yes Gait Pattern: Impaired Gait Pattern: Antalgic;Step-through pattern Wheelchair Mobility Wheelchair Mobility: Yes Wheelchair Assistance: Chartered loss adjuster: Both upper  extremities Wheelchair Parts Management: Needs assistance Distance: 120ft  Trunk/Postural Assessment  Cervical Assessment Cervical Assessment: Within Functional Limits Thoracic Assessment Thoracic Assessment: Within Functional Limits Lumbar Assessment Lumbar Assessment: Within Functional Limits Postural Control Postural Control: Within Functional Limits  Balance Balance Balance Assessed: Yes Static Sitting Balance Static Sitting - Level of Assistance: 6: Modified independent (Device/Increase time) Dynamic Sitting Balance Dynamic Sitting - Level of Assistance: 6: Modified independent (Device/Increase time) Static Standing Balance Static Standing - Balance Support: Bilateral upper extremity supported Static Standing - Level of Assistance: 5: Stand by assistance Dynamic Standing Balance Dynamic Standing - Balance Support: Bilateral upper extremity supported Dynamic Standing - Level of Assistance: 5: Stand by assistance Dynamic Standing - Comments: gait training and transfers. required incereased time due to pain Extremity Assessment      RLE Assessment RLE Assessment: Within Functional Limits General Strength Comments: grossly 4/5 proximal to distal LLE Assessment LLE Assessment: Exceptions to [facility] General Strength Comments: 4-/5 with pain limiting formal testing    Lorie Phenix 08/09/2020, 11:19 AM

## 2020-08-09 NOTE — Progress Notes (Signed)
Patient ID: Stacy Valencia, female   DOB: 1933-01-16, 86 y.o.   MRN: [id number]   Sw updated pt on [facility]. SW confirms RW delivered.  Loralee Pacas, MSW, [place] Office: [phone] Cell: [phone] Fax: [phone]

## 2020-08-09 NOTE — Progress Notes (Signed)
[place] PHYSICAL MEDICINE & REHABILITATION PROGRESS NOTE   Subjective/Complaints:   Pt reports yesterday took awhile to get meds due to being busy "not complaining", but pain meds didn't work as well.   Ready for d/c tomorrow.   Feeling better this Am otherwise.    ROS-   Pt denies SOB, abd pain, CP, N/V/C/D, and vision changes   Objective:   No results found. Recent Labs    08/07/20 0706  WBC 7.5  HGB 10.2*  HCT 33.2*  PLT 449*   No results for input(s): NA, K, CL, CO2, GLUCOSE, BUN, CREATININE, CALCIUM in the last 72 hours.  Intake/Output Summary (Last 24 hours) at 08/09/2020 0834 Last data filed at 08/09/2020 0809 Gross per 24 hour  Intake 700 ml  Output 550 ml  Net 150 ml     Physical Exam: Vital Signs Blood pressure (!) 120/50, pulse 70, temperature 97.9 F (36.6 C), temperature source Oral, resp. rate 16, height 5\' 6"  (1.676 m), weight 97.3 kg, SpO2 98 %.   General: alert, more appropriate, laying in bed; NAD HEENT: conjugate gaze Neck: Supple without JVD or lymphadenopathy Heart: RRR Chest: CTA B/L- no W/R/R- good air movement Abdomen: Soft, NT, ND, (+)BS   Extremities: 1+ edema in left leg- no change Skin: redness dramatically improved- almost gone- scabbing/peeling of skin notable Neuro: Ox3  Cranial nerves 2-12 are intact. Left lower extremity: Hip flexion 2/5 (pain inhibition), ankle dorsiflexion 4+/5. Left hip with edema and tenderness  Psych: pt more appropriate  Assessment/Plan: 1. Functional deficits secondary to left hip fracture which require 3+ hours per day of interdisciplinary therapy in a comprehensive inpatient rehab setting.  Physiatrist is providing close team supervision and 24 hour management of active medical problems listed below.  Physiatrist and rehab team continue to assess barriers to discharge/monitor patient progress toward functional and medical goals  Care Tool:  Bathing    Body parts bathed by patient: Right  arm, Left arm, Chest, Abdomen, Face, Front perineal area, Buttocks, Right upper leg, Left upper leg, Right lower leg, Left lower leg   Body parts bathed by helper: Right lower leg, Left lower leg     Bathing assist Assist Level: Minimal Assistance - Patient > 75%     Upper Body Dressing/Undressing Upper body dressing   What is the patient wearing?: Dress    Upper body assist Assist Level: Supervision/Verbal cueing    Lower Body Dressing/Undressing Lower body dressing      What is the patient wearing?: Pants     Lower body assist Assist for lower body dressing: Minimal Assistance - Patient > 75% (with reacher)     Toileting Toileting    Toileting assist Assist for toileting: Moderate Assistance - Patient 50 - 74%     Transfers Chair/bed transfer  Transfers assist     Chair/bed transfer assist level: Supervision/Verbal cueing Chair/bed transfer assistive device: Programmer, multimedia   Ambulation assist      Assist level: Contact Guard/Touching assist Assistive device: Walker-rolling Max distance: 85'   Walk 10 feet activity   Assist  Walk 10 feet activity did not occur: Safety/medical concerns  Assist level: Contact Guard/Touching assist Assistive device: Walker-rolling   Walk 50 feet activity   Assist Walk 50 feet with 2 turns activity did not occur: Safety/medical concerns  Assist level: Contact Guard/Touching assist Assistive device: Walker-rolling    Walk 150 feet activity   Assist Walk 150 feet activity did not occur: Safety/medical concerns  Walk 10 feet on uneven surface  activity   Assist Walk 10 feet on uneven surfaces activity did not occur: Safety/medical concerns         Wheelchair     Assist Will patient use wheelchair at discharge?: Yes (Per PT long-term goals) Type of Wheelchair: Manual    Wheelchair assist level: Supervision/Verbal cueing Max wheelchair distance: 50    Wheelchair 50  feet with 2 turns activity    Assist        Assist Level: Supervision/Verbal cueing   Wheelchair 150 feet activity     Assist      Assist Level: Moderate Assistance - Patient 50 - 74%   Blood pressure (!) 120/50, pulse 70, temperature 97.9 F (36.6 C), temperature source Oral, resp. rate 16, height 5\' 6"  (1.676 m), weight 97.3 kg, SpO2 98 %.  Medical Problem List and Plan: 1.  Deficits with mobility, transfers, self-care secondary to left hip fracture status post IM nail.  8/10- surgery was 8/2- needs staples out 8/16.              8/11- staple area look a little cellulitic-adding Keflex for UTI- will also cover skin bacteria- will monitor   8/12- will remove every OTHER staple- placed order and let nursing know verbally  8/17- staples out- on Doxycycline- looking slightly better  8/18- needs to go home on Doxycycline- for a TOTAL of 14 days of ABX             -ELOS/Goals: 15-19 days/min A             Continue CIR 2. H/o PE/ Antithrombotics: -DVT/anticoagulation:  Pharmaceutical: Xarelto             -antiplatelet therapy: N/A 3. Pain Management: Hydrocodone as needed--uncontrolled increase norco to 7.5mg    q 6h 4. Mood: LCSW to follow for evaluation and support  8/18- pain very sore this AM due to therapy- explained has meds to ask for as needed  8/19- still sore, but thinks it's due to having to do therapy, per pt             -antipsychotic agents: N/A 5. Neuropsych: This patient is capable of making decisions on her own behalf. 6. Skin/Wound Care: Monitor wound for healing.  Added protein supplements to promote healing. Increased erythema surrounding left hip staples- contacted Dr. Sid Falcon office and physician or PA to see patient tomorrow. Continue IV antibiotics.   8/17- changed to Doxycycline on Sunday night- redness slightly better-  8/18- redness almost gone- doing great   8/19- looking great 7. Fluids/Electrolytes/Nutrition: Monitor I's/O.  Encourage fluid  intake.  CMP ordered for tomorrow a.m.  8/11- Cr 0.94- con't to monitor 8. GERD: Continue Protonix with Carafate as needed indigestion  8/9- no complaints- con't regimen 9. ABLA: Added iron supplement.  Hgb 7.4 on 8/5, repeat Monday.   8/9- Hb 8.7-  10.  Tachycardia: Continue to monitor heart rate/BP 3 times daily.  On Lopressor 12.5 mg twice daily. Well controlled             Monitor with increased activity. 11.  Acute renal injury: Encourage fluid intake.               Creatinine much improved to 1.01.   8/9- Cr 0.94  8/13- per pharmacy, renal fxn slightly decreased due to age 49.  Supplemental oxygen dependent             Wean as tolerated 13. OVZCHYI/FOY  7/74-  will check U/A and Cx due to dysuria, frequency and urgency.  8/11- Has large leuks and (+) nitrites and WBC >50- will start Keflex 500 mg BID- is allergic to sulfa.  8/12- >100k GNR- species/sensitivities to follow - Sx's slightly better per pt.    8/13- Citrobacter- UTI- due to combined UTI and cellulitis, change to Ceftriaxone 1 g q24 hours  8/17- changed to Cipro- since off Rocephin- and  UTI Sx's not better on Doxycycline, which it was changed to Sunday nite.    8/18- able to hold urine better this AM  8/19- on Cipro- for a total of 5 days- so 3 more days as of today.  14. Cellulitis  8/12- change to Rocephin 1 g q24 hours to cover both UTI and cellulitis.  8/16- not significantly better since Friday- pt thinks "she's allergic to staples" but staples are out- looks like cellulitis.   8/17- on Doxycycline- looks a little better  8/18- looks much better 15. Dispo  8/19- doesn't need to f/u with PM&R clinic - only Ortho and pt's PCP- d/c tomorrow   LOS: 15 days A FACE TO FACE EVALUATION WAS PERFORMED  Garlan Drewes 08/09/2020, 8:34 AM

## 2020-08-09 NOTE — Progress Notes (Signed)
Inpatient Rehabilitation Care Coordinator  Discharge Note  The overall goal for the admission was met for:   Discharge location: Yes. D/c to home with her husband.   Length of Stay: Yes. 15 days.  Discharge activity level: Yes. Min A.   Home/community participation: Yes. Limited.   Services provided included: MD, RD, PT, OT, RN, CM, TR, Pharmacy, Neuropsych and SW  Financial Services: Medicare and Private Insurance: [employer]  Follow-up services arranged: Home Health: [facility] for PT/OT/aide, DME: [employer] for RW and Patient/Family request agency HH: [facility], DME: N/A  Comments (or additional information): contact pt [phone] or husband Lanny Hurst [phone]  Patient/Family verbalized understanding of follow-up arrangements: Yes  Individual responsible for coordination of the follow-up plan: Pt to have assistance with coordinating care needs.   Confirmed correct DME delivered: Rana Snare 08/09/2020    Rana Snare

## 2020-08-10 MED ORDER — METHOCARBAMOL 500 MG PO TABS: 500.0000 mg | ORAL_TABLET | Freq: Four times a day (QID) | ORAL | 0 refills | Status: DC | PRN

## 2020-08-10 NOTE — Progress Notes (Signed)
Stacy Valencia PHYSICAL MEDICINE & REHABILITATION PROGRESS NOTE   Subjective/Complaints:   Pt reports feeling good this AM- having muscle spasms of L hip- using ice to treat- ready for d/c today.    ROS-   Pt denies SOB, abd pain, CP, N/V/C/D, and vision changes   Objective:   No results found. No results for input(s): WBC, HGB, HCT, PLT in the last 72 hours. No results for input(s): NA, K, CL, CO2, GLUCOSE, BUN, CREATININE, CALCIUM in the last 72 hours.  Intake/Output Summary (Last 24 hours) at 08/10/2020 0902 Last data filed at 08/10/2020 0727 Gross per 24 hour  Intake 660 ml  Output 1200 ml  Net -540 ml     Physical Exam: Vital Signs Blood pressure 132/69, pulse 77, temperature 97.8 F (36.6 C), resp. rate 14, height 5\' 6"  (1.676 m), weight 97.3 kg, SpO2 96 %.   General: alert, more appropriate, laying in bed with ice on L hip, NAD HEENT: conjugate gaze Neck: Supple without JVD or lymphadenopathy Heart: RRR Chest: CTA B/L- no W/R/R- good air movement Abdomen: Soft, NT, ND, (+)BS   Extremities: 1+ edema in left leg- slightly improved Skin: rL hip- no redness left- some peeling of skin still Neuro: Ox3  Cranial nerves 2-12 are intact. Left lower extremity: Hip flexion 2/5 (pain inhibition), ankle dorsiflexion 4+/5. Left hip with edema and tenderness  Psych: pt more appropriate  Assessment/Plan: 1. Functional deficits secondary to left hip fracture which require 3+ hours per day of interdisciplinary therapy in a comprehensive inpatient rehab setting.  Physiatrist is providing close team supervision and 24 hour management of active medical problems listed below.  Physiatrist and rehab team continue to assess barriers to discharge/monitor patient progress toward functional and medical goals  Care Tool:  Bathing    Body parts bathed by patient: Right arm, Left arm, Chest, Abdomen, Face, Front perineal area, Buttocks, Right upper leg, Left upper leg, Right lower leg,  Left lower leg   Body parts bathed by helper: Right lower leg, Left lower leg     Bathing assist Assist Level: Minimal Assistance - Patient > 75%     Upper Body Dressing/Undressing Upper body dressing   What is the patient wearing?: Pull over shirt    Upper body assist Assist Level: Set up assist    Lower Body Dressing/Undressing Lower body dressing      What is the patient wearing?: Pants     Lower body assist Assist for lower body dressing: Minimal Assistance - Patient > 75%     Toileting Toileting    Toileting assist Assist for toileting: Moderate Assistance - Patient 50 - 74%     Transfers Chair/bed transfer  Transfers assist     Chair/bed transfer assist level: Supervision/Verbal cueing Chair/bed transfer assistive device: Programmer, multimedia   Ambulation assist      Assist level: Supervision/Verbal cueing Assistive device: Walker-rolling Max distance: 50   Walk 10 feet activity   Assist  Walk 10 feet activity did not occur: Safety/medical concerns  Assist level: Supervision/Verbal cueing Assistive device: Walker-rolling   Walk 50 feet activity   Assist Walk 50 feet with 2 turns activity did not occur: Safety/medical concerns  Assist level: Supervision/Verbal cueing Assistive device: Walker-rolling    Walk 150 feet activity   Assist Walk 150 feet activity did not occur: Refused         Walk 10 feet on uneven surface  activity   Assist Walk 10 feet on uneven  surfaces activity did not occur: Safety/medical concerns   Assist level: Minimal Assistance - Patient > 75%     Wheelchair     Assist Will patient use wheelchair at discharge?: Yes Type of Wheelchair: Manual    Wheelchair assist level: Supervision/Verbal cueing Max wheelchair distance: 150    Wheelchair 50 feet with 2 turns activity    Assist        Assist Level: Supervision/Verbal cueing   Wheelchair 150 feet activity     Assist       Assist Level: Supervision/Verbal cueing   Blood pressure 132/69, pulse 77, temperature 97.8 F (36.6 C), resp. rate 14, height 5\' 6"  (1.676 m), weight 97.3 kg, SpO2 96 %.  Medical Problem List and Plan: 1.  Deficits with mobility, transfers, self-care secondary to left hip fracture status post IM nail.  8/10- surgery was 8/2- needs staples out 8/16.              8/11- staple area look a little cellulitic-adding Keflex for UTI- will also cover skin bacteria- will monitor   8/12- will remove every OTHER staple- placed order and let nursing know verbally  8/17- staples out- on Doxycycline- looking slightly better  8/18- needs to go home on Doxycycline- for a TOTAL of 14 days of ABX             -ELOS/Goals: 15-19 days/min A             Continue CIR 2. H/o PE/ Antithrombotics: -DVT/anticoagulation:  Pharmaceutical: Xarelto             -antiplatelet therapy: N/A 3. Pain Management: Hydrocodone as needed--uncontrolled increase norco to 7.5mg    q 6h 4. Mood: LCSW to follow for evaluation and support  8/18- pain very sore this AM due to therapy- explained has meds to ask for as needed  8/19- still sore, but thinks it's due to having to do therapy, per pt             -antipsychotic agents: N/A 5. Neuropsych: This patient is capable of making decisions on her own behalf. 6. Skin/Wound Care: Monitor wound for healing.  Added protein supplements to promote healing. Increased erythema surrounding left hip staples- contacted Dr. Sid Falcon office and physician or PA to see patient tomorrow. Continue IV antibiotics.   8/17- changed to Doxycycline on Sunday night- redness slightly better-  8/18- redness almost gone- doing great   8/19- looking great 7. Fluids/Electrolytes/Nutrition: Monitor I's/O.  Encourage fluid intake.  CMP ordered for tomorrow a.m.  8/11- Cr 0.94- con't to monitor 8. GERD: Continue Protonix with Carafate as needed indigestion  8/9- no complaints- con't regimen 9. ABLA: Added  iron supplement.  Hgb 7.4 on 8/5, repeat Monday.   8/9- Hb 8.7-  10.  Tachycardia: Continue to monitor heart rate/BP 3 times daily.  On Lopressor 12.5 mg twice daily. Well controlled             Monitor with increased activity. 11.  Acute renal injury: Encourage fluid intake.               Creatinine much improved to 1.01.   8/9- Cr 0.94  8/13- per pharmacy, renal fxn slightly decreased due to age 52.  Supplemental oxygen dependent             Wean as tolerated 13. Dysuria/UTI  8/10- will check U/A and Cx due to dysuria, frequency and urgency.  8/11- Has large leuks and (+) nitrites and WBC >50- will  start Keflex 500 mg BID- is allergic to sulfa.  8/12- >100k GNR- species/sensitivities to follow - Sx's slightly better per pt.    8/13- Citrobacter- UTI- due to combined UTI and cellulitis, change to Ceftriaxone 1 g q24 hours  8/17- changed to Cipro- since off Rocephin- and  UTI Sx's not better on Doxycycline, which it was changed to Sunday nite.    8/18- able to hold urine better this AM  8/19- on Cipro- for a total of 5 days- so 3 more days as of today.  14. Cellulitis  8/12- change to Rocephin 1 g q24 hours to cover both UTI and cellulitis.  8/16- not significantly better since Friday- pt thinks "she's allergic to staples" but staples are out- looks like cellulitis.   8/17- on Doxycycline- looks a little better  8/18- looks much better 15. Dispo  8/19- doesn't need to f/u with PM&R clinic - only Ortho and pt's PCP- d/c tomorrow  8/20- d/c today- go home on Doxycycline and finish Cipro   LOS: 16 days A FACE TO FACE EVALUATION WAS PERFORMED  Katasha Riga 08/10/2020, 9:02 AM

## 2020-08-10 NOTE — Progress Notes (Signed)
Patient discharged to home in family vehicle. Patient escorted to main entrance via wheelchair with belongings with nurse and nurse tech.

## 2020-08-10 NOTE — Plan of Care (Signed)
  Problem: Consults Goal: RH GENERAL PATIENT EDUCATION Description: See Patient Education module for education specifics. Outcome: Completed/Met Goal: Skin Care Protocol Initiated - if Braden Score 18 or less Description: If consults are not indicated, leave blank or document N/A Outcome: Completed/Met Goal: Nutrition Consult-if indicated Outcome: Completed/Met   Problem: RH BOWEL ELIMINATION Goal: RH STG MANAGE BOWEL WITH ASSISTANCE Description: STG Manage Bowel with Assistance. Outcome: Completed/Met Goal: RH STG MANAGE BOWEL W/MEDICATION W/ASSISTANCE Description: STG Manage Bowel with Medication with Assistance. Outcome: Completed/Met   Problem: RH BLADDER ELIMINATION Goal: RH STG MANAGE BLADDER WITH ASSISTANCE Description: STG Manage Bladder With Moderate Assistance Outcome: Completed/Met Goal: RH STG MANAGE BLADDER WITH MEDICATION WITH ASSISTANCE Description: STG Manage Bladder With Medication With Assistance. Outcome: Completed/Met Goal: RH STG MANAGE BLADDER WITH EQUIPMENT WITH ASSISTANCE Description: STG Manage Bladder With Equipment With Assistance Outcome: Completed/Met   Problem: RH SKIN INTEGRITY Goal: RH STG SKIN FREE OF INFECTION/BREAKDOWN Outcome: Completed/Met Goal: RH STG MAINTAIN SKIN INTEGRITY WITH ASSISTANCE Description: STG Maintain Skin Integrity With Assistance. Outcome: Completed/Met Goal: RH STG ABLE TO PERFORM INCISION/WOUND CARE W/ASSISTANCE Description: STG Able To Perform Incision/Wound Care With Assistance. Outcome: Completed/Met   Problem: RH SAFETY Goal: RH STG ADHERE TO SAFETY PRECAUTIONS W/ASSISTANCE/DEVICE Description: STG Adhere to Safety Precautions With Assistance/Device. Outcome: Completed/Met Goal: RH STG DECREASED RISK OF FALL WITH ASSISTANCE Description: STG Decreased Risk of Fall With Assistance. Outcome: Completed/Met   Problem: RH PAIN MANAGEMENT Goal: RH STG PAIN MANAGED AT OR BELOW PT'S PAIN GOAL Outcome: Completed/Met    Problem: RH KNOWLEDGE DEFICIT GENERAL Goal: RH STG INCREASE KNOWLEDGE OF SELF CARE AFTER HOSPITALIZATION Outcome: Completed/Met

## 2020-08-11 ENCOUNTER — Other Ambulatory Visit: Payer: Self-pay

## 2020-08-11 ENCOUNTER — Emergency Department (HOSPITAL_COMMUNITY)
Admission: EM | Admit: 2020-08-11 | Discharge: 2020-08-12 | Disposition: A | Discharge status: Home / Self Care | Payer: Medicare Other | Attending: Emergency Medicine | Admitting: Emergency Medicine

## 2020-08-11 ENCOUNTER — Encounter (HOSPITAL_COMMUNITY): Payer: Self-pay

## 2020-08-11 DIAGNOSIS — S32030A Wedge compression fracture of third lumbar vertebra, initial encounter for closed fracture: Secondary | ICD-10-CM | POA: Diagnosis not present

## 2020-08-11 DIAGNOSIS — M546 Pain in thoracic spine: Secondary | ICD-10-CM | POA: Diagnosis not present

## 2020-08-11 DIAGNOSIS — X58XXXA Exposure to other specified factors, initial encounter: Secondary | ICD-10-CM | POA: Diagnosis not present

## 2020-08-11 DIAGNOSIS — S72112A Displaced fracture of greater trochanter of left femur, initial encounter for closed fracture: Secondary | ICD-10-CM | POA: Diagnosis not present

## 2020-08-11 DIAGNOSIS — F4322 Adjustment disorder with anxiety: Secondary | ICD-10-CM | POA: Diagnosis not present

## 2020-08-11 DIAGNOSIS — Y939 Activity, unspecified: Secondary | ICD-10-CM | POA: Diagnosis not present

## 2020-08-11 DIAGNOSIS — M549 Dorsalgia, unspecified: Secondary | ICD-10-CM | POA: Diagnosis not present

## 2020-08-11 DIAGNOSIS — W19XXXA Unspecified fall, initial encounter: Secondary | ICD-10-CM | POA: Diagnosis not present

## 2020-08-11 DIAGNOSIS — S3992XA Unspecified injury of lower back, initial encounter: Secondary | ICD-10-CM | POA: Diagnosis present

## 2020-08-11 DIAGNOSIS — M5489 Other dorsalgia: Secondary | ICD-10-CM | POA: Diagnosis not present

## 2020-08-11 DIAGNOSIS — S72002A Fracture of unspecified part of neck of left femur, initial encounter for closed fracture: Secondary | ICD-10-CM | POA: Diagnosis not present

## 2020-08-11 DIAGNOSIS — Y929 Unspecified place or not applicable: Secondary | ICD-10-CM | POA: Insufficient documentation

## 2020-08-11 DIAGNOSIS — Y999 Unspecified external cause status: Secondary | ICD-10-CM | POA: Diagnosis not present

## 2020-08-11 DIAGNOSIS — M25552 Pain in left hip: Secondary | ICD-10-CM | POA: Diagnosis not present

## 2020-08-11 DIAGNOSIS — E860 Dehydration: Secondary | ICD-10-CM | POA: Diagnosis not present

## 2020-08-11 DIAGNOSIS — M545 Low back pain: Secondary | ICD-10-CM | POA: Diagnosis not present

## 2020-08-11 DIAGNOSIS — R52 Pain, unspecified: Secondary | ICD-10-CM | POA: Diagnosis not present

## 2020-08-11 NOTE — ED Notes (Signed)
Husband- Cloey Sferrazza- [phone]- would like pt updates

## 2020-08-11 NOTE — ED Notes (Signed)
Pt complaining of hip pain and states she will have her hubby come pick herup. Vitals taken and pt reassured she will be seen if she stays

## 2020-08-11 NOTE — ED Triage Notes (Signed)
Patient complains of back spasms x 2 days, just recently released from rehab yesterday following hip injury. Patient taking pain meds with mnimal relief. Alert and oriented, no new trauma

## 2020-08-11 NOTE — ED Notes (Signed)
Pt complaining of worsening back pain. Pt called daughter to come get her but daughter is unable to. Pt was encouraged to stay and be seen by a provider.

## 2020-08-12 ENCOUNTER — Emergency Department (HOSPITAL_COMMUNITY): Payer: Medicare Other

## 2020-08-12 DIAGNOSIS — M549 Dorsalgia, unspecified: Secondary | ICD-10-CM | POA: Diagnosis not present

## 2020-08-12 DIAGNOSIS — R52 Pain, unspecified: Secondary | ICD-10-CM | POA: Diagnosis not present

## 2020-08-12 DIAGNOSIS — I959 Hypotension, unspecified: Secondary | ICD-10-CM | POA: Diagnosis not present

## 2020-08-12 DIAGNOSIS — S72112A Displaced fracture of greater trochanter of left femur, initial encounter for closed fracture: Secondary | ICD-10-CM | POA: Diagnosis not present

## 2020-08-12 DIAGNOSIS — M545 Low back pain: Secondary | ICD-10-CM | POA: Diagnosis not present

## 2020-08-12 DIAGNOSIS — S32030A Wedge compression fracture of third lumbar vertebra, initial encounter for closed fracture: Secondary | ICD-10-CM | POA: Diagnosis not present

## 2020-08-12 DIAGNOSIS — M5489 Other dorsalgia: Secondary | ICD-10-CM | POA: Diagnosis not present

## 2020-08-12 DIAGNOSIS — Z7401 Bed confinement status: Secondary | ICD-10-CM | POA: Diagnosis not present

## 2020-08-12 DIAGNOSIS — M546 Pain in thoracic spine: Secondary | ICD-10-CM | POA: Diagnosis not present

## 2020-08-12 DIAGNOSIS — M2550 Pain in unspecified joint: Secondary | ICD-10-CM | POA: Diagnosis not present

## 2020-08-12 DIAGNOSIS — S72002A Fracture of unspecified part of neck of left femur, initial encounter for closed fracture: Secondary | ICD-10-CM | POA: Diagnosis not present

## 2020-08-12 MED ORDER — OXYCODONE HCL 5 MG PO TABS
10.0000 mg | ORAL_TABLET | Freq: Once | ORAL | Status: AC
  Administered 2020-08-12: 10 mg via ORAL
  Filled 2020-08-12: qty 2

## 2020-08-12 MED ORDER — OXYCODONE HCL 5 MG PO TABS: 5.0000 mg | ORAL_TABLET | Freq: Four times a day (QID) | ORAL | 0 refills | Status: DC | PRN

## 2020-08-12 NOTE — Discharge Instructions (Addendum)
X-rays today showed that you had an L3 compression fracture.  Given the age of this fracture, it is likely that this occurred on the same day that you fell and broke your left hip.  1. Medications: Continue taking hydrocodone and acetaminophen as prescribed.  You may take Roxicodone every 6 hours in between for breakthrough pain.  Also continue taking Robaxin for muscle spasms.  Continue your usual home medications as directed.  2. Treatment: rest, drink plenty of fluids, take medications as prescribed  3. Follow Up: Please followup with Dr. Lyla Glassing for continued evaluation of your left hip and for management of your pain.  Please return to the ER for worsening pain, numbness, tingling, weakness, fevers or other concerns.

## 2020-08-12 NOTE — ED Notes (Signed)
Discharge instructions discussed with pt. Pt verbalized understanding. Pt stable and nonambulatory discharged via [facility]. No signature pad available

## 2020-08-12 NOTE — ED Notes (Signed)
[facility] called for transport.  

## 2020-08-12 NOTE — ED Provider Notes (Signed)
[facility] EMERGENCY DEPARTMENT Provider Note   CSN: [id number] Arrival date & time: 08/11/20  1200     History No chief complaint on file.   Stacy Valencia is a 86 y.o. female with a hx as listed below presents to the Emergency Department complaining of gradual, persistent, progressively worsening left hip pain and back pain onset 1.5-2 weeks ago.  Patient reports that this happened soon after she was discharged from rehab and she believes it has been getting some worse.  Patient reports she has a constant ache in her back but has severe back spasms when she attempts to move.  Patient reports they begin midline in the mid thoracic region and wrap all the way around her abdomen.  She reports taking Robaxin which has helped her symptoms but has not alleviated them.  Additionally she reports increasing pain in the left hip.  She denies new falls or injuries.  Patient reports pain in the left hip is worse with movement and when attempting to walk.  She is utilizing a walker at home.  Patient does have a history of pulmonary embolism 2 years ago and continues to take Xarelto as directed.  No missed doses.  Additionally she has been taking hydrocodone and additional acetaminophen for pain which has not been helping enough.  Patient denies fever, chills, neck pain, chest pain, shortness of breath, nausea, vomiting, diarrhea, weakness, dizziness, syncope, numbness, tingling.   The history is provided by the patient and medical records. No language interpreter was used.       Past Medical History:  Diagnosis Date  . GERD (gastroesophageal reflux disease)   . PE (pulmonary embolism) 2005    Patient Active Problem List   Diagnosis Date Noted  . Adjustment disorder with anxious mood   . Femur fracture, left ([place]) 07/25/2020  . Supplemental oxygen dependent   . AKI (acute kidney injury) ([place])   . Sinus tachycardia   . Acute blood loss anemia   . Postoperative pain   . Hip  fracture ([place]) 07/22/2020  . History of adverse effect of venous thromboembolism (VTE) prophylaxis 07/22/2020  . DVT (deep venous thrombosis) ([place]) 01/07/2013  . Pulmonary embolism ([place]) 01/05/2013  . GERD (gastroesophageal reflux disease) 01/05/2013    Past Surgical History:  Procedure Laterality Date  . ABDOMINAL HYSTERECTOMY    . APPENDECTOMY    . arthroscopic shoulder surgery Right 10/28/2016   decompression of SAD/DCR  . INTRAMEDULLARY (IM) NAIL INTERTROCHANTERIC Left 07/23/2020   Procedure: INTRAMEDULLARY (IM) NAIL INTERTROCHANTRIC HIP;  Surgeon: Rod Can, MD;  Location: [facility];  Service: Orthopedics;  Laterality: Left;     OB History   No obstetric history on file.     Family History  Problem Relation Age of Onset  . Pulmonary embolism Neg Hx     Social History   Tobacco Use  . Smoking status: Never Smoker  . Smokeless tobacco: Never Used  Substance Use Topics  . Alcohol use: No    Comment: occasionally  . Drug use: No    Home Medications Prior to Admission medications   Medication Sig Start Date End Date Taking? Authorizing Provider  acetaminophen (TYLENOL) 325 MG tablet Take 1-2 tablets (325-650 mg total) by mouth every 4 (four) hours as needed for mild pain. 08/09/20  Yes Angiulli, Lavon Paganini, PA-C  ciprofloxacin (CIPRO) 500 MG tablet Take 1 tablet (500 mg total) by mouth 2 (two) times daily. 08/09/20  Yes Angiulli, Lavon Paganini, PA-C  docusate sodium (COLACE)  100 MG capsule Take 1 capsule (100 mg total) by mouth 2 (two) times daily. 07/25/20  Yes Little Ishikawa, MD  doxycycline (VIBRA-TABS) 100 MG tablet Take 1 tablet (100 mg total) by mouth every 12 (twelve) hours. 08/09/20  Yes Angiulli, Lavon Paganini, PA-C  ferrous sulfate 325 (65 FE) MG tablet Take 1 tablet (325 mg total) by mouth daily with lunch. 08/09/20  Yes Angiulli, Lavon Paganini, PA-C  HYDROcodone-acetaminophen (NORCO) 7.5-325 MG tablet Take 1-2 tablets by mouth every 4 (four) hours as needed for severe pain.  08/09/20  Yes Angiulli, Lavon Paganini, PA-C  methocarbamol (ROBAXIN) 500 MG tablet Take 1 tablet (500 mg total) by mouth every 6 (six) hours as needed for muscle spasms. 08/10/20  Yes Angiulli, Lavon Paganini, PA-C  metoprolol tartrate (LOPRESSOR) 25 MG tablet Take 0.5 tablets (12.5 mg total) by mouth 2 (two) times daily. 08/09/20  Yes Angiulli, Lavon Paganini, PA-C  omeprazole (PRILOSEC) 20 MG capsule Take 1 capsule (20 mg total) by mouth daily. 08/09/20  Yes Angiulli, Lavon Paganini, PA-C  rivaroxaban (XARELTO) 20 MG TABS tablet Take 1 tablet (20 mg total) by mouth daily. 08/09/20  Yes Angiulli, Lavon Paganini, PA-C  oxyCODONE (ROXICODONE) 5 MG immediate release tablet Take 1 tablet (5 mg total) by mouth every 6 (six) hours as needed for breakthrough pain. 08/12/20   Gauge Winski, Jarrett Soho, PA-C    Allergies    Sulfa antibiotics  Review of Systems   Review of Systems  Constitutional: Negative for appetite change, diaphoresis, fatigue, fever and unexpected weight change.  HENT: Negative for mouth sores.   Eyes: Negative for visual disturbance.  Respiratory: Negative for cough, chest tightness, shortness of breath and wheezing.   Cardiovascular: Negative for chest pain.  Gastrointestinal: Negative for abdominal pain, constipation, diarrhea, nausea and vomiting.  Endocrine: Negative for polydipsia, polyphagia and polyuria.  Genitourinary: Negative for dysuria, frequency, hematuria and urgency.  Musculoskeletal: Positive for arthralgias and back pain. Negative for neck stiffness.  Skin: Negative for rash.  Allergic/Immunologic: Negative for immunocompromised state.  Neurological: Negative for syncope, light-headedness and headaches.  Hematological: Does not bruise/bleed easily.  Psychiatric/Behavioral: Negative for sleep disturbance. The patient is not nervous/anxious.     Physical Exam Updated Vital Signs BP (!) 119/53 (BP Location: Right Arm)   Pulse 84   Temp 97.9 F (36.6 C) (Oral)   Resp 18   Ht 5' 5.5" (1.664 m)    Wt 102.1 kg   SpO2 94%   BMI 36.87 kg/m   Physical Exam Vitals and nursing note reviewed.  Constitutional:      General: She is not in acute distress.    Appearance: She is not diaphoretic.  HENT:     Head: Normocephalic.  Eyes:     General: No scleral icterus.    Conjunctiva/sclera: Conjunctivae normal.  Cardiovascular:     Rate and Rhythm: Normal rate and regular rhythm.     Pulses: Normal pulses.          Radial pulses are 2+ on the right side and 2+ on the left side.  Pulmonary:     Effort: No tachypnea, accessory muscle usage, prolonged expiration, respiratory distress or retractions.     Breath sounds: No stridor.     Comments: Equal chest rise. No increased work of breathing. Abdominal:     General: There is no distension.     Palpations: Abdomen is soft.     Tenderness: There is no abdominal tenderness. There is no right CVA tenderness, left CVA  tenderness, guarding or rebound.  Musculoskeletal:     Cervical back: Normal and normal range of motion.     Thoracic back: Normal.     Lumbar back: Tenderness present. Decreased range of motion.       Back:     Comments: Moves all extremities equally and without difficulty. limited range of motion of the left hip with moderate pain.  Skin:    General: Skin is warm and dry.     Capillary Refill: Capillary refill takes less than 2 seconds.       Neurological:     Mental Status: She is alert.     GCS: GCS eye subscore is 4. GCS verbal subscore is 5. GCS motor subscore is 6.     Comments: Speech is clear and goal oriented.  Psychiatric:        Mood and Affect: Mood normal.         ED Results / Procedures / Treatments   Labs (all labs ordered are listed, but only abnormal results are displayed) Labs Reviewed - No data to display  Radiology DG Thoracic Spine 2 View  Result Date: 08/12/2020 CLINICAL DATA:  Back pain EXAM: THORACIC SPINE 2 VIEWS COMPARISON:  None. FINDINGS: There is no evidence of thoracic  spine fracture. Alignment is normal. No other significant bone abnormalities are identified. IMPRESSION: Negative. Electronically Signed   By: Ulyses Jarred M.D.   On: 08/12/2020 03:59   DG Lumbar Spine Complete  Result Date: 08/12/2020 CLINICAL DATA:  Back pain EXAM: LUMBAR SPINE - COMPLETE 4+ VIEW COMPARISON:  None. FINDINGS: There is a compression fracture of L3 with approximately 50% height loss, likely chronic. Otherwise mild degenerative changes. Alignment is normal. IMPRESSION: L3 compression fracture with approximately 50% height loss, favored chronic. Electronically Signed   By: Ulyses Jarred M.D.   On: 08/12/2020 03:58   DG HIP UNILAT W OR W/O PELVIS 2-3 VIEWS LEFT  Result Date: 08/12/2020 CLINICAL DATA:  Back pain.  Recent hip fracture. EXAM: DG HIP (WITH OR WITHOUT PELVIS) 2-3V LEFT COMPARISON:  None. FINDINGS: Left femoral intramedullary nail with interlocking screw. Mildly displaced greater trochanter fracture fragment. No evidence of acute fracture. IMPRESSION: Recent internal fixation of left femoral fracture without adverse features. Electronically Signed   By: Ulyses Jarred M.D.   On: 08/12/2020 03:56    Procedures Procedures (including critical care time)  Medications Ordered in ED Medications  oxyCODONE (Oxy IR/ROXICODONE) immediate release tablet 10 mg (10 mg Oral Given 08/12/20 0530)    ED Course  I have reviewed the triage vital signs and the nursing notes.  Pertinent labs & imaging results that were available during my care of the patient were reviewed by me and considered in my medical decision making (see chart for details).    MDM Rules/Calculators/A&P                           Patient presents with waxing and waning back pain.  Back spasms present when moving.  Additionally with some worsening left hip pain over the same amount of time.  No trauma.  No numbness, tingling or weakness.  Normal neurologic exam.    5:47 AM Patient pain improved after oxycodone  administration.  Long discussion with patient about x-ray results including compression fracture at L3.  I personally evaluated these images.  Patient will need close follow-up with orthopedics and neurosurgery.  She is neurologically intact today.  Patient's left hip  appears to be healing on x-ray.  On exam she does have some mild erythema and induration around the incision site.  Patient has not had any fevers, no drainage from the site.  Patient reports the incision was evaluated by Dr. Lyla Glassing yesterday and they are comfortable with the way that it is healing.  She reports that they suspect allergic reaction to the staples causing the redness.  They are currently not concerned about infection.  Patient has low up with Dr. Lyla Glassing for next week.   Final Clinical Impression(s) / ED Diagnoses Final diagnoses:  Closed compression fracture of L3 lumbar vertebra, initial encounter ([place])  Left hip pain    Rx / DC Orders ED Discharge Orders         Ordered    oxyCODONE (ROXICODONE) 5 MG immediate release tablet  Every 6 hours PRN        08/12/20 0525           Yuli Lanigan, Jarrett Soho, PA-C 08/12/20 0554    Mesner, Corene Cornea, MD 08/12/20 [phone]

## 2020-08-13 DIAGNOSIS — B9689 Other specified bacterial agents as the cause of diseases classified elsewhere: Secondary | ICD-10-CM | POA: Diagnosis present

## 2020-08-13 DIAGNOSIS — N3000 Acute cystitis without hematuria: Secondary | ICD-10-CM | POA: Diagnosis present

## 2020-08-13 DIAGNOSIS — N390 Urinary tract infection, site not specified: Secondary | ICD-10-CM

## 2020-08-13 DIAGNOSIS — K219 Gastro-esophageal reflux disease without esophagitis: Secondary | ICD-10-CM | POA: Diagnosis not present

## 2020-08-13 DIAGNOSIS — T8141XD Infection following a procedure, superficial incisional surgical site, subsequent encounter: Secondary | ICD-10-CM | POA: Diagnosis not present

## 2020-08-13 DIAGNOSIS — Z7901 Long term (current) use of anticoagulants: Secondary | ICD-10-CM | POA: Diagnosis not present

## 2020-08-13 DIAGNOSIS — L03116 Cellulitis of left lower limb: Secondary | ICD-10-CM | POA: Diagnosis not present

## 2020-08-13 DIAGNOSIS — Z87440 Personal history of urinary (tract) infections: Secondary | ICD-10-CM | POA: Diagnosis not present

## 2020-08-13 DIAGNOSIS — Z9181 History of falling: Secondary | ICD-10-CM | POA: Diagnosis not present

## 2020-08-13 DIAGNOSIS — W19XXXD Unspecified fall, subsequent encounter: Secondary | ICD-10-CM | POA: Diagnosis not present

## 2020-08-13 DIAGNOSIS — S72142D Displaced intertrochanteric fracture of left femur, subsequent encounter for closed fracture with routine healing: Secondary | ICD-10-CM | POA: Diagnosis not present

## 2020-08-13 DIAGNOSIS — Z86718 Personal history of other venous thrombosis and embolism: Secondary | ICD-10-CM | POA: Diagnosis not present

## 2020-08-13 DIAGNOSIS — F4322 Adjustment disorder with anxiety: Secondary | ICD-10-CM | POA: Diagnosis not present

## 2020-08-13 DIAGNOSIS — D62 Acute posthemorrhagic anemia: Secondary | ICD-10-CM | POA: Diagnosis not present

## 2020-08-13 DIAGNOSIS — Z79891 Long term (current) use of opiate analgesic: Secondary | ICD-10-CM | POA: Diagnosis not present

## 2020-08-13 DIAGNOSIS — Z86711 Personal history of pulmonary embolism: Secondary | ICD-10-CM | POA: Diagnosis not present

## 2020-08-13 DIAGNOSIS — M4856XD Collapsed vertebra, not elsewhere classified, lumbar region, subsequent encounter for fracture with routine healing: Secondary | ICD-10-CM | POA: Diagnosis not present

## 2020-08-13 NOTE — Discharge Summary (Signed)
Physician Discharge Summary  Patient ID: Stacy Valencia MRN: [id number] DOB/AGE: 04/13/1933 86 y.o.  Admit date: 07/25/2020 Discharge date: 08/10/2020  Discharge Diagnoses:  Principal Problem:   Hip fracture [facility]) Active Problems:   Femur fracture, left (HCC)   Acute blood loss anemia   Postoperative pain   Adjustment disorder with anxious mood   UTI (urinary tract infection)   Discharged Condition: stable   Significant Diagnostic Studies: N/A   Labs:  Basic Metabolic Panel: BMP Latest Ref Rng & Units 08/06/2020 07/30/2020 07/26/2020  Glucose 70 - 99 mg/dL 101(H) 104(H) 115(H)  BUN 8 - 23 mg/dL 9 14 23   Creatinine 0.44 - 1.00 mg/dL 0.88 0.94 1.01(H)  Sodium 135 - 145 mmol/L 134(L) 135 133(L)  Potassium 3.5 - 5.1 mmol/L 3.9 3.7 4.1  Chloride 98 - 111 mmol/L 104 102 101  CO2 22 - 32 mmol/L 23 27 26   Calcium 8.9 - 10.3 mg/dL 8.7(L) 8.3(L) 8.0(L)    CBC: CBC Latest Ref Rng & Units 08/07/2020 07/30/2020 07/26/2020  WBC 4.0 - 10.5 K/uL 7.5 9.3 8.5  Hemoglobin 12.0 - 15.0 g/dL 10.2(L) 8.7(L) 7.4(L)  Hematocrit 36 - 46 % 33.2(L) 27.5(L) 23.0(L)  Platelets 150 - 400 K/uL 449(H) 301 184    CBG: No results for input(s): GLUCAP in the last 168 hours.   Brief HPI:   Stacy Valencia is a 86 y.o. female with history of PE/DVT- on chronic AC, GERD who was admitted on 07/22/20 after fall with onset of hip pain. She was found to have mildly displaced angulated IT proximal femur fracture and underwent IM nail fixation on 08/02 by Dr. Lyla Glassing. Post op course significant for hypoxia with confusion felt to be due to IV narcotics. She required supplemental oxygen which was being weaned down and HR controlled with use of BB. Follow up labs show ABLA, hyponatremia as well as issues with ARF. Therapy ongoing and working on pregait activity in [employer] lift. CIR recommended due to functional decline.    Hospital Course: Stacy Valencia was admitted to rehab 07/25/2020 for inpatient therapies to consist of PT and OT at  least three hours five days a week. Past admission physiatrist, therapy team and rehab RN have worked together to provide customized collaborative inpatient rehab. She developed staple irritation and was started on keflex dude to concerns of cellulitis. She also reported frequency with dysuria and was found to have citrobacter UTI which was resistant to Keflex therefore antibiotics changed to IV ceftriaxone. Ortho consulted for input and to evaluate patient due to worsening of erythema. Ortho PA removed staples and added doxycycline X 10 days for cellulitis due to staple irritation.  Ceftriaxone was transitioned to Cipro and she is to continue this for 3 more days for 5 days of therapy as well as doxycycline for 9 more days.   ARF has resolved with increase in fluid intake. Respiratory status and endurance levels have improved and she was weaned off oxygen. Her blood pressures and heart rate were monitored on TID basis and have been controlled on low dose BB. Follow up CBC showed that ABLA is resolving. Serial check of lytes showed that hyponatremia has greatly improved and renal status is WNL.  Pain is controlled with judicious use of hydrocodone alternating with tramadol as well as ice prn for muscle spasms. She has made slow but steady gains during her rehab stay and currently fluctuates from supervision to min assist depending on pain and endurance issues. She will continue to receive follow up HHPT,  HHOT and HH Aide by [facility] after discharge.    Rehab course: During patient's stay in rehab weekly team conferences were held to monitor patient's progress, set goals and discuss barriers to discharge. At admission, patient required max assist with ADL tasks and with mobility. She  has had improvement in activity tolerance, balance, postural control as well as ability to compensate for deficits. She requires supervision for UB bathing and min assist with all other ADL tasks. She requires min to mod  assist with toileting tasks. The amount of assistance required fluctuates depending on pain levels. she requires min assist with bed mobility. She requires supervision with cues for transfers and to ambulate 61' with RW and cues for safety. Family education was completed regarding all aspects of care and safety.   Discharge disposition: 01-Home or Self Care  Diet: regular  Special Instructions: 1. Keep incision clean and dry. Need to see Dr. Lyla Glassing next week.   Allergies as of 08/10/2020      Reactions   Sulfa Antibiotics Nausea And Vomiting      Medication List    STOP taking these medications   alendronate 70 MG tablet Commonly known as: FOSAMAX   HYDROcodone-acetaminophen 5-325 MG tablet Commonly known as: NORCO/VICODIN Replaced by: HYDROcodone-acetaminophen 7.5-325 MG tablet     TAKE these medications   acetaminophen 325 MG tablet Commonly known as: TYLENOL Take 1-2 tablets (325-650 mg total) by mouth every 4 (four) hours as needed for mild pain.   ciprofloxacin 500 MG tablet Commonly known as: CIPRO Take 1 tablet (500 mg total) by mouth 2 (two) times daily.   docusate sodium 100 MG capsule Commonly known as: COLACE Take 1 capsule (100 mg total) by mouth 2 (two) times daily.   doxycycline 100 MG tablet Commonly known as: VIBRA-TABS Take 1 tablet (100 mg total) by mouth every 12 (twelve) hours.   ferrous sulfate 325 (65 FE) MG tablet Take 1 tablet (325 mg total) by mouth daily with lunch.   HYDROcodone-acetaminophen 7.5-325 MG tablet--Rx# 30 pills Commonly known as: NORCO Take 1-2 tablets by mouth every 4 (four) hours as needed for severe pain. Replaces: HYDROcodone-acetaminophen 5-325 MG tablet   methocarbamol 500 MG tablet Commonly known as: Robaxin Take 1 tablet (500 mg total) by mouth every 6 (six) hours as needed for muscle spasms.   metoprolol tartrate 25 MG tablet Commonly known as: LOPRESSOR Take 0.5 tablets (12.5 mg total) by mouth 2 (two) times  daily.   omeprazole 20 MG capsule Commonly known as: PRILOSEC Take 1 capsule (20 mg total) by mouth daily.   rivaroxaban 20 MG Tabs tablet Commonly known as: Xarelto Take 1 tablet (20 mg total) by mouth daily. What changed: how much to take       Follow-up Information    Lovorn, Jinny Blossom, MD Follow up.   Specialty: Physical Medicine and Rehabilitation Why: no follow up needed Contact information: [address] [place] [address] [phone]        Rod Can, MD. Call.   Specialty: Orthopedic Surgery Why: for post op check.  Contact information: [address] [place] [address] [phone]        [name], MD. Call in 1 day(s).   Specialty: Specialist Why: for post hospital follow up Contact information: [place] [address] [phone]               Signed: Bary Leriche 08/14/2020, 10:05 AM

## 2020-08-15 DIAGNOSIS — T8141XD Infection following a procedure, superficial incisional surgical site, subsequent encounter: Secondary | ICD-10-CM | POA: Diagnosis not present

## 2020-08-15 DIAGNOSIS — S72142D Displaced intertrochanteric fracture of left femur, subsequent encounter for closed fracture with routine healing: Secondary | ICD-10-CM | POA: Diagnosis not present

## 2020-08-15 DIAGNOSIS — N390 Urinary tract infection, site not specified: Secondary | ICD-10-CM | POA: Diagnosis not present

## 2020-08-15 DIAGNOSIS — M4856XD Collapsed vertebra, not elsewhere classified, lumbar region, subsequent encounter for fracture with routine healing: Secondary | ICD-10-CM | POA: Diagnosis not present

## 2020-08-15 DIAGNOSIS — L03116 Cellulitis of left lower limb: Secondary | ICD-10-CM | POA: Diagnosis not present

## 2020-08-15 DIAGNOSIS — B9689 Other specified bacterial agents as the cause of diseases classified elsewhere: Secondary | ICD-10-CM | POA: Diagnosis not present

## 2020-08-16 DIAGNOSIS — B9689 Other specified bacterial agents as the cause of diseases classified elsewhere: Secondary | ICD-10-CM | POA: Diagnosis not present

## 2020-08-16 DIAGNOSIS — N390 Urinary tract infection, site not specified: Secondary | ICD-10-CM | POA: Diagnosis not present

## 2020-08-16 DIAGNOSIS — S72142D Displaced intertrochanteric fracture of left femur, subsequent encounter for closed fracture with routine healing: Secondary | ICD-10-CM | POA: Diagnosis not present

## 2020-08-16 DIAGNOSIS — M4856XD Collapsed vertebra, not elsewhere classified, lumbar region, subsequent encounter for fracture with routine healing: Secondary | ICD-10-CM | POA: Diagnosis not present

## 2020-08-16 DIAGNOSIS — L03116 Cellulitis of left lower limb: Secondary | ICD-10-CM | POA: Diagnosis not present

## 2020-08-16 DIAGNOSIS — T8141XD Infection following a procedure, superficial incisional surgical site, subsequent encounter: Secondary | ICD-10-CM | POA: Diagnosis not present

## 2020-08-17 DIAGNOSIS — Z4789 Encounter for other orthopedic aftercare: Secondary | ICD-10-CM | POA: Diagnosis not present

## 2020-08-17 DIAGNOSIS — M545 Low back pain: Secondary | ICD-10-CM | POA: Diagnosis not present

## 2020-08-17 DIAGNOSIS — S32030A Wedge compression fracture of third lumbar vertebra, initial encounter for closed fracture: Secondary | ICD-10-CM | POA: Diagnosis not present

## 2020-08-17 DIAGNOSIS — S72142A Displaced intertrochanteric fracture of left femur, initial encounter for closed fracture: Secondary | ICD-10-CM | POA: Diagnosis not present

## 2020-08-19 DIAGNOSIS — M4856XD Collapsed vertebra, not elsewhere classified, lumbar region, subsequent encounter for fracture with routine healing: Secondary | ICD-10-CM | POA: Diagnosis not present

## 2020-08-19 DIAGNOSIS — N390 Urinary tract infection, site not specified: Secondary | ICD-10-CM | POA: Diagnosis not present

## 2020-08-19 DIAGNOSIS — L03116 Cellulitis of left lower limb: Secondary | ICD-10-CM | POA: Diagnosis not present

## 2020-08-19 DIAGNOSIS — S72142D Displaced intertrochanteric fracture of left femur, subsequent encounter for closed fracture with routine healing: Secondary | ICD-10-CM | POA: Diagnosis not present

## 2020-08-19 DIAGNOSIS — T8141XD Infection following a procedure, superficial incisional surgical site, subsequent encounter: Secondary | ICD-10-CM | POA: Diagnosis not present

## 2020-08-19 DIAGNOSIS — B9689 Other specified bacterial agents as the cause of diseases classified elsewhere: Secondary | ICD-10-CM | POA: Diagnosis not present

## 2020-08-20 DIAGNOSIS — M4856XD Collapsed vertebra, not elsewhere classified, lumbar region, subsequent encounter for fracture with routine healing: Secondary | ICD-10-CM | POA: Diagnosis not present

## 2020-08-20 DIAGNOSIS — L03116 Cellulitis of left lower limb: Secondary | ICD-10-CM | POA: Diagnosis not present

## 2020-08-20 DIAGNOSIS — S72142D Displaced intertrochanteric fracture of left femur, subsequent encounter for closed fracture with routine healing: Secondary | ICD-10-CM | POA: Diagnosis not present

## 2020-08-20 DIAGNOSIS — N390 Urinary tract infection, site not specified: Secondary | ICD-10-CM | POA: Diagnosis not present

## 2020-08-20 DIAGNOSIS — T8141XD Infection following a procedure, superficial incisional surgical site, subsequent encounter: Secondary | ICD-10-CM | POA: Diagnosis not present

## 2020-08-20 DIAGNOSIS — B9689 Other specified bacterial agents as the cause of diseases classified elsewhere: Secondary | ICD-10-CM | POA: Diagnosis not present

## 2020-08-21 DIAGNOSIS — T8141XD Infection following a procedure, superficial incisional surgical site, subsequent encounter: Secondary | ICD-10-CM | POA: Diagnosis not present

## 2020-08-21 DIAGNOSIS — B9689 Other specified bacterial agents as the cause of diseases classified elsewhere: Secondary | ICD-10-CM | POA: Diagnosis not present

## 2020-08-21 DIAGNOSIS — M4856XD Collapsed vertebra, not elsewhere classified, lumbar region, subsequent encounter for fracture with routine healing: Secondary | ICD-10-CM | POA: Diagnosis not present

## 2020-08-21 DIAGNOSIS — S72142D Displaced intertrochanteric fracture of left femur, subsequent encounter for closed fracture with routine healing: Secondary | ICD-10-CM | POA: Diagnosis not present

## 2020-08-21 DIAGNOSIS — N390 Urinary tract infection, site not specified: Secondary | ICD-10-CM | POA: Diagnosis not present

## 2020-08-21 DIAGNOSIS — L03116 Cellulitis of left lower limb: Secondary | ICD-10-CM | POA: Diagnosis not present

## 2020-08-22 DIAGNOSIS — M4856XD Collapsed vertebra, not elsewhere classified, lumbar region, subsequent encounter for fracture with routine healing: Secondary | ICD-10-CM | POA: Diagnosis not present

## 2020-08-22 DIAGNOSIS — S72142D Displaced intertrochanteric fracture of left femur, subsequent encounter for closed fracture with routine healing: Secondary | ICD-10-CM | POA: Diagnosis not present

## 2020-08-22 DIAGNOSIS — N390 Urinary tract infection, site not specified: Secondary | ICD-10-CM | POA: Diagnosis not present

## 2020-08-22 DIAGNOSIS — L03116 Cellulitis of left lower limb: Secondary | ICD-10-CM | POA: Diagnosis not present

## 2020-08-22 DIAGNOSIS — B9689 Other specified bacterial agents as the cause of diseases classified elsewhere: Secondary | ICD-10-CM | POA: Diagnosis not present

## 2020-08-22 DIAGNOSIS — T8141XD Infection following a procedure, superficial incisional surgical site, subsequent encounter: Secondary | ICD-10-CM | POA: Diagnosis not present

## 2020-08-23 DIAGNOSIS — Z09 Encounter for follow-up examination after completed treatment for conditions other than malignant neoplasm: Secondary | ICD-10-CM | POA: Diagnosis not present

## 2020-08-23 DIAGNOSIS — L03116 Cellulitis of left lower limb: Secondary | ICD-10-CM | POA: Diagnosis not present

## 2020-08-23 DIAGNOSIS — M4856XD Collapsed vertebra, not elsewhere classified, lumbar region, subsequent encounter for fracture with routine healing: Secondary | ICD-10-CM | POA: Diagnosis not present

## 2020-08-23 DIAGNOSIS — Z86711 Personal history of pulmonary embolism: Secondary | ICD-10-CM | POA: Diagnosis not present

## 2020-08-23 DIAGNOSIS — B9689 Other specified bacterial agents as the cause of diseases classified elsewhere: Secondary | ICD-10-CM | POA: Diagnosis not present

## 2020-08-23 DIAGNOSIS — M549 Dorsalgia, unspecified: Secondary | ICD-10-CM | POA: Diagnosis not present

## 2020-08-23 DIAGNOSIS — S72002D Fracture of unspecified part of neck of left femur, subsequent encounter for closed fracture with routine healing: Secondary | ICD-10-CM | POA: Diagnosis not present

## 2020-08-23 DIAGNOSIS — N390 Urinary tract infection, site not specified: Secondary | ICD-10-CM | POA: Diagnosis not present

## 2020-08-23 DIAGNOSIS — S32039D Unspecified fracture of third lumbar vertebra, subsequent encounter for fracture with routine healing: Secondary | ICD-10-CM | POA: Diagnosis not present

## 2020-08-23 DIAGNOSIS — S72142D Displaced intertrochanteric fracture of left femur, subsequent encounter for closed fracture with routine healing: Secondary | ICD-10-CM | POA: Diagnosis not present

## 2020-08-23 DIAGNOSIS — T8141XD Infection following a procedure, superficial incisional surgical site, subsequent encounter: Secondary | ICD-10-CM | POA: Diagnosis not present

## 2020-08-23 DIAGNOSIS — R110 Nausea: Secondary | ICD-10-CM | POA: Diagnosis not present

## 2020-08-24 DIAGNOSIS — S72142D Displaced intertrochanteric fracture of left femur, subsequent encounter for closed fracture with routine healing: Secondary | ICD-10-CM | POA: Diagnosis not present

## 2020-08-24 DIAGNOSIS — B9689 Other specified bacterial agents as the cause of diseases classified elsewhere: Secondary | ICD-10-CM | POA: Diagnosis not present

## 2020-08-24 DIAGNOSIS — L03116 Cellulitis of left lower limb: Secondary | ICD-10-CM | POA: Diagnosis not present

## 2020-08-24 DIAGNOSIS — M4856XD Collapsed vertebra, not elsewhere classified, lumbar region, subsequent encounter for fracture with routine healing: Secondary | ICD-10-CM | POA: Diagnosis not present

## 2020-08-24 DIAGNOSIS — N390 Urinary tract infection, site not specified: Secondary | ICD-10-CM | POA: Diagnosis not present

## 2020-08-24 DIAGNOSIS — T8141XD Infection following a procedure, superficial incisional surgical site, subsequent encounter: Secondary | ICD-10-CM | POA: Diagnosis not present

## 2020-08-28 DIAGNOSIS — B9689 Other specified bacterial agents as the cause of diseases classified elsewhere: Secondary | ICD-10-CM | POA: Diagnosis not present

## 2020-08-28 DIAGNOSIS — S72142D Displaced intertrochanteric fracture of left femur, subsequent encounter for closed fracture with routine healing: Secondary | ICD-10-CM | POA: Diagnosis not present

## 2020-08-28 DIAGNOSIS — N390 Urinary tract infection, site not specified: Secondary | ICD-10-CM | POA: Diagnosis not present

## 2020-08-28 DIAGNOSIS — M4856XD Collapsed vertebra, not elsewhere classified, lumbar region, subsequent encounter for fracture with routine healing: Secondary | ICD-10-CM | POA: Diagnosis not present

## 2020-08-28 DIAGNOSIS — L03116 Cellulitis of left lower limb: Secondary | ICD-10-CM | POA: Diagnosis not present

## 2020-08-28 DIAGNOSIS — T8141XD Infection following a procedure, superficial incisional surgical site, subsequent encounter: Secondary | ICD-10-CM | POA: Diagnosis not present

## 2020-08-29 DIAGNOSIS — L03116 Cellulitis of left lower limb: Secondary | ICD-10-CM | POA: Diagnosis not present

## 2020-08-29 DIAGNOSIS — M4856XD Collapsed vertebra, not elsewhere classified, lumbar region, subsequent encounter for fracture with routine healing: Secondary | ICD-10-CM | POA: Diagnosis not present

## 2020-08-29 DIAGNOSIS — B9689 Other specified bacterial agents as the cause of diseases classified elsewhere: Secondary | ICD-10-CM | POA: Diagnosis not present

## 2020-08-29 DIAGNOSIS — T8141XD Infection following a procedure, superficial incisional surgical site, subsequent encounter: Secondary | ICD-10-CM | POA: Diagnosis not present

## 2020-08-29 DIAGNOSIS — S72142D Displaced intertrochanteric fracture of left femur, subsequent encounter for closed fracture with routine healing: Secondary | ICD-10-CM | POA: Diagnosis not present

## 2020-08-29 DIAGNOSIS — N390 Urinary tract infection, site not specified: Secondary | ICD-10-CM | POA: Diagnosis not present

## 2020-08-30 DIAGNOSIS — M4856XD Collapsed vertebra, not elsewhere classified, lumbar region, subsequent encounter for fracture with routine healing: Secondary | ICD-10-CM | POA: Diagnosis not present

## 2020-08-30 DIAGNOSIS — L03116 Cellulitis of left lower limb: Secondary | ICD-10-CM | POA: Diagnosis not present

## 2020-08-30 DIAGNOSIS — T8141XD Infection following a procedure, superficial incisional surgical site, subsequent encounter: Secondary | ICD-10-CM | POA: Diagnosis not present

## 2020-08-30 DIAGNOSIS — S72142D Displaced intertrochanteric fracture of left femur, subsequent encounter for closed fracture with routine healing: Secondary | ICD-10-CM | POA: Diagnosis not present

## 2020-08-30 DIAGNOSIS — B9689 Other specified bacterial agents as the cause of diseases classified elsewhere: Secondary | ICD-10-CM | POA: Diagnosis not present

## 2020-08-30 DIAGNOSIS — N390 Urinary tract infection, site not specified: Secondary | ICD-10-CM | POA: Diagnosis not present

## 2020-08-31 DIAGNOSIS — S72142D Displaced intertrochanteric fracture of left femur, subsequent encounter for closed fracture with routine healing: Secondary | ICD-10-CM | POA: Diagnosis not present

## 2020-08-31 DIAGNOSIS — T8141XD Infection following a procedure, superficial incisional surgical site, subsequent encounter: Secondary | ICD-10-CM | POA: Diagnosis not present

## 2020-08-31 DIAGNOSIS — L03116 Cellulitis of left lower limb: Secondary | ICD-10-CM | POA: Diagnosis not present

## 2020-08-31 DIAGNOSIS — N390 Urinary tract infection, site not specified: Secondary | ICD-10-CM | POA: Diagnosis not present

## 2020-08-31 DIAGNOSIS — M4856XD Collapsed vertebra, not elsewhere classified, lumbar region, subsequent encounter for fracture with routine healing: Secondary | ICD-10-CM | POA: Diagnosis not present

## 2020-08-31 DIAGNOSIS — B9689 Other specified bacterial agents as the cause of diseases classified elsewhere: Secondary | ICD-10-CM | POA: Diagnosis not present

## 2020-09-03 DIAGNOSIS — T8141XD Infection following a procedure, superficial incisional surgical site, subsequent encounter: Secondary | ICD-10-CM | POA: Diagnosis not present

## 2020-09-03 DIAGNOSIS — M4856XD Collapsed vertebra, not elsewhere classified, lumbar region, subsequent encounter for fracture with routine healing: Secondary | ICD-10-CM | POA: Diagnosis not present

## 2020-09-03 DIAGNOSIS — N390 Urinary tract infection, site not specified: Secondary | ICD-10-CM | POA: Diagnosis not present

## 2020-09-03 DIAGNOSIS — B9689 Other specified bacterial agents as the cause of diseases classified elsewhere: Secondary | ICD-10-CM | POA: Diagnosis not present

## 2020-09-03 DIAGNOSIS — S72142D Displaced intertrochanteric fracture of left femur, subsequent encounter for closed fracture with routine healing: Secondary | ICD-10-CM | POA: Diagnosis not present

## 2020-09-03 DIAGNOSIS — L03116 Cellulitis of left lower limb: Secondary | ICD-10-CM | POA: Diagnosis not present

## 2020-09-04 DIAGNOSIS — T8141XD Infection following a procedure, superficial incisional surgical site, subsequent encounter: Secondary | ICD-10-CM | POA: Diagnosis not present

## 2020-09-04 DIAGNOSIS — B9689 Other specified bacterial agents as the cause of diseases classified elsewhere: Secondary | ICD-10-CM | POA: Diagnosis not present

## 2020-09-04 DIAGNOSIS — M4856XD Collapsed vertebra, not elsewhere classified, lumbar region, subsequent encounter for fracture with routine healing: Secondary | ICD-10-CM | POA: Diagnosis not present

## 2020-09-04 DIAGNOSIS — L03116 Cellulitis of left lower limb: Secondary | ICD-10-CM | POA: Diagnosis not present

## 2020-09-04 DIAGNOSIS — N390 Urinary tract infection, site not specified: Secondary | ICD-10-CM | POA: Diagnosis not present

## 2020-09-04 DIAGNOSIS — S72142D Displaced intertrochanteric fracture of left femur, subsequent encounter for closed fracture with routine healing: Secondary | ICD-10-CM | POA: Diagnosis not present

## 2020-09-05 DIAGNOSIS — B9689 Other specified bacterial agents as the cause of diseases classified elsewhere: Secondary | ICD-10-CM | POA: Diagnosis not present

## 2020-09-05 DIAGNOSIS — T8141XD Infection following a procedure, superficial incisional surgical site, subsequent encounter: Secondary | ICD-10-CM | POA: Diagnosis not present

## 2020-09-05 DIAGNOSIS — L03116 Cellulitis of left lower limb: Secondary | ICD-10-CM | POA: Diagnosis not present

## 2020-09-05 DIAGNOSIS — S72142D Displaced intertrochanteric fracture of left femur, subsequent encounter for closed fracture with routine healing: Secondary | ICD-10-CM | POA: Diagnosis not present

## 2020-09-05 DIAGNOSIS — N390 Urinary tract infection, site not specified: Secondary | ICD-10-CM | POA: Diagnosis not present

## 2020-09-05 DIAGNOSIS — M4856XD Collapsed vertebra, not elsewhere classified, lumbar region, subsequent encounter for fracture with routine healing: Secondary | ICD-10-CM | POA: Diagnosis not present

## 2020-09-06 DIAGNOSIS — B9689 Other specified bacterial agents as the cause of diseases classified elsewhere: Secondary | ICD-10-CM | POA: Diagnosis not present

## 2020-09-06 DIAGNOSIS — K5900 Constipation, unspecified: Secondary | ICD-10-CM | POA: Diagnosis not present

## 2020-09-06 DIAGNOSIS — M4856XD Collapsed vertebra, not elsewhere classified, lumbar region, subsequent encounter for fracture with routine healing: Secondary | ICD-10-CM | POA: Diagnosis not present

## 2020-09-06 DIAGNOSIS — L03116 Cellulitis of left lower limb: Secondary | ICD-10-CM | POA: Diagnosis not present

## 2020-09-06 DIAGNOSIS — N390 Urinary tract infection, site not specified: Secondary | ICD-10-CM | POA: Diagnosis not present

## 2020-09-06 DIAGNOSIS — S72142D Displaced intertrochanteric fracture of left femur, subsequent encounter for closed fracture with routine healing: Secondary | ICD-10-CM | POA: Diagnosis not present

## 2020-09-06 DIAGNOSIS — T8141XD Infection following a procedure, superficial incisional surgical site, subsequent encounter: Secondary | ICD-10-CM | POA: Diagnosis not present

## 2020-09-06 DIAGNOSIS — R1110 Vomiting, unspecified: Secondary | ICD-10-CM | POA: Diagnosis not present

## 2020-09-10 DIAGNOSIS — S72142D Displaced intertrochanteric fracture of left femur, subsequent encounter for closed fracture with routine healing: Secondary | ICD-10-CM | POA: Diagnosis not present

## 2020-09-10 DIAGNOSIS — L03116 Cellulitis of left lower limb: Secondary | ICD-10-CM | POA: Diagnosis not present

## 2020-09-10 DIAGNOSIS — B9689 Other specified bacterial agents as the cause of diseases classified elsewhere: Secondary | ICD-10-CM | POA: Diagnosis not present

## 2020-09-10 DIAGNOSIS — N390 Urinary tract infection, site not specified: Secondary | ICD-10-CM | POA: Diagnosis not present

## 2020-09-10 DIAGNOSIS — T8141XD Infection following a procedure, superficial incisional surgical site, subsequent encounter: Secondary | ICD-10-CM | POA: Diagnosis not present

## 2020-09-10 DIAGNOSIS — M4856XD Collapsed vertebra, not elsewhere classified, lumbar region, subsequent encounter for fracture with routine healing: Secondary | ICD-10-CM | POA: Diagnosis not present

## 2020-09-11 DIAGNOSIS — M4856XD Collapsed vertebra, not elsewhere classified, lumbar region, subsequent encounter for fracture with routine healing: Secondary | ICD-10-CM | POA: Diagnosis not present

## 2020-09-11 DIAGNOSIS — B9689 Other specified bacterial agents as the cause of diseases classified elsewhere: Secondary | ICD-10-CM | POA: Diagnosis not present

## 2020-09-11 DIAGNOSIS — L03116 Cellulitis of left lower limb: Secondary | ICD-10-CM | POA: Diagnosis not present

## 2020-09-11 DIAGNOSIS — T8141XD Infection following a procedure, superficial incisional surgical site, subsequent encounter: Secondary | ICD-10-CM | POA: Diagnosis not present

## 2020-09-11 DIAGNOSIS — N390 Urinary tract infection, site not specified: Secondary | ICD-10-CM | POA: Diagnosis not present

## 2020-09-11 DIAGNOSIS — S72142D Displaced intertrochanteric fracture of left femur, subsequent encounter for closed fracture with routine healing: Secondary | ICD-10-CM | POA: Diagnosis not present

## 2020-09-12 DIAGNOSIS — D62 Acute posthemorrhagic anemia: Secondary | ICD-10-CM | POA: Diagnosis not present

## 2020-09-12 DIAGNOSIS — S72142D Displaced intertrochanteric fracture of left femur, subsequent encounter for closed fracture with routine healing: Secondary | ICD-10-CM | POA: Diagnosis not present

## 2020-09-12 DIAGNOSIS — L03116 Cellulitis of left lower limb: Secondary | ICD-10-CM | POA: Diagnosis not present

## 2020-09-12 DIAGNOSIS — N390 Urinary tract infection, site not specified: Secondary | ICD-10-CM | POA: Diagnosis not present

## 2020-09-12 DIAGNOSIS — M4856XD Collapsed vertebra, not elsewhere classified, lumbar region, subsequent encounter for fracture with routine healing: Secondary | ICD-10-CM | POA: Diagnosis not present

## 2020-09-12 DIAGNOSIS — Z7901 Long term (current) use of anticoagulants: Secondary | ICD-10-CM | POA: Diagnosis not present

## 2020-09-12 DIAGNOSIS — Z79891 Long term (current) use of opiate analgesic: Secondary | ICD-10-CM | POA: Diagnosis not present

## 2020-09-12 DIAGNOSIS — Z86718 Personal history of other venous thrombosis and embolism: Secondary | ICD-10-CM | POA: Diagnosis not present

## 2020-09-12 DIAGNOSIS — W19XXXD Unspecified fall, subsequent encounter: Secondary | ICD-10-CM | POA: Diagnosis not present

## 2020-09-12 DIAGNOSIS — B9689 Other specified bacterial agents as the cause of diseases classified elsewhere: Secondary | ICD-10-CM | POA: Diagnosis not present

## 2020-09-12 DIAGNOSIS — K219 Gastro-esophageal reflux disease without esophagitis: Secondary | ICD-10-CM | POA: Diagnosis not present

## 2020-09-12 DIAGNOSIS — Z87440 Personal history of urinary (tract) infections: Secondary | ICD-10-CM | POA: Diagnosis not present

## 2020-09-12 DIAGNOSIS — T8141XD Infection following a procedure, superficial incisional surgical site, subsequent encounter: Secondary | ICD-10-CM | POA: Diagnosis not present

## 2020-09-12 DIAGNOSIS — Z86711 Personal history of pulmonary embolism: Secondary | ICD-10-CM | POA: Diagnosis not present

## 2020-09-12 DIAGNOSIS — Z9181 History of falling: Secondary | ICD-10-CM | POA: Diagnosis not present

## 2020-09-12 DIAGNOSIS — F4322 Adjustment disorder with anxiety: Secondary | ICD-10-CM | POA: Diagnosis not present

## 2020-09-13 DIAGNOSIS — T8141XD Infection following a procedure, superficial incisional surgical site, subsequent encounter: Secondary | ICD-10-CM | POA: Diagnosis not present

## 2020-09-13 DIAGNOSIS — L03116 Cellulitis of left lower limb: Secondary | ICD-10-CM | POA: Diagnosis not present

## 2020-09-13 DIAGNOSIS — N390 Urinary tract infection, site not specified: Secondary | ICD-10-CM | POA: Diagnosis not present

## 2020-09-13 DIAGNOSIS — S72142D Displaced intertrochanteric fracture of left femur, subsequent encounter for closed fracture with routine healing: Secondary | ICD-10-CM | POA: Diagnosis not present

## 2020-09-13 DIAGNOSIS — B9689 Other specified bacterial agents as the cause of diseases classified elsewhere: Secondary | ICD-10-CM | POA: Diagnosis not present

## 2020-09-13 DIAGNOSIS — M4856XD Collapsed vertebra, not elsewhere classified, lumbar region, subsequent encounter for fracture with routine healing: Secondary | ICD-10-CM | POA: Diagnosis not present

## 2020-09-14 DIAGNOSIS — S32030A Wedge compression fracture of third lumbar vertebra, initial encounter for closed fracture: Secondary | ICD-10-CM | POA: Diagnosis not present

## 2020-09-14 DIAGNOSIS — M545 Low back pain: Secondary | ICD-10-CM | POA: Diagnosis not present

## 2020-09-17 ENCOUNTER — Other Ambulatory Visit: Payer: Self-pay | Admitting: Orthopedic Surgery

## 2020-09-17 DIAGNOSIS — M4856XD Collapsed vertebra, not elsewhere classified, lumbar region, subsequent encounter for fracture with routine healing: Secondary | ICD-10-CM | POA: Diagnosis not present

## 2020-09-17 DIAGNOSIS — S72142D Displaced intertrochanteric fracture of left femur, subsequent encounter for closed fracture with routine healing: Secondary | ICD-10-CM | POA: Diagnosis not present

## 2020-09-17 DIAGNOSIS — T8141XD Infection following a procedure, superficial incisional surgical site, subsequent encounter: Secondary | ICD-10-CM | POA: Diagnosis not present

## 2020-09-17 DIAGNOSIS — L03116 Cellulitis of left lower limb: Secondary | ICD-10-CM | POA: Diagnosis not present

## 2020-09-17 DIAGNOSIS — M545 Low back pain, unspecified: Secondary | ICD-10-CM

## 2020-09-17 DIAGNOSIS — B9689 Other specified bacterial agents as the cause of diseases classified elsewhere: Secondary | ICD-10-CM | POA: Diagnosis not present

## 2020-09-17 DIAGNOSIS — N390 Urinary tract infection, site not specified: Secondary | ICD-10-CM | POA: Diagnosis not present

## 2020-09-18 DIAGNOSIS — S72142D Displaced intertrochanteric fracture of left femur, subsequent encounter for closed fracture with routine healing: Secondary | ICD-10-CM | POA: Diagnosis not present

## 2020-09-18 DIAGNOSIS — T8141XD Infection following a procedure, superficial incisional surgical site, subsequent encounter: Secondary | ICD-10-CM | POA: Diagnosis not present

## 2020-09-18 DIAGNOSIS — K5900 Constipation, unspecified: Secondary | ICD-10-CM | POA: Diagnosis not present

## 2020-09-18 DIAGNOSIS — B9689 Other specified bacterial agents as the cause of diseases classified elsewhere: Secondary | ICD-10-CM | POA: Diagnosis not present

## 2020-09-18 DIAGNOSIS — N390 Urinary tract infection, site not specified: Secondary | ICD-10-CM | POA: Diagnosis not present

## 2020-09-18 DIAGNOSIS — R1110 Vomiting, unspecified: Secondary | ICD-10-CM | POA: Diagnosis not present

## 2020-09-18 DIAGNOSIS — L03116 Cellulitis of left lower limb: Secondary | ICD-10-CM | POA: Diagnosis not present

## 2020-09-18 DIAGNOSIS — M4856XD Collapsed vertebra, not elsewhere classified, lumbar region, subsequent encounter for fracture with routine healing: Secondary | ICD-10-CM | POA: Diagnosis not present

## 2020-09-21 DIAGNOSIS — S72142D Displaced intertrochanteric fracture of left femur, subsequent encounter for closed fracture with routine healing: Secondary | ICD-10-CM | POA: Diagnosis not present

## 2020-09-21 DIAGNOSIS — M4856XD Collapsed vertebra, not elsewhere classified, lumbar region, subsequent encounter for fracture with routine healing: Secondary | ICD-10-CM | POA: Diagnosis not present

## 2020-09-21 DIAGNOSIS — L03116 Cellulitis of left lower limb: Secondary | ICD-10-CM | POA: Diagnosis not present

## 2020-09-21 DIAGNOSIS — N390 Urinary tract infection, site not specified: Secondary | ICD-10-CM | POA: Diagnosis not present

## 2020-09-21 DIAGNOSIS — B9689 Other specified bacterial agents as the cause of diseases classified elsewhere: Secondary | ICD-10-CM | POA: Diagnosis not present

## 2020-09-21 DIAGNOSIS — T8141XD Infection following a procedure, superficial incisional surgical site, subsequent encounter: Secondary | ICD-10-CM | POA: Diagnosis not present

## 2020-09-26 DIAGNOSIS — L03116 Cellulitis of left lower limb: Secondary | ICD-10-CM | POA: Diagnosis not present

## 2020-09-26 DIAGNOSIS — T8141XD Infection following a procedure, superficial incisional surgical site, subsequent encounter: Secondary | ICD-10-CM | POA: Diagnosis not present

## 2020-09-26 DIAGNOSIS — S72142D Displaced intertrochanteric fracture of left femur, subsequent encounter for closed fracture with routine healing: Secondary | ICD-10-CM | POA: Diagnosis not present

## 2020-09-26 DIAGNOSIS — B9689 Other specified bacterial agents as the cause of diseases classified elsewhere: Secondary | ICD-10-CM | POA: Diagnosis not present

## 2020-09-26 DIAGNOSIS — N390 Urinary tract infection, site not specified: Secondary | ICD-10-CM | POA: Diagnosis not present

## 2020-09-26 DIAGNOSIS — M4856XD Collapsed vertebra, not elsewhere classified, lumbar region, subsequent encounter for fracture with routine healing: Secondary | ICD-10-CM | POA: Diagnosis not present

## 2020-09-27 DIAGNOSIS — M4856XD Collapsed vertebra, not elsewhere classified, lumbar region, subsequent encounter for fracture with routine healing: Secondary | ICD-10-CM | POA: Diagnosis not present

## 2020-09-27 DIAGNOSIS — S72142D Displaced intertrochanteric fracture of left femur, subsequent encounter for closed fracture with routine healing: Secondary | ICD-10-CM | POA: Diagnosis not present

## 2020-09-27 DIAGNOSIS — T8141XD Infection following a procedure, superficial incisional surgical site, subsequent encounter: Secondary | ICD-10-CM | POA: Diagnosis not present

## 2020-09-27 DIAGNOSIS — N390 Urinary tract infection, site not specified: Secondary | ICD-10-CM | POA: Diagnosis not present

## 2020-09-27 DIAGNOSIS — B9689 Other specified bacterial agents as the cause of diseases classified elsewhere: Secondary | ICD-10-CM | POA: Diagnosis not present

## 2020-09-27 DIAGNOSIS — L03116 Cellulitis of left lower limb: Secondary | ICD-10-CM | POA: Diagnosis not present

## 2020-09-28 DIAGNOSIS — M4856XD Collapsed vertebra, not elsewhere classified, lumbar region, subsequent encounter for fracture with routine healing: Secondary | ICD-10-CM | POA: Diagnosis not present

## 2020-09-28 DIAGNOSIS — T8141XD Infection following a procedure, superficial incisional surgical site, subsequent encounter: Secondary | ICD-10-CM | POA: Diagnosis not present

## 2020-09-28 DIAGNOSIS — L03116 Cellulitis of left lower limb: Secondary | ICD-10-CM | POA: Diagnosis not present

## 2020-09-28 DIAGNOSIS — B9689 Other specified bacterial agents as the cause of diseases classified elsewhere: Secondary | ICD-10-CM | POA: Diagnosis not present

## 2020-09-28 DIAGNOSIS — N390 Urinary tract infection, site not specified: Secondary | ICD-10-CM | POA: Diagnosis not present

## 2020-09-28 DIAGNOSIS — S72142D Displaced intertrochanteric fracture of left femur, subsequent encounter for closed fracture with routine healing: Secondary | ICD-10-CM | POA: Diagnosis not present

## 2020-10-01 DIAGNOSIS — N390 Urinary tract infection, site not specified: Secondary | ICD-10-CM | POA: Diagnosis not present

## 2020-10-01 DIAGNOSIS — T8141XD Infection following a procedure, superficial incisional surgical site, subsequent encounter: Secondary | ICD-10-CM | POA: Diagnosis not present

## 2020-10-01 DIAGNOSIS — B9689 Other specified bacterial agents as the cause of diseases classified elsewhere: Secondary | ICD-10-CM | POA: Diagnosis not present

## 2020-10-01 DIAGNOSIS — S72142D Displaced intertrochanteric fracture of left femur, subsequent encounter for closed fracture with routine healing: Secondary | ICD-10-CM | POA: Diagnosis not present

## 2020-10-01 DIAGNOSIS — M4856XD Collapsed vertebra, not elsewhere classified, lumbar region, subsequent encounter for fracture with routine healing: Secondary | ICD-10-CM | POA: Diagnosis not present

## 2020-10-01 DIAGNOSIS — L03116 Cellulitis of left lower limb: Secondary | ICD-10-CM | POA: Diagnosis not present

## 2020-10-02 DIAGNOSIS — Z23 Encounter for immunization: Secondary | ICD-10-CM | POA: Diagnosis not present

## 2020-10-02 DIAGNOSIS — E785 Hyperlipidemia, unspecified: Secondary | ICD-10-CM | POA: Diagnosis not present

## 2020-10-02 DIAGNOSIS — M8588 Other specified disorders of bone density and structure, other site: Secondary | ICD-10-CM | POA: Diagnosis not present

## 2020-10-02 DIAGNOSIS — Z0000 Encounter for general adult medical examination without abnormal findings: Secondary | ICD-10-CM | POA: Diagnosis not present

## 2020-10-02 DIAGNOSIS — Z86711 Personal history of pulmonary embolism: Secondary | ICD-10-CM | POA: Diagnosis not present

## 2020-10-02 DIAGNOSIS — S32039D Unspecified fracture of third lumbar vertebra, subsequent encounter for fracture with routine healing: Secondary | ICD-10-CM | POA: Diagnosis not present

## 2020-10-02 DIAGNOSIS — R7309 Other abnormal glucose: Secondary | ICD-10-CM | POA: Diagnosis not present

## 2020-10-04 DIAGNOSIS — M4856XD Collapsed vertebra, not elsewhere classified, lumbar region, subsequent encounter for fracture with routine healing: Secondary | ICD-10-CM | POA: Diagnosis not present

## 2020-10-04 DIAGNOSIS — T8141XD Infection following a procedure, superficial incisional surgical site, subsequent encounter: Secondary | ICD-10-CM | POA: Diagnosis not present

## 2020-10-04 DIAGNOSIS — N390 Urinary tract infection, site not specified: Secondary | ICD-10-CM | POA: Diagnosis not present

## 2020-10-04 DIAGNOSIS — B9689 Other specified bacterial agents as the cause of diseases classified elsewhere: Secondary | ICD-10-CM | POA: Diagnosis not present

## 2020-10-04 DIAGNOSIS — S72142D Displaced intertrochanteric fracture of left femur, subsequent encounter for closed fracture with routine healing: Secondary | ICD-10-CM | POA: Diagnosis not present

## 2020-10-04 DIAGNOSIS — L03116 Cellulitis of left lower limb: Secondary | ICD-10-CM | POA: Diagnosis not present

## 2020-10-05 DIAGNOSIS — S72142D Displaced intertrochanteric fracture of left femur, subsequent encounter for closed fracture with routine healing: Secondary | ICD-10-CM | POA: Diagnosis not present

## 2020-10-05 DIAGNOSIS — B9689 Other specified bacterial agents as the cause of diseases classified elsewhere: Secondary | ICD-10-CM | POA: Diagnosis not present

## 2020-10-05 DIAGNOSIS — M4856XD Collapsed vertebra, not elsewhere classified, lumbar region, subsequent encounter for fracture with routine healing: Secondary | ICD-10-CM | POA: Diagnosis not present

## 2020-10-05 DIAGNOSIS — T8141XD Infection following a procedure, superficial incisional surgical site, subsequent encounter: Secondary | ICD-10-CM | POA: Diagnosis not present

## 2020-10-05 DIAGNOSIS — N390 Urinary tract infection, site not specified: Secondary | ICD-10-CM | POA: Diagnosis not present

## 2020-10-05 DIAGNOSIS — L03116 Cellulitis of left lower limb: Secondary | ICD-10-CM | POA: Diagnosis not present

## 2020-10-08 ENCOUNTER — Ambulatory Visit
Admission: RE | Admit: 2020-10-08 | Discharge: 2020-10-08 | Disposition: A | Discharge status: Home / Self Care | Payer: Medicare Other | Source: Ambulatory Visit | Attending: Orthopedic Surgery | Admitting: Orthopedic Surgery

## 2020-10-08 ENCOUNTER — Other Ambulatory Visit: Payer: Self-pay

## 2020-10-08 DIAGNOSIS — M545 Low back pain, unspecified: Secondary | ICD-10-CM

## 2020-10-08 DIAGNOSIS — M48061 Spinal stenosis, lumbar region without neurogenic claudication: Secondary | ICD-10-CM | POA: Diagnosis not present

## 2020-10-09 DIAGNOSIS — S72142D Displaced intertrochanteric fracture of left femur, subsequent encounter for closed fracture with routine healing: Secondary | ICD-10-CM | POA: Diagnosis not present

## 2020-10-09 DIAGNOSIS — M4856XD Collapsed vertebra, not elsewhere classified, lumbar region, subsequent encounter for fracture with routine healing: Secondary | ICD-10-CM | POA: Diagnosis not present

## 2020-10-09 DIAGNOSIS — B9689 Other specified bacterial agents as the cause of diseases classified elsewhere: Secondary | ICD-10-CM | POA: Diagnosis not present

## 2020-10-09 DIAGNOSIS — L03116 Cellulitis of left lower limb: Secondary | ICD-10-CM | POA: Diagnosis not present

## 2020-10-09 DIAGNOSIS — N390 Urinary tract infection, site not specified: Secondary | ICD-10-CM | POA: Diagnosis not present

## 2020-10-09 DIAGNOSIS — T8141XD Infection following a procedure, superficial incisional surgical site, subsequent encounter: Secondary | ICD-10-CM | POA: Diagnosis not present

## 2020-10-11 DIAGNOSIS — N390 Urinary tract infection, site not specified: Secondary | ICD-10-CM | POA: Diagnosis not present

## 2020-10-11 DIAGNOSIS — M4856XD Collapsed vertebra, not elsewhere classified, lumbar region, subsequent encounter for fracture with routine healing: Secondary | ICD-10-CM | POA: Diagnosis not present

## 2020-10-11 DIAGNOSIS — L03116 Cellulitis of left lower limb: Secondary | ICD-10-CM | POA: Diagnosis not present

## 2020-10-11 DIAGNOSIS — S72142D Displaced intertrochanteric fracture of left femur, subsequent encounter for closed fracture with routine healing: Secondary | ICD-10-CM | POA: Diagnosis not present

## 2020-10-11 DIAGNOSIS — T8141XD Infection following a procedure, superficial incisional surgical site, subsequent encounter: Secondary | ICD-10-CM | POA: Diagnosis not present

## 2020-10-11 DIAGNOSIS — B9689 Other specified bacterial agents as the cause of diseases classified elsewhere: Secondary | ICD-10-CM | POA: Diagnosis not present

## 2020-10-12 DIAGNOSIS — M4856XD Collapsed vertebra, not elsewhere classified, lumbar region, subsequent encounter for fracture with routine healing: Secondary | ICD-10-CM | POA: Diagnosis not present

## 2020-10-12 DIAGNOSIS — Z9181 History of falling: Secondary | ICD-10-CM | POA: Diagnosis not present

## 2020-10-12 DIAGNOSIS — Z7901 Long term (current) use of anticoagulants: Secondary | ICD-10-CM | POA: Diagnosis not present

## 2020-10-12 DIAGNOSIS — K219 Gastro-esophageal reflux disease without esophagitis: Secondary | ICD-10-CM | POA: Diagnosis not present

## 2020-10-12 DIAGNOSIS — Z87440 Personal history of urinary (tract) infections: Secondary | ICD-10-CM | POA: Diagnosis not present

## 2020-10-12 DIAGNOSIS — M545 Low back pain, unspecified: Secondary | ICD-10-CM | POA: Diagnosis not present

## 2020-10-12 DIAGNOSIS — T8141XD Infection following a procedure, superficial incisional surgical site, subsequent encounter: Secondary | ICD-10-CM | POA: Diagnosis not present

## 2020-10-12 DIAGNOSIS — D62 Acute posthemorrhagic anemia: Secondary | ICD-10-CM | POA: Diagnosis not present

## 2020-10-12 DIAGNOSIS — S72142D Displaced intertrochanteric fracture of left femur, subsequent encounter for closed fracture with routine healing: Secondary | ICD-10-CM | POA: Diagnosis not present

## 2020-10-12 DIAGNOSIS — Z86711 Personal history of pulmonary embolism: Secondary | ICD-10-CM | POA: Diagnosis not present

## 2020-10-12 DIAGNOSIS — Z86718 Personal history of other venous thrombosis and embolism: Secondary | ICD-10-CM | POA: Diagnosis not present

## 2020-10-12 DIAGNOSIS — F4322 Adjustment disorder with anxiety: Secondary | ICD-10-CM | POA: Diagnosis not present

## 2020-10-16 DIAGNOSIS — T8141XD Infection following a procedure, superficial incisional surgical site, subsequent encounter: Secondary | ICD-10-CM | POA: Diagnosis not present

## 2020-10-16 DIAGNOSIS — K219 Gastro-esophageal reflux disease without esophagitis: Secondary | ICD-10-CM | POA: Diagnosis not present

## 2020-10-16 DIAGNOSIS — M4856XD Collapsed vertebra, not elsewhere classified, lumbar region, subsequent encounter for fracture with routine healing: Secondary | ICD-10-CM | POA: Diagnosis not present

## 2020-10-16 DIAGNOSIS — D62 Acute posthemorrhagic anemia: Secondary | ICD-10-CM | POA: Diagnosis not present

## 2020-10-16 DIAGNOSIS — F4322 Adjustment disorder with anxiety: Secondary | ICD-10-CM | POA: Diagnosis not present

## 2020-10-16 DIAGNOSIS — S72142D Displaced intertrochanteric fracture of left femur, subsequent encounter for closed fracture with routine healing: Secondary | ICD-10-CM | POA: Diagnosis not present

## 2020-10-18 DIAGNOSIS — M4856XD Collapsed vertebra, not elsewhere classified, lumbar region, subsequent encounter for fracture with routine healing: Secondary | ICD-10-CM | POA: Diagnosis not present

## 2020-10-18 DIAGNOSIS — T8141XD Infection following a procedure, superficial incisional surgical site, subsequent encounter: Secondary | ICD-10-CM | POA: Diagnosis not present

## 2020-10-18 DIAGNOSIS — K219 Gastro-esophageal reflux disease without esophagitis: Secondary | ICD-10-CM | POA: Diagnosis not present

## 2020-10-18 DIAGNOSIS — S72142D Displaced intertrochanteric fracture of left femur, subsequent encounter for closed fracture with routine healing: Secondary | ICD-10-CM | POA: Diagnosis not present

## 2020-10-18 DIAGNOSIS — F4322 Adjustment disorder with anxiety: Secondary | ICD-10-CM | POA: Diagnosis not present

## 2020-10-18 DIAGNOSIS — D62 Acute posthemorrhagic anemia: Secondary | ICD-10-CM | POA: Diagnosis not present

## 2020-10-25 DIAGNOSIS — S72142D Displaced intertrochanteric fracture of left femur, subsequent encounter for closed fracture with routine healing: Secondary | ICD-10-CM | POA: Diagnosis not present

## 2020-10-25 DIAGNOSIS — F4322 Adjustment disorder with anxiety: Secondary | ICD-10-CM | POA: Diagnosis not present

## 2020-10-25 DIAGNOSIS — D62 Acute posthemorrhagic anemia: Secondary | ICD-10-CM | POA: Diagnosis not present

## 2020-10-25 DIAGNOSIS — K219 Gastro-esophageal reflux disease without esophagitis: Secondary | ICD-10-CM | POA: Diagnosis not present

## 2020-10-25 DIAGNOSIS — T8141XD Infection following a procedure, superficial incisional surgical site, subsequent encounter: Secondary | ICD-10-CM | POA: Diagnosis not present

## 2020-10-25 DIAGNOSIS — M4856XD Collapsed vertebra, not elsewhere classified, lumbar region, subsequent encounter for fracture with routine healing: Secondary | ICD-10-CM | POA: Diagnosis not present

## 2020-10-27 DIAGNOSIS — M4856XD Collapsed vertebra, not elsewhere classified, lumbar region, subsequent encounter for fracture with routine healing: Secondary | ICD-10-CM | POA: Diagnosis not present

## 2020-10-27 DIAGNOSIS — T8141XD Infection following a procedure, superficial incisional surgical site, subsequent encounter: Secondary | ICD-10-CM | POA: Diagnosis not present

## 2020-10-27 DIAGNOSIS — K219 Gastro-esophageal reflux disease without esophagitis: Secondary | ICD-10-CM | POA: Diagnosis not present

## 2020-10-27 DIAGNOSIS — F4322 Adjustment disorder with anxiety: Secondary | ICD-10-CM | POA: Diagnosis not present

## 2020-10-27 DIAGNOSIS — D62 Acute posthemorrhagic anemia: Secondary | ICD-10-CM | POA: Diagnosis not present

## 2020-10-27 DIAGNOSIS — S72142D Displaced intertrochanteric fracture of left femur, subsequent encounter for closed fracture with routine healing: Secondary | ICD-10-CM | POA: Diagnosis not present

## 2020-10-30 DIAGNOSIS — F4322 Adjustment disorder with anxiety: Secondary | ICD-10-CM | POA: Diagnosis not present

## 2020-10-30 DIAGNOSIS — S72142D Displaced intertrochanteric fracture of left femur, subsequent encounter for closed fracture with routine healing: Secondary | ICD-10-CM | POA: Diagnosis not present

## 2020-10-30 DIAGNOSIS — D62 Acute posthemorrhagic anemia: Secondary | ICD-10-CM | POA: Diagnosis not present

## 2020-10-30 DIAGNOSIS — K219 Gastro-esophageal reflux disease without esophagitis: Secondary | ICD-10-CM | POA: Diagnosis not present

## 2020-10-30 DIAGNOSIS — T8141XD Infection following a procedure, superficial incisional surgical site, subsequent encounter: Secondary | ICD-10-CM | POA: Diagnosis not present

## 2020-10-30 DIAGNOSIS — M4856XD Collapsed vertebra, not elsewhere classified, lumbar region, subsequent encounter for fracture with routine healing: Secondary | ICD-10-CM | POA: Diagnosis not present

## 2020-11-01 DIAGNOSIS — K219 Gastro-esophageal reflux disease without esophagitis: Secondary | ICD-10-CM | POA: Diagnosis not present

## 2020-11-01 DIAGNOSIS — M4856XD Collapsed vertebra, not elsewhere classified, lumbar region, subsequent encounter for fracture with routine healing: Secondary | ICD-10-CM | POA: Diagnosis not present

## 2020-11-01 DIAGNOSIS — D62 Acute posthemorrhagic anemia: Secondary | ICD-10-CM | POA: Diagnosis not present

## 2020-11-01 DIAGNOSIS — S72142D Displaced intertrochanteric fracture of left femur, subsequent encounter for closed fracture with routine healing: Secondary | ICD-10-CM | POA: Diagnosis not present

## 2020-11-01 DIAGNOSIS — F4322 Adjustment disorder with anxiety: Secondary | ICD-10-CM | POA: Diagnosis not present

## 2020-11-01 DIAGNOSIS — T8141XD Infection following a procedure, superficial incisional surgical site, subsequent encounter: Secondary | ICD-10-CM | POA: Diagnosis not present

## 2020-11-05 ENCOUNTER — Other Ambulatory Visit: Payer: Self-pay | Admitting: *Deleted

## 2020-11-05 ENCOUNTER — Telehealth: Payer: Self-pay | Admitting: *Deleted

## 2020-11-05 DIAGNOSIS — I82409 Acute embolism and thrombosis of unspecified deep veins of unspecified lower extremity: Secondary | ICD-10-CM

## 2020-11-05 MED ORDER — RIVAROXABAN 20 MG PO TABS: 20.0000 mg | ORAL_TABLET | Freq: Every day | ORAL | 0 refills | Status: DC

## 2020-11-05 NOTE — Telephone Encounter (Signed)
Please refill.

## 2020-11-05 NOTE — Telephone Encounter (Signed)
Patients spouse called requesting refill for Xarelto but was wanted to change pharmacy to [facility] instead of previous pharmacy used.  Routed to MD to review

## 2020-11-09 DIAGNOSIS — S32030A Wedge compression fracture of third lumbar vertebra, initial encounter for closed fracture: Secondary | ICD-10-CM | POA: Diagnosis not present

## 2020-11-09 DIAGNOSIS — M545 Low back pain, unspecified: Secondary | ICD-10-CM | POA: Diagnosis not present

## 2020-11-11 DIAGNOSIS — T8141XD Infection following a procedure, superficial incisional surgical site, subsequent encounter: Secondary | ICD-10-CM | POA: Diagnosis not present

## 2020-11-11 DIAGNOSIS — D62 Acute posthemorrhagic anemia: Secondary | ICD-10-CM | POA: Diagnosis not present

## 2020-11-11 DIAGNOSIS — Z87440 Personal history of urinary (tract) infections: Secondary | ICD-10-CM | POA: Diagnosis not present

## 2020-11-11 DIAGNOSIS — Z86711 Personal history of pulmonary embolism: Secondary | ICD-10-CM | POA: Diagnosis not present

## 2020-11-11 DIAGNOSIS — Z9181 History of falling: Secondary | ICD-10-CM | POA: Diagnosis not present

## 2020-11-11 DIAGNOSIS — Z7901 Long term (current) use of anticoagulants: Secondary | ICD-10-CM | POA: Diagnosis not present

## 2020-11-11 DIAGNOSIS — Z86718 Personal history of other venous thrombosis and embolism: Secondary | ICD-10-CM | POA: Diagnosis not present

## 2020-11-11 DIAGNOSIS — M4856XD Collapsed vertebra, not elsewhere classified, lumbar region, subsequent encounter for fracture with routine healing: Secondary | ICD-10-CM | POA: Diagnosis not present

## 2020-11-11 DIAGNOSIS — K219 Gastro-esophageal reflux disease without esophagitis: Secondary | ICD-10-CM | POA: Diagnosis not present

## 2020-11-11 DIAGNOSIS — S72142D Displaced intertrochanteric fracture of left femur, subsequent encounter for closed fracture with routine healing: Secondary | ICD-10-CM | POA: Diagnosis not present

## 2020-11-11 DIAGNOSIS — F4322 Adjustment disorder with anxiety: Secondary | ICD-10-CM | POA: Diagnosis not present

## 2020-11-13 ENCOUNTER — Other Ambulatory Visit: Payer: Self-pay | Admitting: Oncology

## 2020-11-13 DIAGNOSIS — K219 Gastro-esophageal reflux disease without esophagitis: Secondary | ICD-10-CM | POA: Diagnosis not present

## 2020-11-13 DIAGNOSIS — M4856XD Collapsed vertebra, not elsewhere classified, lumbar region, subsequent encounter for fracture with routine healing: Secondary | ICD-10-CM | POA: Diagnosis not present

## 2020-11-13 DIAGNOSIS — I82409 Acute embolism and thrombosis of unspecified deep veins of unspecified lower extremity: Secondary | ICD-10-CM

## 2020-11-13 DIAGNOSIS — F4322 Adjustment disorder with anxiety: Secondary | ICD-10-CM | POA: Diagnosis not present

## 2020-11-13 DIAGNOSIS — T8141XD Infection following a procedure, superficial incisional surgical site, subsequent encounter: Secondary | ICD-10-CM | POA: Diagnosis not present

## 2020-11-13 DIAGNOSIS — D62 Acute posthemorrhagic anemia: Secondary | ICD-10-CM | POA: Diagnosis not present

## 2020-11-13 DIAGNOSIS — S72142D Displaced intertrochanteric fracture of left femur, subsequent encounter for closed fracture with routine healing: Secondary | ICD-10-CM | POA: Diagnosis not present

## 2020-11-20 DIAGNOSIS — T8141XD Infection following a procedure, superficial incisional surgical site, subsequent encounter: Secondary | ICD-10-CM | POA: Diagnosis not present

## 2020-11-20 DIAGNOSIS — F4322 Adjustment disorder with anxiety: Secondary | ICD-10-CM | POA: Diagnosis not present

## 2020-11-20 DIAGNOSIS — M4856XD Collapsed vertebra, not elsewhere classified, lumbar region, subsequent encounter for fracture with routine healing: Secondary | ICD-10-CM | POA: Diagnosis not present

## 2020-11-20 DIAGNOSIS — K219 Gastro-esophageal reflux disease without esophagitis: Secondary | ICD-10-CM | POA: Diagnosis not present

## 2020-11-20 DIAGNOSIS — S72142D Displaced intertrochanteric fracture of left femur, subsequent encounter for closed fracture with routine healing: Secondary | ICD-10-CM | POA: Diagnosis not present

## 2020-11-20 DIAGNOSIS — D62 Acute posthemorrhagic anemia: Secondary | ICD-10-CM | POA: Diagnosis not present

## 2020-11-22 DIAGNOSIS — D62 Acute posthemorrhagic anemia: Secondary | ICD-10-CM | POA: Diagnosis not present

## 2020-11-22 DIAGNOSIS — S72142D Displaced intertrochanteric fracture of left femur, subsequent encounter for closed fracture with routine healing: Secondary | ICD-10-CM | POA: Diagnosis not present

## 2020-11-22 DIAGNOSIS — F4322 Adjustment disorder with anxiety: Secondary | ICD-10-CM | POA: Diagnosis not present

## 2020-11-22 DIAGNOSIS — T8141XD Infection following a procedure, superficial incisional surgical site, subsequent encounter: Secondary | ICD-10-CM | POA: Diagnosis not present

## 2020-11-22 DIAGNOSIS — K219 Gastro-esophageal reflux disease without esophagitis: Secondary | ICD-10-CM | POA: Diagnosis not present

## 2020-11-22 DIAGNOSIS — M4856XD Collapsed vertebra, not elsewhere classified, lumbar region, subsequent encounter for fracture with routine healing: Secondary | ICD-10-CM | POA: Diagnosis not present

## 2020-11-23 DIAGNOSIS — M4856XD Collapsed vertebra, not elsewhere classified, lumbar region, subsequent encounter for fracture with routine healing: Secondary | ICD-10-CM | POA: Diagnosis not present

## 2020-11-23 DIAGNOSIS — D62 Acute posthemorrhagic anemia: Secondary | ICD-10-CM | POA: Diagnosis not present

## 2020-11-23 DIAGNOSIS — F4322 Adjustment disorder with anxiety: Secondary | ICD-10-CM | POA: Diagnosis not present

## 2020-11-23 DIAGNOSIS — K219 Gastro-esophageal reflux disease without esophagitis: Secondary | ICD-10-CM | POA: Diagnosis not present

## 2020-11-23 DIAGNOSIS — S72142D Displaced intertrochanteric fracture of left femur, subsequent encounter for closed fracture with routine healing: Secondary | ICD-10-CM | POA: Diagnosis not present

## 2020-11-23 DIAGNOSIS — T8141XD Infection following a procedure, superficial incisional surgical site, subsequent encounter: Secondary | ICD-10-CM | POA: Diagnosis not present

## 2020-11-28 ENCOUNTER — Other Ambulatory Visit: Payer: Self-pay | Admitting: Oncology

## 2020-11-28 DIAGNOSIS — K219 Gastro-esophageal reflux disease without esophagitis: Secondary | ICD-10-CM | POA: Diagnosis not present

## 2020-11-28 DIAGNOSIS — T8141XD Infection following a procedure, superficial incisional surgical site, subsequent encounter: Secondary | ICD-10-CM | POA: Diagnosis not present

## 2020-11-28 DIAGNOSIS — F4322 Adjustment disorder with anxiety: Secondary | ICD-10-CM | POA: Diagnosis not present

## 2020-11-28 DIAGNOSIS — D62 Acute posthemorrhagic anemia: Secondary | ICD-10-CM | POA: Diagnosis not present

## 2020-11-28 DIAGNOSIS — S72142D Displaced intertrochanteric fracture of left femur, subsequent encounter for closed fracture with routine healing: Secondary | ICD-10-CM | POA: Diagnosis not present

## 2020-11-28 DIAGNOSIS — I82409 Acute embolism and thrombosis of unspecified deep veins of unspecified lower extremity: Secondary | ICD-10-CM

## 2020-11-28 DIAGNOSIS — M4856XD Collapsed vertebra, not elsewhere classified, lumbar region, subsequent encounter for fracture with routine healing: Secondary | ICD-10-CM | POA: Diagnosis not present

## 2020-12-03 DIAGNOSIS — U071 COVID-19: Secondary | ICD-10-CM | POA: Diagnosis not present

## 2020-12-04 DIAGNOSIS — S72142D Displaced intertrochanteric fracture of left femur, subsequent encounter for closed fracture with routine healing: Secondary | ICD-10-CM | POA: Diagnosis not present

## 2020-12-04 DIAGNOSIS — K219 Gastro-esophageal reflux disease without esophagitis: Secondary | ICD-10-CM | POA: Diagnosis not present

## 2020-12-04 DIAGNOSIS — F4322 Adjustment disorder with anxiety: Secondary | ICD-10-CM | POA: Diagnosis not present

## 2020-12-04 DIAGNOSIS — M4856XD Collapsed vertebra, not elsewhere classified, lumbar region, subsequent encounter for fracture with routine healing: Secondary | ICD-10-CM | POA: Diagnosis not present

## 2020-12-04 DIAGNOSIS — T8141XD Infection following a procedure, superficial incisional surgical site, subsequent encounter: Secondary | ICD-10-CM | POA: Diagnosis not present

## 2020-12-04 DIAGNOSIS — D62 Acute posthemorrhagic anemia: Secondary | ICD-10-CM | POA: Diagnosis not present

## 2020-12-06 DIAGNOSIS — M4856XD Collapsed vertebra, not elsewhere classified, lumbar region, subsequent encounter for fracture with routine healing: Secondary | ICD-10-CM | POA: Diagnosis not present

## 2020-12-06 DIAGNOSIS — D62 Acute posthemorrhagic anemia: Secondary | ICD-10-CM | POA: Diagnosis not present

## 2020-12-06 DIAGNOSIS — S72142D Displaced intertrochanteric fracture of left femur, subsequent encounter for closed fracture with routine healing: Secondary | ICD-10-CM | POA: Diagnosis not present

## 2020-12-06 DIAGNOSIS — K219 Gastro-esophageal reflux disease without esophagitis: Secondary | ICD-10-CM | POA: Diagnosis not present

## 2020-12-06 DIAGNOSIS — T8141XD Infection following a procedure, superficial incisional surgical site, subsequent encounter: Secondary | ICD-10-CM | POA: Diagnosis not present

## 2020-12-06 DIAGNOSIS — F4322 Adjustment disorder with anxiety: Secondary | ICD-10-CM | POA: Diagnosis not present

## 2020-12-11 DIAGNOSIS — F4322 Adjustment disorder with anxiety: Secondary | ICD-10-CM | POA: Diagnosis not present

## 2020-12-11 DIAGNOSIS — Z9181 History of falling: Secondary | ICD-10-CM | POA: Diagnosis not present

## 2020-12-11 DIAGNOSIS — S72142D Displaced intertrochanteric fracture of left femur, subsequent encounter for closed fracture with routine healing: Secondary | ICD-10-CM | POA: Diagnosis not present

## 2020-12-11 DIAGNOSIS — Z86718 Personal history of other venous thrombosis and embolism: Secondary | ICD-10-CM | POA: Diagnosis not present

## 2020-12-11 DIAGNOSIS — K219 Gastro-esophageal reflux disease without esophagitis: Secondary | ICD-10-CM | POA: Diagnosis not present

## 2020-12-11 DIAGNOSIS — Z87440 Personal history of urinary (tract) infections: Secondary | ICD-10-CM | POA: Diagnosis not present

## 2020-12-11 DIAGNOSIS — Z86711 Personal history of pulmonary embolism: Secondary | ICD-10-CM | POA: Diagnosis not present

## 2020-12-11 DIAGNOSIS — U071 COVID-19: Secondary | ICD-10-CM | POA: Diagnosis not present

## 2020-12-11 DIAGNOSIS — Z7901 Long term (current) use of anticoagulants: Secondary | ICD-10-CM | POA: Diagnosis not present

## 2020-12-11 DIAGNOSIS — D62 Acute posthemorrhagic anemia: Secondary | ICD-10-CM | POA: Diagnosis not present

## 2020-12-11 DIAGNOSIS — M4856XD Collapsed vertebra, not elsewhere classified, lumbar region, subsequent encounter for fracture with routine healing: Secondary | ICD-10-CM | POA: Diagnosis not present

## 2020-12-13 DIAGNOSIS — U071 COVID-19: Secondary | ICD-10-CM | POA: Diagnosis not present

## 2020-12-13 DIAGNOSIS — K219 Gastro-esophageal reflux disease without esophagitis: Secondary | ICD-10-CM | POA: Diagnosis not present

## 2020-12-13 DIAGNOSIS — D62 Acute posthemorrhagic anemia: Secondary | ICD-10-CM | POA: Diagnosis not present

## 2020-12-13 DIAGNOSIS — S72142D Displaced intertrochanteric fracture of left femur, subsequent encounter for closed fracture with routine healing: Secondary | ICD-10-CM | POA: Diagnosis not present

## 2020-12-13 DIAGNOSIS — F4322 Adjustment disorder with anxiety: Secondary | ICD-10-CM | POA: Diagnosis not present

## 2020-12-13 DIAGNOSIS — M4856XD Collapsed vertebra, not elsewhere classified, lumbar region, subsequent encounter for fracture with routine healing: Secondary | ICD-10-CM | POA: Diagnosis not present

## 2020-12-18 DIAGNOSIS — S72142D Displaced intertrochanteric fracture of left femur, subsequent encounter for closed fracture with routine healing: Secondary | ICD-10-CM | POA: Diagnosis not present

## 2020-12-18 DIAGNOSIS — D62 Acute posthemorrhagic anemia: Secondary | ICD-10-CM | POA: Diagnosis not present

## 2020-12-18 DIAGNOSIS — M4856XD Collapsed vertebra, not elsewhere classified, lumbar region, subsequent encounter for fracture with routine healing: Secondary | ICD-10-CM | POA: Diagnosis not present

## 2020-12-18 DIAGNOSIS — U071 COVID-19: Secondary | ICD-10-CM | POA: Diagnosis not present

## 2020-12-18 DIAGNOSIS — K219 Gastro-esophageal reflux disease without esophagitis: Secondary | ICD-10-CM | POA: Diagnosis not present

## 2020-12-18 DIAGNOSIS — F4322 Adjustment disorder with anxiety: Secondary | ICD-10-CM | POA: Diagnosis not present

## 2020-12-20 DIAGNOSIS — F4322 Adjustment disorder with anxiety: Secondary | ICD-10-CM | POA: Diagnosis not present

## 2020-12-20 DIAGNOSIS — U071 COVID-19: Secondary | ICD-10-CM | POA: Diagnosis not present

## 2020-12-20 DIAGNOSIS — M4856XD Collapsed vertebra, not elsewhere classified, lumbar region, subsequent encounter for fracture with routine healing: Secondary | ICD-10-CM | POA: Diagnosis not present

## 2020-12-20 DIAGNOSIS — S72142D Displaced intertrochanteric fracture of left femur, subsequent encounter for closed fracture with routine healing: Secondary | ICD-10-CM | POA: Diagnosis not present

## 2020-12-20 DIAGNOSIS — D62 Acute posthemorrhagic anemia: Secondary | ICD-10-CM | POA: Diagnosis not present

## 2020-12-20 DIAGNOSIS — K219 Gastro-esophageal reflux disease without esophagitis: Secondary | ICD-10-CM | POA: Diagnosis not present

## 2020-12-25 ENCOUNTER — Other Ambulatory Visit: Payer: Self-pay | Admitting: Oncology

## 2020-12-25 DIAGNOSIS — M4856XD Collapsed vertebra, not elsewhere classified, lumbar region, subsequent encounter for fracture with routine healing: Secondary | ICD-10-CM | POA: Diagnosis not present

## 2020-12-25 DIAGNOSIS — S72142D Displaced intertrochanteric fracture of left femur, subsequent encounter for closed fracture with routine healing: Secondary | ICD-10-CM | POA: Diagnosis not present

## 2020-12-25 DIAGNOSIS — K219 Gastro-esophageal reflux disease without esophagitis: Secondary | ICD-10-CM | POA: Diagnosis not present

## 2020-12-25 DIAGNOSIS — I82409 Acute embolism and thrombosis of unspecified deep veins of unspecified lower extremity: Secondary | ICD-10-CM

## 2020-12-25 DIAGNOSIS — D62 Acute posthemorrhagic anemia: Secondary | ICD-10-CM | POA: Diagnosis not present

## 2020-12-25 DIAGNOSIS — U071 COVID-19: Secondary | ICD-10-CM | POA: Diagnosis not present

## 2020-12-25 DIAGNOSIS — F4322 Adjustment disorder with anxiety: Secondary | ICD-10-CM | POA: Diagnosis not present

## 2020-12-27 DIAGNOSIS — K219 Gastro-esophageal reflux disease without esophagitis: Secondary | ICD-10-CM | POA: Diagnosis not present

## 2020-12-27 DIAGNOSIS — F4322 Adjustment disorder with anxiety: Secondary | ICD-10-CM | POA: Diagnosis not present

## 2020-12-27 DIAGNOSIS — U071 COVID-19: Secondary | ICD-10-CM | POA: Diagnosis not present

## 2020-12-27 DIAGNOSIS — M4856XD Collapsed vertebra, not elsewhere classified, lumbar region, subsequent encounter for fracture with routine healing: Secondary | ICD-10-CM | POA: Diagnosis not present

## 2020-12-27 DIAGNOSIS — D62 Acute posthemorrhagic anemia: Secondary | ICD-10-CM | POA: Diagnosis not present

## 2020-12-27 DIAGNOSIS — S72142D Displaced intertrochanteric fracture of left femur, subsequent encounter for closed fracture with routine healing: Secondary | ICD-10-CM | POA: Diagnosis not present

## 2021-01-01 DIAGNOSIS — K219 Gastro-esophageal reflux disease without esophagitis: Secondary | ICD-10-CM | POA: Diagnosis not present

## 2021-01-01 DIAGNOSIS — S72142D Displaced intertrochanteric fracture of left femur, subsequent encounter for closed fracture with routine healing: Secondary | ICD-10-CM | POA: Diagnosis not present

## 2021-01-01 DIAGNOSIS — M4856XD Collapsed vertebra, not elsewhere classified, lumbar region, subsequent encounter for fracture with routine healing: Secondary | ICD-10-CM | POA: Diagnosis not present

## 2021-01-01 DIAGNOSIS — U071 COVID-19: Secondary | ICD-10-CM | POA: Diagnosis not present

## 2021-01-01 DIAGNOSIS — F4322 Adjustment disorder with anxiety: Secondary | ICD-10-CM | POA: Diagnosis not present

## 2021-01-01 DIAGNOSIS — D62 Acute posthemorrhagic anemia: Secondary | ICD-10-CM | POA: Diagnosis not present

## 2021-01-03 DIAGNOSIS — M4856XD Collapsed vertebra, not elsewhere classified, lumbar region, subsequent encounter for fracture with routine healing: Secondary | ICD-10-CM | POA: Diagnosis not present

## 2021-01-03 DIAGNOSIS — F4322 Adjustment disorder with anxiety: Secondary | ICD-10-CM | POA: Diagnosis not present

## 2021-01-03 DIAGNOSIS — U071 COVID-19: Secondary | ICD-10-CM | POA: Diagnosis not present

## 2021-01-03 DIAGNOSIS — K219 Gastro-esophageal reflux disease without esophagitis: Secondary | ICD-10-CM | POA: Diagnosis not present

## 2021-01-03 DIAGNOSIS — D62 Acute posthemorrhagic anemia: Secondary | ICD-10-CM | POA: Diagnosis not present

## 2021-01-03 DIAGNOSIS — S72142D Displaced intertrochanteric fracture of left femur, subsequent encounter for closed fracture with routine healing: Secondary | ICD-10-CM | POA: Diagnosis not present

## 2021-01-08 DIAGNOSIS — F4322 Adjustment disorder with anxiety: Secondary | ICD-10-CM | POA: Diagnosis not present

## 2021-01-08 DIAGNOSIS — D62 Acute posthemorrhagic anemia: Secondary | ICD-10-CM | POA: Diagnosis not present

## 2021-01-08 DIAGNOSIS — K219 Gastro-esophageal reflux disease without esophagitis: Secondary | ICD-10-CM | POA: Diagnosis not present

## 2021-01-08 DIAGNOSIS — M4856XD Collapsed vertebra, not elsewhere classified, lumbar region, subsequent encounter for fracture with routine healing: Secondary | ICD-10-CM | POA: Diagnosis not present

## 2021-01-08 DIAGNOSIS — S72142D Displaced intertrochanteric fracture of left femur, subsequent encounter for closed fracture with routine healing: Secondary | ICD-10-CM | POA: Diagnosis not present

## 2021-01-08 DIAGNOSIS — U071 COVID-19: Secondary | ICD-10-CM | POA: Diagnosis not present

## 2021-01-10 DIAGNOSIS — Z9181 History of falling: Secondary | ICD-10-CM | POA: Diagnosis not present

## 2021-01-10 DIAGNOSIS — Z86711 Personal history of pulmonary embolism: Secondary | ICD-10-CM | POA: Diagnosis not present

## 2021-01-10 DIAGNOSIS — Z87440 Personal history of urinary (tract) infections: Secondary | ICD-10-CM | POA: Diagnosis not present

## 2021-01-10 DIAGNOSIS — Z86718 Personal history of other venous thrombosis and embolism: Secondary | ICD-10-CM | POA: Diagnosis not present

## 2021-01-10 DIAGNOSIS — D62 Acute posthemorrhagic anemia: Secondary | ICD-10-CM | POA: Diagnosis not present

## 2021-01-10 DIAGNOSIS — Z7901 Long term (current) use of anticoagulants: Secondary | ICD-10-CM | POA: Diagnosis not present

## 2021-01-10 DIAGNOSIS — S72142D Displaced intertrochanteric fracture of left femur, subsequent encounter for closed fracture with routine healing: Secondary | ICD-10-CM | POA: Diagnosis not present

## 2021-01-10 DIAGNOSIS — M4856XD Collapsed vertebra, not elsewhere classified, lumbar region, subsequent encounter for fracture with routine healing: Secondary | ICD-10-CM | POA: Diagnosis not present

## 2021-01-10 DIAGNOSIS — F4322 Adjustment disorder with anxiety: Secondary | ICD-10-CM | POA: Diagnosis not present

## 2021-01-10 DIAGNOSIS — U071 COVID-19: Secondary | ICD-10-CM | POA: Diagnosis not present

## 2021-01-10 DIAGNOSIS — K219 Gastro-esophageal reflux disease without esophagitis: Secondary | ICD-10-CM | POA: Diagnosis not present

## 2021-01-15 DIAGNOSIS — M4856XD Collapsed vertebra, not elsewhere classified, lumbar region, subsequent encounter for fracture with routine healing: Secondary | ICD-10-CM | POA: Diagnosis not present

## 2021-01-15 DIAGNOSIS — S72142D Displaced intertrochanteric fracture of left femur, subsequent encounter for closed fracture with routine healing: Secondary | ICD-10-CM | POA: Diagnosis not present

## 2021-01-15 DIAGNOSIS — U071 COVID-19: Secondary | ICD-10-CM | POA: Diagnosis not present

## 2021-01-15 DIAGNOSIS — F4322 Adjustment disorder with anxiety: Secondary | ICD-10-CM | POA: Diagnosis not present

## 2021-01-15 DIAGNOSIS — D62 Acute posthemorrhagic anemia: Secondary | ICD-10-CM | POA: Diagnosis not present

## 2021-01-15 DIAGNOSIS — K219 Gastro-esophageal reflux disease without esophagitis: Secondary | ICD-10-CM | POA: Diagnosis not present

## 2021-01-29 ENCOUNTER — Other Ambulatory Visit: Payer: Self-pay | Admitting: Oncology

## 2021-01-29 DIAGNOSIS — M4856XD Collapsed vertebra, not elsewhere classified, lumbar region, subsequent encounter for fracture with routine healing: Secondary | ICD-10-CM | POA: Diagnosis not present

## 2021-01-29 DIAGNOSIS — K219 Gastro-esophageal reflux disease without esophagitis: Secondary | ICD-10-CM | POA: Diagnosis not present

## 2021-01-29 DIAGNOSIS — S72142D Displaced intertrochanteric fracture of left femur, subsequent encounter for closed fracture with routine healing: Secondary | ICD-10-CM | POA: Diagnosis not present

## 2021-01-29 DIAGNOSIS — D62 Acute posthemorrhagic anemia: Secondary | ICD-10-CM | POA: Diagnosis not present

## 2021-01-29 DIAGNOSIS — F4322 Adjustment disorder with anxiety: Secondary | ICD-10-CM | POA: Diagnosis not present

## 2021-01-29 DIAGNOSIS — U071 COVID-19: Secondary | ICD-10-CM | POA: Diagnosis not present

## 2021-01-29 DIAGNOSIS — I82409 Acute embolism and thrombosis of unspecified deep veins of unspecified lower extremity: Secondary | ICD-10-CM

## 2021-01-31 DIAGNOSIS — D62 Acute posthemorrhagic anemia: Secondary | ICD-10-CM | POA: Diagnosis not present

## 2021-01-31 DIAGNOSIS — K219 Gastro-esophageal reflux disease without esophagitis: Secondary | ICD-10-CM | POA: Diagnosis not present

## 2021-01-31 DIAGNOSIS — M4856XD Collapsed vertebra, not elsewhere classified, lumbar region, subsequent encounter for fracture with routine healing: Secondary | ICD-10-CM | POA: Diagnosis not present

## 2021-01-31 DIAGNOSIS — F4322 Adjustment disorder with anxiety: Secondary | ICD-10-CM | POA: Diagnosis not present

## 2021-01-31 DIAGNOSIS — S72142D Displaced intertrochanteric fracture of left femur, subsequent encounter for closed fracture with routine healing: Secondary | ICD-10-CM | POA: Diagnosis not present

## 2021-01-31 DIAGNOSIS — U071 COVID-19: Secondary | ICD-10-CM | POA: Diagnosis not present

## 2021-02-02 IMAGING — RF DG HIP (WITH PELVIS) OPERATIVE*L*
1 series · 2 of 2 positions shown · non-contrast
Comparison: 07/22/2020

CLINICAL DATA: Fracture

EXAM:
DG C-ARM 1-60 MIN; OPERATIVE LEFT HIP WITH PELVIS

[Series 1: run · 2 of 2 slices shown]
[im 1/2]
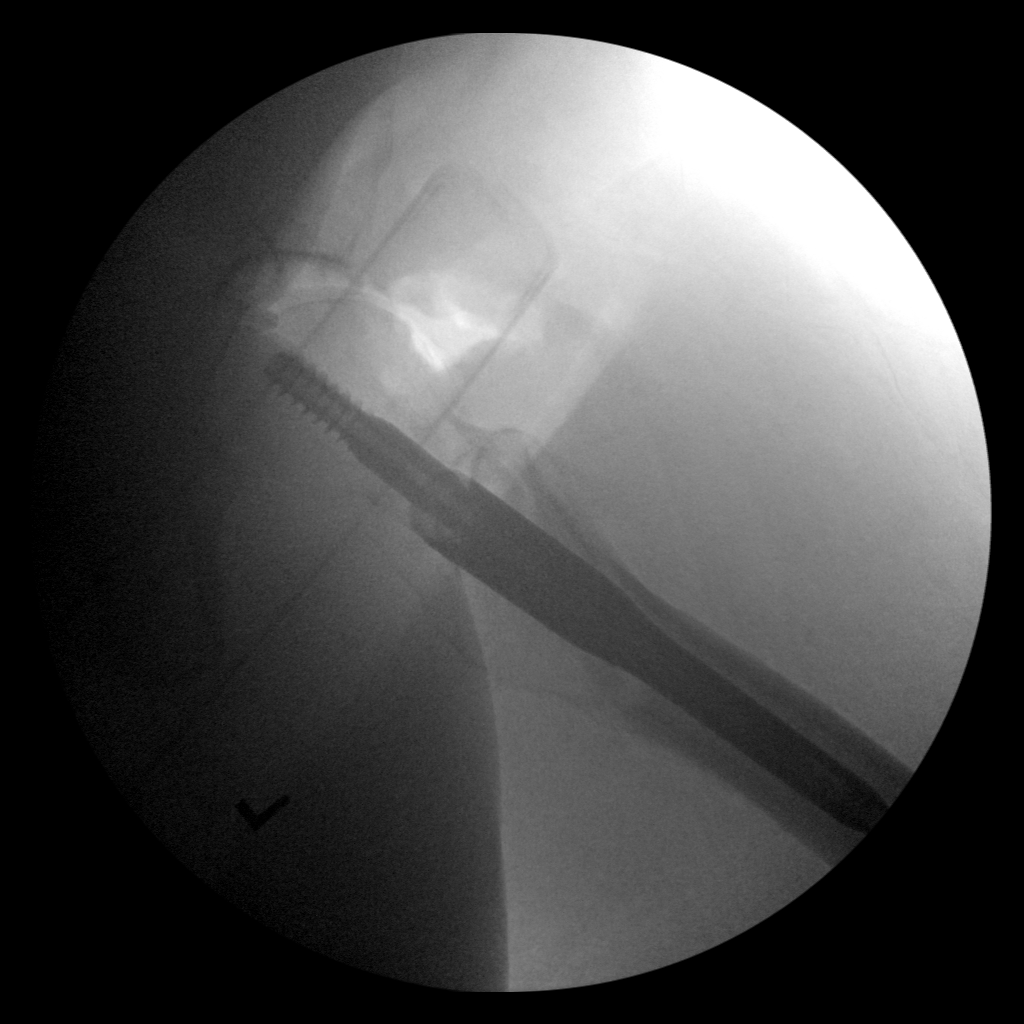
[im 2/2]
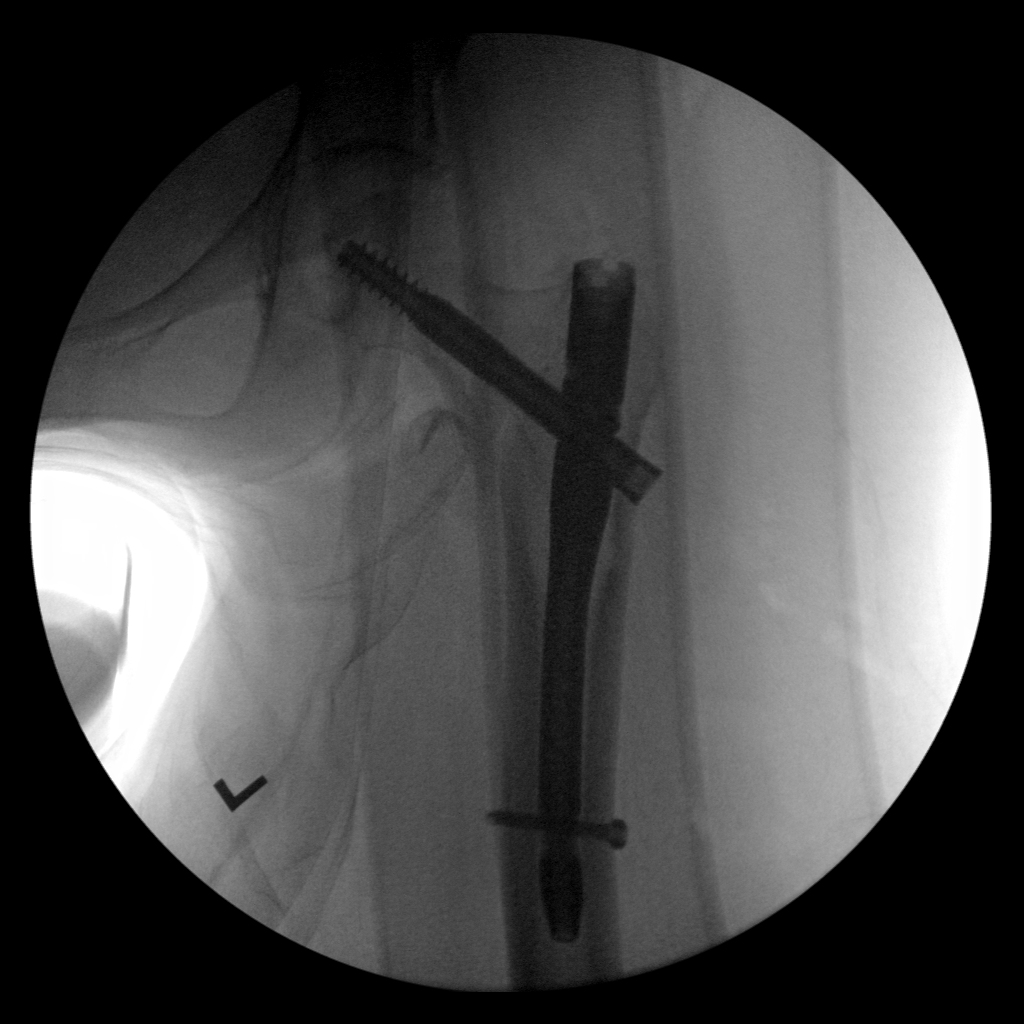

[2 of 2 positions shown; findings below may reference images not displayed]

FLUOROSCOPY TIME:  Fluoroscopy Time:  1 minutes 21 seconds

Radiation Exposure Index (if provided by the fluoroscopic device):
Not available

Number of Acquired Spot Images: 2
FINDINGS: Medullary rod and proximal fixation screw are noted. Fracture
fragments are in near anatomic alignment.
IMPRESSION: ORIF of left femoral fracture.

## 2021-02-05 ENCOUNTER — Telehealth: Payer: Self-pay

## 2021-02-05 ENCOUNTER — Other Ambulatory Visit: Payer: Self-pay

## 2021-02-05 DIAGNOSIS — U071 COVID-19: Secondary | ICD-10-CM | POA: Diagnosis not present

## 2021-02-05 DIAGNOSIS — K219 Gastro-esophageal reflux disease without esophagitis: Secondary | ICD-10-CM | POA: Diagnosis not present

## 2021-02-05 DIAGNOSIS — F4322 Adjustment disorder with anxiety: Secondary | ICD-10-CM | POA: Diagnosis not present

## 2021-02-05 DIAGNOSIS — D62 Acute posthemorrhagic anemia: Secondary | ICD-10-CM | POA: Diagnosis not present

## 2021-02-05 DIAGNOSIS — I82409 Acute embolism and thrombosis of unspecified deep veins of unspecified lower extremity: Secondary | ICD-10-CM

## 2021-02-05 DIAGNOSIS — M4856XD Collapsed vertebra, not elsewhere classified, lumbar region, subsequent encounter for fracture with routine healing: Secondary | ICD-10-CM | POA: Diagnosis not present

## 2021-02-05 DIAGNOSIS — S72142D Displaced intertrochanteric fracture of left femur, subsequent encounter for closed fracture with routine healing: Secondary | ICD-10-CM | POA: Diagnosis not present

## 2021-02-05 MED ORDER — RIVAROXABAN 20 MG PO TABS: 20.0000 mg | ORAL_TABLET | Freq: Every day | ORAL | 6 refills | Status: DC

## 2021-02-05 NOTE — Telephone Encounter (Signed)
Received call from pt's spouse Mr. Livingstone requesting Xarelto Rx with refills so they don't have to call the office every month to get a refill. Per Dr. Alen Blew: okay to order 6 refills. Rx sent to pharmacy with verbal confirmation of receipt. Mr. Hester made aware of new order and was agreeable.

## 2021-02-07 DIAGNOSIS — K219 Gastro-esophageal reflux disease without esophagitis: Secondary | ICD-10-CM | POA: Diagnosis not present

## 2021-02-07 DIAGNOSIS — S72142D Displaced intertrochanteric fracture of left femur, subsequent encounter for closed fracture with routine healing: Secondary | ICD-10-CM | POA: Diagnosis not present

## 2021-02-07 DIAGNOSIS — M4856XD Collapsed vertebra, not elsewhere classified, lumbar region, subsequent encounter for fracture with routine healing: Secondary | ICD-10-CM | POA: Diagnosis not present

## 2021-02-07 DIAGNOSIS — F4322 Adjustment disorder with anxiety: Secondary | ICD-10-CM | POA: Diagnosis not present

## 2021-02-07 DIAGNOSIS — U071 COVID-19: Secondary | ICD-10-CM | POA: Diagnosis not present

## 2021-02-07 DIAGNOSIS — D62 Acute posthemorrhagic anemia: Secondary | ICD-10-CM | POA: Diagnosis not present

## 2021-02-22 IMAGING — CR DG THORACIC SPINE 2V
2 series · 2 of 2 positions shown · non-contrast
Comparison: None.

CLINICAL DATA: Back pain

EXAM:
THORACIC SPINE 2 VIEWS

[t-spine ap]
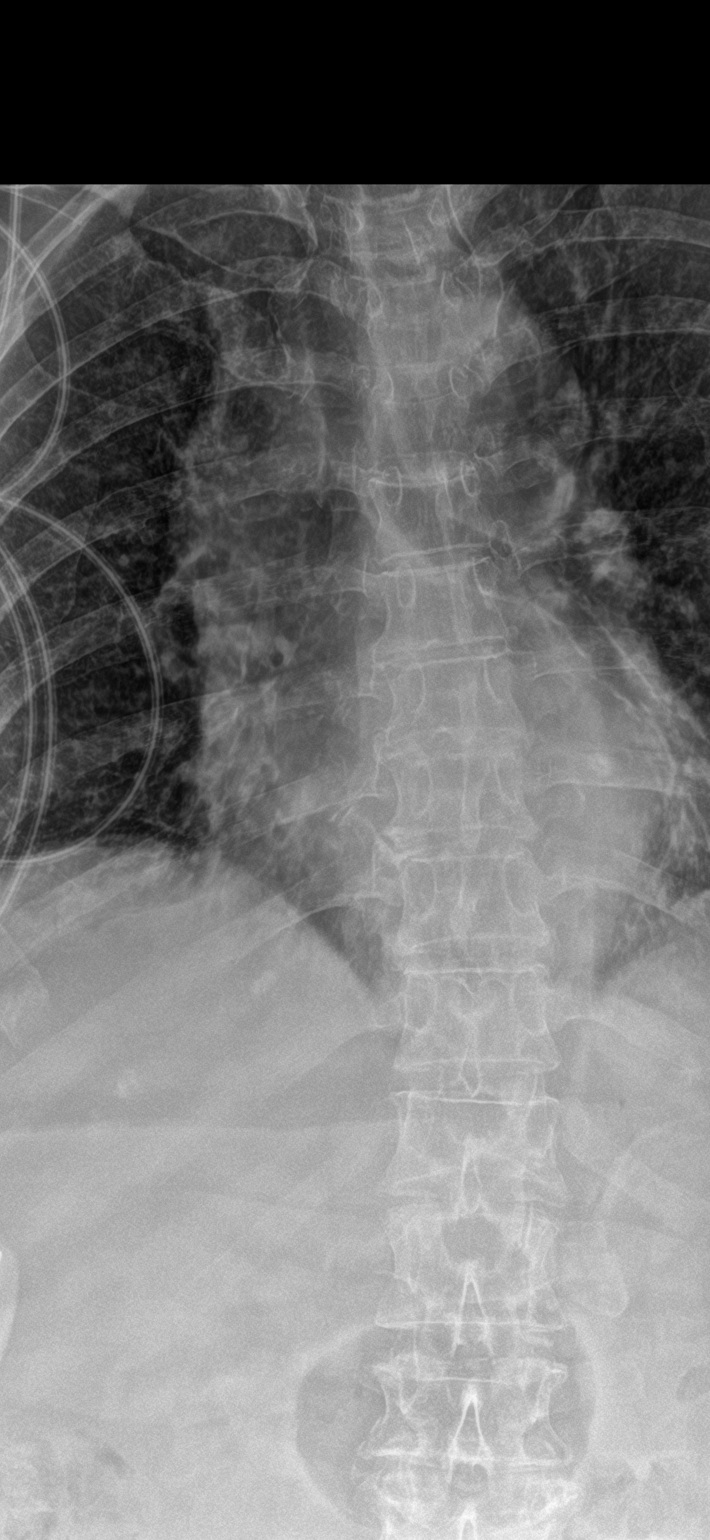

[t-spine lat]
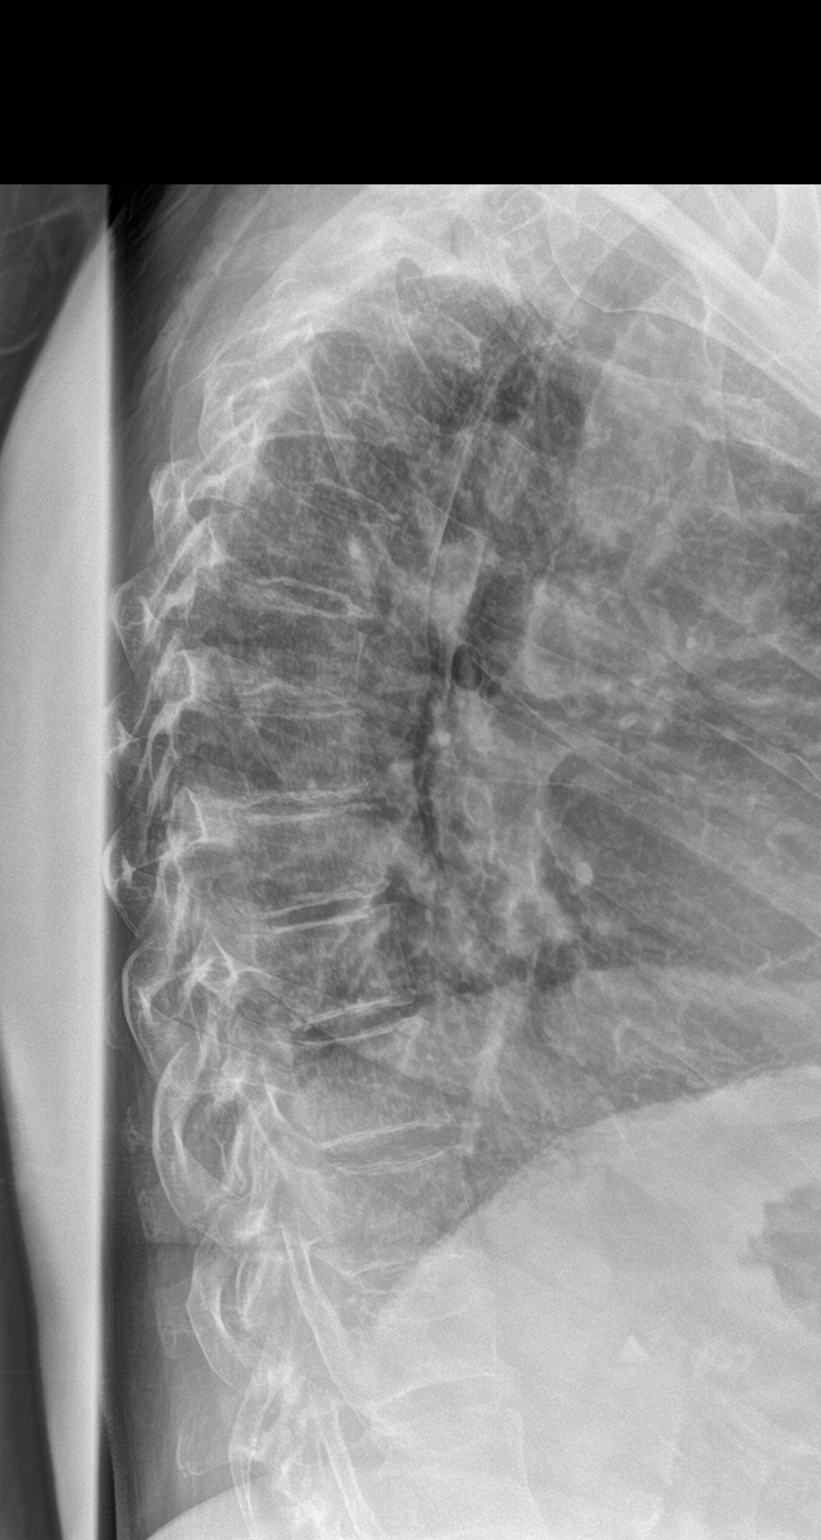

[2 of 2 positions shown; findings below may reference images not displayed]

FINDINGS: There is no evidence of thoracic spine fracture. Alignment is
normal. No other significant bone abnormalities are identified.
IMPRESSION: Negative.

## 2021-02-23 ENCOUNTER — Other Ambulatory Visit: Payer: Self-pay | Admitting: Oncology

## 2021-02-23 DIAGNOSIS — I82409 Acute embolism and thrombosis of unspecified deep veins of unspecified lower extremity: Secondary | ICD-10-CM

## 2021-04-10 DIAGNOSIS — S32039D Unspecified fracture of third lumbar vertebra, subsequent encounter for fracture with routine healing: Secondary | ICD-10-CM | POA: Diagnosis not present

## 2021-04-10 DIAGNOSIS — E785 Hyperlipidemia, unspecified: Secondary | ICD-10-CM | POA: Diagnosis not present

## 2021-04-10 DIAGNOSIS — M8588 Other specified disorders of bone density and structure, other site: Secondary | ICD-10-CM | POA: Diagnosis not present

## 2021-04-10 DIAGNOSIS — Z86711 Personal history of pulmonary embolism: Secondary | ICD-10-CM | POA: Diagnosis not present

## 2021-04-10 DIAGNOSIS — R7309 Other abnormal glucose: Secondary | ICD-10-CM | POA: Diagnosis not present

## 2021-04-10 DIAGNOSIS — E559 Vitamin D deficiency, unspecified: Secondary | ICD-10-CM | POA: Diagnosis not present

## 2021-04-10 DIAGNOSIS — K219 Gastro-esophageal reflux disease without esophagitis: Secondary | ICD-10-CM | POA: Diagnosis not present

## 2021-04-10 DIAGNOSIS — D649 Anemia, unspecified: Secondary | ICD-10-CM | POA: Diagnosis not present

## 2021-04-30 ENCOUNTER — Other Ambulatory Visit: Payer: Self-pay | Admitting: Internal Medicine

## 2021-04-30 DIAGNOSIS — D7589 Other specified diseases of blood and blood-forming organs: Secondary | ICD-10-CM | POA: Diagnosis not present

## 2021-04-30 DIAGNOSIS — R29890 Loss of height: Secondary | ICD-10-CM | POA: Diagnosis not present

## 2021-04-30 DIAGNOSIS — K219 Gastro-esophageal reflux disease without esophagitis: Secondary | ICD-10-CM | POA: Diagnosis not present

## 2021-04-30 DIAGNOSIS — Z79899 Other long term (current) drug therapy: Secondary | ICD-10-CM | POA: Diagnosis not present

## 2021-04-30 DIAGNOSIS — Z8349 Family history of other endocrine, nutritional and metabolic diseases: Secondary | ICD-10-CM | POA: Diagnosis not present

## 2021-04-30 DIAGNOSIS — M8000XA Age-related osteoporosis with current pathological fracture, unspecified site, initial encounter for fracture: Secondary | ICD-10-CM | POA: Diagnosis not present

## 2021-04-30 DIAGNOSIS — Z8639 Personal history of other endocrine, nutritional and metabolic disease: Secondary | ICD-10-CM | POA: Diagnosis not present

## 2021-09-11 DIAGNOSIS — Z23 Encounter for immunization: Secondary | ICD-10-CM | POA: Diagnosis not present

## 2021-09-24 ENCOUNTER — Other Ambulatory Visit: Payer: Self-pay | Admitting: Oncology

## 2021-09-24 DIAGNOSIS — I82409 Acute embolism and thrombosis of unspecified deep veins of unspecified lower extremity: Secondary | ICD-10-CM

## 2021-10-07 DIAGNOSIS — E785 Hyperlipidemia, unspecified: Secondary | ICD-10-CM | POA: Diagnosis not present

## 2021-10-07 DIAGNOSIS — Z0000 Encounter for general adult medical examination without abnormal findings: Secondary | ICD-10-CM | POA: Diagnosis not present

## 2021-10-07 DIAGNOSIS — Z86711 Personal history of pulmonary embolism: Secondary | ICD-10-CM | POA: Diagnosis not present

## 2021-10-07 DIAGNOSIS — R7309 Other abnormal glucose: Secondary | ICD-10-CM | POA: Diagnosis not present

## 2021-10-07 DIAGNOSIS — M8588 Other specified disorders of bone density and structure, other site: Secondary | ICD-10-CM | POA: Diagnosis not present

## 2021-10-10 ENCOUNTER — Ambulatory Visit
Admission: RE | Admit: 2021-10-10 | Discharge: 2021-10-10 | Disposition: A | Discharge status: Home / Self Care | Payer: Medicare Other | Source: Ambulatory Visit | Attending: Internal Medicine | Admitting: Internal Medicine

## 2021-10-10 ENCOUNTER — Other Ambulatory Visit: Payer: Self-pay

## 2021-10-10 DIAGNOSIS — M8000XA Age-related osteoporosis with current pathological fracture, unspecified site, initial encounter for fracture: Secondary | ICD-10-CM

## 2021-10-10 DIAGNOSIS — M85832 Other specified disorders of bone density and structure, left forearm: Secondary | ICD-10-CM | POA: Diagnosis not present

## 2021-10-10 DIAGNOSIS — M85851 Other specified disorders of bone density and structure, right thigh: Secondary | ICD-10-CM | POA: Diagnosis not present

## 2021-10-16 DIAGNOSIS — R29890 Loss of height: Secondary | ICD-10-CM | POA: Diagnosis not present

## 2021-10-16 DIAGNOSIS — K219 Gastro-esophageal reflux disease without esophagitis: Secondary | ICD-10-CM | POA: Diagnosis not present

## 2021-10-16 DIAGNOSIS — M810 Age-related osteoporosis without current pathological fracture: Secondary | ICD-10-CM | POA: Diagnosis not present

## 2021-10-16 DIAGNOSIS — D7589 Other specified diseases of blood and blood-forming organs: Secondary | ICD-10-CM | POA: Diagnosis not present

## 2021-10-16 DIAGNOSIS — Z86711 Personal history of pulmonary embolism: Secondary | ICD-10-CM | POA: Diagnosis not present

## 2021-10-16 DIAGNOSIS — Z8639 Personal history of other endocrine, nutritional and metabolic disease: Secondary | ICD-10-CM | POA: Diagnosis not present

## 2021-10-16 DIAGNOSIS — Z8349 Family history of other endocrine, nutritional and metabolic diseases: Secondary | ICD-10-CM | POA: Diagnosis not present

## 2021-10-30 DIAGNOSIS — M810 Age-related osteoporosis without current pathological fracture: Secondary | ICD-10-CM | POA: Diagnosis not present

## 2022-03-25 ENCOUNTER — Telehealth: Payer: Self-pay | Admitting: *Deleted

## 2022-03-25 NOTE — Telephone Encounter (Signed)
Request of Release for Treatment from Medical Doctor received for this patient for multiple tooth extractions - signed by Dr. Alen Blew, he advises to hold xarelto for 2 days before procedure.  Form faxed to [facility] [phone]) - fax confirmation received.  ?

## 2022-04-09 DIAGNOSIS — R7309 Other abnormal glucose: Secondary | ICD-10-CM | POA: Diagnosis not present

## 2022-04-09 DIAGNOSIS — Z86711 Personal history of pulmonary embolism: Secondary | ICD-10-CM | POA: Diagnosis not present

## 2022-04-09 DIAGNOSIS — K219 Gastro-esophageal reflux disease without esophagitis: Secondary | ICD-10-CM | POA: Diagnosis not present

## 2022-04-09 DIAGNOSIS — Z5181 Encounter for therapeutic drug level monitoring: Secondary | ICD-10-CM | POA: Diagnosis not present

## 2022-04-09 DIAGNOSIS — E559 Vitamin D deficiency, unspecified: Secondary | ICD-10-CM | POA: Diagnosis not present

## 2022-04-09 DIAGNOSIS — M8580 Other specified disorders of bone density and structure, unspecified site: Secondary | ICD-10-CM | POA: Diagnosis not present

## 2022-04-09 DIAGNOSIS — E785 Hyperlipidemia, unspecified: Secondary | ICD-10-CM | POA: Diagnosis not present

## 2022-04-15 ENCOUNTER — Other Ambulatory Visit: Payer: Self-pay | Admitting: Oncology

## 2022-04-15 DIAGNOSIS — I82409 Acute embolism and thrombosis of unspecified deep veins of unspecified lower extremity: Secondary | ICD-10-CM

## 2022-10-13 DIAGNOSIS — Z0000 Encounter for general adult medical examination without abnormal findings: Secondary | ICD-10-CM | POA: Diagnosis not present

## 2022-10-13 DIAGNOSIS — E785 Hyperlipidemia, unspecified: Secondary | ICD-10-CM | POA: Diagnosis not present

## 2022-10-13 DIAGNOSIS — R000 Tachycardia, unspecified: Secondary | ICD-10-CM | POA: Diagnosis not present

## 2022-10-13 DIAGNOSIS — R7309 Other abnormal glucose: Secondary | ICD-10-CM | POA: Diagnosis not present

## 2022-10-13 DIAGNOSIS — Z86711 Personal history of pulmonary embolism: Secondary | ICD-10-CM | POA: Diagnosis not present

## 2022-10-13 DIAGNOSIS — M8580 Other specified disorders of bone density and structure, unspecified site: Secondary | ICD-10-CM | POA: Diagnosis not present

## 2022-10-13 DIAGNOSIS — M6530 Trigger finger, unspecified finger: Secondary | ICD-10-CM | POA: Diagnosis not present

## 2022-10-13 DIAGNOSIS — Z23 Encounter for immunization: Secondary | ICD-10-CM | POA: Diagnosis not present

## 2022-10-17 DIAGNOSIS — Z8639 Personal history of other endocrine, nutritional and metabolic disease: Secondary | ICD-10-CM | POA: Diagnosis not present

## 2022-10-17 DIAGNOSIS — Z86711 Personal history of pulmonary embolism: Secondary | ICD-10-CM | POA: Diagnosis not present

## 2022-10-17 DIAGNOSIS — M810 Age-related osteoporosis without current pathological fracture: Secondary | ICD-10-CM | POA: Diagnosis not present

## 2022-10-17 DIAGNOSIS — K219 Gastro-esophageal reflux disease without esophagitis: Secondary | ICD-10-CM | POA: Diagnosis not present

## 2022-11-21 DIAGNOSIS — M810 Age-related osteoporosis without current pathological fracture: Secondary | ICD-10-CM | POA: Diagnosis not present

## 2023-01-05 ENCOUNTER — Telehealth: Payer: Self-pay

## 2023-01-05 NOTE — Telephone Encounter (Signed)
T/C from pt's husband stating pt's PCP, Dr Orland Mustard, will be monitoring pt and her Xarelto.

## 2023-04-15 DIAGNOSIS — Z86711 Personal history of pulmonary embolism: Secondary | ICD-10-CM | POA: Diagnosis not present

## 2023-04-15 DIAGNOSIS — E785 Hyperlipidemia, unspecified: Secondary | ICD-10-CM | POA: Diagnosis not present

## 2023-04-15 DIAGNOSIS — Z5181 Encounter for therapeutic drug level monitoring: Secondary | ICD-10-CM | POA: Diagnosis not present

## 2023-04-15 DIAGNOSIS — M8580 Other specified disorders of bone density and structure, unspecified site: Secondary | ICD-10-CM | POA: Diagnosis not present

## 2023-04-15 DIAGNOSIS — K219 Gastro-esophageal reflux disease without esophagitis: Secondary | ICD-10-CM | POA: Diagnosis not present

## 2023-04-15 DIAGNOSIS — E039 Hypothyroidism, unspecified: Secondary | ICD-10-CM | POA: Diagnosis not present

## 2023-04-15 DIAGNOSIS — E559 Vitamin D deficiency, unspecified: Secondary | ICD-10-CM | POA: Diagnosis not present

## 2023-04-15 DIAGNOSIS — R7309 Other abnormal glucose: Secondary | ICD-10-CM | POA: Diagnosis not present

## 2023-10-20 DIAGNOSIS — K219 Gastro-esophageal reflux disease without esophagitis: Secondary | ICD-10-CM | POA: Diagnosis not present

## 2023-10-20 DIAGNOSIS — Z8639 Personal history of other endocrine, nutritional and metabolic disease: Secondary | ICD-10-CM | POA: Diagnosis not present

## 2023-10-20 DIAGNOSIS — Z86711 Personal history of pulmonary embolism: Secondary | ICD-10-CM | POA: Diagnosis not present

## 2023-10-20 DIAGNOSIS — M810 Age-related osteoporosis without current pathological fracture: Secondary | ICD-10-CM | POA: Diagnosis not present

## 2023-10-22 DIAGNOSIS — Z8639 Personal history of other endocrine, nutritional and metabolic disease: Secondary | ICD-10-CM | POA: Diagnosis not present

## 2023-10-22 DIAGNOSIS — M810 Age-related osteoporosis without current pathological fracture: Secondary | ICD-10-CM | POA: Diagnosis not present

## 2023-10-22 DIAGNOSIS — R000 Tachycardia, unspecified: Secondary | ICD-10-CM | POA: Diagnosis not present

## 2023-10-22 DIAGNOSIS — R7309 Other abnormal glucose: Secondary | ICD-10-CM | POA: Diagnosis not present

## 2023-10-22 DIAGNOSIS — Z23 Encounter for immunization: Secondary | ICD-10-CM | POA: Diagnosis not present

## 2023-10-22 DIAGNOSIS — Z0000 Encounter for general adult medical examination without abnormal findings: Secondary | ICD-10-CM | POA: Diagnosis not present

## 2023-10-22 DIAGNOSIS — E785 Hyperlipidemia, unspecified: Secondary | ICD-10-CM | POA: Diagnosis not present

## 2023-10-22 DIAGNOSIS — Z86711 Personal history of pulmonary embolism: Secondary | ICD-10-CM | POA: Diagnosis not present

## 2023-11-19 ENCOUNTER — Other Ambulatory Visit: Payer: Self-pay

## 2023-11-19 ENCOUNTER — Emergency Department (HOSPITAL_COMMUNITY): Payer: Medicare Other

## 2023-11-19 ENCOUNTER — Inpatient Hospital Stay (HOSPITAL_COMMUNITY)
Admission: EM | Admit: 2023-11-19 | Discharge: 2023-11-24 | DRG: 309 | Disposition: A | Discharge status: Skilled Nursing Facility | Payer: Medicare Other | Attending: Internal Medicine | Admitting: Internal Medicine

## 2023-11-19 DIAGNOSIS — I2489 Other forms of acute ischemic heart disease: Secondary | ICD-10-CM | POA: Diagnosis present

## 2023-11-19 DIAGNOSIS — D62 Acute posthemorrhagic anemia: Secondary | ICD-10-CM | POA: Diagnosis not present

## 2023-11-19 DIAGNOSIS — M6282 Rhabdomyolysis: Secondary | ICD-10-CM

## 2023-11-19 DIAGNOSIS — Z79899 Other long term (current) drug therapy: Secondary | ICD-10-CM | POA: Diagnosis not present

## 2023-11-19 DIAGNOSIS — D72829 Elevated white blood cell count, unspecified: Secondary | ICD-10-CM | POA: Diagnosis present

## 2023-11-19 DIAGNOSIS — W19XXXA Unspecified fall, initial encounter: Secondary | ICD-10-CM | POA: Diagnosis present

## 2023-11-19 DIAGNOSIS — I959 Hypotension, unspecified: Secondary | ICD-10-CM | POA: Diagnosis present

## 2023-11-19 DIAGNOSIS — Z87898 Personal history of other specified conditions: Secondary | ICD-10-CM | POA: Diagnosis not present

## 2023-11-19 DIAGNOSIS — E0781 Sick-euthyroid syndrome: Secondary | ICD-10-CM | POA: Diagnosis present

## 2023-11-19 DIAGNOSIS — I6782 Cerebral ischemia: Secondary | ICD-10-CM | POA: Diagnosis not present

## 2023-11-19 DIAGNOSIS — K449 Diaphragmatic hernia without obstruction or gangrene: Secondary | ICD-10-CM | POA: Diagnosis not present

## 2023-11-19 DIAGNOSIS — N390 Urinary tract infection, site not specified: Secondary | ICD-10-CM | POA: Diagnosis not present

## 2023-11-19 DIAGNOSIS — R7989 Other specified abnormal findings of blood chemistry: Secondary | ICD-10-CM | POA: Diagnosis not present

## 2023-11-19 DIAGNOSIS — M545 Low back pain, unspecified: Secondary | ICD-10-CM | POA: Diagnosis not present

## 2023-11-19 DIAGNOSIS — G8918 Other acute postprocedural pain: Secondary | ICD-10-CM | POA: Diagnosis not present

## 2023-11-19 DIAGNOSIS — R278 Other lack of coordination: Secondary | ICD-10-CM | POA: Diagnosis not present

## 2023-11-19 DIAGNOSIS — E861 Hypovolemia: Secondary | ICD-10-CM

## 2023-11-19 DIAGNOSIS — Z6838 Body mass index (BMI) 38.0-38.9, adult: Secondary | ICD-10-CM | POA: Diagnosis not present

## 2023-11-19 DIAGNOSIS — W19XXXS Unspecified fall, sequela: Secondary | ICD-10-CM | POA: Diagnosis not present

## 2023-11-19 DIAGNOSIS — I480 Paroxysmal atrial fibrillation: Secondary | ICD-10-CM | POA: Diagnosis present

## 2023-11-19 DIAGNOSIS — Z882 Allergy status to sulfonamides status: Secondary | ICD-10-CM

## 2023-11-19 DIAGNOSIS — K219 Gastro-esophageal reflux disease without esophagitis: Secondary | ICD-10-CM | POA: Diagnosis present

## 2023-11-19 DIAGNOSIS — R0902 Hypoxemia: Secondary | ICD-10-CM | POA: Diagnosis present

## 2023-11-19 DIAGNOSIS — N281 Cyst of kidney, acquired: Secondary | ICD-10-CM | POA: Diagnosis not present

## 2023-11-19 DIAGNOSIS — N179 Acute kidney failure, unspecified: Secondary | ICD-10-CM | POA: Diagnosis present

## 2023-11-19 DIAGNOSIS — I10 Essential (primary) hypertension: Secondary | ICD-10-CM | POA: Diagnosis not present

## 2023-11-19 DIAGNOSIS — M549 Dorsalgia, unspecified: Secondary | ICD-10-CM | POA: Diagnosis not present

## 2023-11-19 DIAGNOSIS — S72009D Fracture of unspecified part of neck of unspecified femur, subsequent encounter for closed fracture with routine healing: Secondary | ICD-10-CM | POA: Diagnosis not present

## 2023-11-19 DIAGNOSIS — R140 Abdominal distension (gaseous): Secondary | ICD-10-CM | POA: Diagnosis not present

## 2023-11-19 DIAGNOSIS — R000 Tachycardia, unspecified: Secondary | ICD-10-CM | POA: Diagnosis not present

## 2023-11-19 DIAGNOSIS — E871 Hypo-osmolality and hyponatremia: Secondary | ICD-10-CM | POA: Diagnosis present

## 2023-11-19 DIAGNOSIS — Z86718 Personal history of other venous thrombosis and embolism: Secondary | ICD-10-CM

## 2023-11-19 DIAGNOSIS — M810 Age-related osteoporosis without current pathological fracture: Secondary | ICD-10-CM | POA: Diagnosis present

## 2023-11-19 DIAGNOSIS — R110 Nausea: Secondary | ICD-10-CM

## 2023-11-19 DIAGNOSIS — Z9181 History of falling: Secondary | ICD-10-CM

## 2023-11-19 DIAGNOSIS — Z7901 Long term (current) use of anticoagulants: Secondary | ICD-10-CM | POA: Diagnosis not present

## 2023-11-19 DIAGNOSIS — Z9981 Dependence on supplemental oxygen: Secondary | ICD-10-CM | POA: Diagnosis not present

## 2023-11-19 DIAGNOSIS — K838 Other specified diseases of biliary tract: Secondary | ICD-10-CM | POA: Diagnosis not present

## 2023-11-19 DIAGNOSIS — R54 Age-related physical debility: Secondary | ICD-10-CM | POA: Diagnosis present

## 2023-11-19 DIAGNOSIS — E669 Obesity, unspecified: Secondary | ICD-10-CM | POA: Diagnosis present

## 2023-11-19 DIAGNOSIS — Z7401 Bed confinement status: Secondary | ICD-10-CM | POA: Diagnosis not present

## 2023-11-19 DIAGNOSIS — R6510 Systemic inflammatory response syndrome (SIRS) of non-infectious origin without acute organ dysfunction: Secondary | ICD-10-CM | POA: Diagnosis present

## 2023-11-19 DIAGNOSIS — Z86711 Personal history of pulmonary embolism: Secondary | ICD-10-CM

## 2023-11-19 DIAGNOSIS — K5730 Diverticulosis of large intestine without perforation or abscess without bleeding: Secondary | ICD-10-CM | POA: Diagnosis not present

## 2023-11-19 DIAGNOSIS — I4891 Unspecified atrial fibrillation: Secondary | ICD-10-CM

## 2023-11-19 DIAGNOSIS — E8721 Acute metabolic acidosis: Secondary | ICD-10-CM | POA: Diagnosis present

## 2023-11-19 DIAGNOSIS — E860 Dehydration: Secondary | ICD-10-CM | POA: Diagnosis not present

## 2023-11-19 DIAGNOSIS — I771 Stricture of artery: Secondary | ICD-10-CM | POA: Diagnosis not present

## 2023-11-19 DIAGNOSIS — Z6836 Body mass index (BMI) 36.0-36.9, adult: Secondary | ICD-10-CM

## 2023-11-19 DIAGNOSIS — R112 Nausea with vomiting, unspecified: Secondary | ICD-10-CM | POA: Diagnosis not present

## 2023-11-19 DIAGNOSIS — R262 Difficulty in walking, not elsewhere classified: Secondary | ICD-10-CM | POA: Diagnosis not present

## 2023-11-19 DIAGNOSIS — S7292XD Unspecified fracture of left femur, subsequent encounter for closed fracture with routine healing: Secondary | ICD-10-CM | POA: Diagnosis not present

## 2023-11-19 DIAGNOSIS — J9601 Acute respiratory failure with hypoxia: Secondary | ICD-10-CM | POA: Diagnosis not present

## 2023-11-19 DIAGNOSIS — W19XXXD Unspecified fall, subsequent encounter: Secondary | ICD-10-CM | POA: Diagnosis not present

## 2023-11-19 DIAGNOSIS — F4322 Adjustment disorder with anxiety: Secondary | ICD-10-CM | POA: Diagnosis not present

## 2023-11-19 DIAGNOSIS — E46 Unspecified protein-calorie malnutrition: Secondary | ICD-10-CM | POA: Diagnosis not present

## 2023-11-19 DIAGNOSIS — S72009A Fracture of unspecified part of neck of unspecified femur, initial encounter for closed fracture: Secondary | ICD-10-CM | POA: Diagnosis present

## 2023-11-19 DIAGNOSIS — I5A Non-ischemic myocardial injury (non-traumatic): Secondary | ICD-10-CM

## 2023-11-19 DIAGNOSIS — E039 Hypothyroidism, unspecified: Secondary | ICD-10-CM | POA: Diagnosis not present

## 2023-11-19 DIAGNOSIS — R531 Weakness: Secondary | ICD-10-CM | POA: Diagnosis not present

## 2023-11-19 DIAGNOSIS — S0990XA Unspecified injury of head, initial encounter: Secondary | ICD-10-CM | POA: Diagnosis not present

## 2023-11-19 DIAGNOSIS — R41841 Cognitive communication deficit: Secondary | ICD-10-CM | POA: Diagnosis not present

## 2023-11-19 HISTORY — DX: Rhabdomyolysis: M62.82

## 2023-11-19 HISTORY — DX: Unspecified atrial fibrillation: I48.91

## 2023-11-19 LAB — TROPONIN I (HIGH SENSITIVITY)
Troponin I (High Sensitivity): 136 ng/L (ref <18)
Troponin I (High Sensitivity): 84 ng/L — ABNORMAL HIGH (ref <18)

## 2023-11-19 LAB — URINALYSIS, ROUTINE W REFLEX MICROSCOPIC
Bacteria, UA: NONE SEEN
Bilirubin Urine: NEGATIVE
Glucose, UA: NEGATIVE mg/dL
Hgb urine dipstick: NEGATIVE
Ketones, ur: NEGATIVE mg/dL
Leukocytes,Ua: NEGATIVE
Nitrite: NEGATIVE
Protein, ur: NEGATIVE mg/dL
Specific Gravity, Urine: 1.028 (ref 1.005–1.030)
pH: 5 (ref 5.0–8.0)

## 2023-11-19 LAB — COMPREHENSIVE METABOLIC PANEL
ALT: 18 U/L (ref 0–44)
AST: 56 U/L — ABNORMAL HIGH (ref 15–41)
Albumin: 2.8 g/dL — ABNORMAL LOW (ref 3.5–5.0)
Alkaline Phosphatase: 50 U/L (ref 38–126)
Anion gap: 14 (ref 5–15)
BUN: 20 mg/dL (ref 8–23)
CO2: 18 mmol/L — ABNORMAL LOW (ref 22–32)
Calcium: 9.1 mg/dL (ref 8.9–10.3)
Chloride: 99 mmol/L (ref 98–111)
Creatinine, Ser: 1.3 mg/dL — ABNORMAL HIGH (ref 0.44–1.00)
GFR, Estimated: 40 mL/min — ABNORMAL LOW (ref >60)
Glucose, Bld: 113 mg/dL — ABNORMAL HIGH (ref 70–99)
Potassium: 3.6 mmol/L (ref 3.5–5.1)
Sodium: 131 mmol/L — ABNORMAL LOW (ref 135–145)
Total Bilirubin: 1.8 mg/dL — ABNORMAL HIGH (ref <1.2)
Total Protein: 5.9 g/dL — ABNORMAL LOW (ref 6.5–8.1)

## 2023-11-19 LAB — CK: Total CK: 1741 U/L — ABNORMAL HIGH (ref 38–234)

## 2023-11-19 LAB — CBC
HCT: 43.9 % (ref 36.0–46.0)
Hemoglobin: 15 g/dL (ref 12.0–15.0)
MCH: 36.3 pg — ABNORMAL HIGH (ref 26.0–34.0)
MCHC: 34.2 g/dL (ref 30.0–36.0)
MCV: 106.3 fL — ABNORMAL HIGH (ref 80.0–100.0)
Platelets: 184 10*3/uL (ref 150–400)
RBC: 4.13 MIL/uL (ref 3.87–5.11)
RDW: 15.6 % — ABNORMAL HIGH (ref 11.5–15.5)
WBC: 19.4 10*3/uL — ABNORMAL HIGH (ref 4.0–10.5)
nRBC: 0 % (ref 0.0–0.2)

## 2023-11-19 LAB — LACTIC ACID, PLASMA
Lactic Acid, Venous: 2.6 mmol/L (ref 0.5–1.9)
Lactic Acid, Venous: 1.6 mmol/L (ref 0.5–1.9)
Lactic Acid, Venous: 1.9 mmol/L (ref 0.5–1.9)

## 2023-11-19 LAB — BRAIN NATRIURETIC PEPTIDE: B Natriuretic Peptide: 437.2 pg/mL — ABNORMAL HIGH (ref 0.0–100.0)

## 2023-11-19 LAB — LIPASE, BLOOD: Lipase: 19 U/L (ref 11–51)

## 2023-11-19 LAB — MRSA NEXT GEN BY PCR, NASAL: MRSA by PCR Next Gen: NOT DETECTED

## 2023-11-19 LAB — T4, FREE: Free T4: 1.69 ng/dL — ABNORMAL HIGH (ref 0.61–1.12)

## 2023-11-19 LAB — TSH: TSH: 4.847 u[IU]/mL — ABNORMAL HIGH (ref 0.350–4.500)

## 2023-11-19 LAB — MAGNESIUM
Magnesium: 0.8 mg/dL — CL (ref 1.7–2.4)
Magnesium: 1.5 mg/dL — ABNORMAL LOW (ref 1.7–2.4)

## 2023-11-19 LAB — PROCALCITONIN: Procalcitonin: 5.29 ng/mL

## 2023-11-19 MED ORDER — VANCOMYCIN HCL IN DEXTROSE 1-5 GM/200ML-% IV SOLN
1000.0000 mg | Freq: Once | INTRAVENOUS | Status: AC
  Administered 2023-11-19: 1000 mg via INTRAVENOUS
  Filled 2023-11-19: qty 200

## 2023-11-19 MED ORDER — VANCOMYCIN HCL 2000 MG/400ML IV SOLN
2000.0000 mg | Freq: Once | INTRAVENOUS | Status: DC
  Filled 2023-11-19: qty 400

## 2023-11-19 MED ORDER — SODIUM CHLORIDE 0.9 % IV BOLUS
1000.0000 mL | Freq: Once | INTRAVENOUS | Status: AC
  Administered 2023-11-19: 1000 mL via INTRAVENOUS

## 2023-11-19 MED ORDER — AMIODARONE HCL IN DEXTROSE 360-4.14 MG/200ML-% IV SOLN
60.0000 mg/h | INTRAVENOUS | Status: AC
  Administered 2023-11-19 (×2): 60 mg/h via INTRAVENOUS

## 2023-11-19 MED ORDER — DILTIAZEM HCL 25 MG/5ML IV SOLN
15.0000 mg | Freq: Once | INTRAVENOUS | Status: AC
  Administered 2023-11-19: 15 mg via INTRAVENOUS
  Filled 2023-11-19: qty 5

## 2023-11-19 MED ORDER — AMIODARONE HCL IN DEXTROSE 360-4.14 MG/200ML-% IV SOLN
30.0000 mg/h | INTRAVENOUS | Status: DC
  Administered 2023-11-20: 30 mg/h via INTRAVENOUS
  Filled 2023-11-19 (×3): qty 200

## 2023-11-19 MED ORDER — SODIUM CHLORIDE 0.9 % IV SOLN
2.0000 g | INTRAVENOUS | Status: DC
  Administered 2023-11-19: 2 g via INTRAVENOUS
  Filled 2023-11-19: qty 20

## 2023-11-19 MED ORDER — ALBUTEROL SULFATE (2.5 MG/3ML) 0.083% IN NEBU: 2.5000 mg | INHALATION_SOLUTION | Freq: Four times a day (QID) | RESPIRATORY_TRACT | Status: DC | PRN

## 2023-11-19 MED ORDER — AMIODARONE LOAD VIA INFUSION
150.0000 mg | Freq: Once | INTRAVENOUS | Status: AC
  Administered 2023-11-19: 150 mg via INTRAVENOUS
  Filled 2023-11-19: qty 83.34

## 2023-11-19 MED ORDER — IOHEXOL 350 MG/ML SOLN
75.0000 mL | Freq: Once | INTRAVENOUS | Status: AC | PRN
  Administered 2023-11-19: 75 mL via INTRAVENOUS

## 2023-11-19 MED ORDER — ACETAMINOPHEN 650 MG RE SUPP: 650.0000 mg | Freq: Four times a day (QID) | RECTAL | Status: DC | PRN

## 2023-11-19 MED ORDER — MAGNESIUM SULFATE 4 GM/100ML IV SOLN
4.0000 g | Freq: Once | INTRAVENOUS | Status: AC
  Administered 2023-11-19: 4 g via INTRAVENOUS
  Filled 2023-11-19: qty 100

## 2023-11-19 MED ORDER — SODIUM CHLORIDE 0.9% FLUSH
3.0000 mL | Freq: Two times a day (BID) | INTRAVENOUS | Status: DC
  Administered 2023-11-19 – 2023-11-24 (×11): 3 mL via INTRAVENOUS

## 2023-11-19 MED ORDER — SODIUM CHLORIDE 0.9 % IV BOLUS
500.0000 mL | Freq: Once | INTRAVENOUS | Status: AC
  Administered 2023-11-19: 500 mL via INTRAVENOUS

## 2023-11-19 MED ORDER — ONDANSETRON HCL 4 MG/2ML IJ SOLN
4.0000 mg | Freq: Once | INTRAMUSCULAR | Status: AC
  Administered 2023-11-19: 4 mg via INTRAVENOUS
  Filled 2023-11-19: qty 2

## 2023-11-19 MED ORDER — METOPROLOL TARTRATE 25 MG PO TABS: 12.5000 mg | ORAL_TABLET | Freq: Two times a day (BID) | ORAL | Status: DC

## 2023-11-19 MED ORDER — VANCOMYCIN HCL 750 MG/150ML IV SOLN: 750.0000 mg | INTRAVENOUS | Status: DC

## 2023-11-19 MED ORDER — ACETAMINOPHEN 325 MG PO TABS: 650.0000 mg | ORAL_TABLET | Freq: Four times a day (QID) | ORAL | Status: DC | PRN

## 2023-11-19 MED ORDER — SODIUM CHLORIDE 0.9 % IV SOLN: INTRAVENOUS | Status: DC

## 2023-11-19 NOTE — ED Triage Notes (Signed)
Pt presents to ED 35 via EMS from home with report of sliding from bed to floor around 2100 last night.  Pt's husband unable to help her off floor so she told him to leave her on floor.  Pt on floor until around 0400 this morning.  Pt now having lower back pain.  Pt uses walker at baseline.  Pt takes Xarelto but has not taken in roughly 48 hours.

## 2023-11-19 NOTE — ED Provider Notes (Signed)
Signout from Dr. Pilar Plate.  89 year old female who slid out of her wheelchair and was on the ground from last night.  She has also had ongoing nausea and not feeling well for the last 2 days.  She is in A-fib and has required some rate control.  Unclear if this A-fib is known although she is on a blood thinner.  She also has an elevated troponin.  She is pending CT imaging head and abdomen.  Blood pressure is little soft.  Will likely need admission to the hospital for further workup. Physical Exam  BP (!) 92/47   Pulse (!) 120   Temp 97.9 F (36.6 C) (Oral)   Resp (!) 21   Ht 5' 5.5" (1.664 m)   Wt 102.1 kg   SpO2 96%   BMI 36.89 kg/m   Physical Exam  Procedures  Procedures  ED Course / MDM    Medical Decision Making Amount and/or Complexity of Data Reviewed Labs: ordered. Radiology: ordered. ECG/medicine tests: ordered.  Risk Prescription drug management. Decision regarding hospitalization.   I updated patient on results of her testing and recommendation of her getting admitted to the hospital so we can continue to try to get her feeling better.  She is in agreement with plan.  Blood pressure still soft here getting IV fluids.  Heart rate around 100 right now.  Discussed with [facility] hospitalist Dr. Katrinka Blazing who will evaluate.  Patient for admission.  Dr. Pilar Plate also had consulted cardiology for their input on her atrial fibrillation       Terrilee Files, MD 11/19/23 [phone]

## 2023-11-19 NOTE — ED Notes (Signed)
Per lab, Magnesium 0.8, Katrinka Blazing, MD notified.

## 2023-11-19 NOTE — H&P (Signed)
History and Physical    Patient: Stacy Valencia KGM:[id number] DOB: 1933/07/23 DOA: 11/19/2023 DOS: the patient was seen and examined on 11/19/2023 PCP: Laurell Josephs, MD (Inactive)  Patient coming from: Home via EMS  Chief Complaint:  Chief Complaint  Patient presents with   Fall   HPI: Stacy Valencia is a 89 y.o. female with medical history significant of  paroxysmal atrial fibrillation, DVT/pulmonary embolism on chronic anticoagulation, anemia, urinary tract infection, anxiety, history of falls, osteoporosis, and GERD who presents after having a fall. She reports attempting to transfer from her walker to her wheelchair when she missed the edge and fell to the floor. She denies hitting her head or losing consciousness. The patient states she was unable to get up independently, and her husband was unable to help her necessitating a 911 call.  She reports feeling "pretty good" recently.  Denies having any palpitations, chest pain,  or leg swelling. The patient mentions experiencing nausea one evening about a week ago after eating, but this was an isolated incident and thought it was related to what she had eaten.  The patient has a history of atrial fibrillation, for which she takes metoprolol, though she states she doesn't "really need it." She also has a history of deep vein thrombosis in her left leg and pulmonary embolism, for which she is on Xarelto and reports taking as prescribed. The patient reports feeling good most of the time and denies any fever, recent lightheadedness, dizziness, stomach discomfort, or burning with urination. She mentions having a busy week recently, which made her feel tired. The patient also notes that she usually drinks a lot of water, but was instructed to reduce her intake for some recent tests, which may have contributed to her feeling dehydrated. It was reported that the patient stayed on the floor for some time.  In the emergency department patient was noted  to be afebrile with pulse elevated up to 170s in atrial fibrillation, respirations 12-25, blood pressures noted to be as low as 84/54, and O2 saturations as low as 86% with improvement on she.  Labs significant for WBC 19.4, sodium 131, BUN 20, creatinine 1.3, AST 56, total bilirubin 1.8, CK 1741, high-sensitivity troponin 136->84.  Chest x-ray noted no acute abnormality.  CT scan of the head, abdomen, and pelvis had been obtained which showed no acute abnormality.  Urinalysis was still pending.  Cardiology had been formally consulted.  Patient had been given diltiazem 15 mg and 0.5 L of normal saline IV fluids  Review of Systems: As mentioned in the history of present illness. All other systems reviewed and are negative. Past Medical History:  Diagnosis Date   GERD (gastroesophageal reflux disease)    PE (pulmonary embolism) 2005   Past Surgical History:  Procedure Laterality Date   ABDOMINAL HYSTERECTOMY     APPENDECTOMY     arthroscopic shoulder surgery Right 10/28/2016   decompression of SAD/DCR   INTRAMEDULLARY (IM) NAIL INTERTROCHANTERIC Left 07/23/2020   Procedure: INTRAMEDULLARY (IM) NAIL INTERTROCHANTRIC HIP;  Surgeon: Samson Frederic, MD;  Location: [facility] OR;  Service: Orthopedics;  Laterality: Left;   Social History:  reports that she has never smoked. She has never used smokeless tobacco. She reports that she does not drink alcohol and does not use drugs.  Allergies  Allergen Reactions   Sulfa Antibiotics Nausea And Vomiting    Family History  Problem Relation Age of Onset   Pulmonary embolism Neg Hx     Prior to Admission  medications   Medication Sig Start Date End Date Taking? Authorizing Provider  acetaminophen (TYLENOL) 325 MG tablet Take 1-2 tablets (325-650 mg total) by mouth every 4 (four) hours as needed for mild pain. 08/09/20  Yes Angiulli, Mcarthur Rossetti, PA-C  metoprolol tartrate (LOPRESSOR) 25 MG tablet Take 0.5 tablets (12.5 mg total) by mouth 2 (two) times daily.  08/09/20  Yes Angiulli, Mcarthur Rossetti, PA-C  XARELTO 20 MG TABS tablet TAKE 1 TABLET BY MOUTH EVERY DAY 04/15/22  Yes Benjiman Core, MD    Physical Exam: Vitals:   11/19/23 0715 11/19/23 0830 11/19/23 0845 11/19/23 0855  BP:  (!) 100/56 (!) 87/54 107/69  Pulse: 96 (!) 102 (!) 119 (!) 112  Resp: (!) 21 13 13 18   Temp:  98.1 F (36.7 C)    TempSrc:      SpO2: 95% 98% 95% 99%  Weight:      Height:       Exam  Constitutional: Elderly female who appears to be in no acute distress NAD, calm, comfortable Eyes: PERRL, lids and conjunctivae normal ENMT: Mucous membranes are dry.  There dentition.  Neck: normal, supple  Respiratory: Normal respiratory effort with crackles noted in the mid to lower lung fields.  Patient on 1.5 L of cannula oxygen with O2 saturations maintained. Cardiovascular: Irregular irregular and tachycardic.  No lower extremity edema. Abdomen: no tenderness, no masses palpated. No hepatosplenomegaly. Bowel sounds positive.  Musculoskeletal: no clubbing / cyanosis. No joint deformity upper and lower extremities. Good ROM, no contractures. Skin: no rashes, lesions, ulcers.  Neurologic: CN 2-12 grossly intact. Sensation intact, DTR normal. Strength 5/5 in all 4.  Psychiatric: Normal judgment and insight. Alert and oriented x 3. Normal mood.   Data Reviewed:  EKG significant for A-fib with RVR at 161 bpm.  Reviewed labs, imaging, and pertinent records as documented. Assessment and Plan:  Fall with rhabdomyolysis Acute.  Presents after having a fall.  CK noted to be elevated up to 1741.  Patient also noted to have mild elevation in liver function studies given concern for rhabdomyolysis.  Urinalysis still pending. -Admit to a progressive bed -Bedrest -Follow-up urinalysis -Continue IV fluids at 100 L/h -Check repeat CK in a.m. -Will need PT/OT once concern for rhabdomyolysis has resolved  Atrial fibrillation with RVR on chronic anticoagulation Patient presented in A-fib  with RVR with heart rates into the 160s.  She had been bolus Cardizem 15 mg IV, but has had hypotension with systolic blood pressures as low as 80s.  Thought secondary to  dehydration though.  Cardiology had been formally consulted and recommended considering digoxin or amiodarone if needed for rate control. CHA2DS2-VASc score is equal to at least  -Goal potassium is 4 and magnesium at least 2.  Replace electrolytes as needed to goal -Add on TSH(4.847) -Continue Xarelto -Patient unable to tolerate Cardizem due to soft blood pressures.  Reports compliance with Xarelto.  Orders placed to start amiodarone drip -Appreciate cardiology consultative services,  will follow-up for any further recommendations  SIRS Acute.  Patient noted to be tachycardic and tachypneic with white blood cell count initially elevated up to 19.4.  Chest x-ray was otherwise noted to be clear.  No initial lactic acid have been obtained and urinalysis was still pending. -Check lactic acid and blood culture -Follow-up urinalysis -Check procalcitonin  Elevated troponin Acute.  Patient denied having any complaints of chest pain.  Nausea high-sensitivity troponins initially elevated up to 136, but recheck trending down to 84.  Suspect secondary to demand in setting of rapid atrial fibrillation, rhabdomyolysis, and hypotension. -Follow-up echocardiogram  Acute respiratory failure with hypoxia On the emergency department patient was noted to have O2 saturations as low as 86% on room air for which patient was placed on 1.5 L of nasal cannula oxygen.  Patient was noted to have some crackles in the mid to lower lung fields bilaterally on physical exam. -Continue nasal cannula oxygen to maintain O2 saturations greater than 92% -Add on BNP  Transient hypotension Blood pressures initially noted to be as low as 84/54.  She reported that she was told to decrease fluid intake here recently for upcoming test.  Patient had been bolused 1.5 L  of IV fluids with improvement in blood pressures. -Goal MAP greater than 65 -Continue IV fluids as noted above adjust as needed -Hold home blood pressure medications of metoprolol at this time  Acute kidney injury On admission creatinine noted to be 1.3 with BUN 20.  Baseline creatinine previously noted to be 0.88 back in 07/2020.  Due to the elevated CK concern for rhabdomyolysis. -Monitor intake and output -Continue IV fluids  History of DVT/pulmonary embolism Chronic anticoagulation Patient with reported history of a left DVT and prior pulmonary embolism on chronic anticoagulation. -Continue Xarelto  Hyponatremia Acute.  Sodium noted to be 131.  Due to patient's reports of decreased p.o. intake thought prerenal in nature.  -Continue to monitor with rehydration  Abnormal TSH As noted above.  Suspect possibility of sick euthyroid. -Check free T4     DVT prophylaxis: Xarelto Advance Care Planning:   Code Status: Full Code    Consults: None  Family Communication: Husband to be updated  Severity of Illness: The appropriate patient status for this patient is OBSERVATION. Observation status is judged to be reasonable and necessary in order to provide the required intensity of service to ensure the patient's safety. The patient's presenting symptoms, physical exam findings, and initial radiographic and laboratory data in the context of their medical condition is felt to place them at decreased risk for further clinical deterioration. Furthermore, it is anticipated that the patient will be medically stable for discharge from the hospital within 2 midnights of admission.   Author: Clydie Braun, MD 11/19/2023 8:59 AM  For on call review [email or web address].

## 2023-11-19 NOTE — ED Provider Notes (Signed)
[facility]-EMERGENCY DEPT [facility] Emergency Department Provider Note MRN:  [id number]  Arrival date & time: 11/19/23     Chief Complaint   Fall   History of Present Illness   Stacy Valencia is a 89 y.o. year-old female with a history of PE presenting to the ED with chief complaint of fall.  Slipped and fell out of her chair, slumped to the ground.  Denies significant injuries.  More concerned about being nauseated for the past 2 days.  Episode of vomiting last night.  Denies fever, no diarrhea or constipation, no chest pain or abdominal pain.  Review of Systems  A thorough review of systems was obtained and all systems are negative except as noted in the HPI and PMH.   Patient's Health History    Past Medical History:  Diagnosis Date   GERD (gastroesophageal reflux disease)    PE (pulmonary embolism) 2005    Past Surgical History:  Procedure Laterality Date   ABDOMINAL HYSTERECTOMY     APPENDECTOMY     arthroscopic shoulder surgery Right 10/28/2016   decompression of SAD/DCR   INTRAMEDULLARY (IM) NAIL INTERTROCHANTERIC Left 07/23/2020   Procedure: INTRAMEDULLARY (IM) NAIL INTERTROCHANTRIC HIP;  Surgeon: Samson Frederic, MD;  Location: [facility] OR;  Service: Orthopedics;  Laterality: Left;    Family History  Problem Relation Age of Onset   Pulmonary embolism Neg Hx     Social History   Socioeconomic History   Marital status: Married    Spouse name: Not on file   Number of children: Not on file   Years of education: Not on file   Highest education level: Not on file  Occupational History   Not on file  Tobacco Use   Smoking status: Never   Smokeless tobacco: Never  Substance and Sexual Activity   Alcohol use: No    Comment: occasionally   Drug use: No   Sexual activity: Not on file  Other Topics Concern   Not on file  Social History Narrative   Not on file   Social Determinants of Health   Financial Resource Strain: Not on file  Food Insecurity: Not on file   Transportation Needs: Not on file  Physical Activity: Not on file  Stress: Not on file  Social Connections: Not on file  Intimate Partner Violence: Not on file     Physical Exam   Vitals:   11/19/23 0700 11/19/23 0715  BP: (!) 88/60   Pulse: (!) 120 96  Resp: (!) 21 (!) 21  Temp:    SpO2: 96% 95%    CONSTITUTIONAL: Well-appearing, NAD NEURO/PSYCH:  Alert and oriented x 3, no focal deficits EYES:  eyes equal and reactive ENT/NECK:  no LAD, no JVD CARDIO: Tachycardic rate, well-perfused, normal S1 and S2 PULM:  CTAB no wheezing or rhonchi GI/GU:  non-distended, non-tender MSK/SPINE:  No gross deformities, no edema SKIN:  no rash, atraumatic   *Additional and/or pertinent findings included in MDM below  Diagnostic and Interventional Summary    EKG Interpretation Date/Time:  Thursday November 19 2023 06:17:05 EST Ventricular Rate:  161 PR Interval:    QRS Duration:  83 QT Interval:  286 QTC Calculation: 468 R Axis:   -11  Text Interpretation: Atrial fibrillation with rapid V-rate Low voltage, precordial leads Abnormal R-wave progression, early transition Repolarization abnormality, prob rate related Confirmed by Kennis Carina [phone]) on 11/19/2023 6:50:46 AM       Labs Reviewed  CBC - Abnormal; Notable for the following components:  Result Value   WBC 19.4 (*)    MCV 106.3 (*)    MCH 36.3 (*)    RDW 15.6 (*)    All other components within normal limits  COMPREHENSIVE METABOLIC PANEL - Abnormal; Notable for the following components:   Sodium 131 (*)    CO2 18 (*)    Glucose, Bld 113 (*)    Creatinine, Ser 1.30 (*)    Total Protein 5.9 (*)    Albumin 2.8 (*)    AST 56 (*)    Total Bilirubin 1.8 (*)    GFR, Estimated 40 (*)    All other components within normal limits  CK - Abnormal; Notable for the following components:   Total CK 1,741 (*)    All other components within normal limits  TROPONIN I (HIGH SENSITIVITY) - Abnormal; Notable for the  following components:   Troponin I (High Sensitivity) 136 (*)    All other components within normal limits  LIPASE, BLOOD  URINALYSIS, ROUTINE W REFLEX MICROSCOPIC  TROPONIN I (HIGH SENSITIVITY)    CT HEAD WO CONTRAST ( )  Final Result    DG Chest Port 1 View  Final Result    CT ABDOMEN PELVIS W CONTRAST    (Results Pending)    Medications  sodium chloride 0.9 % bolus 500 mL (has no administration in time range)  ondansetron (ZOFRAN) injection 4 mg (4 mg Intravenous Given 11/19/23 0554)  sodium chloride 0.9 % bolus 1,000 mL (1,000 mLs Intravenous New Bag/Given 11/19/23 0556)  diltiazem (CARDIZEM) injection 15 mg (15 mg Intravenous Given 11/19/23 0627)  iohexol (OMNIPAQUE) 350 MG/ML injection 75 mL (75 mLs Intravenous Contrast Given 11/19/23 0723)     Procedures  /  Critical Care .Critical Care  Performed by: Sabas Sous, MD Authorized by: Sabas Sous, MD   Critical care provider statement:    Critical care time (minutes):  32   Critical care was necessary to treat or prevent imminent or life-threatening deterioration of the following conditions: A-fib with RVR.   Critical care was time spent personally by me on the following activities:  Development of treatment plan with patient or surrogate, discussions with consultants, evaluation of patient's response to treatment, examination of patient, ordering and review of laboratory studies, ordering and review of radiographic studies, ordering and performing treatments and interventions, pulse oximetry, re-evaluation of patient's condition and review of old charts   ED Course and Medical Decision Making  Initial Impression and Ddx Differential diagnosis includes intracranial bleeding, atypical presentation of ACS, gastroenteritis, SBO  Past medical/surgical history that increases complexity of ED encounter: PE  Interpretation of Diagnostics I personally reviewed the EKG and my interpretation is as follows: A-fib with  RVR  Labs reveal elevated troponin, leukocytosis  Patient Reassessment and Ultimate Disposition/Management     Case discussed with cardiology who agrees with hospitalist admission.  Providing patient with further IV fluids.  Awaiting CT imaging.  Signed out to oncoming provider at shift change.  Patient management required discussion with the following services or consulting groups:  Cardiology  Complexity of Problems Addressed Acute illness or injury that poses threat of life of bodily function  Additional Data Reviewed and Analyzed Further history obtained from: None  Additional Factors Impacting ED Encounter Risk Consideration of hospitalization  Elmer Sow. Pilar Plate, MD [facility] Emergency Medicine [facility] [email or web address]  Final Clinical Impressions(s) / ED Diagnoses     ICD-10-CM   1. Atrial fibrillation with rapid ventricular response (HCC)  I48.91     2. Nausea  R11.0       ED Discharge Orders     None        Discharge Instructions Discussed with and Provided to Patient:   Discharge Instructions   None      Sabas Sous, MD 11/19/23 [phone]

## 2023-11-19 NOTE — ED Notes (Signed)
Pt's HR ranging from 120-180 at this time.  Bero MD notified and given EKG.

## 2023-11-19 NOTE — Progress Notes (Signed)
Pharmacy Antibiotic Note  Stacy Valencia is a 89 y.o. female admitted on 11/19/2023 with sepsis.  Pharmacy has been consulted for vancomycin dosing.  Plan: Vancomycin 2000mg  IV x1 as loading dose followed by vancomycin 750mg  IV q 24h -levels PRN per protocol, goal trough 150-1mcg/mL, goal AUC 400-600 F/u renal function, culture data, clinical picture  Height: 5' 5.5" (166.4 cm) Weight: 102.1 kg (225 lb 1.4 oz) IBW/kg (Calculated) : 58.15  Temp (24hrs), Avg:98.1 F (36.7 C), Min:97.9 F (36.6 C), Max:98.1 F (36.7 C)  Recent Labs  Lab 11/19/23 0553 11/19/23 1508  WBC 19.4*  --   CREATININE 1.30*  --   LATICACIDVEN  --  2.6*    Estimated Creatinine Clearance: 35.8 mL/min (A) (by C-G formula based on SCr of 1.3 mg/dL (H)).    Allergies  Allergen Reactions   Sulfa Antibiotics Nausea And Vomiting    Antimicrobials this admission: Vanc 11/28> Ceftriaxone 11/28>  Dose adjustments this admission:   Microbiology results: 11/28 BCX:  11/28 UA: 11/28 MRSA PCR:    Thank you for allowing pharmacy to be a part of this patient's care.  Calton Dach, PharmD, BCCCP Clinical Pharmacist 11/19/2023 3:54 PM

## 2023-11-19 NOTE — Consult Note (Addendum)
Cardiology Consultation   Patient ID: OANH SINEGAL MRN: [id number]; DOB: [birth year]  Admit date: 11/19/2023 Date of Consult: 11/19/2023  PCP:  Laurell Josephs, MD (Inactive)   [facility] Providers Cardiologist:  Marjo Bicker, MD        Patient Profile:   Stacy Valencia is a 89 y.o. female with a hx of PE on xarelto with prior EKG consistent with Afib who is being seen 11/19/2023 for the evaluation of Afib RVR at the request of Dr. Charm Barges.  History of Present Illness:   Stacy Valencia has a history of PE/DVT on xarelto. She was last seen in the hospital in 2021 after a fall suffering hip fracture s/p nail fixation. She uses a walker at baseline.   Maintained on 12.5 mg lopressor BID and 20 mg xarelto.  She presented to [facility] this morning after sliding out of bed to the floor. Her husband was unable to get her out of the floor and she remained in the floor until about 0400. She has also been nauseated x 2 days. There was initially question if she missed home medications including xarelto.    On arrival she was noted to be tachycardic with EKG that showed Afib RVR with VR 161. BP 88/60, HR 120.  She was treated with cardizem 15 mg IV.   She remains tachycardic in the 90-120s with hypotension BP 88/60.   CT head and CT A/P unrevealing, no acute injuries or abdominal findings.   During my interview, she describes trying to transfer from the bed to the wheelchair last night and "missed." She does not recall an immediate injury. She denies cardiac symptoms prior to her event.  Her husband was unable to get her out of the floor. They discussed calling EMS but ultimately decided she would just sleep in the floor for the night and call in the morning. She states she was told a few days ago to decrease her PO fluid intake, unclear why. She generally is very well hydrated. She has been nauseated with vomiting for 1-2 days but denies missing medications. She suspects she had a "GI  bug."  She continues to be asymptomatic with Afib now with rates in the 100s. She is receiving an additional 500 NS bolus with improvement in her BP - was 107/68 on my recheck.   She is originally from [place]. She and her husband have 7 children, they continued to live independently at home.  Past Medical History:  Diagnosis Date   GERD (gastroesophageal reflux disease)    PE (pulmonary embolism) 2005    Past Surgical History:  Procedure Laterality Date   ABDOMINAL HYSTERECTOMY     APPENDECTOMY     arthroscopic shoulder surgery Right 10/28/2016   decompression of SAD/DCR   INTRAMEDULLARY (IM) NAIL INTERTROCHANTERIC Left 07/23/2020   Procedure: INTRAMEDULLARY (IM) NAIL INTERTROCHANTRIC HIP;  Surgeon: Samson Frederic, MD;  Location: [facility] OR;  Service: Orthopedics;  Laterality: Left;     Home Medications:  Prior to Admission medications   Medication Sig Start Date End Date Taking? Authorizing Provider  acetaminophen (TYLENOL) 325 MG tablet Take 1-2 tablets (325-650 mg total) by mouth every 4 (four) hours as needed for mild pain. 08/09/20  Yes Angiulli, Mcarthur Rossetti, PA-C  metoprolol tartrate (LOPRESSOR) 25 MG tablet Take 0.5 tablets (12.5 mg total) by mouth 2 (two) times daily. 08/09/20  Yes Angiulli, Mcarthur Rossetti, PA-C  XARELTO 20 MG TABS tablet TAKE 1 TABLET BY MOUTH EVERY DAY 04/15/22  Yes Benjiman Core, MD    Inpatient Medications: Scheduled Meds:  Continuous Infusions:  sodium chloride     PRN Meds:   Allergies:    Allergies  Allergen Reactions   Sulfa Antibiotics Nausea And Vomiting    Social History:   Social History   Socioeconomic History   Marital status: Married    Spouse name: Not on file   Number of children: Not on file   Years of education: Not on file   Highest education level: Not on file  Occupational History   Not on file  Tobacco Use   Smoking status: Never   Smokeless tobacco: Never  Substance and Sexual Activity   Alcohol use: No    Comment:  occasionally   Drug use: No   Sexual activity: Not on file  Other Topics Concern   Not on file  Social History Narrative   Not on file   Social Determinants of Health   Financial Resource Strain: Not on file  Food Insecurity: Not on file  Transportation Needs: Not on file  Physical Activity: Not on file  Stress: Not on file  Social Connections: Not on file  Intimate Partner Violence: Not on file    Family History:    Family History  Problem Relation Age of Onset   Pulmonary embolism Neg Hx      ROS:  Please see the history of present illness.   All other ROS reviewed and negative.     Physical Exam/Data:   Vitals:   11/19/23 0645 11/19/23 0659 11/19/23 0700 11/19/23 0715  BP: (!) 92/47  (!) 88/60   Pulse: 90 (!) 109 (!) 120 96  Resp: 16 (!) 21 (!) 21 (!) 21  Temp:      TempSrc:      SpO2: 92% 94% 96% 95%  Weight:      Height:       No intake or output data in the 24 hours ending 11/19/23 0833    11/19/2023    5:01 AM 08/11/2020   12:11 PM 07/25/2020    6:17 PM  Last 3 Weights  Weight (lbs) 225 lb 1.4 oz 225 lb 214 lb 8.1 oz  Weight (kg) 102.1 kg 102.059 kg 97.3 kg     Body mass index is 36.89 kg/m.  General:  elderly female in NAD HEENT: normal Neck: no JVD Vascular: No carotid bruits; Distal pulses 2+ bilaterally Cardiac:  irregular rhythm, tachycardic rate Lungs:  clear to auscultation bilaterally, no wheezing, rhonchi or rales  Abd: soft, nontender, no hepatomegaly  Ext: no edema Musculoskeletal:  No deformities, BUE and BLE strength normal and equal Skin: warm and dry  Neuro:  CNs 2-12 intact, no focal abnormalities noted Psych:  Normal affect   EKG:  The EKG was personally reviewed and demonstrates:  Afib with VR 161 Telemetry:  Telemetry was personally reviewed and demonstrates:  Atrial fibrillation with rates in the 90-120s  Relevant CV Studies:  none  Laboratory Data:  High Sensitivity Troponin:   Recent Labs  Lab 11/19/23 0553   TROPONINIHS 136*     Chemistry Recent Labs  Lab 11/19/23 0553  NA 131*  K 3.6  CL 99  CO2 18*  GLUCOSE 113*  BUN 20  CREATININE 1.30*  CALCIUM 9.1  GFRNONAA 40*  ANIONGAP 14    Recent Labs  Lab 11/19/23 0553  PROT 5.9*  ALBUMIN 2.8*  AST 56*  ALT 18  ALKPHOS 50  BILITOT 1.8*   Lipids  No results for input(s): "CHOL", "TRIG", "HDL", "LABVLDL", "LDLCALC", "CHOLHDL" in the last 168 hours.  Hematology Recent Labs  Lab 11/19/23 0553  WBC 19.4*  RBC 4.13  HGB 15.0  HCT 43.9  MCV 106.3*  MCH 36.3*  MCHC 34.2  RDW 15.6*  PLT 184   Thyroid No results for input(s): "TSH", "FREET4" in the last 168 hours.  BNPNo results for input(s): "BNP", "PROBNP" in the last 168 hours.  DDimer No results for input(s): "DDIMER" in the last 168 hours.   Radiology/Studies:  CT ABDOMEN PELVIS W CONTRAST  Result Date: 11/19/2023 CLINICAL DATA:  Patient slid from her bed to the floor last night. Patient now complaining lower back pain. Clinical concern for bowel obstruction. EXAM: CT ABDOMEN AND PELVIS WITH CONTRAST TECHNIQUE: Multidetector CT imaging of the abdomen and pelvis was performed using the standard protocol following bolus administration of intravenous contrast. RADIATION DOSE REDUCTION: This exam was performed according to the departmental dose-optimization program which includes automated exposure control, adjustment of the mA and/or kV according to patient size and/or use of iterative reconstruction technique. CONTRAST:  75mL OMNIPAQUE IOHEXOL 350 MG/ML SOLN COMPARISON:  Lumbar spine radiographs, 08/12/2020. Lumbar spine MRI, 10/08/2020. FINDINGS: Lower chest: Linear and reticular opacities noted at the lung bases, most prominently on the right, consistent with chronic scarring and associated with mild bronchiectasis. No acute findings. Hepatobiliary: Liver normal in size and overall attenuation. No mass or focal lesion. Gallbladder with mild increased attenuation along its  dependent wall suspected to be sludge. No stones. No wall thickening or adjacent inflammation. No bile duct dilation. Pancreas: Mostly fatty replaced.  No mass or inflammation. Spleen: Normal in size without focal abnormality. Adrenals/Urinary Tract: Normal adrenal glands. Kidneys normal in overall size, orientation and position with symmetric enhancement excretion. Mild bilateral renal cortical thinning. 1.7 cm cyst, midpole of the left kidney. 1.1 cm cyst, upper pole of the right kidney. These are consistent with simple cyst with no follow-up recommended. No stones. No hydronephrosis. Normal ureters. Normal bladder. Stomach/Bowel: Small hiatal hernia. Stomach otherwise unremarkable. Small bowel and colon are normal in caliber. No wall thickening. No inflammation. No evidence of obstruction. Left colon diverticula without diverticulitis. Vascular/Lymphatic: Aortic atherosclerosis. No aneurysm. No enlarged lymph nodes. Reproductive: Status post hysterectomy. No adnexal masses. Other: No abdominal wall hernia or abnormality. No abdominopelvic ascites. Musculoskeletal: Mild compression fracture of T12, severe compression fracture of L2 and moderate compression fracture of L3. Fractures are stable in appearance when compared to the MRI dated 10/08/2020. Previous ORIF of a left proximal femur fracture. No evidence of an acute fracture. No bone lesion. IMPRESSION: 1. No acute findings within the abdomen or pelvis. No evidence of bowel obstruction. 2. Chronic compression fractures of T12, L2 and L3. No evidence of an acute fracture. 3. Aortic atherosclerosis. Aortic Atherosclerosis (ICD10-I70.0). Electronically Signed   By: Amie Portland M.D.   On: 11/19/2023 07:45   CT HEAD WO CONTRAST ( )  Result Date: 11/19/2023 CLINICAL DATA:  Head trauma, minor.  Slide from bed last night. EXAM: CT HEAD WITHOUT CONTRAST TECHNIQUE: Contiguous axial images were obtained from the base of the skull through the vertex without  intravenous contrast. RADIATION DOSE REDUCTION: This exam was performed according to the departmental dose-optimization program which includes automated exposure control, adjustment of the mA and/or kV according to patient size and/or use of iterative reconstruction technique. COMPARISON:  None Available. FINDINGS: Brain: No evidence of acute infarction, hemorrhage, hydrocephalus, extra-axial collection or mass lesion/mass effect. Frontal predominant brain  atrophy chronic small vessel ischemia in the periventricular white matter. Vascular: No hyperdense vessel or unexpected calcification. Skull: Normal. Negative for fracture or focal lesion. Sinuses/Orbits: No acute finding. IMPRESSION: No evidence of intracranial injury or fracture. Electronically Signed   By: Tiburcio Pea M.D.   On: 11/19/2023 07:39   DG Chest Port 1 View  Result Date: 11/19/2023 CLINICAL DATA:  89 year old female with nausea, fall. EXAM: PORTABLE CHEST 1 VIEW COMPARISON:  Portable chest 07/22/2020 and earlier. FINDINGS: Portable AP semi upright view at 0527 hours. Improved lung volumes. Chronic tortuosity of the thoracic aorta appears stable. Stable cardiac size, within normal limits. Visualized tracheal air column is within normal limits. Allowing for portable technique the lungs are clear. No pneumothorax or pleural effusion. No acute osseous abnormality identified. Paucity of bowel gas the visible abdomen. IMPRESSION: No acute cardiopulmonary abnormality. Electronically Signed   By: Odessa Fleming M.D.   On: 11/19/2023 05:47     Assessment and Plan:   Atrial fibrillation with RVR PAF Hypotension - prior EKG appears consistent with rate controlled Afib (2021) - has been maintained on 12.5 mg lopressor BID - has received 15 mg IV cardizem - hypotensive in the 80-100s systolic - suspect she is dehydrated-  has received 1L NS bolus and getting an additional 500 cc NS - CXR without edema or signs of CHF - I suspect she is dehydrated  resulting in RVR along with rhabdomyolysis with CK 1741 and AKI - BP improving on my recheck 107/68 - if she remains hypotensive with HR above 100, can consider IV amiodarone - IV digoxin could be an option, but renal function may preclude this, would need to involve pharmacy - she is currently not in distress, would monitor BP and HR with additional fluid before adding IV medication - hold off on any further BB or CCB for now   Rhabdomyolysis AKI - CK 1741 - sCr 1.30 - hs troponin 136 --> 84 - troponin elevation likely due to RVR, hypotension, and rhabdomyolysis  - no chest pain, no ischemic evaluation planned   Chronic anticoagulation Hx of PE - has been maintained on 20 mg xarelto with no bleeding issues - PE in ?2019, unclear circumstances    Risk Assessment/Risk Scores:     CHA2DS2-VASc Score = 5   This indicates a 7.2% annual risk of stroke. The patient's score is based upon: CHF History: 0 HTN History: 0 Diabetes History: 0 Stroke History: 2 Vascular Disease History: 0 Age Score: 2 Gender Score: 1       For questions or updates, please contact [facility] Please consult [email or web address] for contact info under    Signed, Stacy Valencia, [place]  11/19/2023 8:33 AM

## 2023-11-19 NOTE — ED Notes (Addendum)
Patient leaving the floor in stable condition with staff and her belongings, CCMD and her husband notified at [phone] .

## 2023-11-19 NOTE — ED Notes (Signed)
Cardiology at bedside.

## 2023-11-19 NOTE — ED Notes (Signed)
ED TO INPATIENT HANDOFF REPORT  ED Nurse Name and Phone #: [phone]  S Name/Age/Gender Stacy Valencia 89 y.o. female Room/Bed: 003C/003C  Code Status   Code Status: Full Code  Home/SNF/Other Home Patient oriented to: self, place, time, and situation Is this baseline? Yes   Triage Complete: Triage complete  Chief Complaint Hip fracture (HCC) [S72.009A] Fall [W19.XXXA]  Triage Note Pt presents to ED 35 via EMS from home with report of sliding from bed to floor around 2100 last night.  Pt's husband unable to help her off floor so she told him to leave her on floor.  Pt on floor until around 0400 this morning.  Pt now having lower back pain.  Pt uses walker at baseline.  Pt takes Xarelto but has not taken in roughly 48 hours.   Allergies Allergies  Allergen Reactions   Sulfa Antibiotics Nausea And Vomiting    Level of Care/Admitting Diagnosis ED Disposition     ED Disposition  Admit   Condition  --   Comment  Hospital Area: [facility] [[id number]]  Level of Care: Progressive [102]  Admit to Progressive based on following criteria: CARDIOVASCULAR & THORACIC of moderate stability with acute coronary syndrome symptoms/low risk myocardial infarction/hypertensive urgency/arrhythmias/heart failure potentially compromising stability and stable post cardiovascular intervention patients.  May place patient in observation at [facility] or [facility] if equivalent level of care is available:: No  Covid Evaluation: Asymptomatic - no recent exposure (last 10 days) testing not required  Diagnosis: Fall [[id number]]  Admitting Physician: Clydie Braun [[id number]]  Attending Physician: Clydie Braun [[id number]]          B Medical/Surgery History Past Medical History:  Diagnosis Date   GERD (gastroesophageal reflux disease)    PE (pulmonary embolism) 2005   Past Surgical History:  Procedure Laterality Date   ABDOMINAL HYSTERECTOMY     APPENDECTOMY      arthroscopic shoulder surgery Right 10/28/2016   decompression of SAD/DCR   INTRAMEDULLARY (IM) NAIL INTERTROCHANTERIC Left 07/23/2020   Procedure: INTRAMEDULLARY (IM) NAIL INTERTROCHANTRIC HIP;  Surgeon: Samson Frederic, MD;  Location: [facility] OR;  Service: Orthopedics;  Laterality: Left;     A IV Location/Drains/Wounds Patient Lines/Drains/Airways Status     Active Line/Drains/Airways     Name Placement date Placement time Site Days   Peripheral IV 11/19/23 20 G Right Forearm 11/19/23  0552  Forearm  less than 1   Peripheral IV 11/19/23 20 G Left Forearm 11/19/23  0644  Forearm  less than 1   Incision (Closed) 07/23/20 Hip Left 07/23/20  1358  -- 1214   Incision (Closed) 08/04/20 Leg 08/04/20  2200  -- 1202            Intake/Output Last 24 hours  Intake/Output Summary (Last 24 hours) at 11/19/2023 1106 Last data filed at 11/19/2023 0700 Gross per 24 hour  Intake 1000 ml  Output --  Net 1000 ml    Labs/Imaging Results for orders placed or performed during the hospital encounter of 11/19/23 (from the past 48 hour(s))  CBC     Status: Abnormal   Collection Time: 11/19/23  5:53 AM  Result Value Ref Range   WBC 19.4 (H) 4.0 - 10.5 K/uL   RBC 4.13 3.87 - 5.11 MIL/uL   Hemoglobin 15.0 12.0 - 15.0 g/dL   HCT 86.5 78.4 - 69.6 %   MCV 106.3 (H) 80.0 - 100.0 fL   MCH 36.3 (H) 26.0 - 34.0 pg  MCHC 34.2 30.0 - 36.0 g/dL   RDW 08.6 (H) 57.8 - 46.9 %   Platelets 184 150 - 400 K/uL   nRBC 0.0 0.0 - 0.2 %    Comment: Performed at [facility], [address], [place], [place] [address]  Comprehensive metabolic panel     Status: Abnormal   Collection Time: 11/19/23  5:53 AM  Result Value Ref Range   Sodium 131 (L) 135 - 145 mmol/L   Potassium 3.6 3.5 - 5.1 mmol/L   Chloride 99 98 - 111 mmol/L   CO2 18 (L) 22 - 32 mmol/L   Glucose, Bld 113 (H) 70 - 99 mg/dL    Comment: Glucose reference range applies only to samples taken after fasting for at least 8 hours.   BUN 20 8 - 23  mg/dL   Creatinine, Ser 8.41 (H) 0.44 - 1.00 mg/dL   Calcium 9.1 8.9 - 32.4 mg/dL   Total Protein 5.9 (L) 6.5 - 8.1 g/dL   Albumin 2.8 (L) 3.5 - 5.0 g/dL   AST 56 (H) 15 - 41 U/L   ALT 18 0 - 44 U/L   Alkaline Phosphatase 50 38 - 126 U/L   Total Bilirubin 1.8 (H) <1.2 mg/dL   GFR, Estimated 40 (L) >60 mL/min    Comment: (NOTE) Calculated using the CKD-EPI Creatinine Equation (2021)    Anion gap 14 5 - 15    Comment: Performed at [facility], [address] [place], [place] [address]  Lipase, blood     Status: None   Collection Time: 11/19/23  5:53 AM  Result Value Ref Range   Lipase 19 11 - 51 U/L    Comment: Performed at [facility], [address], [place], [place] [address]  Troponin I (High Sensitivity)     Status: Abnormal   Collection Time: 11/19/23  5:53 AM  Result Value Ref Range   Troponin I (High Sensitivity) 136 (HH) <18 ng/L    Comment: CRITICAL RESULT CALLED TO, READ BACK BY AND VERIFIED WITH J.WALSTAN RN [id number] 11/19/2023 BY G.GANADEN (NOTE) Elevated high sensitivity troponin I (hsTnI) values and significant  changes across serial measurements may suggest ACS but many other  chronic and acute conditions are known to elevate hsTnI results.  Refer to the "Links" section for chest pain algorithms and additional  guidance. Performed at [facility], [address] [place], [place] [address]   CK     Status: Abnormal   Collection Time: 11/19/23  5:53 AM  Result Value Ref Range   Total CK 1,741 (H) 38 - 234 U/L    Comment: Performed at [facility], [address] [place], [place] [address]  Troponin I (High Sensitivity)     Status: Abnormal   Collection Time: 11/19/23  8:02 AM  Result Value Ref Range   Troponin I (High Sensitivity) 84 (H) <18 ng/L    Comment: (NOTE) Elevated high sensitivity troponin I (hsTnI) values and significant  changes across serial measurements may suggest ACS but many other  chronic and acute conditions are  known to elevate hsTnI results.  Refer to the "Links" section for chest pain algorithms and additional  guidance. Performed at [facility], [address], [place], [place] [address]   TSH     Status: Abnormal   Collection Time: 11/19/23  8:02 AM  Result Value Ref Range   TSH 4.847 (H) 0.350 - 4.500 uIU/mL  Comment: Performed by a 3rd Generation assay with a functional sensitivity of <=0.01 uIU/mL. Performed at [facility], [address], [place], [place] [address]   Magnesium     Status: Abnormal   Collection Time: 11/19/23  8:02 AM  Result Value Ref Range   Magnesium 0.8 (LL) 1.7 - 2.4 mg/dL    Comment: CRITICAL RESULT CALLED TO, READ BACK BY AND VERIFIED WITH M.Clever Geraldo,RN 1046 11/19/23 CLARK,S Performed at [facility], [address], [place], [place] [address]   Magnesium     Status: Abnormal   Collection Time: 11/19/23 10:00 AM  Result Value Ref Range   Magnesium 1.5 (L) 1.7 - 2.4 mg/dL    Comment: Performed at [facility], [address], [place], [place] [address]   CT ABDOMEN PELVIS W CONTRAST  Result Date: 11/19/2023 CLINICAL DATA:  Patient slid from her bed to the floor last night. Patient now complaining lower back pain. Clinical concern for bowel obstruction. EXAM: CT ABDOMEN AND PELVIS WITH CONTRAST TECHNIQUE: Multidetector CT imaging of the abdomen and pelvis was performed using the standard protocol following bolus administration of intravenous contrast. RADIATION DOSE REDUCTION: This exam was performed according to the departmental dose-optimization program which includes automated exposure control, adjustment of the mA and/or kV according to patient size and/or use of iterative reconstruction technique. CONTRAST:  75mL OMNIPAQUE IOHEXOL 350 MG/ML SOLN COMPARISON:  Lumbar spine radiographs, 08/12/2020. Lumbar spine MRI, 10/08/2020. FINDINGS: Lower chest: Linear and reticular opacities noted at the lung bases, most prominently on the right,  consistent with chronic scarring and associated with mild bronchiectasis. No acute findings. Hepatobiliary: Liver normal in size and overall attenuation. No mass or focal lesion. Gallbladder with mild increased attenuation along its dependent wall suspected to be sludge. No stones. No wall thickening or adjacent inflammation. No bile duct dilation. Pancreas: Mostly fatty replaced.  No mass or inflammation. Spleen: Normal in size without focal abnormality. Adrenals/Urinary Tract: Normal adrenal glands. Kidneys normal in overall size, orientation and position with symmetric enhancement excretion. Mild bilateral renal cortical thinning. 1.7 cm cyst, midpole of the left kidney. 1.1 cm cyst, upper pole of the right kidney. These are consistent with simple cyst with no follow-up recommended. No stones. No hydronephrosis. Normal ureters. Normal bladder. Stomach/Bowel: Small hiatal hernia. Stomach otherwise unremarkable. Small bowel and colon are normal in caliber. No wall thickening. No inflammation. No evidence of obstruction. Left colon diverticula without diverticulitis. Vascular/Lymphatic: Aortic atherosclerosis. No aneurysm. No enlarged lymph nodes. Reproductive: Status post hysterectomy. No adnexal masses. Other: No abdominal wall hernia or abnormality. No abdominopelvic ascites. Musculoskeletal: Mild compression fracture of T12, severe compression fracture of L2 and moderate compression fracture of L3. Fractures are stable in appearance when compared to the MRI dated 10/08/2020. Previous ORIF of a left proximal femur fracture. No evidence of an acute fracture. No bone lesion. IMPRESSION: 1. No acute findings within the abdomen or pelvis. No evidence of bowel obstruction. 2. Chronic compression fractures of T12, L2 and L3. No evidence of an acute fracture. 3. Aortic atherosclerosis. Aortic Atherosclerosis (ICD10-I70.0). Electronically Signed   By: Amie Portland M.D.   On: 11/19/2023 07:45   CT HEAD WO CONTRAST  ( )  Result Date: 11/19/2023 CLINICAL DATA:  Head trauma, minor.  Slide from bed last night. EXAM: CT HEAD WITHOUT CONTRAST TECHNIQUE: Contiguous axial images were obtained from the base of the skull through the vertex without intravenous contrast. RADIATION DOSE REDUCTION: This exam was performed according to the  departmental dose-optimization program which includes automated exposure control, adjustment of the mA and/or kV according to patient size and/or use of iterative reconstruction technique. COMPARISON:  None Available. FINDINGS: Brain: No evidence of acute infarction, hemorrhage, hydrocephalus, extra-axial collection or mass lesion/mass effect. Frontal predominant brain atrophy chronic small vessel ischemia in the periventricular white matter. Vascular: No hyperdense vessel or unexpected calcification. Skull: Normal. Negative for fracture or focal lesion. Sinuses/Orbits: No acute finding. IMPRESSION: No evidence of intracranial injury or fracture. Electronically Signed   By: Tiburcio Pea M.D.   On: 11/19/2023 07:39   DG Chest Port 1 View  Result Date: 11/19/2023 CLINICAL DATA:  89 year old female with nausea, fall. EXAM: PORTABLE CHEST 1 VIEW COMPARISON:  Portable chest 07/22/2020 and earlier. FINDINGS: Portable AP semi upright view at 0527 hours. Improved lung volumes. Chronic tortuosity of the thoracic aorta appears stable. Stable cardiac size, within normal limits. Visualized tracheal air column is within normal limits. Allowing for portable technique the lungs are clear. No pneumothorax or pleural effusion. No acute osseous abnormality identified. Paucity of bowel gas the visible abdomen. IMPRESSION: No acute cardiopulmonary abnormality. Electronically Signed   By: Odessa Fleming M.D.   On: 11/19/2023 05:47    Pending Labs Unresulted Labs (From admission, onward)     Start     Ordered   11/20/23 0500  CBC  Tomorrow morning,   R        11/19/23 1000   11/20/23 0500  Basic metabolic panel   Tomorrow morning,   R        11/19/23 1000   11/19/23 1000  Lactic acid, plasma  (Lactic Acid)  Add-on,   AD        11/19/23 1000   11/19/23 1000  Culture, blood (single) w Reflex to ID Panel  Once,   R        11/19/23 1000   11/19/23 0530  Urinalysis, Routine w reflex microscopic -Urine, Clean Catch  Once,   URGENT       Question:  Specimen Source  Answer:  Urine, Clean Catch   11/19/23 0530            Vitals/Pain Today's Vitals   11/19/23 0715 11/19/23 0830 11/19/23 0845 11/19/23 0855  BP:  (!) 100/56 (!) 87/54 107/69  Pulse: 96 (!) 102 (!) 119 (!) 112  Resp: (!) 21 13 13 18   Temp:  98.1 F (36.7 C)    TempSrc:      SpO2: 95% 98% 95% 99%  Weight:      Height:      PainSc:        Isolation Precautions No active isolations  Medications Medications  sodium chloride flush (NS) 0.9 % injection 3 mL (3 mLs Intravenous Given 11/19/23 1026)  acetaminophen (TYLENOL) tablet 650 mg (has no administration in time range)    Or  acetaminophen (TYLENOL) suppository 650 mg (has no administration in time range)  albuterol (PROVENTIL) (2.5 MG/3ML) 0.083% nebulizer solution 2.5 mg (has no administration in time range)  0.9 %  sodium chloride infusion ( Intravenous New Bag/Given 11/19/23 1024)  magnesium sulfate IVPB 4 g 100 mL (4 g Intravenous New Bag/Given 11/19/23 1102)  ondansetron (ZOFRAN) injection 4 mg (4 mg Intravenous Given 11/19/23 0554)  sodium chloride 0.9 % bolus 1,000 mL (0 mLs Intravenous Stopped 11/19/23 0700)  diltiazem (CARDIZEM) injection 15 mg (15 mg Intravenous Given 11/19/23 0627)  iohexol (OMNIPAQUE) 350 MG/ML injection 75 mL (75 mLs Intravenous Contrast Given 11/19/23 0723)  sodium chloride 0.9 % bolus 500 mL (0 mLs Intravenous Stopped 11/19/23 0930)    Mobility walks with device     Focused Assessments Cardiac Assessment Handoff:  Cardiac Rhythm: Atrial fibrillation Lab Results  Component Value Date   CKTOTAL 1,741 (H) 11/19/2023   TROPONINI <0.30  01/05/2013   No results found for: "DDIMER" Does the Patient currently have chest pain? No    R Recommendations: See Admitting Provider Note  Report given to:   Additional Notes: Pt able to verbalize her needs, fed herself, is AOX4, uses a walker at home, Mag 0.8, on 1.5L [place], is very lovely

## 2023-11-19 NOTE — Progress Notes (Signed)
Patient remained afebrile but was noted to meet SIRS criteria with initial white blood cell count elevated at 19.4, procalcitonin 5.29, and lactic acid 2.6.  Urinalysis still pending at this time.  Possible source of infection.  Started empiric antibiotics of vancomycin and Rocephin.  Continue to trend lactic acid levels.  Adjust antibiotics as needed

## 2023-11-20 ENCOUNTER — Inpatient Hospital Stay (HOSPITAL_COMMUNITY): Payer: Medicare Other

## 2023-11-20 DIAGNOSIS — Z882 Allergy status to sulfonamides status: Secondary | ICD-10-CM | POA: Diagnosis not present

## 2023-11-20 DIAGNOSIS — S72009D Fracture of unspecified part of neck of unspecified femur, subsequent encounter for closed fracture with routine healing: Secondary | ICD-10-CM | POA: Diagnosis not present

## 2023-11-20 DIAGNOSIS — Z9181 History of falling: Secondary | ICD-10-CM | POA: Diagnosis not present

## 2023-11-20 DIAGNOSIS — N179 Acute kidney failure, unspecified: Secondary | ICD-10-CM | POA: Diagnosis present

## 2023-11-20 DIAGNOSIS — R278 Other lack of coordination: Secondary | ICD-10-CM | POA: Diagnosis not present

## 2023-11-20 DIAGNOSIS — N390 Urinary tract infection, site not specified: Secondary | ICD-10-CM | POA: Diagnosis not present

## 2023-11-20 DIAGNOSIS — W19XXXS Unspecified fall, sequela: Secondary | ICD-10-CM

## 2023-11-20 DIAGNOSIS — Z79899 Other long term (current) drug therapy: Secondary | ICD-10-CM | POA: Diagnosis not present

## 2023-11-20 DIAGNOSIS — R7989 Other specified abnormal findings of blood chemistry: Secondary | ICD-10-CM

## 2023-11-20 DIAGNOSIS — Z7901 Long term (current) use of anticoagulants: Secondary | ICD-10-CM | POA: Diagnosis not present

## 2023-11-20 DIAGNOSIS — M6282 Rhabdomyolysis: Secondary | ICD-10-CM | POA: Diagnosis present

## 2023-11-20 DIAGNOSIS — D62 Acute posthemorrhagic anemia: Secondary | ICD-10-CM | POA: Diagnosis not present

## 2023-11-20 DIAGNOSIS — S72009A Fracture of unspecified part of neck of unspecified femur, initial encounter for closed fracture: Secondary | ICD-10-CM | POA: Diagnosis present

## 2023-11-20 DIAGNOSIS — M810 Age-related osteoporosis without current pathological fracture: Secondary | ICD-10-CM | POA: Diagnosis present

## 2023-11-20 DIAGNOSIS — E039 Hypothyroidism, unspecified: Secondary | ICD-10-CM | POA: Diagnosis not present

## 2023-11-20 DIAGNOSIS — I4891 Unspecified atrial fibrillation: Secondary | ICD-10-CM

## 2023-11-20 DIAGNOSIS — R6510 Systemic inflammatory response syndrome (SIRS) of non-infectious origin without acute organ dysfunction: Secondary | ICD-10-CM

## 2023-11-20 DIAGNOSIS — Z9981 Dependence on supplemental oxygen: Secondary | ICD-10-CM | POA: Diagnosis not present

## 2023-11-20 DIAGNOSIS — S7292XD Unspecified fracture of left femur, subsequent encounter for closed fracture with routine healing: Secondary | ICD-10-CM | POA: Diagnosis not present

## 2023-11-20 DIAGNOSIS — E861 Hypovolemia: Secondary | ICD-10-CM | POA: Diagnosis present

## 2023-11-20 DIAGNOSIS — Z86718 Personal history of other venous thrombosis and embolism: Secondary | ICD-10-CM | POA: Diagnosis not present

## 2023-11-20 DIAGNOSIS — I480 Paroxysmal atrial fibrillation: Secondary | ICD-10-CM | POA: Diagnosis present

## 2023-11-20 DIAGNOSIS — I959 Hypotension, unspecified: Secondary | ICD-10-CM | POA: Diagnosis present

## 2023-11-20 DIAGNOSIS — J9601 Acute respiratory failure with hypoxia: Secondary | ICD-10-CM | POA: Diagnosis not present

## 2023-11-20 DIAGNOSIS — E669 Obesity, unspecified: Secondary | ICD-10-CM | POA: Diagnosis present

## 2023-11-20 DIAGNOSIS — R0902 Hypoxemia: Secondary | ICD-10-CM | POA: Diagnosis present

## 2023-11-20 DIAGNOSIS — E46 Unspecified protein-calorie malnutrition: Secondary | ICD-10-CM | POA: Diagnosis not present

## 2023-11-20 DIAGNOSIS — G8918 Other acute postprocedural pain: Secondary | ICD-10-CM | POA: Diagnosis not present

## 2023-11-20 DIAGNOSIS — K219 Gastro-esophageal reflux disease without esophagitis: Secondary | ICD-10-CM | POA: Diagnosis present

## 2023-11-20 DIAGNOSIS — R54 Age-related physical debility: Secondary | ICD-10-CM | POA: Diagnosis present

## 2023-11-20 DIAGNOSIS — E0781 Sick-euthyroid syndrome: Secondary | ICD-10-CM | POA: Diagnosis present

## 2023-11-20 DIAGNOSIS — Z86711 Personal history of pulmonary embolism: Secondary | ICD-10-CM | POA: Diagnosis not present

## 2023-11-20 DIAGNOSIS — Z7401 Bed confinement status: Secondary | ICD-10-CM | POA: Diagnosis not present

## 2023-11-20 DIAGNOSIS — E860 Dehydration: Secondary | ICD-10-CM | POA: Diagnosis present

## 2023-11-20 DIAGNOSIS — R000 Tachycardia, unspecified: Secondary | ICD-10-CM | POA: Diagnosis not present

## 2023-11-20 DIAGNOSIS — E871 Hypo-osmolality and hyponatremia: Secondary | ICD-10-CM | POA: Diagnosis present

## 2023-11-20 DIAGNOSIS — F4322 Adjustment disorder with anxiety: Secondary | ICD-10-CM | POA: Diagnosis not present

## 2023-11-20 DIAGNOSIS — I10 Essential (primary) hypertension: Secondary | ICD-10-CM | POA: Diagnosis not present

## 2023-11-20 DIAGNOSIS — E8721 Acute metabolic acidosis: Secondary | ICD-10-CM | POA: Diagnosis present

## 2023-11-20 DIAGNOSIS — R531 Weakness: Secondary | ICD-10-CM | POA: Diagnosis not present

## 2023-11-20 DIAGNOSIS — Z87898 Personal history of other specified conditions: Secondary | ICD-10-CM | POA: Diagnosis not present

## 2023-11-20 DIAGNOSIS — Z6838 Body mass index (BMI) 38.0-38.9, adult: Secondary | ICD-10-CM | POA: Diagnosis not present

## 2023-11-20 DIAGNOSIS — D72829 Elevated white blood cell count, unspecified: Secondary | ICD-10-CM | POA: Diagnosis present

## 2023-11-20 DIAGNOSIS — I2489 Other forms of acute ischemic heart disease: Secondary | ICD-10-CM | POA: Diagnosis present

## 2023-11-20 DIAGNOSIS — W19XXXD Unspecified fall, subsequent encounter: Secondary | ICD-10-CM | POA: Diagnosis not present

## 2023-11-20 HISTORY — DX: Unspecified atrial fibrillation: I48.91

## 2023-11-20 LAB — ECHOCARDIOGRAM COMPLETE
AR max vel: 3.19 cm2
AV Area VTI: 3.17 cm2
AV Area mean vel: 2.67 cm2
AV Mean grad: 2 mm[Hg]
AV Peak grad: 2.7 mm[Hg]
Ao pk vel: 0.82 m/s
Area-P 1/2: 3.11 cm2
Calc EF: 69.5 %
Height: 65.5 in
MV VTI: 2.81 cm2
P 1/2 time: 249 ms
S' Lateral: 3.1 cm
Single Plane A2C EF: 69.6 %
Single Plane A4C EF: 69.3 %
Weight: 3601.43 [oz_av]

## 2023-11-20 LAB — BASIC METABOLIC PANEL
Anion gap: 7 (ref 5–15)
BUN: 12 mg/dL (ref 8–23)
CO2: 19 mmol/L — ABNORMAL LOW (ref 22–32)
Calcium: 7.6 mg/dL — ABNORMAL LOW (ref 8.9–10.3)
Chloride: 109 mmol/L (ref 98–111)
Creatinine, Ser: 0.76 mg/dL (ref 0.44–1.00)
GFR, Estimated: >60 mL/min (ref >60)
Glucose, Bld: 97 mg/dL (ref 70–99)
Potassium: 3.6 mmol/L (ref 3.5–5.1)
Sodium: 135 mmol/L (ref 135–145)

## 2023-11-20 LAB — CBC
HCT: 35.9 % — ABNORMAL LOW (ref 36.0–46.0)
Hemoglobin: 12.1 g/dL (ref 12.0–15.0)
MCH: 35.5 pg — ABNORMAL HIGH (ref 26.0–34.0)
MCHC: 33.7 g/dL (ref 30.0–36.0)
MCV: 105.3 fL — ABNORMAL HIGH (ref 80.0–100.0)
Platelets: 151 10*3/uL (ref 150–400)
RBC: 3.41 MIL/uL — ABNORMAL LOW (ref 3.87–5.11)
RDW: 15.8 % — ABNORMAL HIGH (ref 11.5–15.5)
WBC: 10.5 10*3/uL (ref 4.0–10.5)
nRBC: 0 % (ref 0.0–0.2)

## 2023-11-20 LAB — MAGNESIUM: Magnesium: 2.1 mg/dL (ref 1.7–2.4)

## 2023-11-20 MED ORDER — METOPROLOL TARTRATE 12.5 MG HALF TABLET
12.5000 mg | ORAL_TABLET | Freq: Two times a day (BID) | ORAL | Status: DC
  Administered 2023-11-20 – 2023-11-21 (×3): 12.5 mg via ORAL
  Filled 2023-11-20 (×4): qty 1

## 2023-11-20 MED ORDER — PERFLUTREN LIPID MICROSPHERE
1.0000 mL | INTRAVENOUS | Status: AC | PRN
  Administered 2023-11-20: 2 mL via INTRAVENOUS

## 2023-11-20 MED ORDER — PANTOPRAZOLE SODIUM 40 MG PO TBEC
40.0000 mg | DELAYED_RELEASE_TABLET | Freq: Every day | ORAL | Status: DC
  Administered 2023-11-20 – 2023-11-24 (×5): 40 mg via ORAL
  Filled 2023-11-20 (×4): qty 1

## 2023-11-20 MED ORDER — RIVAROXABAN 20 MG PO TABS
20.0000 mg | ORAL_TABLET | Freq: Every day | ORAL | Status: DC
  Administered 2023-11-20 – 2023-11-24 (×5): 20 mg via ORAL
  Filled 2023-11-20 (×6): qty 1

## 2023-11-20 NOTE — Evaluation (Signed)
Occupational Therapy Evaluation Patient Details Name: Stacy Valencia MRN: [id number] DOB: 01-Dec-1933 Today's Date: 11/20/2023   History of Present Illness Patient is a 89 y.o.  female with history of PAF, VTE, chronic anticoagulation, chronic debility-walks with the help of a walker-presented after mechanical fall-she was found to have A-fib RVR.   Clinical Impression   Pt feeling good at rest, no pain/discomfort, appears to have labored breathing at times, HR 110-115 at rest. Pt husband present during session. Pt lives at home with husband who assists with all bed mobility, transfers, ADLs, cooking/cleaning at baseline. Pt uses RW for mobility around home, has lift chair, w/c, transport chair. Pt currently presents with weakness and decreased activity tolerance, poor standing/sitting balance. Pt was able to take one step forward/back but immediately needed to sit down, reliant on RW and therapist for support. Pt would be able to stand pivot transfer mod A. Pt mod A for ADLs. Pt at this time would benefit from [facility] to maximize safety in home setting, Pt and husband state they have all DME at home needed, and husband can support with transfers and ADLs as needed 24/7. Pt and husband instructed on safety awareness and proper mechanics during transfer or sitting up from bed to ensure safety and prevent injuries. Will continue to see Pt acutely as able to progress as able.       If plan is discharge home, recommend the following: A lot of help with walking and/or transfers;A lot of help with bathing/dressing/bathroom;Assistance with cooking/housework;Assist for transportation;Help with stairs or ramp for entrance    Functional Status Assessment  Patient has had a recent decline in their functional status and demonstrates the ability to make significant improvements in function in a reasonable and predictable amount of time.  Equipment Recommendations  None recommended by OT    Recommendations for  Other Services       Precautions / Restrictions Precautions Precautions: Fall Restrictions Weight Bearing Restrictions: No      Mobility Bed Mobility Overal bed mobility: Needs Assistance Bed Mobility: Supine to Sit, Sit to Supine     Supine to sit: Mod assist, HOB elevated, Used rails Sit to supine: Mod assist, HOB elevated, Used rails   General bed mobility comments: assistance with BLEs back into bed, assist to power up out of bed.    Transfers Overall transfer level: Needs assistance Equipment used: Rolling walker (2 wheels) Transfers: Sit to/from Stand Sit to Stand: Mod assist           General transfer comment: mod A for STS using RW      Balance Overall balance assessment: Needs assistance Sitting-balance support: Feet supported, Bilateral upper extremity supported Sitting balance-Leahy Scale: Poor Sitting balance - Comments: EOB, R lateral lean, decreased activity tolerance, high HR up to 140 sitting   Standing balance support: During functional activity, Reliant on assistive device for balance, Bilateral upper extremity supported Standing balance-Leahy Scale: Poor Standing balance comment: reliant on RW, not able to stand more than 10 seconds                           ADL either performed or assessed with clinical judgement   ADL Overall ADL's : Needs assistance/impaired;At baseline Eating/Feeding: Independent   Grooming: Set up;Sitting   Upper Body Bathing: Moderate assistance;Sitting   Lower Body Bathing: Maximal assistance;Sitting/lateral leans   Upper Body Dressing : Moderate assistance;Sitting   Lower Body Dressing: Maximal assistance;Sitting/lateral leans  Toilet Transfer: Moderate assistance;BSC/3in1;Rolling walker (2 wheels)   Toileting- Clothing Manipulation and Hygiene: Moderate assistance;Sitting/lateral lean         General ADL Comments: Pt mod A for ADLs, poor sitting/standing balance and activity tolerance      Vision Baseline Vision/History: 0 No visual deficits Ability to See in Adequate Light: 0 Adequate Patient Visual Report: No change from baseline       Perception         Praxis         Pertinent Vitals/Pain Pain Assessment Pain Assessment: No/denies pain     Extremity/Trunk Assessment Upper Extremity Assessment Upper Extremity Assessment: RUE deficits/detail RUE Deficits / Details: Pt has 4th/5th MCP contracture, stiffness. RUE: Shoulder pain with ROM RUE Sensation: WNL RUE Coordination: decreased fine motor   Lower Extremity Assessment Lower Extremity Assessment: Generalized weakness   Cervical / Trunk Assessment Cervical / Trunk Assessment: Normal   Communication Communication Communication: No apparent difficulties Cueing Techniques: Verbal cues;Tactile cues   Cognition Arousal: Alert Behavior During Therapy: WFL for tasks assessed/performed Overall Cognitive Status: Within Functional Limits for tasks assessed                                       General Comments  HR up to 140 during session when sitting, 110 at rest before session.    Exercises     Shoulder Instructions      Home Living Family/patient expects to be discharged to:: Private residence Living Arrangements: Spouse/significant other Available Help at Discharge: Family;Available 24 hours/day Type of Home: House Home Access: Stairs to enter;Ramped entrance Entrance Stairs-Number of Steps: 2 Entrance Stairs-Rails: Can reach both Home Layout: One level     Bathroom Shower/Tub: Producer, television/film/video: Handicapped height Bathroom Accessibility: Yes   Home Equipment: Agricultural consultant (2 wheels);Wheelchair - manual;Shower seat;BSC/3in1;Grab bars - tub/shower;Hand held shower head;Lift chair   Additional Comments: Pt lives at home with husband, has lift chair for standing.      Prior Functioning/Environment Prior Level of Function : History of Falls (last six  months);Needs assist             Mobility Comments: uses RW, husband helps with standing, has lift chair for standing ADLs Comments: husband helps with all ADLs, bathing, transfers        OT Problem List: Decreased strength;Decreased range of motion;Decreased activity tolerance;Impaired balance (sitting and/or standing);Impaired UE functional use      OT Treatment/Interventions: Self-care/ADL training;Therapeutic exercise;Energy conservation;DME and/or AE instruction;Manual therapy;Therapeutic activities;Patient/family education    OT Goals(Current goals can be found in the care plan section) Acute Rehab OT Goals Patient Stated Goal: to return home OT Goal Formulation: With patient/family Time For Goal Achievement: 12/04/23 Potential to Achieve Goals: Good  OT Frequency: Min 1X/week    Co-evaluation              AM-PAC OT "6 Clicks" Daily Activity     Outcome Measure Help from another person eating meals?: None Help from another person taking care of personal grooming?: A Little Help from another person toileting, which includes using toliet, bedpan, or urinal?: A Lot Help from another person bathing (including washing, rinsing, drying)?: A Lot Help from another person to put on and taking off regular upper body clothing?: A Lot Help from another person to put on and taking off regular lower body clothing?: A Lot 6 Click Score:  15   End of Session Equipment Utilized During Treatment: Gait belt;Rolling walker (2 wheels) Nurse Communication: Mobility status  Activity Tolerance: Patient tolerated treatment well Patient left: in bed;with call bell/phone within reach;with bed alarm set;with nursing/sitter in room;with family/visitor present  OT Visit Diagnosis: Unsteadiness on feet (R26.81);Other abnormalities of gait and mobility (R26.89);Muscle weakness (generalized) (M62.81)                Time: 4782-9562 OT Time Calculation (min): 31 min Charges:  OT General  Charges $OT Visit: 1 Visit OT Evaluation $OT Eval Moderate Complexity: 1 Mod OT Treatments $Self Care/Home Management : 8-22 mins  [place], OTR/L   Alexis Goodell 11/20/2023, 3:23 PM

## 2023-11-20 NOTE — Progress Notes (Signed)
MEWS Progress Note  Patient Details Name: Stacy Valencia MRN: [id number] DOB: 06/07/1933 Today's Date: 11/20/2023   MEWS Flowsheet Documentation:  Assess: MEWS Score Temp: 98.3 F (36.8 C) BP: 131/74 MAP (mmHg): 88 Pulse Rate: 96 ECG Heart Rate: (!) 104 Resp: (!) 21 Level of Consciousness: Alert SpO2: 90 % O2 Device: Room Air O2 Flow Rate (L/min): 2 L/min Assess: MEWS Score MEWS Temp: 0 MEWS Systolic: 0 MEWS Pulse: 1 MEWS RR: 1 MEWS LOC: 0 MEWS Score: 2 MEWS Score Color: Yellow Assess: SIRS CRITERIA SIRS Temperature : 0 SIRS Respirations : 1 SIRS Pulse: 1 SIRS WBC: 0 SIRS Score Sum : 2 SIRS Temperature : 0 SIRS Pulse: 1 SIRS Respirations : 1 SIRS WBC: 0 SIRS Score Sum : 2 Assess: if the MEWS score is Yellow or Red Were vital signs accurate and taken at a resting state?: Yes Does the patient meet 2 or more of the SIRS criteria?: No MEWS guidelines implemented : Yes, yellow Treat MEWS Interventions: Considered administering scheduled or prn medications/treatments as ordered Take Vital Signs Increase Vital Sign Frequency : Yellow: Q2hr x1, continue Q4hrs until patient remains green for 12hrs Escalate MEWS: Escalate: Yellow: Discuss with charge nurse and consider notifying provider and/or RRT        [name] 11/20/2023, 1:10 PM

## 2023-11-20 NOTE — Progress Notes (Signed)
PROGRESS NOTE        PATIENT DETAILS Name: Stacy Valencia Age: 89 y.o. Sex: female Date of Birth: December 07, 1933 Admit Date: 11/19/2023 Admitting Physician Dewayne Shorter Levora Dredge, MD UEA:[id number], [name], MD (Inactive)  Brief Summary: Patient is a 89 y.o.  female with history of PAF, VTE, chronic anticoagulation, chronic debility-walks with the help of a walker-presented after mechanical fall-she was found to have A-fib RVR and subsequently admitted to the hospitalist service.  Significant events: 11/28>> mechanical fall-A-fib RVR-did not tolerate Cardizem due to soft BP-started on amiodarone infusion-cardiology consulted-admit to [facility]. 11/29>> back in sinus rhythm.  Significant studies: 11/28>> CT head: No acute intracranial abnormality 11/28>> CT abdomen/pelvis: No acute findings. 11/28>> CXR: No pneumonia  Significant microbiology data: 11/28>> blood culture: No growth  Procedures: None  Consults: Cardiology  Subjective: Lying comfortably in bed-denies any chest pain or shortness of breath.  Objective: Vitals: Blood pressure (!) 106/54, pulse 96, temperature 98.3 F (36.8 C), temperature source Oral, resp. rate 18, height 5' 5.5" (1.664 m), weight 102.1 kg, SpO2 95%.   Exam: Gen Exam:Alert awake-not in any distress HEENT:atraumatic, normocephalic Chest: B/L clear to auscultation anteriorly CVS:S1S2 regular Abdomen:soft non tender, non distended Extremities:no edema Neurology: Non focal Skin: no rash  Pertinent Labs/Radiology:    Latest Ref Rng & Units 11/20/2023    4:42 AM 11/19/2023    5:53 AM 08/07/2020    7:06 AM  CBC  WBC 4.0 - 10.5 K/uL 10.5  19.4  7.5   Hemoglobin 12.0 - 15.0 g/dL 19.1  47.8  29.5   Hematocrit 36.0 - 46.0 % 35.9  43.9  33.2   Platelets 150 - 400 K/uL 151  184  449     Lab Results  Component Value Date   NA 135 11/20/2023   K 3.6 11/20/2023   CL 109 11/20/2023   CO2 19 (L) 11/20/2023    Assessment/Plan: PAF  with RVR Sinus rhythm this morning Being transition from amiodarone to oral beta-blocker Continue Xarelto Await echo Cardiology following.  Mechanical fall Uses a walker at baseline-fell while transitioning from walker to wheelchair-stayed on the floor for several hours because she could not get up. No syncope Imaging negative for any major fractures. PT/OT eval  Rhabdomyolysis Secondary to fall and lying on the floor for several hours when she fell. Mild Hydrated overnight Stop IVF-recheck CK levels in morning.  AKI Mild Resolved Follow electrolytes periodically  Hyponatremia Resolved  SIRS Leukocytosis however no source of infection CXR negative for pneumonia (no cough/URI symptoms-although procalcitonin elevated) UA negative for UTI Blood cultures negative On exam-no focus of infection apparent Stop antibiotics and observe  History of DVT/pulmonary embolism Xarelto  Borderline hypotension Asymptomatic Supportive care for now Cortisol with a.m. labs  Probable sick euthyroid syndrome Repeat thyroid function test in 4 to 6 weeks  Chronic debility/deconditioning PT/OT eval  Obesity: Estimated body mass index is 36.89 kg/m as calculated from the following:   Height as of this encounter: 5' 5.5" (1.664 m).   Weight as of this encounter: 102.1 kg.   Code status:   Code Status: Full Code   DVT Prophylaxis: rivaroxaban (XARELTO) tablet 20 mg    Family Communication: None at bedside   Disposition Plan: Status is: Inpatient Remains inpatient appropriate because: Severity of illness   Planned Discharge Destination:Home health versus SNF   Diet: Diet  Order             Diet Heart Room service appropriate? Yes; Fluid consistency: Thin  Diet effective now                     Antimicrobial agents: Anti-infectives (From admission, onward)    Start     Dose/Rate Route Frequency Ordered Stop   11/20/23 1500  vancomycin (VANCOREADY) IVPB 750  mg/150 mL        750 mg 150 mL/hr over 60 Minutes Intravenous Every 24 hours 11/19/23 1556     11/19/23 1800  vancomycin (VANCOCIN) IVPB 1000 mg/200 mL premix       Placed in "Followed by" Linked Group   1,000 mg 200 mL/hr over 60 Minutes Intravenous  Once 11/19/23 1600 11/19/23 1837   11/19/23 1645  cefTRIAXone (ROCEPHIN) 2 g in sodium chloride 0.9 % 100 mL IVPB        2 g 200 mL/hr over 30 Minutes Intravenous Every 24 hours 11/19/23 1548     11/19/23 1645  vancomycin (VANCOREADY) IVPB 2000 mg/400 mL  Status:  Discontinued        2,000 mg 200 mL/hr over 120 Minutes Intravenous  Once 11/19/23 1555 11/19/23 1600   11/19/23 1645  vancomycin (VANCOCIN) IVPB 1000 mg/200 mL premix       Placed in "Followed by" Linked Group   1,000 mg 200 mL/hr over 60 Minutes Intravenous  Once 11/19/23 1600 11/19/23 1833        MEDICATIONS: Scheduled Meds:  metoprolol tartrate  12.5 mg Oral BID   rivaroxaban  20 mg Oral QAC supper   sodium chloride flush  3 mL Intravenous Q12H   Continuous Infusions:  cefTRIAXone (ROCEPHIN)  IV 2 g (11/19/23 1623)   vancomycin     PRN Meds:.acetaminophen **OR** acetaminophen, albuterol   I have personally reviewed following labs and imaging studies  LABORATORY DATA: CBC: Recent Labs  Lab 11/19/23 0553 11/20/23 0442  WBC 19.4* 10.5  HGB 15.0 12.1  HCT 43.9 35.9*  MCV 106.3* 105.3*  PLT 184 151    Basic Metabolic Panel: Recent Labs  Lab 11/19/23 0553 11/19/23 0802 11/19/23 1000 11/20/23 0442  NA 131*  --   --  135  K 3.6  --   --  3.6  CL 99  --   --  109  CO2 18*  --   --  19*  GLUCOSE 113*  --   --  97  BUN 20  --   --  12  CREATININE 1.30*  --   --  0.76  CALCIUM 9.1  --   --  7.6*  MG  --  0.8* 1.5* 2.1    GFR: Estimated Creatinine Clearance: 58.2 mL/min (by C-G formula based on SCr of 0.76 mg/dL).  Liver Function Tests: Recent Labs  Lab 11/19/23 0553  AST 56*  ALT 18  ALKPHOS 50  BILITOT 1.8*  PROT 5.9*  ALBUMIN 2.8*    Recent Labs  Lab 11/19/23 0553  LIPASE 19   No results for input(s): "AMMONIA" in the last 168 hours.  Coagulation Profile: No results for input(s): "INR", "PROTIME" in the last 168 hours.  Cardiac Enzymes: Recent Labs  Lab 11/19/23 0553  CKTOTAL 1,741*    BNP (last 3 results) No results for input(s): "PROBNP" in the last 8760 hours.  Lipid Profile: No results for input(s): "CHOL", "HDL", "LDLCALC", "TRIG", "CHOLHDL", "LDLDIRECT" in the last 72 hours.  Thyroid Function Tests:  Recent Labs    11/19/23 0802 11/19/23 1000  TSH 4.847*  --   FREET4  --  1.69*    Anemia Panel: No results for input(s): "VITAMINB12", "FOLATE", "FERRITIN", "TIBC", "IRON", "RETICCTPCT" in the last 72 hours.  Urine analysis:    Component Value Date/Time   COLORURINE YELLOW 11/19/2023 1850   APPEARANCEUR CLEAR 11/19/2023 1850   LABSPEC 1.028 11/19/2023 1850   PHURINE 5.0 11/19/2023 1850   GLUCOSEU NEGATIVE 11/19/2023 1850   HGBUR NEGATIVE 11/19/2023 1850   BILIRUBINUR NEGATIVE 11/19/2023 1850   KETONESUR NEGATIVE 11/19/2023 1850   PROTEINUR NEGATIVE 11/19/2023 1850   NITRITE NEGATIVE 11/19/2023 1850   LEUKOCYTESUR NEGATIVE 11/19/2023 1850    Sepsis Labs: Lactic Acid, Venous    Component Value Date/Time   LATICACIDVEN 1.9 11/19/2023 1937    MICROBIOLOGY: Recent Results (from the past 240 hour(s))  Culture, blood (single) w Reflex to ID Panel     Status: None (Preliminary result)   Collection Time: 11/19/23 10:00 AM   Specimen: BLOOD RIGHT ARM  Result Value Ref Range Status   Specimen Description BLOOD RIGHT ARM  Final   Special Requests   Final    BOTTLES DRAWN AEROBIC AND ANAEROBIC Blood Culture results may not be optimal due to an inadequate volume of blood received in culture bottles   Culture   Final    NO GROWTH < 24 HOURS Performed at [facility], [address] [place], [place] [address]    Report Status PENDING  Incomplete  MRSA Next Gen by PCR, Nasal      Status: None   Collection Time: 11/19/23  5:13 PM   Specimen: Nasal Mucosa; Nasal Swab  Result Value Ref Range Status   MRSA by PCR Next Gen NOT DETECTED NOT DETECTED Final    Comment: (NOTE) The GeneXpert MRSA Assay (FDA approved for NASAL specimens only), is one component of a comprehensive MRSA colonization surveillance program. It is not intended to diagnose MRSA infection nor to guide or monitor treatment for MRSA infections. Test performance is not FDA approved in patients less than 36 years old. Performed at [facility], [address], [place], [place] [address]     RADIOLOGY STUDIES/RESULTS: CT ABDOMEN PELVIS W CONTRAST  Result Date: 11/19/2023 CLINICAL DATA:  Patient slid from her bed to the floor last night. Patient now complaining lower back pain. Clinical concern for bowel obstruction. EXAM: CT ABDOMEN AND PELVIS WITH CONTRAST TECHNIQUE: Multidetector CT imaging of the abdomen and pelvis was performed using the standard protocol following bolus administration of intravenous contrast. RADIATION DOSE REDUCTION: This exam was performed according to the departmental dose-optimization program which includes automated exposure control, adjustment of the mA and/or kV according to patient size and/or use of iterative reconstruction technique. CONTRAST:  75mL OMNIPAQUE IOHEXOL 350 MG/ML SOLN COMPARISON:  Lumbar spine radiographs, 08/12/2020. Lumbar spine MRI, 10/08/2020. FINDINGS: Lower chest: Linear and reticular opacities noted at the lung bases, most prominently on the right, consistent with chronic scarring and associated with mild bronchiectasis. No acute findings. Hepatobiliary: Liver normal in size and overall attenuation. No mass or focal lesion. Gallbladder with mild increased attenuation along its dependent wall suspected to be sludge. No stones. No wall thickening or adjacent inflammation. No bile duct dilation. Pancreas: Mostly fatty replaced.  No mass or inflammation.  Spleen: Normal in size without focal abnormality. Adrenals/Urinary Tract: Normal adrenal glands. Kidneys normal in overall size, orientation and position with symmetric enhancement excretion. Mild bilateral renal cortical thinning.  1.7 cm cyst, midpole of the left kidney. 1.1 cm cyst, upper pole of the right kidney. These are consistent with simple cyst with no follow-up recommended. No stones. No hydronephrosis. Normal ureters. Normal bladder. Stomach/Bowel: Small hiatal hernia. Stomach otherwise unremarkable. Small bowel and colon are normal in caliber. No wall thickening. No inflammation. No evidence of obstruction. Left colon diverticula without diverticulitis. Vascular/Lymphatic: Aortic atherosclerosis. No aneurysm. No enlarged lymph nodes. Reproductive: Status post hysterectomy. No adnexal masses. Other: No abdominal wall hernia or abnormality. No abdominopelvic ascites. Musculoskeletal: Mild compression fracture of T12, severe compression fracture of L2 and moderate compression fracture of L3. Fractures are stable in appearance when compared to the MRI dated 10/08/2020. Previous ORIF of a left proximal femur fracture. No evidence of an acute fracture. No bone lesion. IMPRESSION: 1. No acute findings within the abdomen or pelvis. No evidence of bowel obstruction. 2. Chronic compression fractures of T12, L2 and L3. No evidence of an acute fracture. 3. Aortic atherosclerosis. Aortic Atherosclerosis (ICD10-I70.0). Electronically Signed   By: Amie Portland M.D.   On: 11/19/2023 07:45   CT HEAD WO CONTRAST ( )  Result Date: 11/19/2023 CLINICAL DATA:  Head trauma, minor.  Slide from bed last night. EXAM: CT HEAD WITHOUT CONTRAST TECHNIQUE: Contiguous axial images were obtained from the base of the skull through the vertex without intravenous contrast. RADIATION DOSE REDUCTION: This exam was performed according to the departmental dose-optimization program which includes automated exposure control, adjustment  of the mA and/or kV according to patient size and/or use of iterative reconstruction technique. COMPARISON:  None Available. FINDINGS: Brain: No evidence of acute infarction, hemorrhage, hydrocephalus, extra-axial collection or mass lesion/mass effect. Frontal predominant brain atrophy chronic small vessel ischemia in the periventricular white matter. Vascular: No hyperdense vessel or unexpected calcification. Skull: Normal. Negative for fracture or focal lesion. Sinuses/Orbits: No acute finding. IMPRESSION: No evidence of intracranial injury or fracture. Electronically Signed   By: Tiburcio Pea M.D.   On: 11/19/2023 07:39   DG Chest Port 1 View  Result Date: 11/19/2023 CLINICAL DATA:  89 year old female with nausea, fall. EXAM: PORTABLE CHEST 1 VIEW COMPARISON:  Portable chest 07/22/2020 and earlier. FINDINGS: Portable AP semi upright view at 0527 hours. Improved lung volumes. Chronic tortuosity of the thoracic aorta appears stable. Stable cardiac size, within normal limits. Visualized tracheal air column is within normal limits. Allowing for portable technique the lungs are clear. No pneumothorax or pleural effusion. No acute osseous abnormality identified. Paucity of bowel gas the visible abdomen. IMPRESSION: No acute cardiopulmonary abnormality. Electronically Signed   By: Odessa Fleming M.D.   On: 11/19/2023 05:47     LOS: 0 days   Jeoffrey Massed, MD  [facility]    To contact the attending provider between 7A-7P or the covering provider during after hours 7P-7A, please log into the web site [email or web address] and access using universal  password for that web site. If you do not have the password, please call the hospital operator.  11/20/2023, 10:26 AM

## 2023-11-20 NOTE — Plan of Care (Signed)
  Problem: Education: Goal: Knowledge of General Education information will improve Description: Including pain rating scale, medication(s)/side effects and non-pharmacologic comfort measures Outcome: Progressing   Problem: Health Behavior/Discharge Planning: Goal: Ability to manage health-related needs will improve Outcome: Progressing   Problem: Clinical Measurements: Goal: Will remain free from infection Outcome: Progressing Goal: Diagnostic test results will improve Outcome: Progressing Goal: Respiratory complications will improve Outcome: Progressing Goal: Cardiovascular complication will be avoided Outcome: Progressing   Problem: Activity: Goal: Risk for activity intolerance will decrease Outcome: Progressing   Problem: Coping: Goal: Level of anxiety will decrease Outcome: Progressing   Problem: Elimination: Goal: Will not experience complications related to bowel motility Outcome: Progressing   Problem: Safety: Goal: Ability to remain free from injury will improve Outcome: Progressing   Problem: Skin Integrity: Goal: Risk for impaired skin integrity will decrease Outcome: Progressing

## 2023-11-20 NOTE — Progress Notes (Signed)
Progress Note  Patient Name: Stacy Valencia Date of Encounter: 11/20/2023  [facility] Cardiologist: Marjo Bicker, MD   Patient Profile      Subjective   Sleepy this morning but no complaints.  Converted to sinus rhythm on telemetry  Inpatient Medications    Scheduled Meds:  sodium chloride flush  3 mL Intravenous Q12H   Continuous Infusions:  sodium chloride 100 mL/hr at 11/19/23 2040   amiodarone 30 mg/hr (11/20/23 0452)   cefTRIAXone (ROCEPHIN)  IV 2 g (11/19/23 1623)   vancomycin     PRN Meds: acetaminophen **OR** acetaminophen, albuterol   Vital Signs    Vitals:   11/19/23 2106 11/20/23 0008 11/20/23 0124 11/20/23 0450  BP:  (!) 104/55  139/77  Pulse:      Resp:  (!) 26 20 19   Temp: 98.1 F (36.7 C) 98.1 F (36.7 C)  97.8 F (36.6 C)  TempSrc: Oral Oral  Oral  SpO2: 92% 91% 95% 96%  Weight:      Height:        Intake/Output Summary (Last 24 hours) at 11/20/2023 0641 Last data filed at 11/19/2023 2108 Gross per 24 hour  Intake 1000 ml  Output 300 ml  Net 700 ml      11/19/2023    5:01 AM 08/11/2020   12:11 PM 07/25/2020    6:17 PM  Last 3 Weights  Weight (lbs) 225 lb 1.4 oz 225 lb 214 lb 8.1 oz  Weight (kg) 102.1 kg 102.059 kg 97.3 kg      Telemetry    Sinus rhythm- Personally Reviewed  ECG    No new EKG to review- Personally Reviewed  Physical Exam   GEN: No acute distress.   Neck: No JVD Cardiac: RRR, no murmurs, rubs, or gallops.  Respiratory: Clear to auscultation bilaterally. GI: Soft, nontender, non-distended  MS: No edema; No deformity. Neuro:  Nonfocal  Psych: Normal affect   Labs    High Sensitivity Troponin:   Recent Labs  Lab 11/19/23 0553 11/19/23 0802  TROPONINIHS 136* 84*      Chemistry Recent Labs  Lab 11/19/23 0553 11/20/23 0442  NA 131* 135  K 3.6 3.6  CL 99 109  CO2 18* 19*  GLUCOSE 113* 97  BUN 20 12  CREATININE 1.30* 0.76  CALCIUM 9.1 7.6*  PROT 5.9*  --   ALBUMIN 2.8*  --    AST 56*  --   ALT 18  --   ALKPHOS 50  --   BILITOT 1.8*  --   GFRNONAA 40* >60  ANIONGAP 14 7     Hematology Recent Labs  Lab 11/19/23 0553 11/20/23 0442  WBC 19.4* 10.5  RBC 4.13 3.41*  HGB 15.0 12.1  HCT 43.9 35.9*  MCV 106.3* 105.3*  MCH 36.3* 35.5*  MCHC 34.2 33.7  RDW 15.6* 15.8*  PLT 184 151    BNP Recent Labs  Lab 11/19/23 0553  BNP 437.2*     DDimer No results for input(s): "DDIMER" in the last 168 hours.   CHA2DS2-VASc Score = 5   This indicates a 7.2% annual risk of stroke. The patient's score is based upon: CHF History: 0 HTN History: 0 Diabetes History: 0 Stroke History: 2 Vascular Disease History: 0 Age Score: 2 Gender Score: 1      Radiology    CT ABDOMEN PELVIS W CONTRAST  Result Date: 11/19/2023 CLINICAL DATA:  Patient slid from her bed to the floor last night. Patient  now complaining lower back pain. Clinical concern for bowel obstruction. EXAM: CT ABDOMEN AND PELVIS WITH CONTRAST TECHNIQUE: Multidetector CT imaging of the abdomen and pelvis was performed using the standard protocol following bolus administration of intravenous contrast. RADIATION DOSE REDUCTION: This exam was performed according to the departmental dose-optimization program which includes automated exposure control, adjustment of the mA and/or kV according to patient size and/or use of iterative reconstruction technique. CONTRAST:  75mL OMNIPAQUE IOHEXOL 350 MG/ML SOLN COMPARISON:  Lumbar spine radiographs, 08/12/2020. Lumbar spine MRI, 10/08/2020. FINDINGS: Lower chest: Linear and reticular opacities noted at the lung bases, most prominently on the right, consistent with chronic scarring and associated with mild bronchiectasis. No acute findings. Hepatobiliary: Liver normal in size and overall attenuation. No mass or focal lesion. Gallbladder with mild increased attenuation along its dependent wall suspected to be sludge. No stones. No wall thickening or adjacent  inflammation. No bile duct dilation. Pancreas: Mostly fatty replaced.  No mass or inflammation. Spleen: Normal in size without focal abnormality. Adrenals/Urinary Tract: Normal adrenal glands. Kidneys normal in overall size, orientation and position with symmetric enhancement excretion. Mild bilateral renal cortical thinning. 1.7 cm cyst, midpole of the left kidney. 1.1 cm cyst, upper pole of the right kidney. These are consistent with simple cyst with no follow-up recommended. No stones. No hydronephrosis. Normal ureters. Normal bladder. Stomach/Bowel: Small hiatal hernia. Stomach otherwise unremarkable. Small bowel and colon are normal in caliber. No wall thickening. No inflammation. No evidence of obstruction. Left colon diverticula without diverticulitis. Vascular/Lymphatic: Aortic atherosclerosis. No aneurysm. No enlarged lymph nodes. Reproductive: Status post hysterectomy. No adnexal masses. Other: No abdominal wall hernia or abnormality. No abdominopelvic ascites. Musculoskeletal: Mild compression fracture of T12, severe compression fracture of L2 and moderate compression fracture of L3. Fractures are stable in appearance when compared to the MRI dated 10/08/2020. Previous ORIF of a left proximal femur fracture. No evidence of an acute fracture. No bone lesion. IMPRESSION: 1. No acute findings within the abdomen or pelvis. No evidence of bowel obstruction. 2. Chronic compression fractures of T12, L2 and L3. No evidence of an acute fracture. 3. Aortic atherosclerosis. Aortic Atherosclerosis (ICD10-I70.0). Electronically Signed   By: Amie Portland M.D.   On: 11/19/2023 07:45   CT HEAD WO CONTRAST ( )  Result Date: 11/19/2023 CLINICAL DATA:  Head trauma, minor.  Slide from bed last night. EXAM: CT HEAD WITHOUT CONTRAST TECHNIQUE: Contiguous axial images were obtained from the base of the skull through the vertex without intravenous contrast. RADIATION DOSE REDUCTION: This exam was performed according to  the departmental dose-optimization program which includes automated exposure control, adjustment of the mA and/or kV according to patient size and/or use of iterative reconstruction technique. COMPARISON:  None Available. FINDINGS: Brain: No evidence of acute infarction, hemorrhage, hydrocephalus, extra-axial collection or mass lesion/mass effect. Frontal predominant brain atrophy chronic small vessel ischemia in the periventricular white matter. Vascular: No hyperdense vessel or unexpected calcification. Skull: Normal. Negative for fracture or focal lesion. Sinuses/Orbits: No acute finding. IMPRESSION: No evidence of intracranial injury or fracture. Electronically Signed   By: Tiburcio Pea M.D.   On: 11/19/2023 07:39   DG Chest Port 1 View  Result Date: 11/19/2023 CLINICAL DATA:  89 year old female with nausea, fall. EXAM: PORTABLE CHEST 1 VIEW COMPARISON:  Portable chest 07/22/2020 and earlier. FINDINGS: Portable AP semi upright view at 0527 hours. Improved lung volumes. Chronic tortuosity of the thoracic aorta appears stable. Stable cardiac size, within normal limits. Visualized tracheal air column is within normal  limits. Allowing for portable technique the lungs are clear. No pneumothorax or pleural effusion. No acute osseous abnormality identified. Paucity of bowel gas the visible abdomen. IMPRESSION: No acute cardiopulmonary abnormality. Electronically Signed   By: Odessa Fleming M.D.   On: 11/19/2023 05:47    Patient Profile     89 y.o. female  with a hx of PE on xarelto with prior EKG consistent with Afib who is being seen 11/19/2023 for the evaluation of Afib RVR at the request of Dr. Charm Barges.   Assessment & Plan    Expand All Collapse All      Cardiology Consultation    Patient ID: Stacy Valencia MRN: [id number]; DOB: February 15, 1933   Admit date: 11/19/2023 Date of Consult: 11/19/2023   PCP:  Laurell Josephs, MD (Inactive)              [facility] Providers Cardiologist:  Marjo Bicker, MD          Patient Profile:    Stacy Valencia is a 89 y.o. female with a hx of PE on xarelto with prior EKG consistent with Afib who is being seen 11/19/2023 for the evaluation of Afib RVR at the request of Dr. Charm Barges.   History of Present Illness:    Ms. Mussen has a history of PE/DVT on xarelto. She was last seen in the hospital in 2021 after a fall suffering hip fracture s/p nail fixation. She uses a walker at baseline.    Maintained on 12.5 mg lopressor BID and 20 mg xarelto.   She presented to [facility] this morning after sliding out of bed to the floor. Her husband was unable to get her out of the floor and she remained in the floor until about 0400. She has also been nauseated x 2 days. There was initially question if she missed home medications including xarelto.     On arrival she was noted to be tachycardic with EKG that showed Afib RVR with VR 161. BP 88/60, HR 120.  She was treated with cardizem 15 mg IV.    She remains tachycardic in the 90-120s with hypotension BP 88/60.    CT head and CT A/P unrevealing, no acute injuries or abdominal findings.    During my interview, she describes trying to transfer from the bed to the wheelchair last night and "missed." She does not recall an immediate injury. She denies cardiac symptoms prior to her event.  Her husband was unable to get her out of the floor. They discussed calling EMS but ultimately decided she would just sleep in the floor for the night and call in the morning. She states she was told a few days ago to decrease her PO fluid intake, unclear why. She generally is very well hydrated. She has been nauseated with vomiting for 1-2 days but denies missing medications. She suspects she had a "GI bug."  She continues to be asymptomatic with Afib now with rates in the 100s. She is receiving an additional 500 NS bolus with improvement in her BP - was 107/68 on my recheck.    She is originally from [place]. She and her husband  have 7 children, they continued to live independently at home.       Past Medical History:  Diagnosis Date   GERD (gastroesophageal reflux disease)     PE (pulmonary embolism) 2005               Past Surgical History:  Procedure Laterality  Date   ABDOMINAL HYSTERECTOMY       APPENDECTOMY       arthroscopic shoulder surgery Right 10/28/2016    decompression of SAD/DCR   INTRAMEDULLARY (IM) NAIL INTERTROCHANTERIC Left 07/23/2020    Procedure: INTRAMEDULLARY (IM) NAIL INTERTROCHANTRIC HIP;  Surgeon: Samson Frederic, MD;  Location: [facility] OR;  Service: Orthopedics;  Laterality: Left;          Home Medications:         Prior to Admission medications   Medication Sig Start Date End Date Taking? Authorizing Provider  acetaminophen (TYLENOL) 325 MG tablet Take 1-2 tablets (325-650 mg total) by mouth every 4 (four) hours as needed for mild pain. 08/09/20   Yes Angiulli, Mcarthur Rossetti, PA-C  metoprolol tartrate (LOPRESSOR) 25 MG tablet Take 0.5 tablets (12.5 mg total) by mouth 2 (two) times daily. 08/09/20   Yes Angiulli, Mcarthur Rossetti, PA-C  XARELTO 20 MG TABS tablet TAKE 1 TABLET BY MOUTH EVERY DAY 04/15/22   Yes Benjiman Core, MD      Inpatient Medications: Scheduled Meds:       Continuous Infusions:  sodium chloride          PRN Meds:         Allergies:    Allergies      Allergies  Allergen Reactions   Sulfa Antibiotics Nausea And Vomiting        Social History:   Social History         Socioeconomic History   Marital status: Married      Spouse name: Not on file   Number of children: Not on file   Years of education: Not on file   Highest education level: Not on file  Occupational History   Not on file  Tobacco Use   Smoking status: Never   Smokeless tobacco: Never  Substance and Sexual Activity   Alcohol use: No      Comment: occasionally   Drug use: No   Sexual activity: Not on file  Other Topics Concern   Not on file  Social History Narrative   Not on file     Social Determinants of Health    Financial Resource Strain: Not on file  Food Insecurity: Not on file  Transportation Needs: Not on file  Physical Activity: Not on file  Stress: Not on file  Social Connections: Not on file  Intimate Partner Violence: Not on file    Family History:          Family History  Problem Relation Age of Onset   Pulmonary embolism Neg Hx            ROS:  Please see the history of present illness.    All other ROS reviewed and negative.      Physical Exam/Data:          Vitals:    11/19/23 0645 11/19/23 0659 11/19/23 0700 11/19/23 0715  BP: (!) 92/47   (!) 88/60    Pulse: 90 (!) 109 (!) 120 96  Resp: 16 (!) 21 (!) 21 (!) 21  Temp:          TempSrc:          SpO2: 92% 94% 96% 95%  Weight:          Height:            No intake or output data in the 24 hours ending 11/19/23 0833     11/19/2023    5:01 AM  08/11/2020   12:11 PM 07/25/2020    6:17 PM  Last 3 Weights  Weight (lbs) 225 lb 1.4 oz 225 lb 214 lb 8.1 oz  Weight (kg) 102.1 kg 102.059 kg 97.3 kg     Body mass index is 36.89 kg/m.  General:  elderly female in NAD HEENT: normal Neck: no JVD Vascular: No carotid bruits; Distal pulses 2+ bilaterally Cardiac:  irregular rhythm, tachycardic rate Lungs:  clear to auscultation bilaterally, no wheezing, rhonchi or rales  Abd: soft, nontender, no hepatomegaly  Ext: no edema Musculoskeletal:  No deformities, BUE and BLE strength normal and equal Skin: warm and dry  Neuro:  CNs 2-12 intact, no focal abnormalities noted Psych:  Normal affect    EKG:  The EKG was personally reviewed and demonstrates:  Afib with VR 161 Telemetry:  Telemetry was personally reviewed and demonstrates:  Atrial fibrillation with rates in the 90-120s   Relevant CV Studies:   none   Laboratory Data:   High Sensitivity Troponin:   Last Labs     Recent Labs  Lab 11/19/23 0553  TROPONINIHS 136*       Chemistry Last Labs     Recent Labs  Lab  11/19/23 0553  NA 131*  K 3.6  CL 99  CO2 18*  GLUCOSE 113*  BUN 20  CREATININE 1.30*  CALCIUM 9.1  GFRNONAA 40*  ANIONGAP 14      Last Labs     Recent Labs  Lab 11/19/23 0553  PROT 5.9*  ALBUMIN 2.8*  AST 56*  ALT 18  ALKPHOS 50  BILITOT 1.8*      Lipids  Last Labs  No results for input(s): "CHOL", "TRIG", "HDL", "LABVLDL", "LDLCALC", "CHOLHDL" in the last 168 hours.    Hematology Last Labs     Recent Labs  Lab 11/19/23 0553  WBC 19.4*  RBC 4.13  HGB 15.0  HCT 43.9  MCV 106.3*  MCH 36.3*  MCHC 34.2  RDW 15.6*  PLT 184      Thyroid  Last Labs  No results for input(s): "TSH", "FREET4" in the last 168 hours.    BNP Last Labs  No results for input(s): "BNP", "PROBNP" in the last 168 hours.    DDimer  Last Labs  No results for input(s): "DDIMER" in the last 168 hours.       Radiology/Studies:  CT ABDOMEN PELVIS W CONTRAST   Result Date: 11/19/2023 CLINICAL DATA:  Patient slid from her bed to the floor last night. Patient now complaining lower back pain. Clinical concern for bowel obstruction. EXAM: CT ABDOMEN AND PELVIS WITH CONTRAST TECHNIQUE: Multidetector CT imaging of the abdomen and pelvis was performed using the standard protocol following bolus administration of intravenous contrast. RADIATION DOSE REDUCTION: This exam was performed according to the departmental dose-optimization program which includes automated exposure control, adjustment of the mA and/or kV according to patient size and/or use of iterative reconstruction technique. CONTRAST:  75mL OMNIPAQUE IOHEXOL 350 MG/ML SOLN COMPARISON:  Lumbar spine radiographs, 08/12/2020. Lumbar spine MRI, 10/08/2020. FINDINGS: Lower chest: Linear and reticular opacities noted at the lung bases, most prominently on the right, consistent with chronic scarring and associated with mild bronchiectasis. No acute findings. Hepatobiliary: Liver normal in size and overall attenuation. No mass or focal lesion.  Gallbladder with mild increased attenuation along its dependent wall suspected to be sludge. No stones. No wall thickening or adjacent inflammation. No bile duct dilation. Pancreas: Mostly fatty replaced.  No mass or inflammation. Spleen:  Normal in size without focal abnormality. Adrenals/Urinary Tract: Normal adrenal glands. Kidneys normal in overall size, orientation and position with symmetric enhancement excretion. Mild bilateral renal cortical thinning. 1.7 cm cyst, midpole of the left kidney. 1.1 cm cyst, upper pole of the right kidney. These are consistent with simple cyst with no follow-up recommended. No stones. No hydronephrosis. Normal ureters. Normal bladder. Stomach/Bowel: Small hiatal hernia. Stomach otherwise unremarkable. Small bowel and colon are normal in caliber. No wall thickening. No inflammation. No evidence of obstruction. Left colon diverticula without diverticulitis. Vascular/Lymphatic: Aortic atherosclerosis. No aneurysm. No enlarged lymph nodes. Reproductive: Status post hysterectomy. No adnexal masses. Other: No abdominal wall hernia or abnormality. No abdominopelvic ascites. Musculoskeletal: Mild compression fracture of T12, severe compression fracture of L2 and moderate compression fracture of L3. Fractures are stable in appearance when compared to the MRI dated 10/08/2020. Previous ORIF of a left proximal femur fracture. No evidence of an acute fracture. No bone lesion. IMPRESSION: 1. No acute findings within the abdomen or pelvis. No evidence of bowel obstruction. 2. Chronic compression fractures of T12, L2 and L3. No evidence of an acute fracture. 3. Aortic atherosclerosis. Aortic Atherosclerosis (ICD10-I70.0). Electronically Signed   By: Amie Portland M.D.   On: 11/19/2023 07:45    CT HEAD WO CONTRAST ( )   Result Date: 11/19/2023 CLINICAL DATA:  Head trauma, minor.  Slide from bed last night. EXAM: CT HEAD WITHOUT CONTRAST TECHNIQUE: Contiguous axial images were obtained  from the base of the skull through the vertex without intravenous contrast. RADIATION DOSE REDUCTION: This exam was performed according to the departmental dose-optimization program which includes automated exposure control, adjustment of the mA and/or kV according to patient size and/or use of iterative reconstruction technique. COMPARISON:  None Available. FINDINGS: Brain: No evidence of acute infarction, hemorrhage, hydrocephalus, extra-axial collection or mass lesion/mass effect. Frontal predominant brain atrophy chronic small vessel ischemia in the periventricular white matter. Vascular: No hyperdense vessel or unexpected calcification. Skull: Normal. Negative for fracture or focal lesion. Sinuses/Orbits: No acute finding. IMPRESSION: No evidence of intracranial injury or fracture. Electronically Signed   By: Tiburcio Pea M.D.   On: 11/19/2023 07:39    DG Chest Port 1 View   Result Date: 11/19/2023 CLINICAL DATA:  89 year old female with nausea, fall. EXAM: PORTABLE CHEST 1 VIEW COMPARISON:  Portable chest 07/22/2020 and earlier. FINDINGS: Portable AP semi upright view at 0527 hours. Improved lung volumes. Chronic tortuosity of the thoracic aorta appears stable. Stable cardiac size, within normal limits. Visualized tracheal air column is within normal limits. Allowing for portable technique the lungs are clear. No pneumothorax or pleural effusion. No acute osseous abnormality identified. Paucity of bowel gas the visible abdomen. IMPRESSION: No acute cardiopulmonary abnormality. Electronically Signed   By: Odessa Fleming M.D.   On: 11/19/2023 05:47       Assessment and Plan:    Atrial fibrillation with RVR PAF Hypotension - prior EKG appears consistent with rate controlled Afib (2021) - has been maintained on 12.5 mg lopressor BID - hypotensive in the 80-100s systolic on arrival related to dehydration has improved with IV fluids - CXR without edema or signs of CHF - suspect she is dehydrated  resulting in RVR along with rhabdomyolysis with CK 1741 and AKI - BP much improved at 142/72 mmHg or IV fluid resuscitation - She was started on IV Amio last night for rate control which we will stop this morning since she is in sinus rhythm - Start back on home dose  of Lopressor 12.5 mg twice daily as BP tolerates   Rhabdomyolysis AKI Elevated troponin - CK 1741 - sCr 1.30>> improved to 0.76 this morning with fluids - CPK 1741 with hs troponin 136 --> 84 consistent with rhabdomyolysis.  She has not had any chest pain. - Check 2D echo today and if EF normal no further ischemic workup - troponin elevation likely due to demand ischemia in the setting of A-fib with RVR, hypotension, and rhabdomyolysis      Chronic anticoagulation Hx of PE - has been maintained on 20 mg xarelto with no bleeding issues - PE in ?2019, unclear circumstances          For questions or updates, please contact  [facility] Please consult [email or web address] for contact info under        Signed, Armanda Magic, MD  11/20/2023, 6:41 AM

## 2023-11-20 NOTE — Progress Notes (Signed)
   11/20/23 1631  TOC Brief Assessment  Insurance and Status Reviewed (Medicare A and B)  Patient has primary care physician Yes Kateri Plummer, [name], MD)  Home environment has been reviewed from home with Husband  Prior level of function: uses walker and wheelchair  (fell trying to transfer from one to the other )  Prior/Current Home Services No current home services  Social Determinants of Health Reivew SDOH reviewed no interventions necessary  Readmission risk has been reviewed Yes (10%)  Transition of care needs transition of care needs identified, TOC will continue to follow   Patient will need HH PT and OT  [facility] will provide services No DME needed. Please consult TOC if additional needs arise

## 2023-11-20 NOTE — Evaluation (Signed)
Physical Therapy Evaluation Patient Details Name: Stacy Valencia MRN: [id number] DOB: September 15, 1933 Today's Date: 11/20/2023  History of Present Illness  Patient is a 89 y.o.  female with history of PAF, VTE, chronic anticoagulation, chronic debility-walks with the help of a walker-presented after mechanical fall-she was found to have A-fib RVR.  Clinical Impression  Pt presents with admitting diagnosis above. Session today limited by high HR. Pt required Mod A for bed mobility however HR up to 151 just sitting EOB. RN made aware. Pt reports PTA she was ambulating household distances with RW and her husband helped out with most ADLs. DC recs TBD pending OOB assessment however pt reports she will refuse SNF so likely HHPT. PT will continue to follow.        If plan is discharge home, recommend the following: A lot of help with walking and/or transfers;A little help with bathing/dressing/bathroom;Assistance with cooking/housework;Direct supervision/assist for medications management;Assist for transportation;Help with stairs or ramp for entrance   Can travel by private vehicle        Equipment Recommendations Other (comment) (TBD)  Recommendations for Other Services       Functional Status Assessment Patient has had a recent decline in their functional status and demonstrates the ability to make significant improvements in function in a reasonable and predictable amount of time.     Precautions / Restrictions Precautions Precautions: Fall Restrictions Weight Bearing Restrictions: No      Mobility  Bed Mobility Overal bed mobility: Needs Assistance Bed Mobility: Supine to Sit, Sit to Supine     Supine to sit: Mod assist, HOB elevated, Used rails Sit to supine: Mod assist, HOB elevated, Used rails   General bed mobility comments: Assistance for trunk elevation and BLE management. Further mobility deferred due to HR increasing to 151 once seated EOB.    Transfers                    General transfer comment: Deferred due to HR    Ambulation/Gait                  Stairs            Wheelchair Mobility     Tilt Bed    Modified Rankin (Stroke Patients Only)       Balance Overall balance assessment: Needs assistance Sitting-balance support: Feet supported, Bilateral upper extremity supported Sitting balance-Leahy Scale: Fair Sitting balance - Comments: EOB                                     Pertinent Vitals/Pain Pain Assessment Pain Assessment: No/denies pain    Home Living Family/patient expects to be discharged to:: Private residence Living Arrangements: Spouse/significant other Available Help at Discharge: Family;Available 24 hours/day Type of Home: House Home Access: Stairs to enter;Ramped entrance Entrance Stairs-Rails: Can reach both Entrance Stairs-Number of Steps: 2   Home Layout: One level Home Equipment: Agricultural consultant (2 wheels);Wheelchair - manual;Shower seat;BSC/3in1;Grab bars - tub/shower;Hand held shower head      Prior Function Prior Level of Function : Independent/Modified Independent;History of Falls (last six months)             Mobility Comments: Mod I RW ADLs Comments: Mostly independent however states that husband will help.     Extremity/Trunk Assessment   Upper Extremity Assessment Upper Extremity Assessment: Generalized weakness    Lower Extremity Assessment Lower Extremity Assessment:  Generalized weakness    Cervical / Trunk Assessment Cervical / Trunk Assessment: Normal  Communication   Communication Communication: No apparent difficulties;Hearing impairment Cueing Techniques: Verbal cues;Tactile cues  Cognition Arousal: Alert Behavior During Therapy: WFL for tasks assessed/performed Overall Cognitive Status: Within Functional Limits for tasks assessed                                          General Comments General comments (skin integrity,  edema, etc.): HR up to 151 seated EOB. Pt in Afib. RN made aware.    Exercises     Assessment/Plan    PT Assessment Patient needs continued PT services  PT Problem List Decreased strength;Decreased range of motion;Decreased activity tolerance;Decreased balance;Decreased mobility;Decreased coordination;Decreased knowledge of use of DME;Decreased safety awareness;Decreased knowledge of precautions;Cardiopulmonary status limiting activity       PT Treatment Interventions DME instruction;Stair training;Gait training;Functional mobility training;Therapeutic activities;Therapeutic exercise;Balance training;Neuromuscular re-education;Patient/family education    PT Goals (Current goals can be found in the Care Plan section)  Acute Rehab PT Goals Patient Stated Goal: to go home PT Goal Formulation: With patient Time For Goal Achievement: 12/04/23 Potential to Achieve Goals: Good    Frequency Min 1X/week     Co-evaluation               AM-PAC PT "6 Clicks" Mobility  Outcome Measure Help needed turning from your back to your side while in a flat bed without using bedrails?: A Little Help needed moving from lying on your back to sitting on the side of a flat bed without using bedrails?: A Lot Help needed moving to and from a bed to a chair (including a wheelchair)?: A Lot Help needed standing up from a chair using your arms (e.g., wheelchair or bedside chair)?: A Lot Help needed to walk in hospital room?: Total Help needed climbing 3-5 steps with a railing? : Total 6 Click Score: 11    End of Session Equipment Utilized During Treatment: Gait belt Activity Tolerance: Treatment limited secondary to medical complications (Comment) (HR) Patient left: in bed;with call bell/phone within reach;with bed alarm set Nurse Communication: Mobility status;Other (comment) (HR) PT Visit Diagnosis: Other abnormalities of gait and mobility (R26.89)    Time: 1610-9604 PT Time Calculation (min)  (ACUTE ONLY): 29 min   Charges:   PT Evaluation $PT Eval Moderate Complexity: 1 Mod PT Treatments $Therapeutic Activity: 8-22 mins PT General Charges $$ ACUTE PT VISIT: 1 Visit         Shela Nevin, PT, DPT Acute Rehab Services [phone]   Gladys Damme 11/20/2023, 2:03 PM

## 2023-11-20 NOTE — Plan of Care (Signed)
  Problem: Education: Goal: Knowledge of General Education information will improve Description: Including pain rating scale, medication(s)/side effects and non-pharmacologic comfort measures Outcome: Progressing   Problem: Clinical Measurements: Goal: Will remain free from infection Outcome: Progressing Goal: Diagnostic test results will improve Outcome: Progressing Goal: Respiratory complications will improve Outcome: Progressing Goal: Cardiovascular complication will be avoided Outcome: Progressing   Problem: Activity: Goal: Risk for activity intolerance will decrease Outcome: Progressing   Problem: Nutrition: Goal: Adequate nutrition will be maintained Outcome: Progressing   Problem: Coping: Goal: Level of anxiety will decrease Outcome: Progressing   Problem: Elimination: Goal: Will not experience complications related to bowel motility Outcome: Progressing Goal: Will not experience complications related to urinary retention Outcome: Progressing   Problem: Pain Management: Goal: General experience of comfort will improve Outcome: Progressing   Problem: Safety: Goal: Ability to remain free from injury will improve Outcome: Progressing   Problem: Skin Integrity: Goal: Risk for impaired skin integrity will decrease Outcome: Progressing   Problem: Education: Goal: Knowledge of disease and its progression will improve Outcome: Progressing   Problem: Clinical Measurements: Goal: Dialysis access will remain free of complications Outcome: Progressing   Problem: Activity: Goal: Activity intolerance will improve Outcome: Progressing   Problem: Fluid Volume: Goal: Fluid volume balance will be maintained or improved Outcome: Progressing   Problem: Nutritional: Goal: Ability to make appropriate dietary choices will improve Outcome: Progressing   Problem: Respiratory: Goal: Respiratory symptoms related to disease process will be avoided Outcome: Progressing

## 2023-11-21 DIAGNOSIS — I4891 Unspecified atrial fibrillation: Secondary | ICD-10-CM | POA: Diagnosis not present

## 2023-11-21 LAB — CBC
HCT: 36.6 % (ref 36.0–46.0)
Hemoglobin: 12.3 g/dL (ref 12.0–15.0)
MCH: 35.9 pg — ABNORMAL HIGH (ref 26.0–34.0)
MCHC: 33.6 g/dL (ref 30.0–36.0)
MCV: 106.7 fL — ABNORMAL HIGH (ref 80.0–100.0)
Platelets: 167 10*3/uL (ref 150–400)
RBC: 3.43 MIL/uL — ABNORMAL LOW (ref 3.87–5.11)
RDW: 15.8 % — ABNORMAL HIGH (ref 11.5–15.5)
WBC: 8.1 10*3/uL (ref 4.0–10.5)
nRBC: 0 % (ref 0.0–0.2)

## 2023-11-21 LAB — CK: Total CK: 366 U/L — ABNORMAL HIGH (ref 38–234)

## 2023-11-21 LAB — CORTISOL-AM, BLOOD: Cortisol - AM: 15.2 ug/dL (ref 6.7–22.6)

## 2023-11-21 LAB — BASIC METABOLIC PANEL
Anion gap: 5 (ref 5–15)
BUN: 7 mg/dL — ABNORMAL LOW (ref 8–23)
CO2: 23 mmol/L (ref 22–32)
Calcium: 8.2 mg/dL — ABNORMAL LOW (ref 8.9–10.3)
Chloride: 108 mmol/L (ref 98–111)
Creatinine, Ser: 0.79 mg/dL (ref 0.44–1.00)
GFR, Estimated: >60 mL/min (ref >60)
Glucose, Bld: 91 mg/dL (ref 70–99)
Potassium: 3.8 mmol/L (ref 3.5–5.1)
Sodium: 136 mmol/L (ref 135–145)

## 2023-11-21 NOTE — NC FL2 (Signed)
[place] MEDICAID FL2 LEVEL OF CARE FORM     IDENTIFICATION  Patient Name: Stacy Valencia Birthdate: [birth year] Sex: female Admission Date (Current Location): 11/19/2023  [facility] and [place] Number:  Producer, television/film/video and Address:  [facility], [address], [place], [place] [address]      Provider Number: [id number]  Attending Physician Name and Address:  Starleen Arms, MD  Relative Name and Phone Number:  Roselie Bolash [phone]    Current Level of Care: Hospital Recommended Level of Care: Skilled Nursing Facility Prior Approval Number:    Date Approved/Denied:   PASRR Number: [id number]  Discharge Plan: SNF    Current Diagnoses: Patient Active Problem List   Diagnosis Date Noted   Atrial fibrillation with RVR (HCC) 11/20/2023   Fall 11/19/2023   Rhabdomyolysis 11/19/2023   Atrial fibrillation (HCC) 11/19/2023   SIRS (systemic inflammatory response syndrome) (HCC) 11/19/2023   Elevated troponin 11/19/2023   Acute respiratory failure with hypoxia (HCC) 11/19/2023   Transient hypotension 11/19/2023   Hyponatremia 11/19/2023   Abnormal TSH 11/19/2023   UTI (urinary tract infection) 08/13/2020   Adjustment disorder with anxious mood    Femur fracture, left (HCC) 07/25/2020   Supplemental oxygen dependent    AKI (acute kidney injury) (HCC)    Sinus tachycardia    Acute blood loss anemia    Postoperative pain    Hip fracture (HCC) 07/22/2020   History of adverse effect of venous thromboembolism (VTE) prophylaxis 07/22/2020   DVT (deep venous thrombosis) (HCC) 01/07/2013   Pulmonary embolism (HCC) 01/05/2013   GERD (gastroesophageal reflux disease) 01/05/2013    Orientation RESPIRATION BLADDER Height & Weight     Self, Time, Situation, Place  Normal Incontinent Weight: 155 lb 10.3 oz (70.6 kg) Height:  5' 5.5" (166.4 cm)  BEHAVIORAL SYMPTOMS/MOOD NEUROLOGICAL BOWEL NUTRITION STATUS      Continent Diet (see discharge  summary)  AMBULATORY STATUS COMMUNICATION OF NEEDS Skin   Limited Assist Verbally Normal                       Personal Care Assistance Level of Assistance  Bathing, Dressing Bathing Assistance: Limited assistance   Dressing Assistance: Limited assistance     Functional Limitations Info  Sight, Hearing Sight Info: Adequate Hearing Info: Adequate      SPECIAL CARE FACTORS FREQUENCY  PT (By licensed PT), OT (By licensed OT)     PT Frequency: 5x/wk OT Frequency: 5x/wk            Contractures      Additional Factors Info  Code Status, Allergies Code Status Info: FUL Allergies Info: Sulf Antibiotics           Current Medications (11/21/2023):  This is the current hospital active medication list Current Facility-Administered Medications  Medication Dose Route Frequency Provider Last Rate Last Admin   acetaminophen (TYLENOL) tablet 650 mg  650 mg Oral Q6H PRN Clydie Braun, MD       Or   acetaminophen (TYLENOL) suppository 650 mg  650 mg Rectal Q6H PRN Madelyn Flavors A, MD       albuterol (PROVENTIL) (2.5 MG/3ML) 0.083% nebulizer solution 2.5 mg  2.5 mg Nebulization Q6H PRN Smith, Rondell A, MD       metoprolol tartrate (LOPRESSOR) tablet 12.5 mg  12.5 mg Oral BID Armanda Magic R, MD   12.5 mg at 11/21/23 0821   pantoprazole (PROTONIX) EC tablet 40 mg  40  mg Oral Q1200 Maretta Bees, MD   40 mg at 11/21/23 1130   rivaroxaban (XARELTO) tablet 20 mg  20 mg Oral QAC supper Maretta Bees, MD   20 mg at 11/20/23 1746   sodium chloride flush (NS) 0.9 % injection 3 mL  3 mL Intravenous Q12H Madelyn Flavors A, MD   3 mL at 11/21/23 0160     Discharge Medications: Please see discharge summary for a list of discharge medications.  Relevant Imaging Results:  Relevant Lab Results:   Additional Information [id number]  Donnalee Curry, LCSWA

## 2023-11-21 NOTE — Progress Notes (Signed)
Physical Therapy Treatment Patient Details Name: Stacy Valencia MRN: [id number] DOB: 14-Nov-1933 Today's Date: 11/21/2023   History of Present Illness Patient is a 89 y.o.  female with history of PAF, VTE, chronic anticoagulation, chronic debility-walks with the help of a walker-presented after mechanical fall-she was found to have A-fib RVR.    PT Comments  Pt with fair tolerance to treatment today. Pt today required Mod A for bed mobility, +2 Min A to stand, and Min A for room ambulation with RW. Pt husband present and feels that he may not be able to provided level of assistance required. There fore updating DC recs to SNF. Pt and family agreeable. PT will continue to follow.    If plan is discharge home, recommend the following: A lot of help with walking and/or transfers;A little help with bathing/dressing/bathroom;Assistance with cooking/housework;Direct supervision/assist for medications management;Assist for transportation;Help with stairs or ramp for entrance   Can travel by private vehicle     No  Equipment Recommendations  Other (comment) (Per accepting facility)    Recommendations for Other Services       Precautions / Restrictions Precautions Precautions: Fall Restrictions Weight Bearing Restrictions: No     Mobility  Bed Mobility Overal bed mobility: Needs Assistance Bed Mobility: Supine to Sit, Sit to Supine     Supine to sit: Mod assist, HOB elevated, Used rails Sit to supine: Mod assist, HOB elevated, Used rails   General bed mobility comments: assistance with BLEs back into bed, assist to power up out of bed.    Transfers Overall transfer level: Needs assistance Equipment used: Rolling walker (2 wheels) Transfers: Sit to/from Stand Sit to Stand: +2 physical assistance, Min assist           General transfer comment: Multiple cues for hand placement. +2 Min A to power up.    Ambulation/Gait Ambulation/Gait assistance: Min assist Gait Distance  (Feet): 15 Feet Assistive device: Rolling walker (2 wheels) Gait Pattern/deviations: Trunk flexed, Decreased stride length, Step-through pattern Gait velocity: decreased     General Gait Details: No LOB noted however needed constant cues for proximity and safety with RW. Pt very naxious with mobility.   Stairs             Wheelchair Mobility     Tilt Bed    Modified Rankin (Stroke Patients Only)       Balance Overall balance assessment: Needs assistance Sitting-balance support: Feet supported, Bilateral upper extremity supported Sitting balance-Leahy Scale: Poor Sitting balance - Comments: EOB, very poor trunk control Postural control: Posterior lean, Right lateral lean, Left lateral lean Standing balance support: During functional activity, Reliant on assistive device for balance, Bilateral upper extremity supported Standing balance-Leahy Scale: Poor Standing balance comment: Reliant on RW                            Cognition Arousal: Alert Behavior During Therapy: WFL for tasks assessed/performed, Anxious Overall Cognitive Status: Within Functional Limits for tasks assessed                                          Exercises      General Comments General comments (skin integrity, edema, etc.): VSS on RA.      Pertinent Vitals/Pain Pain Assessment Pain Assessment: No/denies pain    Home Living  Prior Function            PT Goals (current goals can now be found in the care plan section) Progress towards PT goals: Progressing toward goals    Frequency    Min 1X/week      PT Plan      Co-evaluation              AM-PAC PT "6 Clicks" Mobility   Outcome Measure  Help needed turning from your back to your side while in a flat bed without using bedrails?: A Little Help needed moving from lying on your back to sitting on the side of a flat bed without using bedrails?: A  Lot Help needed moving to and from a bed to a chair (including a wheelchair)?: A Lot Help needed standing up from a chair using your arms (e.g., wheelchair or bedside chair)?: A Lot Help needed to walk in hospital room?: A Little Help needed climbing 3-5 steps with a railing? : Total 6 Click Score: 13    End of Session Equipment Utilized During Treatment: Gait belt Activity Tolerance: Patient limited by fatigue Patient left: in bed;with call bell/phone within reach;with bed alarm set;with family/visitor present Nurse Communication: Mobility status PT Visit Diagnosis: Other abnormalities of gait and mobility (R26.89)     Time: 1610-9604 PT Time Calculation (min) (ACUTE ONLY): 24 min  Charges:    $Gait Training: 8-22 mins $Therapeutic Activity: 8-22 mins PT General Charges $$ ACUTE PT VISIT: 1 Visit                     Shela Nevin, PT, DPT Acute Rehab Services [phone]    Gladys Damme 11/21/2023, 3:02 PM

## 2023-11-21 NOTE — TOC Progression Note (Signed)
Transition of Care [facility]) - Progression Note    Patient Details  Name: Stacy Valencia MRN: [id number] Date of Birth: 1933-12-03  [facility] CM/SW Contact  Donnalee Curry, LCSWA Phone Number: 11/21/2023, 3:10 PM  Clinical Narrative:     SW spoke with pt's husband Stacy Valencia [phone]) regarding updated recs for SNF. [name]/pt agreeable to SNF. No hx of SNF but hx at CIR. SW explained process and MCR guidelines. Pt/[name] have no preferred facility, just close to home. Pt will need PTAR at d/c, Stacy Valencia does not feel comfortable transporting.         Expected Discharge Plan and Services                                               Social Determinants of Health (SDOH) Interventions SDOH Screenings   Food Insecurity: No Food Insecurity (11/19/2023)  Housing: Low Risk  (11/19/2023)  Transportation Needs: No Transportation Needs (11/19/2023)  Utilities: Not At Risk (11/19/2023)  Tobacco Use: Low Risk  (01/03/2021)    Readmission Risk Interventions     No data to display

## 2023-11-21 NOTE — Progress Notes (Signed)
  prior history of afib, issues with RVR this admission setting of hypovolemia, rhabdo. Initially hypotensive, transiently on IV amio. BP's have improved with IVFs, IV amio stopped and now back on home lopressor 12.5mg  bid. Rates well contorlled She is on her home xarelto dosing. Echo was benign. Mild trop in setting of afib with RVR and rhabdo, echo benign and no ishcemic symptoms. No plans for ischemic testing   No additional cardiology recs, we will sign off inpatient care at this time. We will arrange outpatient f/u    For questions or updates, please contact [facility] Please consult [email or web address] for contact info under        Signed, Dina Rich, MD  11/21/2023, 10:34 AM

## 2023-11-21 NOTE — Progress Notes (Signed)
PROGRESS NOTE        PATIENT DETAILS Name: Stacy Valencia Age: 89 y.o. Sex: female Date of Birth: [birth year] Admit Date: 11/19/2023 Admitting Physician Dewayne Shorter Levora Dredge, MD WUJ:[id number], [name], MD (Inactive)  Brief Summary: Patient is a 89 y.o.  female with history of PAF, VTE, chronic anticoagulation, chronic debility-walks with the help of a walker-presented after mechanical fall-she was found to have A-fib RVR and subsequently admitted to the hospitalist service.  Significant events: 11/28>> mechanical fall-A-fib RVR-did not tolerate Cardizem due to soft BP-started on amiodarone infusion-cardiology consulted-admit to [facility]. 11/29>> back in sinus rhythm.  Significant studies: 11/28>> CT head: No acute intracranial abnormality 11/28>> CT abdomen/pelvis: No acute findings. 11/28>> CXR: No pneumonia  Significant microbiology data: 11/28>> blood culture: No growth  Procedures: None  Consults: Cardiology  Subjective: Denies any chest pain, shortness of breath, she reports generalized weakness and fatigue, husband at bedside reports her gait was unsteady. Objective: Vitals: Blood pressure (!) 117/56, pulse 73, temperature 98.1 F (36.7 C), temperature source Oral, resp. rate 19, height 5' 5.5" (1.664 m), weight 70.6 kg, SpO2 95%.   Exam:  Awake Alert, Oriented X 3, No new F.N deficits, extremely frail. Symmetrical Chest wall movement, Good air movement bilaterally, CTAB RRR,No Gallops,Rubs or new Murmurs, No Parasternal Heave +ve B.Sounds, Abd Soft, No tenderness, No rebound - guarding or rigidity. No Cyanosis, Clubbing or edema, No new Rash or bruise     Pertinent Labs/Radiology:    Latest Ref Rng & Units 11/21/2023    3:30 AM 11/20/2023    4:42 AM 11/19/2023    5:53 AM  CBC  WBC 4.0 - 10.5 K/uL 8.1  10.5  19.4   Hemoglobin 12.0 - 15.0 g/dL 78.2  95.6  21.3   Hematocrit 36.0 - 46.0 % 36.6  35.9  43.9   Platelets 150 - 400 K/uL 167  151   184     Lab Results  Component Value Date   NA 136 11/21/2023   K 3.8 11/21/2023   CL 108 11/21/2023   CO2 23 11/21/2023    Assessment/Plan:  PAF with RVR He remains sinus rhythm this morning, with intermittent paroxysmal A-fib Being transition from amiodarone to oral beta-blocker Continue Xarelto The echo with a preserved EF Cardiology following.  He is to continue her home dose Xarelto and beta-blockers  Mechanical fall Uses a walker at baseline-fell while transitioning from walker to wheelchair-stayed on the floor for several hours because she could not get up. No syncope Imaging negative for any major fractures. PT/OT eval -He is significantly frail, deconditioned, I have discussed with husband about going to subacute rehab, but they request patient to go home with home health.  Discussed with PT, they will evaluate again today.  Rhabdomyolysis Secondary to fall and lying on the floor for several hours when she fell. Mild Hydrated overnight CK trending down,  AKI Mild Resolved Follow electrolytes periodically  Hyponatremia Resolved  SIRS Leukocytosis however no source of infection CXR negative for pneumonia (no cough/URI symptoms-although procalcitonin elevated) UA negative for UTI Blood cultures negative On exam-no focus of infection apparent Stop antibiotics and observe  History of DVT/pulmonary embolism Xarelto  Borderline hypotension Asymptomatic Supportive care for now Cortisol this a.m. at 15 which is reassuring.  Probable sick euthyroid syndrome Repeat thyroid function test in 4 to 6 weeks  Chronic  debility/deconditioning PT/OT eval  Obesity: Estimated body mass index is 25.51 kg/m as calculated from the following:   Height as of this encounter: 5' 5.5" (1.664 m).   Weight as of this encounter: 70.6 kg.   Code status:   Code Status: Full Code   DVT Prophylaxis: rivaroxaban (XARELTO) tablet 20 mg    Family Communication: None at  bedside   Disposition Plan: Status is: Inpatient Remains inpatient appropriate because: Severity of illness   Planned Discharge Destination:Home health versus SNF   Diet: Diet Order             Diet Heart Room service appropriate? No; Fluid consistency: Thin  Diet effective now                     Antimicrobial agents: Anti-infectives (From admission, onward)    Start     Dose/Rate Route Frequency Ordered Stop   11/20/23 1500  vancomycin (VANCOREADY) IVPB 750 mg/150 mL  Status:  Discontinued        750 mg 150 mL/hr over 60 Minutes Intravenous Every 24 hours 11/19/23 1556 11/20/23 1040   11/19/23 1800  vancomycin (VANCOCIN) IVPB 1000 mg/200 mL premix       Placed in "Followed by" Linked Group   1,000 mg 200 mL/hr over 60 Minutes Intravenous  Once 11/19/23 1600 11/19/23 1837   11/19/23 1645  cefTRIAXone (ROCEPHIN) 2 g in sodium chloride 0.9 % 100 mL IVPB  Status:  Discontinued        2 g 200 mL/hr over 30 Minutes Intravenous Every 24 hours 11/19/23 1548 11/20/23 1040   11/19/23 1645  vancomycin (VANCOREADY) IVPB 2000 mg/400 mL  Status:  Discontinued        2,000 mg 200 mL/hr over 120 Minutes Intravenous  Once 11/19/23 1555 11/19/23 1600   11/19/23 1645  vancomycin (VANCOCIN) IVPB 1000 mg/200 mL premix       Placed in "Followed by" Linked Group   1,000 mg 200 mL/hr over 60 Minutes Intravenous  Once 11/19/23 1600 11/19/23 1833        MEDICATIONS: Scheduled Meds:  metoprolol tartrate  12.5 mg Oral BID   pantoprazole  40 mg Oral Q1200   rivaroxaban  20 mg Oral QAC supper   sodium chloride flush  3 mL Intravenous Q12H   Continuous Infusions:   PRN Meds:.acetaminophen **OR** acetaminophen, albuterol   I have personally reviewed following labs and imaging studies  LABORATORY DATA: CBC: Recent Labs  Lab 11/19/23 0553 11/20/23 0442 11/21/23 0330  WBC 19.4* 10.5 8.1  HGB 15.0 12.1 12.3  HCT 43.9 35.9* 36.6  MCV 106.3* 105.3* 106.7*  PLT 184 151 167     Basic Metabolic Panel: Recent Labs  Lab 11/19/23 0553 11/19/23 0802 11/19/23 1000 11/20/23 0442 11/21/23 0330  NA 131*  --   --  135 136  K 3.6  --   --  3.6 3.8  CL 99  --   --  109 108  CO2 18*  --   --  19* 23  GLUCOSE 113*  --   --  97 91  BUN 20  --   --  12 7*  CREATININE 1.30*  --   --  0.76 0.79  CALCIUM 9.1  --   --  7.6* 8.2*  MG  --  0.8* 1.5* 2.1  --     GFR: Estimated Creatinine Clearance: 48.5 mL/min (by C-G formula based on SCr of 0.79 mg/dL).  Liver Function Tests:  Recent Labs  Lab 11/19/23 0553  AST 56*  ALT 18  ALKPHOS 50  BILITOT 1.8*  PROT 5.9*  ALBUMIN 2.8*   Recent Labs  Lab 11/19/23 0553  LIPASE 19   No results for input(s): "AMMONIA" in the last 168 hours.  Coagulation Profile: No results for input(s): "INR", "PROTIME" in the last 168 hours.  Cardiac Enzymes: Recent Labs  Lab 11/19/23 0553 11/21/23 0330  CKTOTAL 1,741* 366*    BNP (last 3 results) No results for input(s): "PROBNP" in the last 8760 hours.  Lipid Profile: No results for input(s): "CHOL", "HDL", "LDLCALC", "TRIG", "CHOLHDL", "LDLDIRECT" in the last 72 hours.  Thyroid Function Tests: Recent Labs    11/19/23 0802 11/19/23 1000  TSH 4.847*  --   FREET4  --  1.69*    Anemia Panel: No results for input(s): "VITAMINB12", "FOLATE", "FERRITIN", "TIBC", "IRON", "RETICCTPCT" in the last 72 hours.  Urine analysis:    Component Value Date/Time   COLORURINE YELLOW 11/19/2023 1850   APPEARANCEUR CLEAR 11/19/2023 1850   LABSPEC 1.028 11/19/2023 1850   PHURINE 5.0 11/19/2023 1850   GLUCOSEU NEGATIVE 11/19/2023 1850   HGBUR NEGATIVE 11/19/2023 1850   BILIRUBINUR NEGATIVE 11/19/2023 1850   KETONESUR NEGATIVE 11/19/2023 1850   PROTEINUR NEGATIVE 11/19/2023 1850   NITRITE NEGATIVE 11/19/2023 1850   LEUKOCYTESUR NEGATIVE 11/19/2023 1850    Sepsis Labs: Lactic Acid, Venous    Component Value Date/Time   LATICACIDVEN 1.9 11/19/2023 1937     MICROBIOLOGY: Recent Results (from the past 240 hour(s))  Culture, blood (single) w Reflex to ID Panel     Status: None (Preliminary result)   Collection Time: 11/19/23 10:00 AM   Specimen: BLOOD RIGHT ARM  Result Value Ref Range Status   Specimen Description BLOOD RIGHT ARM  Final   Special Requests   Final    BOTTLES DRAWN AEROBIC AND ANAEROBIC Blood Culture results may not be optimal due to an inadequate volume of blood received in culture bottles   Culture   Final    NO GROWTH 2 DAYS Performed at [facility], [address] [place], [place] [address]    Report Status PENDING  Incomplete  MRSA Next Gen by PCR, Nasal     Status: None   Collection Time: 11/19/23  5:13 PM   Specimen: Nasal Mucosa; Nasal Swab  Result Value Ref Range Status   MRSA by PCR Next Gen NOT DETECTED NOT DETECTED Final    Comment: (NOTE) The GeneXpert MRSA Assay (FDA approved for NASAL specimens only), is one component of a comprehensive MRSA colonization surveillance program. It is not intended to diagnose MRSA infection nor to guide or monitor treatment for MRSA infections. Test performance is not FDA approved in patients less than 28 years old. Performed at [facility], [address] [place], [place] [address]     RADIOLOGY STUDIES/RESULTS: ECHOCARDIOGRAM COMPLETE  Result Date: 11/20/2023    ECHOCARDIOGRAM REPORT   Patient Name:   Stacy Valencia Carcione Date of Exam: 11/20/2023 Medical Rec #:  [id number]      Height:       65.5 in Accession #:    [id number]     Weight:       225.1 lb Date of Birth:  [birth year]      BSA:          2.092 m Patient Age:    89 years       BP:  131/74 mmHg Patient Gender: F              HR:           94 bpm. Exam Location:  Inpatient Procedure: 2D Echo, Cardiac Doppler, Color Doppler and Intracardiac            Opacification Agent Indications:    Elevated troponin  History:        Patient has no prior history of Echocardiogram examinations.                  Arrythmias:Atrial Fibrillation and Tachycardia;                 Signs/Symptoms:Hypotension.  Sonographer:    Vern Claude Referring Phys: [phone] TRACI R TURNER  Sonographer Comments: Image acquisition challenging due to respiratory motion. IMPRESSIONS  1. Left ventricular ejection fraction, by estimation, is 60 to 65%. The left ventricle has normal function. The left ventricle has no regional wall motion abnormalities. Left ventricular diastolic parameters are consistent with Grade I diastolic dysfunction (impaired relaxation).  2. Right ventricular systolic function is normal. The right ventricular size is normal. There is normal pulmonary artery systolic pressure.  3. The mitral valve is grossly normal. Trivial mitral valve regurgitation.  4. The aortic valve was not well visualized. There is moderate calcification of the aortic valve. Aortic valve regurgitation is mild to moderate.  5. The inferior vena cava is normal in size with greater than 50% respiratory variability, suggesting right atrial pressure of 3 mmHg. Comparison(s): No prior Echocardiogram. FINDINGS  Left Ventricle: Left ventricular ejection fraction, by estimation, is 60 to 65%. The left ventricle has normal function. The left ventricle has no regional wall motion abnormalities. The left ventricular internal cavity size was normal in size. There is  no left ventricular hypertrophy. Left ventricular diastolic parameters are consistent with Grade I diastolic dysfunction (impaired relaxation). Right Ventricle: The right ventricular size is normal. Right ventricular systolic function is normal. There is normal pulmonary artery systolic pressure. The tricuspid regurgitant velocity is 1.94 m/s, and with an assumed right atrial pressure of 3 mmHg,  the estimated right ventricular systolic pressure is 18.1 mmHg. Left Atrium: Left atrial size was normal in size. Right Atrium: Right atrial size was normal in size. Pericardium: There is no evidence of  pericardial effusion. Mitral Valve: The mitral valve is grossly normal. Trivial mitral valve regurgitation. MV peak gradient, 2.0 mmHg. The mean mitral valve gradient is 1.0 mmHg. Tricuspid Valve: Tricuspid valve regurgitation is trivial. Aortic Valve: The aortic valve was not well visualized. There is moderate calcification of the aortic valve. Aortic valve regurgitation is mild to moderate. Aortic regurgitation PHT measures 249 msec. Aortic valve mean gradient measures 2.0 mmHg. Aortic valve peak gradient measures 2.7 mmHg. Aortic valve area, by VTI measures 3.17 cm. Pulmonic Valve: Pulmonic valve regurgitation is not visualized. Aorta: The aortic root and ascending aorta are structurally normal, with no evidence of dilitation. Venous: The inferior vena cava is normal in size with greater than 50% respiratory variability, suggesting right atrial pressure of 3 mmHg. IAS/Shunts: The interatrial septum was not well visualized.  LEFT VENTRICLE PLAX 2D LVIDd:         4.80 cm     Diastology LVIDs:         3.10 cm     LV e' lateral:   9.03 cm/s LV PW:         0.90 cm     LV E/e' lateral: 5.0  LV IVS:        1.00 cm LVOT diam:     2.10 cm LV SV:         45 LV SV Index:   22 LVOT Area:     3.46 cm  LV Volumes (MOD) LV vol d, MOD A2C: 54.9 ml LV vol d, MOD A4C: 68.4 ml LV vol s, MOD A2C: 16.7 ml LV vol s, MOD A4C: 21.0 ml LV SV MOD A2C:     38.2 ml LV SV MOD A4C:     68.4 ml LV SV MOD BP:      42.5 ml RIGHT VENTRICLE RV Basal diam:  3.20 cm RV Mid diam:    1.90 cm RV S prime:     10.80 cm/s LEFT ATRIUM           Index       RIGHT ATRIUM          Index LA Vol (A2C): 16.0 ml 7.65 ml/m  RA Area:     6.89 cm LA Vol (A4C): 20.0 ml 9.56 ml/m  RA Volume:   11.30 ml 5.40 ml/m  AORTIC VALVE                    PULMONIC VALVE AV Area (Vmax):    3.19 cm     PV Vmax:       0.76 m/s AV Area (Vmean):   2.67 cm     PV Peak grad:  2.3 mmHg AV Area (VTI):     3.17 cm AV Vmax:           81.50 cm/s AV Vmean:          60.900 cm/s AV  VTI:            0.142 m AV Peak Grad:      2.7 mmHg AV Mean Grad:      2.0 mmHg LVOT Vmax:         75.00 cm/s LVOT Vmean:        47.000 cm/s LVOT VTI:          0.130 m LVOT/AV VTI ratio: 0.92 AI PHT:            249 msec  AORTA Ao Root diam: 3.70 cm Ao Asc diam:  3.40 cm MITRAL VALVE               TRICUSPID VALVE MV Area (PHT): 3.11 cm    TR Peak grad:   15.1 mmHg MV Area VTI:   2.81 cm    TR Vmax:        194.00 cm/s MV Peak grad:  2.0 mmHg MV Mean grad:  1.0 mmHg    SHUNTS MV Vmax:       0.70 m/s    Systemic VTI:  0.13 m MV Vmean:      56.1 cm/s   Systemic Diam: 2.10 cm MV Decel Time: 244 msec MV E velocity: 44.80 cm/s MV A velocity: 79.10 cm/s MV E/A ratio:  0.57 Photographer signed by Carolan Clines Signature Date/Time: 11/20/2023/2:03:18 PM    Final      LOS: 1 day   Huey Bienenstock, MD  [facility]    To contact the attending provider between 7A-7P or the covering provider during after hours 7P-7A, please log into the web site [email or web address] and access using universal [place] password for that web site. If you do not have the password, please call the hospital operator.  11/21/2023, 2:00 PM

## 2023-11-22 DIAGNOSIS — M6282 Rhabdomyolysis: Secondary | ICD-10-CM | POA: Diagnosis not present

## 2023-11-22 DIAGNOSIS — I4891 Unspecified atrial fibrillation: Secondary | ICD-10-CM | POA: Diagnosis not present

## 2023-11-22 MED ORDER — MIDODRINE HCL 5 MG PO TABS
5.0000 mg | ORAL_TABLET | Freq: Three times a day (TID) | ORAL | Status: DC
  Administered 2023-11-22 – 2023-11-24 (×7): 5 mg via ORAL
  Filled 2023-11-22 (×5): qty 1

## 2023-11-22 MED ORDER — METOPROLOL TARTRATE 25 MG PO TABS
25.0000 mg | ORAL_TABLET | Freq: Two times a day (BID) | ORAL | Status: DC
  Administered 2023-11-22: 25 mg via ORAL
  Filled 2023-11-22: qty 1

## 2023-11-22 MED ORDER — MIDODRINE HCL 5 MG PO TABS
2.5000 mg | ORAL_TABLET | Freq: Three times a day (TID) | ORAL | Status: DC
  Administered 2023-11-22 (×2): 2.5 mg via ORAL
  Filled 2023-11-22 (×2): qty 1

## 2023-11-22 NOTE — Progress Notes (Signed)
PROGRESS NOTE        PATIENT DETAILS Name: Stacy Valencia Age: 89 y.o. Sex: female Date of Birth: 12-30-1932 Admit Date: 11/19/2023 Admitting Physician Dewayne Shorter Stacy Dredge, MD EVO:[id number], Stacy Sarah, MD (Inactive)  Brief Summary: Patient is a 89 y.o.  female with history of PAF, VTE, chronic anticoagulation, chronic debility-walks with the help of a walker-presented after mechanical fall-she was found to have A-fib RVR and subsequently admitted to the hospitalist service.  Significant events: 11/28>> mechanical fall-A-fib RVR-did not tolerate Cardizem due to soft BP-started on amiodarone infusion-cardiology consulted-admit to [facility]. 11/29>> back in sinus rhythm.  Significant studies: 11/28>> CT head: No acute intracranial abnormality 11/28>> CT abdomen/pelvis: No acute findings. 11/28>> CXR: No pneumonia  Significant microbiology data: 11/28>> blood culture: No growth  Procedures: None  Consults: Cardiology  Subjective:  No chest pain, no shortness of breath, still with generalized weakness and fatigue   Objective: Vitals: Blood pressure (!) 97/52, pulse 61, temperature 97.9 F (36.6 C), temperature source Oral, resp. rate 20, height 5' 5.5" (1.664 m), weight 70.6 kg, SpO2 95%.   Exam:  Awake Alert, Oriented X 3, No new F.N deficits, extremely frail Symmetrical Chest wall movement, Good air movement bilaterally, CTAB RRR,No Gallops,Rubs or new Murmurs, No Parasternal Heave +ve B.Sounds, Abd Soft, No tenderness, No rebound - guarding or rigidity. No Cyanosis, Clubbing or edema, No new Rash or bruise      Pertinent Labs/Radiology:    Latest Ref Rng & Units 11/21/2023    3:30 AM 11/20/2023    4:42 AM 11/19/2023    5:53 AM  CBC  WBC 4.0 - 10.5 K/uL 8.1  10.5  19.4   Hemoglobin 12.0 - 15.0 g/dL 81.8  29.9  37.1   Hematocrit 36.0 - 46.0 % 36.6  35.9  43.9   Platelets 150 - 400 K/uL 167  151  184     Lab Results  Component Value Date   NA  136 11/21/2023   K 3.8 11/21/2023   CL 108 11/21/2023   CO2 23 11/21/2023    Assessment/Plan:  PAF with RVR -Initially on amiodarone drip, but she converted to sinus rhythm  -She remains with intermittent sinus tachycardia with minimal activity, as well is still in paroxysmal with brief episodes of A-fib, like to uptitrate her beta-blockers but her blood pressure is very soft, so I have discussed with cardiology, okay to start low-dose midodrine so we can uptitrate her beta-blockers, so she started on 5 mg p.o. midodrine, increase Toprol-XL to 25 mg p.o. twice daily Continue Xarelto The echo with a preserved EF Cardiology input greatly appreciated  Mechanical fall Uses a walker at baseline-fell while transitioning from walker to wheelchair-stayed on the floor for several hours because she could not get up. No syncope Imaging negative for any major fractures. PT/OT eval -Commendation for SNF.  Rhabdomyolysis Secondary to fall and lying on the floor for several hours when she fell. Mild Hydrated overnight CK trending down,  AKI Mild Resolved Follow electrolytes periodically  Hyponatremia Resolved  SIRS Infection-sepsis ruled out Leukocytosis however no source of infection CXR negative for pneumonia (no cough/URI symptoms-although procalcitonin elevated) UA negative for UTI Blood cultures negative On exam-no focus of infection apparent Stop antibiotics and observe  History of DVT/pulmonary embolism Xarelto  Borderline hypotension Asymptomatic Supportive care for now Cortisol reassuring at 15  Probable  sick euthyroid syndrome Repeat thyroid function test in 4 to 6 weeks  Chronic debility/deconditioning PT/OT eval  Obesity: Estimated body mass index is 25.51 kg/m as calculated from the following:   Height as of this encounter: 5' 5.5" (1.664 m).   Weight as of this encounter: 70.6 kg.   Code status:   Code Status: Full Code   DVT Prophylaxis: rivaroxaban  (XARELTO) tablet 20 mg    Family Communication: None at bedside   Disposition Plan: Status is: Inpatient Remains inpatient appropriate because: Severity of illness   Planned Discharge Destination:Home health versus SNF   Diet: Diet Order             Diet Heart Room service appropriate? No; Fluid consistency: Thin  Diet effective now                     Antimicrobial agents: Anti-infectives (From admission, onward)    Start     Dose/Rate Route Frequency Ordered Stop   11/20/23 1500  vancomycin (VANCOREADY) IVPB 750 mg/150 mL  Status:  Discontinued        750 mg 150 mL/hr over 60 Minutes Intravenous Every 24 hours 11/19/23 1556 11/20/23 1040   11/19/23 1800  vancomycin (VANCOCIN) IVPB 1000 mg/200 mL premix       Placed in "Followed by" Linked Group   1,000 mg 200 mL/hr over 60 Minutes Intravenous  Once 11/19/23 1600 11/19/23 1837   11/19/23 1645  cefTRIAXone (ROCEPHIN) 2 g in sodium chloride 0.9 % 100 mL IVPB  Status:  Discontinued        2 g 200 mL/hr over 30 Minutes Intravenous Every 24 hours 11/19/23 1548 11/20/23 1040   11/19/23 1645  vancomycin (VANCOREADY) IVPB 2000 mg/400 mL  Status:  Discontinued        2,000 mg 200 mL/hr over 120 Minutes Intravenous  Once 11/19/23 1555 11/19/23 1600   11/19/23 1645  vancomycin (VANCOCIN) IVPB 1000 mg/200 mL premix       Placed in "Followed by" Linked Group   1,000 mg 200 mL/hr over 60 Minutes Intravenous  Once 11/19/23 1600 11/19/23 1833        MEDICATIONS: Scheduled Meds:  metoprolol tartrate  25 mg Oral BID   midodrine  5 mg Oral TID WC   pantoprazole  40 mg Oral Q1200   rivaroxaban  20 mg Oral QAC supper   sodium chloride flush  3 mL Intravenous Q12H   Continuous Infusions:   PRN Meds:.acetaminophen **OR** acetaminophen, albuterol   I have personally reviewed following labs and imaging studies  LABORATORY DATA: CBC: Recent Labs  Lab 11/19/23 0553 11/20/23 0442 11/21/23 0330  WBC 19.4* 10.5 8.1  HGB  15.0 12.1 12.3  HCT 43.9 35.9* 36.6  MCV 106.3* 105.3* 106.7*  PLT 184 151 167    Basic Metabolic Panel: Recent Labs  Lab 11/19/23 0553 11/19/23 0802 11/19/23 1000 11/20/23 0442 11/21/23 0330  NA 131*  --   --  135 136  K 3.6  --   --  3.6 3.8  CL 99  --   --  109 108  CO2 18*  --   --  19* 23  GLUCOSE 113*  --   --  97 91  BUN 20  --   --  12 7*  CREATININE 1.30*  --   --  0.76 0.79  CALCIUM 9.1  --   --  7.6* 8.2*  MG  --  0.8* 1.5* 2.1  --  GFR: Estimated Creatinine Clearance: 48.5 mL/min (by C-G formula based on SCr of 0.79 mg/dL).  Liver Function Tests: Recent Labs  Lab 11/19/23 0553  AST 56*  ALT 18  ALKPHOS 50  BILITOT 1.8*  PROT 5.9*  ALBUMIN 2.8*   Recent Labs  Lab 11/19/23 0553  LIPASE 19   No results for input(s): "AMMONIA" in the last 168 hours.  Coagulation Profile: No results for input(s): "INR", "PROTIME" in the last 168 hours.  Cardiac Enzymes: Recent Labs  Lab 11/19/23 0553 11/21/23 0330  CKTOTAL 1,741* 366*    BNP (last 3 results) No results for input(s): "PROBNP" in the last 8760 hours.  Lipid Profile: No results for input(s): "CHOL", "HDL", "LDLCALC", "TRIG", "CHOLHDL", "LDLDIRECT" in the last 72 hours.  Thyroid Function Tests: No results for input(s): "TSH", "T4TOTAL", "FREET4", "T3FREE", "THYROIDAB" in the last 72 hours.   Anemia Panel: No results for input(s): "VITAMINB12", "FOLATE", "FERRITIN", "TIBC", "IRON", "RETICCTPCT" in the last 72 hours.  Urine analysis:    Component Value Date/Time   COLORURINE YELLOW 11/19/2023 1850   APPEARANCEUR CLEAR 11/19/2023 1850   LABSPEC 1.028 11/19/2023 1850   PHURINE 5.0 11/19/2023 1850   GLUCOSEU NEGATIVE 11/19/2023 1850   HGBUR NEGATIVE 11/19/2023 1850   BILIRUBINUR NEGATIVE 11/19/2023 1850   KETONESUR NEGATIVE 11/19/2023 1850   PROTEINUR NEGATIVE 11/19/2023 1850   NITRITE NEGATIVE 11/19/2023 1850   LEUKOCYTESUR NEGATIVE 11/19/2023 1850    Sepsis Labs: Lactic Acid,  Venous    Component Value Date/Time   LATICACIDVEN 1.9 11/19/2023 1937    MICROBIOLOGY: Recent Results (from the past 240 hour(s))  Culture, blood (single) w Reflex to ID Panel     Status: None (Preliminary result)   Collection Time: 11/19/23 10:00 AM   Specimen: BLOOD RIGHT ARM  Result Value Ref Range Status   Specimen Description BLOOD RIGHT ARM  Final   Special Requests   Final    BOTTLES DRAWN AEROBIC AND ANAEROBIC Blood Culture results may not be optimal due to an inadequate volume of blood received in culture bottles   Culture   Final    NO GROWTH 3 DAYS Performed at [facility], [address], [place], [place] [address]    Report Status PENDING  Incomplete  MRSA Next Gen by PCR, Nasal     Status: None   Collection Time: 11/19/23  5:13 PM   Specimen: Nasal Mucosa; Nasal Swab  Result Value Ref Range Status   MRSA by PCR Next Gen NOT DETECTED NOT DETECTED Final    Comment: (NOTE) The GeneXpert MRSA Assay (FDA approved for NASAL specimens only), is one component of a comprehensive MRSA colonization surveillance program. It is not intended to diagnose MRSA infection nor to guide or monitor treatment for MRSA infections. Test performance is not FDA approved in patients less than 44 years old. Performed at [facility], [address], [place], [place] [address]     RADIOLOGY STUDIES/RESULTS: ECHOCARDIOGRAM COMPLETE  Result Date: 11/20/2023    ECHOCARDIOGRAM REPORT   Patient Name:   Stacy Valencia Date of Exam: 11/20/2023 Medical Rec #:  [id number]      Height:       65.5 in Accession #:    [id number]     Weight:       225.1 lb Date of Birth:  [birth year]      BSA:          2.092 m Patient Age:    35 years  BP:           131/74 mmHg Patient Gender: F              HR:           94 bpm. Exam Location:  Inpatient Procedure: 2D Echo, Cardiac Doppler, Color Doppler and Intracardiac            Opacification Agent Indications:    Elevated troponin  History:         Patient has no prior history of Echocardiogram examinations.                 Arrythmias:Atrial Fibrillation and Tachycardia;                 Signs/Symptoms:Hypotension.  Sonographer:    Vern Claude Referring Phys: [phone] TRACI R TURNER  Sonographer Comments: Image acquisition challenging due to respiratory motion. IMPRESSIONS  1. Left ventricular ejection fraction, by estimation, is 60 to 65%. The left ventricle has normal function. The left ventricle has no regional wall motion abnormalities. Left ventricular diastolic parameters are consistent with Grade I diastolic dysfunction (impaired relaxation).  2. Right ventricular systolic function is normal. The right ventricular size is normal. There is normal pulmonary artery systolic pressure.  3. The mitral valve is grossly normal. Trivial mitral valve regurgitation.  4. The aortic valve was not well visualized. There is moderate calcification of the aortic valve. Aortic valve regurgitation is mild to moderate.  5. The inferior vena cava is normal in size with greater than 50% respiratory variability, suggesting right atrial pressure of 3 mmHg. Comparison(s): No prior Echocardiogram. FINDINGS  Left Ventricle: Left ventricular ejection fraction, by estimation, is 60 to 65%. The left ventricle has normal function. The left ventricle has no regional wall motion abnormalities. The left ventricular internal cavity size was normal in size. There is  no left ventricular hypertrophy. Left ventricular diastolic parameters are consistent with Grade I diastolic dysfunction (impaired relaxation). Right Ventricle: The right ventricular size is normal. Right ventricular systolic function is normal. There is normal pulmonary artery systolic pressure. The tricuspid regurgitant velocity is 1.94 m/s, and with an assumed right atrial pressure of 3 mmHg,  the estimated right ventricular systolic pressure is 18.1 mmHg. Left Atrium: Left atrial size was normal in size. Right Atrium: Right  atrial size was normal in size. Pericardium: There is no evidence of pericardial effusion. Mitral Valve: The mitral valve is grossly normal. Trivial mitral valve regurgitation. MV peak gradient, 2.0 mmHg. The mean mitral valve gradient is 1.0 mmHg. Tricuspid Valve: Tricuspid valve regurgitation is trivial. Aortic Valve: The aortic valve was not well visualized. There is moderate calcification of the aortic valve. Aortic valve regurgitation is mild to moderate. Aortic regurgitation PHT measures 249 msec. Aortic valve mean gradient measures 2.0 mmHg. Aortic valve peak gradient measures 2.7 mmHg. Aortic valve area, by VTI measures 3.17 cm. Pulmonic Valve: Pulmonic valve regurgitation is not visualized. Aorta: The aortic root and ascending aorta are structurally normal, with no evidence of dilitation. Venous: The inferior vena cava is normal in size with greater than 50% respiratory variability, suggesting right atrial pressure of 3 mmHg. IAS/Shunts: The interatrial septum was not well visualized.  LEFT VENTRICLE PLAX 2D LVIDd:         4.80 cm     Diastology LVIDs:         3.10 cm     LV e' lateral:   9.03 cm/s LV PW:  0.90 cm     LV E/e' lateral: 5.0 LV IVS:        1.00 cm LVOT diam:     2.10 cm LV SV:         45 LV SV Index:   22 LVOT Area:     3.46 cm  LV Volumes (MOD) LV vol d, MOD A2C: 54.9 ml LV vol d, MOD A4C: 68.4 ml LV vol s, MOD A2C: 16.7 ml LV vol s, MOD A4C: 21.0 ml LV SV MOD A2C:     38.2 ml LV SV MOD A4C:     68.4 ml LV SV MOD BP:      42.5 ml RIGHT VENTRICLE RV Basal diam:  3.20 cm RV Mid diam:    1.90 cm RV S prime:     10.80 cm/s LEFT ATRIUM           Index       RIGHT ATRIUM          Index LA Vol (A2C): 16.0 ml 7.65 ml/m  RA Area:     6.89 cm LA Vol (A4C): 20.0 ml 9.56 ml/m  RA Volume:   11.30 ml 5.40 ml/m  AORTIC VALVE                    PULMONIC VALVE AV Area (Vmax):    3.19 cm     PV Vmax:       0.76 m/s AV Area (Vmean):   2.67 cm     PV Peak grad:  2.3 mmHg AV Area (VTI):     3.17  cm AV Vmax:           81.50 cm/s AV Vmean:          60.900 cm/s AV VTI:            0.142 m AV Peak Grad:      2.7 mmHg AV Mean Grad:      2.0 mmHg LVOT Vmax:         75.00 cm/s LVOT Vmean:        47.000 cm/s LVOT VTI:          0.130 m LVOT/AV VTI ratio: 0.92 AI PHT:            249 msec  AORTA Ao Root diam: 3.70 cm Ao Asc diam:  3.40 cm MITRAL VALVE               TRICUSPID VALVE MV Area (PHT): 3.11 cm    TR Peak grad:   15.1 mmHg MV Area VTI:   2.81 cm    TR Vmax:        194.00 cm/s MV Peak grad:  2.0 mmHg MV Mean grad:  1.0 mmHg    SHUNTS MV Vmax:       0.70 m/s    Systemic VTI:  0.13 m MV Vmean:      56.1 cm/s   Systemic Diam: 2.10 cm MV Decel Time: 244 msec MV E velocity: 44.80 cm/s MV A velocity: 79.10 cm/s MV E/A ratio:  0.57 Photographer signed by Carolan Clines Signature Date/Time: 11/20/2023/2:03:18 PM    Final      LOS: 2 days   Huey Bienenstock, MD  [facility]    To contact the attending provider between 7A-7P or the covering provider during after hours 7P-7A, please log into the web site [email or web address] and access using universal [place] password for that web site. If you  do not have the password, please call the hospital operator.  11/22/2023, 12:54 PM

## 2023-11-22 NOTE — TOC CAGE-AID Note (Signed)
Transition of Care [facility]) - CAGE-AID Screening  Patient Details  Name: Stacy Valencia MRN: [id number] Date of Birth: 08/05/1933  Clinical Narrative:  Patient denies any alcohol or drug use, no need to provide substance abuse resources at this time.  CAGE-AID Screening:   Have You Ever Felt You Ought to Cut Down on Your Drinking or Drug Use?: No Have People Annoyed You By Critizing Your Drinking Or Drug Use?: No Have You Felt Bad Or Guilty About Your Drinking Or Drug Use?: No Have You Ever Had a Drink or Used Drugs First Thing In The Morning to Steady Your Nerves or to Get Rid of a Hangover?: No CAGE-AID Score: 0  Substance Abuse Education Offered: No

## 2023-11-22 NOTE — Plan of Care (Signed)

## 2023-11-22 NOTE — Plan of Care (Signed)
  Problem: Education: Goal: Knowledge of General Education information will improve Description: Including pain rating scale, medication(s)/side effects and non-pharmacologic comfort measures Outcome: Progressing   Problem: Health Behavior/Discharge Planning: Goal: Ability to manage health-related needs will improve Outcome: Progressing   Problem: Clinical Measurements: Goal: Ability to maintain clinical measurements within normal limits will improve Outcome: Progressing Goal: Will remain free from infection Outcome: Progressing Goal: Diagnostic test results will improve Outcome: Progressing Goal: Respiratory complications will improve Outcome: Progressing Goal: Cardiovascular complication will be avoided Outcome: Progressing   Problem: Activity: Goal: Risk for activity intolerance will decrease Outcome: Progressing   Problem: Nutrition: Goal: Adequate nutrition will be maintained Outcome: Progressing   Problem: Coping: Goal: Level of anxiety will decrease Outcome: Progressing   Problem: Elimination: Goal: Will not experience complications related to bowel motility Outcome: Progressing Goal: Will not experience complications related to urinary retention Outcome: Progressing   Problem: Pain Management: Goal: General experience of comfort will improve Outcome: Progressing   Problem: Safety: Goal: Ability to remain free from injury will improve Outcome: Progressing   Problem: Skin Integrity: Goal: Risk for impaired skin integrity will decrease Outcome: Progressing   Problem: Education: Goal: Knowledge of disease and its progression will improve Outcome: Progressing   Problem: Health Behavior/Discharge Planning: Goal: Ability to manage health-related needs will improve Outcome: Progressing   Problem: Clinical Measurements: Goal: Complications related to the disease process or treatment will be avoided or minimized Outcome: Progressing Goal: Dialysis access  will remain free of complications Outcome: Progressing   Problem: Activity: Goal: Activity intolerance will improve Outcome: Progressing   Problem: Fluid Volume: Goal: Fluid volume balance will be maintained or improved Outcome: Progressing   Problem: Nutritional: Goal: Ability to make appropriate dietary choices will improve Outcome: Progressing   Problem: Respiratory: Goal: Respiratory symptoms related to disease process will be avoided Outcome: Progressing   Problem: Self-Concept: Goal: Body image disturbance will be avoided or minimized Outcome: Progressing   Problem: Urinary Elimination: Goal: Progression of disease will be identified and treated Outcome: Progressing

## 2023-11-23 DIAGNOSIS — I4891 Unspecified atrial fibrillation: Secondary | ICD-10-CM | POA: Diagnosis not present

## 2023-11-23 MED ORDER — METOPROLOL TARTRATE 12.5 MG HALF TABLET
12.5000 mg | ORAL_TABLET | Freq: Two times a day (BID) | ORAL | Status: DC
  Administered 2023-11-23 – 2023-11-24 (×3): 12.5 mg via ORAL
  Filled 2023-11-23 (×3): qty 1

## 2023-11-23 NOTE — Progress Notes (Signed)
Occupational Therapy Treatment Patient Details Name: Stacy Valencia MRN: [id number] DOB: [birth year] Today's Date: 11/23/2023   History of present illness The pt is an 89 yo female presenting 11/28 after fall at home, down 7 hours until EMS arrived to assist. Work up revealed pt in afib with RVR PMH includes: afib, UTI, falls, osteoporosis, PE, GERD, L hip IM nail 2021.   OT comments  Pt with increased weakness, fatigues easily. Poor appetite, but able to self feed with set up and wash face and hands at bed level with set up. Mod assist for bed mobility. Demonstrated poor sitting balance while participating in UB dressing with mod assist. Pt unable to stand with one person assist this visit. Updated d/c recommendation. Patient will benefit from continued inpatient follow up therapy, <3 hours/day .      If plan is discharge home, recommend the following:  Two people to help with walking and/or transfers;A lot of help with bathing/dressing/bathroom;Assistance with cooking/housework;Assistance with feeding;Direct supervision/assist for medications management;Direct supervision/assist for financial management;Assist for transportation;Help with stairs or ramp for entrance   Equipment Recommendations  Hospital bed;Hoyer lift    Recommendations for Other Services      Precautions / Restrictions Precautions Precautions: Fall Precaution Comments: watch HR Restrictions Weight Bearing Restrictions: No       Mobility Bed Mobility Overal bed mobility: Needs Assistance Bed Mobility: Supine to Sit, Sit to Supine     Supine to sit: Mod assist, HOB elevated, Used rails Sit to supine: Mod assist, HOB elevated, Used rails   General bed mobility comments: assist for LEs over EOB and to raise trunk    Transfers                   General transfer comment: unable     Balance Overall balance assessment: Needs assistance Sitting-balance support: Feet supported, Bilateral upper extremity  supported Sitting balance-Leahy Scale: Poor Sitting balance - Comments: R and posterior lean requiring mod assist initially, progressed to CGA statically                                   ADL either performed or assessed with clinical judgement   ADL Overall ADL's : Needs assistance/impaired;At baseline Eating/Feeding: Set up;Bed level   Grooming: Wash/dry hands;Wash/dry face;Bed level;Set up           Upper Body Dressing : Moderate assistance;Sitting                          Extremity/Trunk Assessment              Vision       Perception     Praxis      Cognition Arousal: Alert Behavior During Therapy: WFL for tasks assessed/performed, Anxious                                   General Comments: decreased insight into importance of activity to prepare to return home        Exercises      Shoulder Instructions       General Comments      Pertinent Vitals/ Pain       Pain Assessment Pain Assessment: No/denies pain  Home Living  Prior Functioning/Environment              Frequency  Min 1X/week        Progress Toward Goals  OT Goals(current goals can now be found in the care plan section)  Progress towards OT goals: Not progressing toward goals - comment  Acute Rehab OT Goals OT Goal Formulation: With patient/family Time For Goal Achievement: 12/04/23 Potential to Achieve Goals: Good  Plan      Co-evaluation                 AM-PAC OT "6 Clicks" Daily Activity     Outcome Measure   Help from another person eating meals?: A Little Help from another person taking care of personal grooming?: A Little Help from another person toileting, which includes using toliet, bedpan, or urinal?: Total Help from another person bathing (including washing, rinsing, drying)?: A Lot Help from another person to put on and taking off regular upper  body clothing?: A Lot Help from another person to put on and taking off regular lower body clothing?: Total 6 Click Score: 12    End of Session    OT Visit Diagnosis: Unsteadiness on feet (R26.81);Other abnormalities of gait and mobility (R26.89);Muscle weakness (generalized) (M62.81)   Activity Tolerance Patient limited by fatigue   Patient Left in bed;with call bell/phone within reach;with bed alarm set   Nurse Communication          Time: 7829-5621 OT Time Calculation (min): 47 min  Charges: OT General Charges $OT Visit: 1 Visit OT Treatments $Self Care/Home Management : 8-22 mins $Therapeutic Activity: 23-37 mins  Berna Spare, OTR/L Acute Rehabilitation Services Office: [phone]   Evern Bio 11/23/2023, 3:40 PM

## 2023-11-23 NOTE — Plan of Care (Signed)
  Problem: Education: Goal: Knowledge of General Education information will improve Description: Including pain rating scale, medication(s)/side effects and non-pharmacologic comfort measures Outcome: Progressing   Problem: Health Behavior/Discharge Planning: Goal: Ability to manage health-related needs will improve Outcome: Progressing   Problem: Clinical Measurements: Goal: Ability to maintain clinical measurements within normal limits will improve Outcome: Progressing Goal: Will remain free from infection Outcome: Progressing Goal: Diagnostic test results will improve Outcome: Progressing Goal: Respiratory complications will improve Outcome: Progressing Goal: Cardiovascular complication will be avoided Outcome: Progressing   Problem: Activity: Goal: Risk for activity intolerance will decrease Outcome: Progressing   Problem: Nutrition: Goal: Adequate nutrition will be maintained Outcome: Progressing   Problem: Coping: Goal: Level of anxiety will decrease Outcome: Progressing   Problem: Elimination: Goal: Will not experience complications related to bowel motility Outcome: Progressing Goal: Will not experience complications related to urinary retention Outcome: Progressing   Problem: Pain Management: Goal: General experience of comfort will improve Outcome: Progressing   Problem: Safety: Goal: Ability to remain free from injury will improve Outcome: Progressing   Problem: Skin Integrity: Goal: Risk for impaired skin integrity will decrease Outcome: Progressing   Problem: Education: Goal: Knowledge of disease and its progression will improve Outcome: Progressing   Problem: Health Behavior/Discharge Planning: Goal: Ability to manage health-related needs will improve Outcome: Progressing   Problem: Clinical Measurements: Goal: Complications related to the disease process or treatment will be avoided or minimized Outcome: Progressing Goal: Dialysis access  will remain free of complications Outcome: Progressing   Problem: Activity: Goal: Activity intolerance will improve Outcome: Progressing   Problem: Fluid Volume: Goal: Fluid volume balance will be maintained or improved Outcome: Progressing   Problem: Nutritional: Goal: Ability to make appropriate dietary choices will improve Outcome: Progressing   Problem: Respiratory: Goal: Respiratory symptoms related to disease process will be avoided Outcome: Progressing   Problem: Self-Concept: Goal: Body image disturbance will be avoided or minimized Outcome: Progressing   Problem: Urinary Elimination: Goal: Progression of disease will be identified and treated Outcome: Progressing

## 2023-11-23 NOTE — Progress Notes (Signed)
PROGRESS NOTE        PATIENT DETAILS Name: CARISA TARANTOLA Age: 89 y.o. Sex: female Date of Birth: 1933-04-05 Admit Date: 11/19/2023 Admitting Physician Dewayne Shorter Levora Dredge, MD UEA:[id number], [name], MD (Inactive)  Brief Summary: Patient is a 89 y.o.  female with history of PAF, VTE, chronic anticoagulation, chronic debility-walks with the help of a walker-presented after mechanical fall-she was found to have A-fib RVR and subsequently admitted to the hospitalist service.  Significant events: 11/28>> mechanical fall-A-fib RVR-did not tolerate Cardizem due to soft BP-started on amiodarone infusion-cardiology consulted-admit to [facility]. 11/29>> back in sinus rhythm.  Significant studies: 11/28>> CT head: No acute intracranial abnormality 11/28>> CT abdomen/pelvis: No acute findings. 11/28>> CXR: No pneumonia  Significant microbiology data: 11/28>> blood culture: No growth  Procedures: None  Consults: Cardiology  Subjective:  Dyspnea, no chest pain, has generalized weakness.   Objective: Vitals: Blood pressure 104/71, pulse 89, temperature 97.7 F (36.5 C), temperature source Oral, resp. rate 18, height 5' 5.5" (1.664 m), weight 70.6 kg, SpO2 90%.   Exam:  Awake Alert, Oriented X 3, frail Symmetrical Chest wall movement, Good air movement bilaterally, CTAB RRR,No Gallops,Rubs or new Murmurs, No Parasternal Heave +ve B.Sounds, Abd Soft, No tenderness, No rebound - guarding or rigidity. No Cyanosis, Clubbing or edema, No new Rash or bruise       Pertinent Labs/Radiology:    Latest Ref Rng & Units 11/21/2023    3:30 AM 11/20/2023    4:42 AM 11/19/2023    5:53 AM  CBC  WBC 4.0 - 10.5 K/uL 8.1  10.5  19.4   Hemoglobin 12.0 - 15.0 g/dL 19.1  47.8  29.5   Hematocrit 36.0 - 46.0 % 36.6  35.9  43.9   Platelets 150 - 400 K/uL 167  151  184     Lab Results  Component Value Date   NA 136 11/21/2023   K 3.8 11/21/2023   CL 108 11/21/2023   CO2 23  11/21/2023    Assessment/Plan:  PAF with RVR -Initially on amiodarone drip, but she converted to sinus rhythm  -She could not tolerate increasing her metoprolol as blood pressure currently very soft, so we will decrease back to home dose at 12.5 mg p.o. twice daily .  Continue her on midodrine 5 mg p.o. 3 times daily. -Continue Xarelto The echo with a preserved EF Cardiology input greatly appreciated  Mechanical fall Uses a walker at baseline-fell while transitioning from walker to wheelchair-stayed on the floor for several hours because she could not get up. No syncope Imaging negative for any major fractures. PT/OT eval -Commendation for SNF.  Rhabdomyolysis Secondary to fall and lying on the floor for several hours when she fell. Mild Hydrated overnight CK trending down,  AKI Mild Resolved Follow electrolytes periodically  Hyponatremia Resolved  SIRS Infection-sepsis ruled out Leukocytosis however no source of infection CXR negative for pneumonia (no cough/URI symptoms-although procalcitonin elevated) UA negative for UTI Blood cultures negative On exam-no focus of infection apparent Stop antibiotics and observe  History of DVT/pulmonary embolism Xarelto  Borderline hypotension Asymptomatic Supportive care for now Cortisol reassuring at 15  Probable sick euthyroid syndrome Repeat thyroid function test in 4 to 6 weeks  Chronic debility/deconditioning PT/OT eval  Obesity: Estimated body mass index is 25.51 kg/m as calculated from the following:   Height as of this  encounter: 5' 5.5" (1.664 m).   Weight as of this encounter: 70.6 kg.   Code status:   Code Status: Full Code   DVT Prophylaxis: rivaroxaban (XARELTO) tablet 20 mg    Family Communication: None at bedside   Disposition Plan: Status is: Inpatient Patient awaiting SNF bed availability   Planned Discharge Destination:Home health versus SNF   Diet: Diet Order             Diet  Heart Room service appropriate? No; Fluid consistency: Thin  Diet effective now                     Antimicrobial agents: Anti-infectives (From admission, onward)    Start     Dose/Rate Route Frequency Ordered Stop   11/20/23 1500  vancomycin (VANCOREADY) IVPB 750 mg/150 mL  Status:  Discontinued        750 mg 150 mL/hr over 60 Minutes Intravenous Every 24 hours 11/19/23 1556 11/20/23 1040   11/19/23 1800  vancomycin (VANCOCIN) IVPB 1000 mg/200 mL premix       Placed in "Followed by" Linked Group   1,000 mg 200 mL/hr over 60 Minutes Intravenous  Once 11/19/23 1600 11/19/23 1837   11/19/23 1645  cefTRIAXone (ROCEPHIN) 2 g in sodium chloride 0.9 % 100 mL IVPB  Status:  Discontinued        2 g 200 mL/hr over 30 Minutes Intravenous Every 24 hours 11/19/23 1548 11/20/23 1040   11/19/23 1645  vancomycin (VANCOREADY) IVPB 2000 mg/400 mL  Status:  Discontinued        2,000 mg 200 mL/hr over 120 Minutes Intravenous  Once 11/19/23 1555 11/19/23 1600   11/19/23 1645  vancomycin (VANCOCIN) IVPB 1000 mg/200 mL premix       Placed in "Followed by" Linked Group   1,000 mg 200 mL/hr over 60 Minutes Intravenous  Once 11/19/23 1600 11/19/23 1833        MEDICATIONS: Scheduled Meds:  metoprolol tartrate  12.5 mg Oral BID   midodrine  5 mg Oral TID WC   pantoprazole  40 mg Oral Q1200   rivaroxaban  20 mg Oral QAC supper   sodium chloride flush  3 mL Intravenous Q12H   Continuous Infusions:   PRN Meds:.acetaminophen **OR** acetaminophen, albuterol   I have personally reviewed following labs and imaging studies  LABORATORY DATA: CBC: Recent Labs  Lab 11/19/23 0553 11/20/23 0442 11/21/23 0330  WBC 19.4* 10.5 8.1  HGB 15.0 12.1 12.3  HCT 43.9 35.9* 36.6  MCV 106.3* 105.3* 106.7*  PLT 184 151 167    Basic Metabolic Panel: Recent Labs  Lab 11/19/23 0553 11/19/23 0802 11/19/23 1000 11/20/23 0442 11/21/23 0330  NA 131*  --   --  135 136  K 3.6  --   --  3.6 3.8  CL 99   --   --  109 108  CO2 18*  --   --  19* 23  GLUCOSE 113*  --   --  97 91  BUN 20  --   --  12 7*  CREATININE 1.30*  --   --  0.76 0.79  CALCIUM 9.1  --   --  7.6* 8.2*  MG  --  0.8* 1.5* 2.1  --     GFR: Estimated Creatinine Clearance: 48.5 mL/min (by C-G formula based on SCr of 0.79 mg/dL).  Liver Function Tests: Recent Labs  Lab 11/19/23 0553  AST 56*  ALT 18  ALKPHOS 50  BILITOT 1.8*  PROT 5.9*  ALBUMIN 2.8*   Recent Labs  Lab 11/19/23 0553  LIPASE 19   No results for input(s): "AMMONIA" in the last 168 hours.  Coagulation Profile: No results for input(s): "INR", "PROTIME" in the last 168 hours.  Cardiac Enzymes: Recent Labs  Lab 11/19/23 0553 11/21/23 0330  CKTOTAL 1,741* 366*    BNP (last 3 results) No results for input(s): "PROBNP" in the last 8760 hours.  Lipid Profile: No results for input(s): "CHOL", "HDL", "LDLCALC", "TRIG", "CHOLHDL", "LDLDIRECT" in the last 72 hours.  Thyroid Function Tests: No results for input(s): "TSH", "T4TOTAL", "FREET4", "T3FREE", "THYROIDAB" in the last 72 hours.   Anemia Panel: No results for input(s): "VITAMINB12", "FOLATE", "FERRITIN", "TIBC", "IRON", "RETICCTPCT" in the last 72 hours.  Urine analysis:    Component Value Date/Time   COLORURINE YELLOW 11/19/2023 1850   APPEARANCEUR CLEAR 11/19/2023 1850   LABSPEC 1.028 11/19/2023 1850   PHURINE 5.0 11/19/2023 1850   GLUCOSEU NEGATIVE 11/19/2023 1850   HGBUR NEGATIVE 11/19/2023 1850   BILIRUBINUR NEGATIVE 11/19/2023 1850   KETONESUR NEGATIVE 11/19/2023 1850   PROTEINUR NEGATIVE 11/19/2023 1850   NITRITE NEGATIVE 11/19/2023 1850   LEUKOCYTESUR NEGATIVE 11/19/2023 1850    Sepsis Labs: Lactic Acid, Venous    Component Value Date/Time   LATICACIDVEN 1.9 11/19/2023 1937    MICROBIOLOGY: Recent Results (from the past 240 hour(s))  Culture, blood (single) w Reflex to ID Panel     Status: None (Preliminary result)   Collection Time: 11/19/23 10:00 AM    Specimen: BLOOD RIGHT ARM  Result Value Ref Range Status   Specimen Description BLOOD RIGHT ARM  Final   Special Requests   Final    BOTTLES DRAWN AEROBIC AND ANAEROBIC Blood Culture results may not be optimal due to an inadequate volume of blood received in culture bottles   Culture   Final    NO GROWTH 4 DAYS Performed at [facility], [address] [place], [place] [address]    Report Status PENDING  Incomplete  MRSA Next Gen by PCR, Nasal     Status: None   Collection Time: 11/19/23  5:13 PM   Specimen: Nasal Mucosa; Nasal Swab  Result Value Ref Range Status   MRSA by PCR Next Gen NOT DETECTED NOT DETECTED Final    Comment: (NOTE) The GeneXpert MRSA Assay (FDA approved for NASAL specimens only), is one component of a comprehensive MRSA colonization surveillance program. It is not intended to diagnose MRSA infection nor to guide or monitor treatment for MRSA infections. Test performance is not FDA approved in patients less than 59 years old. Performed at [facility], [address] [place], [place] [address]     RADIOLOGY STUDIES/RESULTS: No results found.   LOS: 3 days   Huey Bienenstock, MD  [facility]    To contact the attending provider between 7A-7P or the covering provider during after hours 7P-7A, please log into the web site [email or web address] and access using universal [place] password for that web site. If you do not have the password, please call the hospital operator.  11/23/2023, 11:35 AM

## 2023-11-23 NOTE — Progress Notes (Signed)
Physical Therapy Treatment Patient Details Name: Stacy Valencia MRN: [id number] DOB: [birth year] Today's Date: 11/23/2023   History of Present Illness The pt is an 89 yo female presenting 11/28 after fall at home, down 7 hours until EMS arrived to assist. Work up revealed pt in afib with RVR PMH includes: afib, UTI, falls, osteoporosis, PE, GERD, L hip IM nail 2021.    PT Comments  The pt was agreeable to limited session, reports fatigue through session which limited her ability to participate in OOB mobility this morning. Pt with HR elevation up to 143bpm with attempts to stand, but she was unable to achieve standing despite maxA from therapist and elevated edge of bed. The pt required mod-maxA to scoot laterally along EOB and declined all further attempts at mobility/exercise due to fatigue. Will continue to benefit from skilled PT acutely to progress mobility, power, and activity tolerance. Pt reports she will be able to mobilize once she returns home, but I feel she currently requires more assistance than her spouse would safely and consistently be able to provide.    If plan is discharge home, recommend the following: Assistance with cooking/housework;Direct supervision/assist for medications management;Assist for transportation;Help with stairs or ramp for entrance;Two people to help with walking and/or transfers;A lot of help with bathing/dressing/bathroom   Can travel by private vehicle     No  Equipment Recommendations  None recommended by PT (possibly hoyer lift if d/c home)    Recommendations for Other Services       Precautions / Restrictions Precautions Precautions: Fall Precaution Comments: watch HR elevation Restrictions Weight Bearing Restrictions: No     Mobility  Bed Mobility Overal bed mobility: Needs Assistance Bed Mobility: Supine to Sit, Sit to Supine     Supine to sit: Mod assist, HOB elevated, Used rails Sit to supine: Mod assist, HOB elevated, Used rails    General bed mobility comments: modA to assist LE and trunk, strong R and posterior lean needing modA to steady at EOB    Transfers Overall transfer level: Needs assistance Equipment used: Rolling walker (2 wheels) Transfers: Sit to/from Stand, Bed to chair/wheelchair/BSC Sit to Stand: Max assist, Total assist, From elevated surface          Lateral/Scoot Transfers: Mod assist, Max assist General transfer comment: pt with no attempt to assist on stand, unable to clear hips despite totalA, attempted x2. pt then assisted to laterally scoot along EOB x5 with initially modA but progressed to maxA    Ambulation/Gait               General Gait Details: pt unable to achieve standing today      Balance Overall balance assessment: Needs assistance Sitting-balance support: Feet supported, Bilateral upper extremity supported Sitting balance-Leahy Scale: Poor Sitting balance - Comments: R and posterior lean, modA progressing to moments of CGA                                    Cognition Arousal: Alert Behavior During Therapy: WFL for tasks assessed/performed, Anxious Overall Cognitive Status: Within Functional Limits for tasks assessed                                 General Comments: pt oriented, poor understanding of importance of exercise for recovery or insight to need for assistance, but able to answer orientation  questions appropriately        Exercises      General Comments General comments (skin integrity, edema, etc.): HR to 143bpm, pt reports extreme fatigue      Pertinent Vitals/Pain Pain Assessment Pain Assessment: No/denies pain     PT Goals (current goals can now be found in the care plan section) Acute Rehab PT Goals Patient Stated Goal: to go home PT Goal Formulation: With patient Time For Goal Achievement: 12/04/23 Potential to Achieve Goals: Good Progress towards PT goals: Not progressing toward goals - comment (more  fatigued)    Frequency    Min 1X/week       AM-PAC PT "6 Clicks" Mobility   Outcome Measure  Help needed turning from your back to your side while in a flat bed without using bedrails?: A Lot Help needed moving from lying on your back to sitting on the side of a flat bed without using bedrails?: A Lot Help needed moving to and from a bed to a chair (including a wheelchair)?: Total Help needed standing up from a chair using your arms (e.g., wheelchair or bedside chair)?: Total Help needed to walk in hospital room?: Total Help needed climbing 3-5 steps with a railing? : Total 6 Click Score: 8    End of Session Equipment Utilized During Treatment: Gait belt Activity Tolerance: Patient limited by fatigue Patient left: in bed;with call bell/phone within reach;with bed alarm set Nurse Communication: Mobility status PT Visit Diagnosis: Other abnormalities of gait and mobility (R26.89)     Time: 5784-6962 PT Time Calculation (min) (ACUTE ONLY): 29 min  Charges:    $Therapeutic Exercise: 8-22 mins $Therapeutic Activity: 8-22 mins PT General Charges $$ ACUTE PT VISIT: 1 Visit                     Vickki Muff, PT, DPT   Acute Rehabilitation Department Office [phone] Secure Chat Communication Preferred   Ronnie Derby 11/23/2023, 1:06 PM

## 2023-11-23 NOTE — TOC Progression Note (Signed)
Transition of Care [facility]) - Progression Note    Patient Details  Name: RABAB MERRITTS MRN: [id number] Date of Birth: 11-09-1933  [facility]) CM/SW Contact  Mearl Latin, LCSW Phone Number: 11/23/2023, 1:52 PM  Clinical Narrative:    CSW met with patient and spouse at bedside and provided SNF bed offers and Medicare ratings. They request [facility] after mapping the distances to their home. [facility] able to accept patient tomorrow if stable. Spouse requesting [facility] for transport.    Expected Discharge Plan: Skilled Nursing Facility Barriers to Discharge: Continued Medical Work up  Expected Discharge Plan and Services In-house Referral: Clinical Social Work     Living arrangements for the past 2 months: Single Family Home                                       Social Determinants of Health (SDOH) Interventions SDOH Screenings   Food Insecurity: No Food Insecurity (11/19/2023)  Housing: Low Risk  (11/19/2023)  Transportation Needs: No Transportation Needs (11/19/2023)  Utilities: Not At Risk (11/19/2023)  Tobacco Use: Low Risk  (01/03/2021)    Readmission Risk Interventions     No data to display

## 2023-11-23 NOTE — Discharge Instructions (Signed)

## 2023-11-23 NOTE — Care Management Important Message (Signed)
Important Message  Patient Details  Name: Stacy Valencia MRN: [id number] Date of Birth: 05/08/1933   Important Message Given:  Yes - Medicare IM     Dorena Bodo 11/23/2023, 3:45 PM

## 2023-11-23 NOTE — Plan of Care (Signed)
  Problem: Education: Goal: Knowledge of General Education information will improve Description: Including pain rating scale, medication(s)/side effects and non-pharmacologic comfort measures Outcome: Progressing   Problem: Coping: Goal: Level of anxiety will decrease Outcome: Progressing   Problem: Elimination: Goal: Will not experience complications related to bowel motility Outcome: Progressing   

## 2023-11-24 DIAGNOSIS — M6282 Rhabdomyolysis: Secondary | ICD-10-CM | POA: Diagnosis not present

## 2023-11-24 DIAGNOSIS — K219 Gastro-esophageal reflux disease without esophagitis: Secondary | ICD-10-CM | POA: Diagnosis not present

## 2023-11-24 DIAGNOSIS — N179 Acute kidney failure, unspecified: Secondary | ICD-10-CM | POA: Diagnosis not present

## 2023-11-24 DIAGNOSIS — Z6838 Body mass index (BMI) 38.0-38.9, adult: Secondary | ICD-10-CM | POA: Diagnosis not present

## 2023-11-24 DIAGNOSIS — R000 Tachycardia, unspecified: Secondary | ICD-10-CM | POA: Diagnosis not present

## 2023-11-24 DIAGNOSIS — N390 Urinary tract infection, site not specified: Secondary | ICD-10-CM | POA: Diagnosis not present

## 2023-11-24 DIAGNOSIS — R262 Difficulty in walking, not elsewhere classified: Secondary | ICD-10-CM | POA: Diagnosis not present

## 2023-11-24 DIAGNOSIS — R531 Weakness: Secondary | ICD-10-CM | POA: Diagnosis not present

## 2023-11-24 DIAGNOSIS — L22 Diaper dermatitis: Secondary | ICD-10-CM | POA: Diagnosis not present

## 2023-11-24 DIAGNOSIS — R5381 Other malaise: Secondary | ICD-10-CM | POA: Diagnosis not present

## 2023-11-24 DIAGNOSIS — Z87898 Personal history of other specified conditions: Secondary | ICD-10-CM | POA: Diagnosis not present

## 2023-11-24 DIAGNOSIS — Z9981 Dependence on supplemental oxygen: Secondary | ICD-10-CM | POA: Diagnosis not present

## 2023-11-24 DIAGNOSIS — W19XXXD Unspecified fall, subsequent encounter: Secondary | ICD-10-CM | POA: Diagnosis not present

## 2023-11-24 DIAGNOSIS — R7989 Other specified abnormal findings of blood chemistry: Secondary | ICD-10-CM | POA: Diagnosis not present

## 2023-11-24 DIAGNOSIS — I4891 Unspecified atrial fibrillation: Secondary | ICD-10-CM | POA: Diagnosis not present

## 2023-11-24 DIAGNOSIS — R6510 Systemic inflammatory response syndrome (SIRS) of non-infectious origin without acute organ dysfunction: Secondary | ICD-10-CM | POA: Diagnosis not present

## 2023-11-24 DIAGNOSIS — E46 Unspecified protein-calorie malnutrition: Secondary | ICD-10-CM | POA: Diagnosis not present

## 2023-11-24 DIAGNOSIS — R278 Other lack of coordination: Secondary | ICD-10-CM | POA: Diagnosis not present

## 2023-11-24 DIAGNOSIS — I959 Hypotension, unspecified: Secondary | ICD-10-CM | POA: Diagnosis not present

## 2023-11-24 DIAGNOSIS — J9601 Acute respiratory failure with hypoxia: Secondary | ICD-10-CM | POA: Diagnosis not present

## 2023-11-24 DIAGNOSIS — F4322 Adjustment disorder with anxiety: Secondary | ICD-10-CM | POA: Diagnosis not present

## 2023-11-24 DIAGNOSIS — E039 Hypothyroidism, unspecified: Secondary | ICD-10-CM | POA: Diagnosis not present

## 2023-11-24 DIAGNOSIS — R41841 Cognitive communication deficit: Secondary | ICD-10-CM | POA: Diagnosis not present

## 2023-11-24 DIAGNOSIS — S7292XD Unspecified fracture of left femur, subsequent encounter for closed fracture with routine healing: Secondary | ICD-10-CM | POA: Diagnosis not present

## 2023-11-24 DIAGNOSIS — G8918 Other acute postprocedural pain: Secondary | ICD-10-CM | POA: Diagnosis not present

## 2023-11-24 DIAGNOSIS — Z7401 Bed confinement status: Secondary | ICD-10-CM | POA: Diagnosis not present

## 2023-11-24 DIAGNOSIS — D62 Acute posthemorrhagic anemia: Secondary | ICD-10-CM | POA: Diagnosis not present

## 2023-11-24 DIAGNOSIS — S72009D Fracture of unspecified part of neck of unspecified femur, subsequent encounter for closed fracture with routine healing: Secondary | ICD-10-CM | POA: Diagnosis not present

## 2023-11-24 DIAGNOSIS — E871 Hypo-osmolality and hyponatremia: Secondary | ICD-10-CM | POA: Diagnosis not present

## 2023-11-24 DIAGNOSIS — I10 Essential (primary) hypertension: Secondary | ICD-10-CM | POA: Diagnosis not present

## 2023-11-24 LAB — CULTURE, BLOOD (SINGLE): Culture: NO GROWTH

## 2023-11-24 MED ORDER — MIDODRINE HCL 5 MG PO TABS: 5.0000 mg | ORAL_TABLET | Freq: Three times a day (TID) | ORAL | Status: DC

## 2023-11-24 MED ORDER — PANTOPRAZOLE SODIUM 40 MG PO TBEC: 40.0000 mg | DELAYED_RELEASE_TABLET | Freq: Every day | ORAL | Status: DC

## 2023-11-24 MED ORDER — ACETAMINOPHEN 325 MG PO TABS: 650.0000 mg | ORAL_TABLET | Freq: Four times a day (QID) | ORAL | Status: DC | PRN

## 2023-11-24 MED ORDER — ALBUTEROL SULFATE (2.5 MG/3ML) 0.083% IN NEBU: 2.5000 mg | INHALATION_SOLUTION | Freq: Four times a day (QID) | RESPIRATORY_TRACT | Status: DC | PRN

## 2023-11-24 NOTE — Discharge Summary (Signed)
Physician Discharge Summary  Stacy Valencia EXB:[id number] DOB: Nov 03, 1933 DOA: 11/19/2023  PCP: Laurell Josephs, MD (Inactive)  Admit date: 11/19/2023 Discharge date: 11/24/2023  Admitted From: (Home) Disposition:  (SNF)  Recommendations for Outpatient Follow-up:  Please obtain BMP/CBC in one week Please check free T4 and TSH.  Diet recommendation:  Regular  Brief/Interim Summary: Patient is a 89 y.o.  female with history of PAF, VTE, chronic anticoagulation, chronic debility-walks with the help of a walker-presented after mechanical fall-she was found to have A-fib RVR and subsequently admitted to the hospitalist service.   Significant events: 11/28>> mechanical fall-A-fib RVR-did not tolerate Cardizem due to soft BP-started on amiodarone infusion-cardiology consulted-admit to [facility]. 11/29>> back in sinus rhythm.   Significant studies: 11/28>> CT head: No acute intracranial abnormality 11/28>> CT abdomen/pelvis: No acute findings. 11/28>> CXR: No pneumonia   Significant microbiology data: 11/28>> blood culture: No growth   Procedures: None   Consults: Cardiology   PAF with RVR -Initially on amiodarone drip, but she converted to sinus rhythm  -I have tried to increase her metoprolol dosing, but she could not tolerate increasing her metoprolol as blood pressure became soft , so her metoprolol dose has been decreased to her home dose at 12.5 mg p.o. twice daily, but overall blood pressure remains soft, so she has been started on midodrine, dose has been uptitrated to 5 mg oral 3 times daily which she had tolerated very well, she is currently with a good blood pressure. -Continue Xarelto The echo with a preserved EF Cardiology input greatly appreciated   Mechanical fall Uses a walker at baseline-fell while transitioning from walker to wheelchair-stayed on the floor for several hours because she could not get up. No syncope Imaging negative for any major fractures. PT/OT  eval -Commendation for SNF.   Rhabdomyolysis Secondary to fall and lying on the floor for several hours when she fell. Mild Hydrated overnight CK trending down,   AKI Mild Resolved Follow electrolytes periodically   Hyponatremia Resolved   SIRS Infection-sepsis ruled out Leukocytosis however no source of infection CXR negative for pneumonia (no cough/URI symptoms-although procalcitonin elevated) UA negative for UTI Blood cultures negative On exam-no focus of infection apparent Antibiotics has been discontinued   History of DVT/pulmonary embolism Xarelto   Borderline hypotension She was started on low-dose pain, blood pressure has been stable. Supportive care for now Cortisol reassuring at 15   Probable sick euthyroid syndrome Repeat thyroid function test in 4 to 6 weeks   Chronic debility/deconditioning PT/OT eval, recommendation for SNF placement    Discharge Diagnoses:  Principal Problem:   Atrial fibrillation with RVR (HCC) Active Problems:   Fall   Rhabdomyolysis   Atrial fibrillation (HCC)   SIRS (systemic inflammatory response syndrome) (HCC)   Elevated troponin   Acute respiratory failure with hypoxia (HCC)   Transient hypotension   AKI (acute kidney injury) (HCC)   Hyponatremia   Abnormal TSH    Discharge Instructions  Discharge Instructions     Diet - low sodium heart healthy   Complete by: As directed    Discharge instructions   Complete by: As directed    Get CBC, CMP, in 3 days  Activity: As tolerated with Full fall precautions use walker/cane & assistance as needed   Disposition SNF   Diet: Heart Healthy ** , with feeding assistance and aspiration precautions.   On your next visit with your primary care physician please Get Medicines reviewed and adjusted.   Please request your Prim.MD  to go over all Hospital Tests and Procedure/Radiological results at the follow up, please get all Hospital records sent to your Prim MD by  signing hospital release before you go home.   If you experience worsening of your admission symptoms, develop shortness of breath, life threatening emergency, suicidal or homicidal thoughts you must seek medical attention immediately by calling 911 or calling your MD immediately  if symptoms less severe.  You Must read complete instructions/literature along with all the possible adverse reactions/side effects for all the Medicines you take and that have been prescribed to you. Take any new Medicines after you have completely understood and accpet all the possible adverse reactions/side effects.   Do not drive, operating heavy machinery, perform activities at heights, swimming or participation in water activities or provide baby sitting services if your were admitted for syncope or siezures until you have seen by Primary MD or a Neurologist and advised to do so again.  Do not drive when taking Pain medications.    Do not take more than prescribed Pain, Sleep and Anxiety Medications  Special Instructions: If you have smoked or chewed Tobacco  in the last 2 yrs please stop smoking, stop any regular Alcohol  and or any Recreational drug use.  Wear Seat belts while driving.   Please note  You were cared for by a hospitalist during your hospital stay. If you have any questions about your discharge medications or the care you received while you were in the hospital after you are discharged, you can call the unit and asked to speak with the hospitalist on call if the hospitalist that took care of you is not available. Once you are discharged, your primary care physician will handle any further medical issues. Please note that NO REFILLS for any discharge medications will be authorized once you are discharged, as it is imperative that you return to your primary care physician (or establish a relationship with a primary care physician if you do not have one) for your aftercare needs so that they can  reassess your need for medications and monitor your lab values.   Increase activity slowly   Complete by: As directed       Allergies as of 11/24/2023       Reactions   Sulfa Antibiotics Nausea And Vomiting        Medication List     TAKE these medications    acetaminophen 325 MG tablet Commonly known as: TYLENOL Take 2 tablets (650 mg total) by mouth every 6 (six) hours as needed for mild pain (pain score 1-3) (or Fever >/= 101). What changed:  how much to take when to take this reasons to take this   albuterol (2.5 MG/3ML) 0.083% nebulizer solution Commonly known as: PROVENTIL Take 3 mLs (2.5 mg total) by nebulization every 6 (six) hours as needed for wheezing or shortness of breath.   metoprolol tartrate 25 MG tablet Commonly known as: LOPRESSOR Take 0.5 tablets (12.5 mg total) by mouth 2 (two) times daily.   midodrine 5 MG tablet Commonly known as: PROAMATINE Take 1 tablet (5 mg total) by mouth 3 (three) times daily with meals.   pantoprazole 40 MG tablet Commonly known as: PROTONIX Take 1 tablet (40 mg total) by mouth daily at 12 noon.   Xarelto 20 MG Tabs tablet Generic drug: rivaroxaban TAKE 1 TABLET BY MOUTH EVERY DAY        Contact information for follow-up providers     [facility], [facility]  Health Follow up.   Specialty: Home Health Services Why: Danelle Earthly will contact you within 48 hours to scheduel a home visit to begin you physical and occupational therapy Contact information: [address] [place] [address] [phone]         Sharlene Dory, PA-C Follow up on 12/25/2023.   Specialty: Cardiology Why: 8:50AM. Cardiology follow up Contact information: [address] [place] [address] [phone]              Contact information for after-discharge care     Destination     [facility] Preferred SNF .   Service: Skilled Nursing Contact information: [address] [place] [address] [phone]                    Allergies  Allergen Reactions   Sulfa Antibiotics Nausea And Vomiting    Consultations: Cardiology   Procedures/Studies: ECHOCARDIOGRAM COMPLETE  Result Date: 11/20/2023    ECHOCARDIOGRAM REPORT   Patient Name:   KAE WHARFF Gault Date of Exam: 11/20/2023 Medical Rec #:  [id number]      Height:       65.5 in Accession #:    [id number]     Weight:       225.1 lb Date of Birth:  01-May-1933      BSA:          2.092 m Patient Age:    89 years       BP:           131/74 mmHg Patient Gender: F              HR:           94 bpm. Exam Location:  Inpatient Procedure: 2D Echo, Cardiac Doppler, Color Doppler and Intracardiac            Opacification Agent Indications:    Elevated troponin  History:        Patient has no prior history of Echocardiogram examinations.                 Arrythmias:Atrial Fibrillation and Tachycardia;                 Signs/Symptoms:Hypotension.  Sonographer:    Vern Claude Referring Phys: [phone] TRACI R TURNER  Sonographer Comments: Image acquisition challenging due to respiratory motion. IMPRESSIONS  1. Left ventricular ejection fraction, by estimation, is 60 to 65%. The left ventricle has normal function. The left ventricle has no regional wall motion abnormalities. Left ventricular diastolic parameters are consistent with Grade I diastolic dysfunction (impaired relaxation).  2. Right ventricular systolic function is normal. The right ventricular size is normal. There is normal pulmonary artery systolic pressure.  3. The mitral valve is grossly normal. Trivial mitral valve regurgitation.  4. The aortic valve was not well visualized. There is moderate calcification of the aortic valve. Aortic valve regurgitation is mild to moderate.  5. The inferior vena cava is normal in size with greater than 50% respiratory variability, suggesting right atrial pressure of 3 mmHg. Comparison(s): No prior Echocardiogram.  FINDINGS  Left Ventricle: Left ventricular ejection fraction, by estimation, is 60 to 65%. The left ventricle has normal function. The left ventricle has no regional wall motion abnormalities. The left ventricular internal cavity size was normal in size. There is  no left ventricular hypertrophy. Left ventricular diastolic parameters are consistent with Grade I diastolic dysfunction (impaired relaxation). Right Ventricle: The  right ventricular size is normal. Right ventricular systolic function is normal. There is normal pulmonary artery systolic pressure. The tricuspid regurgitant velocity is 1.94 m/s, and with an assumed right atrial pressure of 3 mmHg,  the estimated right ventricular systolic pressure is 18.1 mmHg. Left Atrium: Left atrial size was normal in size. Right Atrium: Right atrial size was normal in size. Pericardium: There is no evidence of pericardial effusion. Mitral Valve: The mitral valve is grossly normal. Trivial mitral valve regurgitation. MV peak gradient, 2.0 mmHg. The mean mitral valve gradient is 1.0 mmHg. Tricuspid Valve: Tricuspid valve regurgitation is trivial. Aortic Valve: The aortic valve was not well visualized. There is moderate calcification of the aortic valve. Aortic valve regurgitation is mild to moderate. Aortic regurgitation PHT measures 249 msec. Aortic valve mean gradient measures 2.0 mmHg. Aortic valve peak gradient measures 2.7 mmHg. Aortic valve area, by VTI measures 3.17 cm. Pulmonic Valve: Pulmonic valve regurgitation is not visualized. Aorta: The aortic root and ascending aorta are structurally normal, with no evidence of dilitation. Venous: The inferior vena cava is normal in size with greater than 50% respiratory variability, suggesting right atrial pressure of 3 mmHg. IAS/Shunts: The interatrial septum was not well visualized.  LEFT VENTRICLE PLAX 2D LVIDd:         4.80 cm     Diastology LVIDs:         3.10 cm     LV e' lateral:   9.03 cm/s LV PW:         0.90 cm      LV E/e' lateral: 5.0 LV IVS:        1.00 cm LVOT diam:     2.10 cm LV SV:         45 LV SV Index:   22 LVOT Area:     3.46 cm  LV Volumes (MOD) LV vol d, MOD A2C: 54.9 ml LV vol d, MOD A4C: 68.4 ml LV vol s, MOD A2C: 16.7 ml LV vol s, MOD A4C: 21.0 ml LV SV MOD A2C:     38.2 ml LV SV MOD A4C:     68.4 ml LV SV MOD BP:      42.5 ml RIGHT VENTRICLE RV Basal diam:  3.20 cm RV Mid diam:    1.90 cm RV S prime:     10.80 cm/s LEFT ATRIUM           Index       RIGHT ATRIUM          Index LA Vol (A2C): 16.0 ml 7.65 ml/m  RA Area:     6.89 cm LA Vol (A4C): 20.0 ml 9.56 ml/m  RA Volume:   11.30 ml 5.40 ml/m  AORTIC VALVE                    PULMONIC VALVE AV Area (Vmax):    3.19 cm     PV Vmax:       0.76 m/s AV Area (Vmean):   2.67 cm     PV Peak grad:  2.3 mmHg AV Area (VTI):     3.17 cm AV Vmax:           81.50 cm/s AV Vmean:          60.900 cm/s AV VTI:            0.142 m AV Peak Grad:      2.7 mmHg AV Mean Grad:      2.0  mmHg LVOT Vmax:         75.00 cm/s LVOT Vmean:        47.000 cm/s LVOT VTI:          0.130 m LVOT/AV VTI ratio: 0.92 AI PHT:            249 msec  AORTA Ao Root diam: 3.70 cm Ao Asc diam:  3.40 cm MITRAL VALVE               TRICUSPID VALVE MV Area (PHT): 3.11 cm    TR Peak grad:   15.1 mmHg MV Area VTI:   2.81 cm    TR Vmax:        194.00 cm/s MV Peak grad:  2.0 mmHg MV Mean grad:  1.0 mmHg    SHUNTS MV Vmax:       0.70 m/s    Systemic VTI:  0.13 m MV Vmean:      56.1 cm/s   Systemic Diam: 2.10 cm MV Decel Time: 244 msec MV E velocity: 44.80 cm/s MV A velocity: 79.10 cm/s MV E/A ratio:  0.57 Photographer signed by Carolan Clines Signature Date/Time: 11/20/2023/2:03:18 PM    Final    CT ABDOMEN PELVIS W CONTRAST  Result Date: 11/19/2023 CLINICAL DATA:  Patient slid from her bed to the floor last night. Patient now complaining lower back pain. Clinical concern for bowel obstruction. EXAM: CT ABDOMEN AND PELVIS WITH CONTRAST TECHNIQUE: Multidetector CT imaging of the abdomen and  pelvis was performed using the standard protocol following bolus administration of intravenous contrast. RADIATION DOSE REDUCTION: This exam was performed according to the departmental dose-optimization program which includes automated exposure control, adjustment of the mA and/or kV according to patient size and/or use of iterative reconstruction technique. CONTRAST:  75mL OMNIPAQUE IOHEXOL 350 MG/ML SOLN COMPARISON:  Lumbar spine radiographs, 08/12/2020. Lumbar spine MRI, 10/08/2020. FINDINGS: Lower chest: Linear and reticular opacities noted at the lung bases, most prominently on the right, consistent with chronic scarring and associated with mild bronchiectasis. No acute findings. Hepatobiliary: Liver normal in size and overall attenuation. No mass or focal lesion. Gallbladder with mild increased attenuation along its dependent wall suspected to be sludge. No stones. No wall thickening or adjacent inflammation. No bile duct dilation. Pancreas: Mostly fatty replaced.  No mass or inflammation. Spleen: Normal in size without focal abnormality. Adrenals/Urinary Tract: Normal adrenal glands. Kidneys normal in overall size, orientation and position with symmetric enhancement excretion. Mild bilateral renal cortical thinning. 1.7 cm cyst, midpole of the left kidney. 1.1 cm cyst, upper pole of the right kidney. These are consistent with simple cyst with no follow-up recommended. No stones. No hydronephrosis. Normal ureters. Normal bladder. Stomach/Bowel: Small hiatal hernia. Stomach otherwise unremarkable. Small bowel and colon are normal in caliber. No wall thickening. No inflammation. No evidence of obstruction. Left colon diverticula without diverticulitis. Vascular/Lymphatic: Aortic atherosclerosis. No aneurysm. No enlarged lymph nodes. Reproductive: Status post hysterectomy. No adnexal masses. Other: No abdominal wall hernia or abnormality. No abdominopelvic ascites. Musculoskeletal: Mild compression fracture of  T12, severe compression fracture of L2 and moderate compression fracture of L3. Fractures are stable in appearance when compared to the MRI dated 10/08/2020. Previous ORIF of a left proximal femur fracture. No evidence of an acute fracture. No bone lesion. IMPRESSION: 1. No acute findings within the abdomen or pelvis. No evidence of bowel obstruction. 2. Chronic compression fractures of T12, L2 and L3. No evidence of an acute fracture. 3. Aortic atherosclerosis. Aortic  Atherosclerosis (ICD10-I70.0). Electronically Signed   By: Amie Portland M.D.   On: 11/19/2023 07:45   CT HEAD WO CONTRAST ( )  Result Date: 11/19/2023 CLINICAL DATA:  Head trauma, minor.  Slide from bed last night. EXAM: CT HEAD WITHOUT CONTRAST TECHNIQUE: Contiguous axial images were obtained from the base of the skull through the vertex without intravenous contrast. RADIATION DOSE REDUCTION: This exam was performed according to the departmental dose-optimization program which includes automated exposure control, adjustment of the mA and/or kV according to patient size and/or use of iterative reconstruction technique. COMPARISON:  None Available. FINDINGS: Brain: No evidence of acute infarction, hemorrhage, hydrocephalus, extra-axial collection or mass lesion/mass effect. Frontal predominant brain atrophy chronic small vessel ischemia in the periventricular white matter. Vascular: No hyperdense vessel or unexpected calcification. Skull: Normal. Negative for fracture or focal lesion. Sinuses/Orbits: No acute finding. IMPRESSION: No evidence of intracranial injury or fracture. Electronically Signed   By: Tiburcio Pea M.D.   On: 11/19/2023 07:39   DG Chest Port 1 View  Result Date: 11/19/2023 CLINICAL DATA:  89 year old female with nausea, fall. EXAM: PORTABLE CHEST 1 VIEW COMPARISON:  Portable chest 07/22/2020 and earlier. FINDINGS: Portable AP semi upright view at 0527 hours. Improved lung volumes. Chronic tortuosity of the thoracic  aorta appears stable. Stable cardiac size, within normal limits. Visualized tracheal air column is within normal limits. Allowing for portable technique the lungs are clear. No pneumothorax or pleural effusion. No acute osseous abnormality identified. Paucity of bowel gas the visible abdomen. IMPRESSION: No acute cardiopulmonary abnormality. Electronically Signed   By: Odessa Fleming M.D.   On: 11/19/2023 05:47      Subjective:  No significant events overnight, she denies any complaints today Discharge Exam: Vitals:   11/24/23 0100 11/24/23 0845  BP: (!) 142/62 111/72  Pulse:  74  Resp: (!) 21 20  Temp: 98.5 F (36.9 C) (!) 97.4 F (36.3 C)  SpO2: 93% 92%   Vitals:   11/23/23 1940 11/24/23 0100 11/24/23 0500 11/24/23 0845  BP: 114/73 (!) 142/62  111/72  Pulse:    74  Resp: (!) 21 (!) 21  20  Temp: (!) 97.5 F (36.4 C) 98.5 F (36.9 C)  (!) 97.4 F (36.3 C)  TempSrc: Oral Oral  Oral  SpO2: 90% 93%  92%  Weight:   69.8 kg   Height:        General: Pt is alert, awake, not in acute distress, frail Cardiovascular: RRR, S1/S2 +, no rubs, no gallops Respiratory: CTA bilaterally, no wheezing, no rhonchi Abdominal: Soft, NT, ND, bowel sounds + Extremities: no edema, no cyanosis    The results of significant diagnostics from this hospitalization (including imaging, microbiology, ancillary and laboratory) are listed below for reference.     Microbiology: Recent Results (from the past 240 hour(s))  Culture, blood (single) w Reflex to ID Panel     Status: None   Collection Time: 11/19/23 10:00 AM   Specimen: BLOOD RIGHT ARM  Result Value Ref Range Status   Specimen Description BLOOD RIGHT ARM  Final   Special Requests   Final    BOTTLES DRAWN AEROBIC AND ANAEROBIC Blood Culture results may not be optimal due to an inadequate volume of blood received in culture bottles   Culture   Final    NO GROWTH 5 DAYS Performed at [facility], [address] [place], [place] [address]     Report Status 11/24/2023 FINAL  Final  MRSA Next Gen by  PCR, Nasal     Status: None   Collection Time: 11/19/23  5:13 PM   Specimen: Nasal Mucosa; Nasal Swab  Result Value Ref Range Status   MRSA by PCR Next Gen NOT DETECTED NOT DETECTED Final    Comment: (NOTE) The GeneXpert MRSA Assay (FDA approved for NASAL specimens only), is one component of a comprehensive MRSA colonization surveillance program. It is not intended to diagnose MRSA infection nor to guide or monitor treatment for MRSA infections. Test performance is not FDA approved in patients less than 74 years old. Performed at [facility], [address] [place], [place] [address]      Labs: BNP (last 3 results) Recent Labs    11/19/23 0553  BNP 437.2*   Basic Metabolic Panel: Recent Labs  Lab 11/19/23 0553 11/19/23 0802 11/19/23 1000 11/20/23 0442 11/21/23 0330  NA 131*  --   --  135 136  K 3.6  --   --  3.6 3.8  CL 99  --   --  109 108  CO2 18*  --   --  19* 23  GLUCOSE 113*  --   --  97 91  BUN 20  --   --  12 7*  CREATININE 1.30*  --   --  0.76 0.79  CALCIUM 9.1  --   --  7.6* 8.2*  MG  --  0.8* 1.5* 2.1  --    Liver Function Tests: Recent Labs  Lab 11/19/23 0553  AST 56*  ALT 18  ALKPHOS 50  BILITOT 1.8*  PROT 5.9*  ALBUMIN 2.8*   Recent Labs  Lab 11/19/23 0553  LIPASE 19   No results for input(s): "AMMONIA" in the last 168 hours. CBC: Recent Labs  Lab 11/19/23 0553 11/20/23 0442 11/21/23 0330  WBC 19.4* 10.5 8.1  HGB 15.0 12.1 12.3  HCT 43.9 35.9* 36.6  MCV 106.3* 105.3* 106.7*  PLT 184 151 167   Cardiac Enzymes: Recent Labs  Lab 11/19/23 0553 11/21/23 0330  CKTOTAL 1,741* 366*   BNP: Invalid input(s): "POCBNP" CBG: No results for input(s): "GLUCAP" in the last 168 hours. D-Dimer No results for input(s): "DDIMER" in the last 72 hours. Hgb A1c No results for input(s): "HGBA1C" in the last 72 hours. Lipid Profile No results for input(s): "CHOL", "HDL",  "LDLCALC", "TRIG", "CHOLHDL", "LDLDIRECT" in the last 72 hours. Thyroid function studies No results for input(s): "TSH", "T4TOTAL", "T3FREE", "THYROIDAB" in the last 72 hours.  Invalid input(s): "FREET3" Anemia work up No results for input(s): "VITAMINB12", "FOLATE", "FERRITIN", "TIBC", "IRON", "RETICCTPCT" in the last 72 hours. Urinalysis    Component Value Date/Time   COLORURINE YELLOW 11/19/2023 1850   APPEARANCEUR CLEAR 11/19/2023 1850   LABSPEC 1.028 11/19/2023 1850   PHURINE 5.0 11/19/2023 1850   GLUCOSEU NEGATIVE 11/19/2023 1850   HGBUR NEGATIVE 11/19/2023 1850   BILIRUBINUR NEGATIVE 11/19/2023 1850   KETONESUR NEGATIVE 11/19/2023 1850   PROTEINUR NEGATIVE 11/19/2023 1850   NITRITE NEGATIVE 11/19/2023 1850   LEUKOCYTESUR NEGATIVE 11/19/2023 1850   Sepsis Labs Recent Labs  Lab 11/19/23 0553 11/20/23 0442 11/21/23 0330  WBC 19.4* 10.5 8.1   Microbiology Recent Results (from the past 240 hour(s))  Culture, blood (single) w Reflex to ID Panel     Status: None   Collection Time: 11/19/23 10:00 AM   Specimen: BLOOD RIGHT ARM  Result Value Ref Range Status   Specimen Description BLOOD RIGHT ARM  Final   Special Requests   Final  BOTTLES DRAWN AEROBIC AND ANAEROBIC Blood Culture results may not be optimal due to an inadequate volume of blood received in culture bottles   Culture   Final    NO GROWTH 5 DAYS Performed at [facility], [address], [place], [place] [address]    Report Status 11/24/2023 FINAL  Final  MRSA Next Gen by PCR, Nasal     Status: None   Collection Time: 11/19/23  5:13 PM   Specimen: Nasal Mucosa; Nasal Swab  Result Value Ref Range Status   MRSA by PCR Next Gen NOT DETECTED NOT DETECTED Final    Comment: (NOTE) The GeneXpert MRSA Assay (FDA approved for NASAL specimens only), is one component of a comprehensive MRSA colonization surveillance program. It is not intended to diagnose MRSA infection nor to guide or monitor treatment for  MRSA infections. Test performance is not FDA approved in patients less than 62 years old. Performed at [facility], [address] [place], [place] [address]      Time coordinating discharge: Over 30 minutes  SIGNED:   Huey Bienenstock, MD  [facility] 11/24/2023, 9:52 AM Pager   If 7PM-7AM, please contact night-coverage [email or web address] Password [email or web address]

## 2023-11-24 NOTE — TOC Transition Note (Signed)
Transition of Care [facility]) - CM/SW Discharge Note   Patient Details  Name: Stacy Valencia MRN: [id number] Date of Birth: Jan 29, 1933  Transition of [facility]) CM/SW Contact:  Mearl Latin, LCSW Phone Number: 11/24/2023, 1:54 PM   Clinical Narrative:    Patient will DC to: [facility] Anticipated DC date: 11/24/23 Family notified: Spouse Transport by: Sharin Mons   Per MD patient ready for DC to [facility]. RN to call report prior to discharge ( [phone], dial [phone] for nurse, room 201). RN, patient, patient's family, and facility notified of DC. Discharge Summary and FL2 sent to facility. DC packet on chart. Ambulance transport requested for patient.   CSW will sign off for now as social work intervention is no longer needed. Please consult [place] again if new needs arise.     Final next level of care: Skilled Nursing Facility Barriers to Discharge: Barriers Resolved   Patient Goals and CMS Choice CMS Medicare.gov Compare Post Acute Care list provided to:: Patient Choice offered to / list presented to : Patient, Spouse  Discharge Placement     Existing PASRR number confirmed : 11/24/23          Patient chooses bed at: [facility] Patient to be transferred to facility by: [facility] Name of family member notified: spouse Patient and family notified of of transfer: 11/24/23  Discharge Plan and Services Additional resources added to the After Visit Summary for   In-house Referral: Clinical Social Work                                   Social Determinants of Health (SDOH) Interventions SDOH Screenings   Food Insecurity: No Food Insecurity (11/19/2023)  Housing: Low Risk  (11/19/2023)  Transportation Needs: No Transportation Needs (11/19/2023)  Utilities: Not At Risk (11/19/2023)  Tobacco Use: Low Risk  (01/03/2021)     Readmission Risk Interventions     No data to display

## 2023-11-24 NOTE — TOC Progression Note (Signed)
Transition of Care [facility]) - Progression Note    Patient Details  Name: Stacy Valencia MRN: [id number] Date of Birth: 1933-01-07  [facility] of Care [facility]) CM/SW Contact  Mearl Latin, LCSW Phone Number: 11/24/2023, 12:10 PM  Clinical Narrative:    CSW updated patient's spouse of discharge plan to [facility] today. [facility] is requesting an afternoon transport to get their bed ready.    Expected Discharge Plan: Skilled Nursing Facility Barriers to Discharge: Barriers Resolved  Expected Discharge Plan and Services In-house Referral: Clinical Social Work     Living arrangements for the past 2 months: Single Family Home Expected Discharge Date: 11/24/23                                     Social Determinants of Health (SDOH) Interventions SDOH Screenings   Food Insecurity: No Food Insecurity (11/19/2023)  Housing: Low Risk  (11/19/2023)  Transportation Needs: No Transportation Needs (11/19/2023)  Utilities: Not At Risk (11/19/2023)  Tobacco Use: Low Risk  (01/03/2021)    Readmission Risk Interventions     No data to display

## 2023-11-24 NOTE — Plan of Care (Signed)
  Problem: Education: Goal: Knowledge of General Education information will improve Description: Including pain rating scale, medication(s)/side effects and non-pharmacologic comfort measures Outcome: Adequate for Discharge   Problem: Health Behavior/Discharge Planning: Goal: Ability to manage health-related needs will improve Outcome: Adequate for Discharge   Problem: Clinical Measurements: Goal: Ability to maintain clinical measurements within normal limits will improve Outcome: Adequate for Discharge Goal: Will remain free from infection Outcome: Adequate for Discharge Goal: Diagnostic test results will improve Outcome: Adequate for Discharge Goal: Respiratory complications will improve Outcome: Adequate for Discharge Goal: Cardiovascular complication will be avoided Outcome: Adequate for Discharge   Problem: Activity: Goal: Risk for activity intolerance will decrease Outcome: Adequate for Discharge   Problem: Nutrition: Goal: Adequate nutrition will be maintained Outcome: Adequate for Discharge   Problem: Coping: Goal: Level of anxiety will decrease Outcome: Adequate for Discharge   Problem: Elimination: Goal: Will not experience complications related to bowel motility Outcome: Adequate for Discharge Goal: Will not experience complications related to urinary retention Outcome: Adequate for Discharge   Problem: Pain Management: Goal: General experience of comfort will improve Outcome: Adequate for Discharge   Problem: Safety: Goal: Ability to remain free from injury will improve Outcome: Adequate for Discharge   Problem: Skin Integrity: Goal: Risk for impaired skin integrity will decrease Outcome: Adequate for Discharge   Problem: Education: Goal: Knowledge of disease and its progression will improve Outcome: Adequate for Discharge   Problem: Health Behavior/Discharge Planning: Goal: Ability to manage health-related needs will improve Outcome: Adequate for  Discharge   Problem: Clinical Measurements: Goal: Complications related to the disease process or treatment will be avoided or minimized Outcome: Adequate for Discharge Goal: Dialysis access will remain free of complications Outcome: Adequate for Discharge   Problem: Activity: Goal: Activity intolerance will improve Outcome: Adequate for Discharge   Problem: Fluid Volume: Goal: Fluid volume balance will be maintained or improved Outcome: Adequate for Discharge   Problem: Nutritional: Goal: Ability to make appropriate dietary choices will improve Outcome: Adequate for Discharge   Problem: Respiratory: Goal: Respiratory symptoms related to disease process will be avoided Outcome: Adequate for Discharge   Problem: Self-Concept: Goal: Body image disturbance will be avoided or minimized Outcome: Adequate for Discharge   Problem: Urinary Elimination: Goal: Progression of disease will be identified and treated Outcome: Adequate for Discharge

## 2023-11-24 NOTE — Plan of Care (Signed)
  Problem: Education: Goal: Knowledge of General Education information will improve Description: Including pain rating scale, medication(s)/side effects and non-pharmacologic comfort measures Outcome: Progressing   Problem: Health Behavior/Discharge Planning: Goal: Ability to manage health-related needs will improve Outcome: Progressing   Problem: Clinical Measurements: Goal: Ability to maintain clinical measurements within normal limits will improve Outcome: Progressing Goal: Will remain free from infection Outcome: Progressing Goal: Diagnostic test results will improve Outcome: Progressing Goal: Respiratory complications will improve Outcome: Progressing Goal: Cardiovascular complication will be avoided Outcome: Progressing   Problem: Activity: Goal: Risk for activity intolerance will decrease Outcome: Progressing   Problem: Nutrition: Goal: Adequate nutrition will be maintained Outcome: Progressing   Problem: Coping: Goal: Level of anxiety will decrease Outcome: Progressing   Problem: Elimination: Goal: Will not experience complications related to bowel motility Outcome: Progressing Goal: Will not experience complications related to urinary retention Outcome: Progressing   Problem: Pain Management: Goal: General experience of comfort will improve Outcome: Progressing   Problem: Safety: Goal: Ability to remain free from injury will improve Outcome: Progressing   Problem: Skin Integrity: Goal: Risk for impaired skin integrity will decrease Outcome: Progressing   Problem: Education: Goal: Knowledge of disease and its progression will improve Outcome: Progressing   Problem: Health Behavior/Discharge Planning: Goal: Ability to manage health-related needs will improve Outcome: Progressing   Problem: Clinical Measurements: Goal: Complications related to the disease process or treatment will be avoided or minimized Outcome: Progressing Goal: Dialysis access  will remain free of complications Outcome: Progressing   Problem: Activity: Goal: Activity intolerance will improve Outcome: Progressing   Problem: Fluid Volume: Goal: Fluid volume balance will be maintained or improved Outcome: Progressing   Problem: Nutritional: Goal: Ability to make appropriate dietary choices will improve Outcome: Progressing   Problem: Respiratory: Goal: Respiratory symptoms related to disease process will be avoided Outcome: Progressing   Problem: Self-Concept: Goal: Body image disturbance will be avoided or minimized Outcome: Progressing   Problem: Urinary Elimination: Goal: Progression of disease will be identified and treated Outcome: Progressing

## 2023-11-25 DIAGNOSIS — R5381 Other malaise: Secondary | ICD-10-CM | POA: Diagnosis not present

## 2023-11-25 DIAGNOSIS — I4891 Unspecified atrial fibrillation: Secondary | ICD-10-CM | POA: Diagnosis not present

## 2023-11-25 DIAGNOSIS — I10 Essential (primary) hypertension: Secondary | ICD-10-CM | POA: Diagnosis not present

## 2023-11-25 DIAGNOSIS — K219 Gastro-esophageal reflux disease without esophagitis: Secondary | ICD-10-CM | POA: Diagnosis not present

## 2023-11-26 DIAGNOSIS — I10 Essential (primary) hypertension: Secondary | ICD-10-CM | POA: Diagnosis not present

## 2023-11-26 DIAGNOSIS — L22 Diaper dermatitis: Secondary | ICD-10-CM | POA: Diagnosis not present

## 2023-11-26 DIAGNOSIS — I4891 Unspecified atrial fibrillation: Secondary | ICD-10-CM | POA: Diagnosis not present

## 2023-11-26 DIAGNOSIS — E46 Unspecified protein-calorie malnutrition: Secondary | ICD-10-CM | POA: Diagnosis not present

## 2023-11-27 DIAGNOSIS — R5381 Other malaise: Secondary | ICD-10-CM | POA: Diagnosis not present

## 2023-11-27 DIAGNOSIS — K219 Gastro-esophageal reflux disease without esophagitis: Secondary | ICD-10-CM | POA: Diagnosis not present

## 2023-11-27 DIAGNOSIS — I4891 Unspecified atrial fibrillation: Secondary | ICD-10-CM | POA: Diagnosis not present

## 2023-11-27 DIAGNOSIS — I10 Essential (primary) hypertension: Secondary | ICD-10-CM | POA: Diagnosis not present

## 2023-11-30 DIAGNOSIS — R5381 Other malaise: Secondary | ICD-10-CM | POA: Diagnosis not present

## 2023-11-30 DIAGNOSIS — I10 Essential (primary) hypertension: Secondary | ICD-10-CM | POA: Diagnosis not present

## 2023-11-30 DIAGNOSIS — K219 Gastro-esophageal reflux disease without esophagitis: Secondary | ICD-10-CM | POA: Diagnosis not present

## 2023-11-30 DIAGNOSIS — I4891 Unspecified atrial fibrillation: Secondary | ICD-10-CM | POA: Diagnosis not present

## 2023-12-02 DIAGNOSIS — L22 Diaper dermatitis: Secondary | ICD-10-CM | POA: Diagnosis not present

## 2023-12-02 DIAGNOSIS — E46 Unspecified protein-calorie malnutrition: Secondary | ICD-10-CM | POA: Diagnosis not present

## 2023-12-02 DIAGNOSIS — I10 Essential (primary) hypertension: Secondary | ICD-10-CM | POA: Diagnosis not present

## 2023-12-02 DIAGNOSIS — I4891 Unspecified atrial fibrillation: Secondary | ICD-10-CM | POA: Diagnosis not present

## 2023-12-02 DIAGNOSIS — R5381 Other malaise: Secondary | ICD-10-CM | POA: Diagnosis not present

## 2023-12-02 DIAGNOSIS — K219 Gastro-esophageal reflux disease without esophagitis: Secondary | ICD-10-CM | POA: Diagnosis not present

## 2023-12-03 DIAGNOSIS — K219 Gastro-esophageal reflux disease without esophagitis: Secondary | ICD-10-CM | POA: Diagnosis not present

## 2023-12-03 DIAGNOSIS — I4891 Unspecified atrial fibrillation: Secondary | ICD-10-CM | POA: Diagnosis not present

## 2023-12-03 DIAGNOSIS — I10 Essential (primary) hypertension: Secondary | ICD-10-CM | POA: Diagnosis not present

## 2023-12-03 DIAGNOSIS — R5381 Other malaise: Secondary | ICD-10-CM | POA: Diagnosis not present

## 2023-12-04 DIAGNOSIS — R5381 Other malaise: Secondary | ICD-10-CM | POA: Diagnosis not present

## 2023-12-04 DIAGNOSIS — K219 Gastro-esophageal reflux disease without esophagitis: Secondary | ICD-10-CM | POA: Diagnosis not present

## 2023-12-04 DIAGNOSIS — I4891 Unspecified atrial fibrillation: Secondary | ICD-10-CM | POA: Diagnosis not present

## 2023-12-04 DIAGNOSIS — I10 Essential (primary) hypertension: Secondary | ICD-10-CM | POA: Diagnosis not present

## 2023-12-07 DIAGNOSIS — I10 Essential (primary) hypertension: Secondary | ICD-10-CM | POA: Diagnosis not present

## 2023-12-07 DIAGNOSIS — R5381 Other malaise: Secondary | ICD-10-CM | POA: Diagnosis not present

## 2023-12-07 DIAGNOSIS — I4891 Unspecified atrial fibrillation: Secondary | ICD-10-CM | POA: Diagnosis not present

## 2023-12-07 DIAGNOSIS — K219 Gastro-esophageal reflux disease without esophagitis: Secondary | ICD-10-CM | POA: Diagnosis not present

## 2023-12-08 DIAGNOSIS — I10 Essential (primary) hypertension: Secondary | ICD-10-CM | POA: Diagnosis not present

## 2023-12-08 DIAGNOSIS — L22 Diaper dermatitis: Secondary | ICD-10-CM | POA: Diagnosis not present

## 2023-12-08 DIAGNOSIS — K219 Gastro-esophageal reflux disease without esophagitis: Secondary | ICD-10-CM | POA: Diagnosis not present

## 2023-12-08 DIAGNOSIS — I4891 Unspecified atrial fibrillation: Secondary | ICD-10-CM | POA: Diagnosis not present

## 2023-12-08 DIAGNOSIS — R5381 Other malaise: Secondary | ICD-10-CM | POA: Diagnosis not present

## 2023-12-08 DIAGNOSIS — E46 Unspecified protein-calorie malnutrition: Secondary | ICD-10-CM | POA: Diagnosis not present

## 2023-12-09 DIAGNOSIS — I4891 Unspecified atrial fibrillation: Secondary | ICD-10-CM | POA: Diagnosis not present

## 2023-12-09 DIAGNOSIS — I10 Essential (primary) hypertension: Secondary | ICD-10-CM | POA: Diagnosis not present

## 2023-12-09 DIAGNOSIS — K219 Gastro-esophageal reflux disease without esophagitis: Secondary | ICD-10-CM | POA: Diagnosis not present

## 2023-12-09 DIAGNOSIS — R5381 Other malaise: Secondary | ICD-10-CM | POA: Diagnosis not present

## 2023-12-11 DIAGNOSIS — I4891 Unspecified atrial fibrillation: Secondary | ICD-10-CM | POA: Diagnosis not present

## 2023-12-11 DIAGNOSIS — K219 Gastro-esophageal reflux disease without esophagitis: Secondary | ICD-10-CM | POA: Diagnosis not present

## 2023-12-11 DIAGNOSIS — I10 Essential (primary) hypertension: Secondary | ICD-10-CM | POA: Diagnosis not present

## 2023-12-11 DIAGNOSIS — R5381 Other malaise: Secondary | ICD-10-CM | POA: Diagnosis not present

## 2023-12-12 DIAGNOSIS — K219 Gastro-esophageal reflux disease without esophagitis: Secondary | ICD-10-CM | POA: Diagnosis not present

## 2023-12-12 DIAGNOSIS — R5381 Other malaise: Secondary | ICD-10-CM | POA: Diagnosis not present

## 2023-12-12 DIAGNOSIS — I10 Essential (primary) hypertension: Secondary | ICD-10-CM | POA: Diagnosis not present

## 2023-12-12 DIAGNOSIS — I4891 Unspecified atrial fibrillation: Secondary | ICD-10-CM | POA: Diagnosis not present

## 2023-12-15 DIAGNOSIS — K219 Gastro-esophageal reflux disease without esophagitis: Secondary | ICD-10-CM | POA: Diagnosis not present

## 2023-12-15 DIAGNOSIS — L22 Diaper dermatitis: Secondary | ICD-10-CM | POA: Diagnosis not present

## 2023-12-15 DIAGNOSIS — R5381 Other malaise: Secondary | ICD-10-CM | POA: Diagnosis not present

## 2023-12-15 DIAGNOSIS — E46 Unspecified protein-calorie malnutrition: Secondary | ICD-10-CM | POA: Diagnosis not present

## 2023-12-15 DIAGNOSIS — I4891 Unspecified atrial fibrillation: Secondary | ICD-10-CM | POA: Diagnosis not present

## 2023-12-15 DIAGNOSIS — I10 Essential (primary) hypertension: Secondary | ICD-10-CM | POA: Diagnosis not present

## 2023-12-18 DIAGNOSIS — I4891 Unspecified atrial fibrillation: Secondary | ICD-10-CM | POA: Diagnosis not present

## 2023-12-18 DIAGNOSIS — I10 Essential (primary) hypertension: Secondary | ICD-10-CM | POA: Diagnosis not present

## 2023-12-18 DIAGNOSIS — K219 Gastro-esophageal reflux disease without esophagitis: Secondary | ICD-10-CM | POA: Diagnosis not present

## 2023-12-18 DIAGNOSIS — R5381 Other malaise: Secondary | ICD-10-CM | POA: Diagnosis not present

## 2023-12-19 DIAGNOSIS — I4891 Unspecified atrial fibrillation: Secondary | ICD-10-CM | POA: Diagnosis not present

## 2023-12-19 DIAGNOSIS — I10 Essential (primary) hypertension: Secondary | ICD-10-CM | POA: Diagnosis not present

## 2023-12-19 DIAGNOSIS — R5381 Other malaise: Secondary | ICD-10-CM | POA: Diagnosis not present

## 2023-12-19 DIAGNOSIS — K219 Gastro-esophageal reflux disease without esophagitis: Secondary | ICD-10-CM | POA: Diagnosis not present

## 2023-12-21 DIAGNOSIS — I10 Essential (primary) hypertension: Secondary | ICD-10-CM | POA: Diagnosis not present

## 2023-12-21 DIAGNOSIS — I4891 Unspecified atrial fibrillation: Secondary | ICD-10-CM | POA: Diagnosis not present

## 2023-12-21 DIAGNOSIS — K219 Gastro-esophageal reflux disease without esophagitis: Secondary | ICD-10-CM | POA: Diagnosis not present

## 2023-12-21 DIAGNOSIS — R5381 Other malaise: Secondary | ICD-10-CM | POA: Diagnosis not present

## 2023-12-22 DIAGNOSIS — I10 Essential (primary) hypertension: Secondary | ICD-10-CM | POA: Diagnosis not present

## 2023-12-22 DIAGNOSIS — L22 Diaper dermatitis: Secondary | ICD-10-CM | POA: Diagnosis not present

## 2023-12-22 DIAGNOSIS — E46 Unspecified protein-calorie malnutrition: Secondary | ICD-10-CM | POA: Diagnosis not present

## 2023-12-22 DIAGNOSIS — I4891 Unspecified atrial fibrillation: Secondary | ICD-10-CM | POA: Diagnosis not present

## 2023-12-24 NOTE — Progress Notes (Signed)
 Cardiology Office Note:  .   Date:  12/25/2023  ID:  Stacy Valencia, DOB 1933-04-24, MRN [id number] PCP: Kip Beverley SQUIBB, MD (Inactive)  [facility] Providers Cardiologist:  Wilbert Bihari, MD {  History of Present Illness: Stacy   HAIDEN Valencia is a 89 y.o. female with a past medical history of PAF, VTE, chronic anticoagulation, chronic debility with the help of walker who presented to the ER after a mechanical fall and was found to have atrial fibrillation with RVR in the hospital and was admitted.  Presents today for follow-up appointment.  Needs labs BMP/CBC, free T4 and TSH  Initially, the patient was on amiodarone  drip for atrial fibrillation but converted to normal sinus rhythm.  Tried increasing her metoprolol  dose but could not tolerate increase due to blood pressure drop.  Started on low-dose midodrine  and titrated up to 5 mg 3 times daily.  Tolerated well.  Recommended to continue Xarelto .  Echocardiogram with preserved LVEF.  Today, she presents with a history of atrial fibrillation, for a follow-up visit after a recent hospitalization. The patient reports feeling well overall, but notes increased fatigue, which she attributes to her age of 9 years. The patient denies any symptoms of heart racing or palpitations. The patient's blood pressure has been managed with metoprolol  and midodrine , and she has not experienced any recent falls. The patient has been on Xarelto  for anticoagulation for several years without any bleeding complications. The patient also has a long-standing history of GERD, managed with daily omeprazole , which she reports is effective when taken in the morning. The patient is currently in a rehabilitation facility and is looking forward to her upcoming discharge.  Reports no shortness of breath nor dyspnea on exertion. Reports no chest pain, pressure, or tightness. No edema, orthopnea, PND. Reports no palpitations.   Discussed the use of AI scribe software for  clinical note transcription with the patient, who gave verbal consent to proceed.  ROS: pertinent ROS in HPI  Studies Reviewed: .       Echo 11/20/23 IMPRESSIONS     1. Left ventricular ejection fraction, by estimation, is 60 to 65%. The  left ventricle has normal function. The left ventricle has no regional  wall motion abnormalities. Left ventricular diastolic parameters are  consistent with Grade I diastolic  dysfunction (impaired relaxation).   2. Right ventricular systolic function is normal. The right ventricular  size is normal. There is normal pulmonary artery systolic pressure.   3. The mitral valve is grossly normal. Trivial mitral valve  regurgitation.   4. The aortic valve was not well visualized. There is moderate  calcification of the aortic valve. Aortic valve regurgitation is mild to  moderate.   5. The inferior vena cava is normal in size with greater than 50%  respiratory variability, suggesting right atrial pressure of 3 mmHg.  Risk Assessment/Calculations:    CHA2DS2-VASc Score = 5   This indicates a 7.2% annual risk of stroke. The patient's score is based upon: CHF History: 0 HTN History: 0 Diabetes History: 0 Stroke History: 2 Vascular Disease History: 0 Age Score: 2 Gender Score: 1      Physical Exam:   VS:  BP 122/66 (BP Location: Right Arm, Patient Position: Sitting, Cuff Size: Normal)   Pulse (!) 115   Resp 16   Ht 5' 5 (1.651 m)   Wt 150 lb (68 kg)   SpO2 94%   BMI 24.96 kg/m    Wt Readings from Last  3 Encounters:  12/25/23 150 lb (68 kg)  11/24/23 153 lb 14.1 oz (69.8 kg)  08/11/20 225 lb (102.1 kg)    GEN: Well nourished, well developed in no acute distress NECK: No JVD; No carotid bruits CARDIAC: sinus tachycardia, no murmurs, rubs, gallops RESPIRATORY:  Clear to auscultation without rales, wheezing or rhonchi  ABDOMEN: Soft, non-tender, non-distended EXTREMITIES:  No edema; No deformity   ASSESSMENT AND PLAN: .   Atrial  Fibrillation/Sinus tachycardia  Stable on current regimen of Metoprolol  and Xarelto . No symptoms of palpitations. Reports fatigue, which may be related to elevated heart rate (sinus tachycardia on EKG). No recent falls. -Continue current medication regimen. -Plan for follow-up in a couple of months.  Hypotension Managed with Midodrine . Blood pressure stable at current visit. -Continue Midodrine  as prescribed.  Gastroesophageal Reflux Disease (GERD) Symptoms controlled with daily Omeprazole  (Prilosec ), but reports inconsistent timing of medication administration leading to morning symptoms. -Ensure Omeprazole  is administered first thing in the morning. -Continue Omeprazole  20mg  daily.  General Health Maintenance Recent labs showed normal kidney function, electrolytes, and cortisol level. Slightly elevated CK and slightly low calcium . Thyroid  panel not yet done. -Plan for primary care physician to order thyroid  panel at next visit in 3-4 weeks. -Continue monitoring labs as per primary care physician's plan.      Dispo: She can follow-up in a few months with Dr. Shlomo  Signed, Orren LOISE Fabry, PA-C

## 2023-12-25 ENCOUNTER — Ambulatory Visit: Payer: Medicare Other | Attending: Physician Assistant | Admitting: Physician Assistant

## 2023-12-25 ENCOUNTER — Encounter: Payer: Self-pay | Admitting: Physician Assistant

## 2023-12-25 VITALS — BP 122/66 | HR 115 | Resp 16 | Ht 65.0 in | Wt 150.0 lb

## 2023-12-25 DIAGNOSIS — N179 Acute kidney failure, unspecified: Secondary | ICD-10-CM | POA: Insufficient documentation

## 2023-12-25 DIAGNOSIS — I4891 Unspecified atrial fibrillation: Secondary | ICD-10-CM | POA: Insufficient documentation

## 2023-12-25 DIAGNOSIS — E871 Hypo-osmolality and hyponatremia: Secondary | ICD-10-CM | POA: Insufficient documentation

## 2023-12-25 DIAGNOSIS — I10 Essential (primary) hypertension: Secondary | ICD-10-CM | POA: Diagnosis not present

## 2023-12-25 DIAGNOSIS — K219 Gastro-esophageal reflux disease without esophagitis: Secondary | ICD-10-CM | POA: Diagnosis not present

## 2023-12-25 DIAGNOSIS — W19XXXD Unspecified fall, subsequent encounter: Secondary | ICD-10-CM | POA: Insufficient documentation

## 2023-12-25 DIAGNOSIS — R5381 Other malaise: Secondary | ICD-10-CM | POA: Diagnosis not present

## 2023-12-25 NOTE — Patient Instructions (Signed)
 Medication Instructions:  Your physician recommends that you continue on your current medications as directed. Please refer to the Current Medication list given to you today.  *If you need a refill on your cardiac medications before your next appointment, please call your pharmacy*   Lab Work: None ordered  If you have labs (blood work) drawn today and your tests are completely normal, you will receive your results only by: MyChart Message (if you have MyChart) OR A paper copy in the mail If you have any lab test that is abnormal or we need to change your treatment, we will call you to review the results.   Testing/Procedures: None ordered   Follow-Up: At [facility], you and your health needs are our priority.  As part of our continuing mission to provide you with exceptional heart care, we have created designated Provider Care Teams.  These Care Teams include your primary Cardiologist (physician) and Advanced Practice Providers (APPs -  Physician Assistants and Nurse Practitioners) who all work together to provide you with the care you need, when you need it.  We recommend signing up for the patient portal called MyChart.  Sign up information is provided on this After Visit Summary.  MyChart is used to connect with patients for Virtual Visits (Telemedicine).  Patients are able to view lab/test results, encounter notes, upcoming appointments, etc.  Non-urgent messages can be sent to your provider as well.   To learn more about what you can do with MyChart, go to [email or web address].    Your next appointment:   2 month(s)  Provider:   Wilbert Bihari, MD     Other Instructions

## 2023-12-26 DIAGNOSIS — R41841 Cognitive communication deficit: Secondary | ICD-10-CM | POA: Diagnosis not present

## 2023-12-26 DIAGNOSIS — W19XXXD Unspecified fall, subsequent encounter: Secondary | ICD-10-CM | POA: Diagnosis not present

## 2023-12-26 DIAGNOSIS — I4891 Unspecified atrial fibrillation: Secondary | ICD-10-CM | POA: Diagnosis not present

## 2023-12-26 DIAGNOSIS — Z6838 Body mass index (BMI) 38.0-38.9, adult: Secondary | ICD-10-CM | POA: Diagnosis not present

## 2023-12-26 DIAGNOSIS — E039 Hypothyroidism, unspecified: Secondary | ICD-10-CM | POA: Diagnosis not present

## 2023-12-26 DIAGNOSIS — M6282 Rhabdomyolysis: Secondary | ICD-10-CM | POA: Diagnosis not present

## 2023-12-26 DIAGNOSIS — E46 Unspecified protein-calorie malnutrition: Secondary | ICD-10-CM | POA: Diagnosis not present

## 2023-12-26 DIAGNOSIS — I10 Essential (primary) hypertension: Secondary | ICD-10-CM | POA: Diagnosis not present

## 2023-12-26 DIAGNOSIS — R278 Other lack of coordination: Secondary | ICD-10-CM | POA: Diagnosis not present

## 2023-12-26 DIAGNOSIS — R262 Difficulty in walking, not elsewhere classified: Secondary | ICD-10-CM | POA: Diagnosis not present

## 2023-12-26 DIAGNOSIS — Z9981 Dependence on supplemental oxygen: Secondary | ICD-10-CM | POA: Diagnosis not present

## 2023-12-28 DIAGNOSIS — I10 Essential (primary) hypertension: Secondary | ICD-10-CM | POA: Diagnosis not present

## 2023-12-28 DIAGNOSIS — R5381 Other malaise: Secondary | ICD-10-CM | POA: Diagnosis not present

## 2023-12-28 DIAGNOSIS — I4891 Unspecified atrial fibrillation: Secondary | ICD-10-CM | POA: Diagnosis not present

## 2023-12-28 DIAGNOSIS — K219 Gastro-esophageal reflux disease without esophagitis: Secondary | ICD-10-CM | POA: Diagnosis not present

## 2023-12-29 DIAGNOSIS — R5381 Other malaise: Secondary | ICD-10-CM | POA: Diagnosis not present

## 2023-12-29 DIAGNOSIS — I4891 Unspecified atrial fibrillation: Secondary | ICD-10-CM | POA: Diagnosis not present

## 2023-12-29 DIAGNOSIS — I10 Essential (primary) hypertension: Secondary | ICD-10-CM | POA: Diagnosis not present

## 2023-12-29 DIAGNOSIS — K219 Gastro-esophageal reflux disease without esophagitis: Secondary | ICD-10-CM | POA: Diagnosis not present

## 2023-12-30 DIAGNOSIS — Z7901 Long term (current) use of anticoagulants: Secondary | ICD-10-CM | POA: Diagnosis not present

## 2023-12-30 DIAGNOSIS — I480 Paroxysmal atrial fibrillation: Secondary | ICD-10-CM | POA: Diagnosis not present

## 2023-12-30 DIAGNOSIS — J9601 Acute respiratory failure with hypoxia: Secondary | ICD-10-CM | POA: Diagnosis not present

## 2023-12-30 DIAGNOSIS — I700 Atherosclerosis of aorta: Secondary | ICD-10-CM | POA: Diagnosis not present

## 2023-12-30 DIAGNOSIS — Z6822 Body mass index (BMI) 22.0-22.9, adult: Secondary | ICD-10-CM | POA: Diagnosis not present

## 2023-12-30 DIAGNOSIS — I119 Hypertensive heart disease without heart failure: Secondary | ICD-10-CM | POA: Diagnosis not present

## 2023-12-30 DIAGNOSIS — F4322 Adjustment disorder with anxiety: Secondary | ICD-10-CM | POA: Diagnosis not present

## 2023-12-30 DIAGNOSIS — E039 Hypothyroidism, unspecified: Secondary | ICD-10-CM | POA: Diagnosis not present

## 2023-12-30 DIAGNOSIS — I959 Hypotension, unspecified: Secondary | ICD-10-CM | POA: Diagnosis not present

## 2023-12-30 DIAGNOSIS — I083 Combined rheumatic disorders of mitral, aortic and tricuspid valves: Secondary | ICD-10-CM | POA: Diagnosis not present

## 2023-12-30 DIAGNOSIS — Z9181 History of falling: Secondary | ICD-10-CM | POA: Diagnosis not present

## 2024-01-01 ENCOUNTER — Ambulatory Visit: Payer: Medicare Other | Admitting: Student

## 2024-01-06 DIAGNOSIS — I700 Atherosclerosis of aorta: Secondary | ICD-10-CM | POA: Diagnosis not present

## 2024-01-06 DIAGNOSIS — I480 Paroxysmal atrial fibrillation: Secondary | ICD-10-CM | POA: Diagnosis not present

## 2024-01-06 DIAGNOSIS — I119 Hypertensive heart disease without heart failure: Secondary | ICD-10-CM | POA: Diagnosis not present

## 2024-01-06 DIAGNOSIS — J9601 Acute respiratory failure with hypoxia: Secondary | ICD-10-CM | POA: Diagnosis not present

## 2024-01-06 DIAGNOSIS — I959 Hypotension, unspecified: Secondary | ICD-10-CM | POA: Diagnosis not present

## 2024-01-08 DIAGNOSIS — I959 Hypotension, unspecified: Secondary | ICD-10-CM | POA: Diagnosis not present

## 2024-01-08 DIAGNOSIS — I480 Paroxysmal atrial fibrillation: Secondary | ICD-10-CM | POA: Diagnosis not present

## 2024-01-08 DIAGNOSIS — I119 Hypertensive heart disease without heart failure: Secondary | ICD-10-CM | POA: Diagnosis not present

## 2024-01-08 DIAGNOSIS — J9601 Acute respiratory failure with hypoxia: Secondary | ICD-10-CM | POA: Diagnosis not present

## 2024-01-08 DIAGNOSIS — I700 Atherosclerosis of aorta: Secondary | ICD-10-CM | POA: Diagnosis not present

## 2024-01-12 DIAGNOSIS — J9601 Acute respiratory failure with hypoxia: Secondary | ICD-10-CM | POA: Diagnosis not present

## 2024-01-12 DIAGNOSIS — I700 Atherosclerosis of aorta: Secondary | ICD-10-CM | POA: Diagnosis not present

## 2024-01-12 DIAGNOSIS — I119 Hypertensive heart disease without heart failure: Secondary | ICD-10-CM | POA: Diagnosis not present

## 2024-01-12 DIAGNOSIS — I959 Hypotension, unspecified: Secondary | ICD-10-CM | POA: Diagnosis not present

## 2024-01-12 DIAGNOSIS — I480 Paroxysmal atrial fibrillation: Secondary | ICD-10-CM | POA: Diagnosis not present

## 2024-01-13 DIAGNOSIS — M810 Age-related osteoporosis without current pathological fracture: Secondary | ICD-10-CM | POA: Diagnosis not present

## 2024-01-14 DIAGNOSIS — J9601 Acute respiratory failure with hypoxia: Secondary | ICD-10-CM | POA: Diagnosis not present

## 2024-01-14 DIAGNOSIS — I480 Paroxysmal atrial fibrillation: Secondary | ICD-10-CM | POA: Diagnosis not present

## 2024-01-14 DIAGNOSIS — I700 Atherosclerosis of aorta: Secondary | ICD-10-CM | POA: Diagnosis not present

## 2024-01-14 DIAGNOSIS — I119 Hypertensive heart disease without heart failure: Secondary | ICD-10-CM | POA: Diagnosis not present

## 2024-01-14 DIAGNOSIS — I959 Hypotension, unspecified: Secondary | ICD-10-CM | POA: Diagnosis not present

## 2024-01-21 DIAGNOSIS — I700 Atherosclerosis of aorta: Secondary | ICD-10-CM | POA: Diagnosis not present

## 2024-01-21 DIAGNOSIS — I959 Hypotension, unspecified: Secondary | ICD-10-CM | POA: Diagnosis not present

## 2024-01-21 DIAGNOSIS — I480 Paroxysmal atrial fibrillation: Secondary | ICD-10-CM | POA: Diagnosis not present

## 2024-01-21 DIAGNOSIS — J9601 Acute respiratory failure with hypoxia: Secondary | ICD-10-CM | POA: Diagnosis not present

## 2024-01-21 DIAGNOSIS — I119 Hypertensive heart disease without heart failure: Secondary | ICD-10-CM | POA: Diagnosis not present

## 2024-01-27 DIAGNOSIS — I119 Hypertensive heart disease without heart failure: Secondary | ICD-10-CM | POA: Diagnosis not present

## 2024-01-27 DIAGNOSIS — I480 Paroxysmal atrial fibrillation: Secondary | ICD-10-CM | POA: Diagnosis not present

## 2024-01-27 DIAGNOSIS — I700 Atherosclerosis of aorta: Secondary | ICD-10-CM | POA: Diagnosis not present

## 2024-01-27 DIAGNOSIS — J9601 Acute respiratory failure with hypoxia: Secondary | ICD-10-CM | POA: Diagnosis not present

## 2024-01-27 DIAGNOSIS — I959 Hypotension, unspecified: Secondary | ICD-10-CM | POA: Diagnosis not present

## 2024-03-08 ENCOUNTER — Ambulatory Visit: Payer: Medicare Other | Admitting: Cardiology

## 2024-03-21 ENCOUNTER — Encounter: Payer: Self-pay | Admitting: Cardiology

## 2024-03-21 ENCOUNTER — Ambulatory Visit: Payer: Medicare Other | Attending: Cardiology | Admitting: Cardiology

## 2024-03-21 VITALS — BP 122/70 | Resp 16 | Ht 65.0 in | Wt 150.0 lb

## 2024-03-21 DIAGNOSIS — I480 Paroxysmal atrial fibrillation: Secondary | ICD-10-CM | POA: Diagnosis not present

## 2024-03-21 DIAGNOSIS — E785 Hyperlipidemia, unspecified: Secondary | ICD-10-CM | POA: Diagnosis not present

## 2024-03-21 DIAGNOSIS — M810 Age-related osteoporosis without current pathological fracture: Secondary | ICD-10-CM | POA: Diagnosis not present

## 2024-03-21 DIAGNOSIS — I951 Orthostatic hypotension: Secondary | ICD-10-CM | POA: Diagnosis not present

## 2024-03-21 MED ORDER — MIDODRINE HCL 5 MG PO TABS: 5.0000 mg | ORAL_TABLET | Freq: Two times a day (BID) | ORAL | 3 refills | Status: DC

## 2024-03-21 NOTE — Patient Instructions (Signed)
 Medication Instructions:  Take Midodrine 5mg  at 8AM and 5mg  2PM *If you need a refill on your cardiac medications before your next appointment, please call your pharmacy*  Lab Work: NONE If you have labs (blood work) drawn today and your tests are completely normal, you will receive your results only by: MyChart Message (if you have MyChart) OR A paper copy in the mail If you have any lab test that is abnormal or we need to change your treatment, we will call you to review the results.  Testing/Procedures: NONE  Follow-Up: At [facility], you and your health needs are our priority.  As part of our continuing mission to provide you with exceptional heart care, our providers are all part of one team.  This team includes your primary Cardiologist (physician) and Advanced Practice Providers or APPs (Physician Assistants and Nurse Practitioners) who all work together to provide you with the care you need, when you need it.  Your next appointment:   1 year(s)  Provider:   Armanda Magic, MD     We recommend signing up for the patient portal called "MyChart".  Sign up information is provided on this After Visit Summary.  MyChart is used to connect with patients for Virtual Visits (Telemedicine).  Patients are able to view lab/test results, encounter notes, upcoming appointments, etc.  Non-urgent messages can be sent to your provider as well.   To learn more about what you can do with MyChart, go to [email or web address].   Other Instructions       1st Floor: - Lobby - Registration  - Pharmacy  - Lab - Cafe  2nd Floor: - PV Lab - Diagnostic Testing (echo, CT, nuclear med)  3rd Floor: - Vacant  4th Floor: - TCTS (cardiothoracic surgery) - AFib Clinic - Structural Heart Clinic - Vascular Surgery  - Vascular Ultrasound  5th Floor: - HeartCare Cardiology (general and EP) - Clinical Pharmacy for coumadin, hypertension, lipid, weight-loss medications, and med  management appointments    Valet parking services will be available as well.

## 2024-03-21 NOTE — Progress Notes (Signed)
 Cardiology Office Note:  .   Date:  03/21/2024  ID:  Stacy Valencia, DOB [birth year], MRN [id number] PCP: Stacy Josephs, MD (Inactive)  [facility] Providers Cardiologist:  Stacy Magic, MD {  History of Present Illness: Stacy Valencia Kitchen   Stacy Valencia is a 89 y.o. female with a past medical history of PAF, VTE, chronic anticoagulation, chronic debility with the help of walker who presented to the ER after a mechanical fall and was found to have atrial fibrillation with RVR in the hospital and was admitted.    She is here today for followup and is doing well.  She denies any chest pain or pressure, SOB, DOE, PND, orthopnea, LE edema, dizziness, palpitations or syncope. Shde is compliant with her meds and is tolerating meds with no SE.    ROS: pertinent ROS in HPI  Studies Reviewed: .       Echo 11/20/23 IMPRESSIONS     1. Left ventricular ejection fraction, by estimation, is 60 to 65%. The  left ventricle has normal function. The left ventricle has no regional  wall motion abnormalities. Left ventricular diastolic parameters are  consistent with Grade I diastolic  dysfunction (impaired relaxation).   2. Right ventricular systolic function is normal. The right ventricular  size is normal. There is normal pulmonary artery systolic pressure.   3. The mitral valve is grossly normal. Trivial mitral valve  regurgitation.   4. The aortic valve was not well visualized. There is moderate  calcification of the aortic valve. Aortic valve regurgitation is mild to  moderate.   5. The inferior vena cava is normal in size with greater than 50%  respiratory variability, suggesting right atrial pressure of 3 mmHg.  Risk Assessment/Calculations:    CHA2DS2-VASc Score = 5   This indicates a 7.2% annual risk of stroke. The patient's score is based upon: CHF History: 0 HTN History: 0 Diabetes History: 0 Stroke History: 2 Vascular Disease History: 0 Age Score: 2 Gender Score: 1      Physical  Exam:   VS:  BP 122/70 (BP Location: Left Arm, Patient Position: Sitting, Cuff Size: Normal)   Resp 16   Ht 5\' 5"  (1.651 m)   Wt 150 lb (68 kg)   SpO2 94%   BMI 24.96 kg/m    Wt Readings from Last 3 Encounters:  03/21/24 150 lb (68 kg)  12/25/23 150 lb (68 kg)  11/24/23 153 lb 14.1 oz (69.8 kg)    GEN: Well nourished, well developed in no acute distress HEENT: Normal NECK: No JVD; No carotid bruits LYMPHATICS: No lymphadenopathy CARDIAC:RRR, no murmurs, rubs, gallops RESPIRATORY:  Clear to auscultation without rales, wheezing or rhonchi  ABDOMEN: Soft, non-tender, non-distended MUSCULOSKELETAL:  No edema; No deformity  SKIN: Warm and dry NEUROLOGIC:  Alert and oriented x 3 PSYCHIATRIC:  Normal affect   ASSESSMENT AND PLAN: .   Atrial Fibrillation/Sinus tachycardia  -She is maintaining normal sinus rhythm on exam today denies any palpitations  -Denies any bleeding problems on DOAC  -Continue prescription drug management with Lopressor 12.5 mg twice daily and Xarelto 20 mg daily with as needed refills -I have personally reviewed and interpreted outside labs performed by patient's PCP which showed serum creatinine 0.79 potassium 3.8 on 11/21/2023 and hemoglobin 12.3  Orthostatic hypotension -This has been manage outpatient with midodrine -Denies any recent dizzy spells or syncope -Continue prescription Drug Management with Midodrine 5 Mg 3 Times Daily with Meals  Follow-up 1 year  Signed, Stacy Valencia  Stacy Knife, MD

## 2024-03-21 NOTE — Addendum Note (Signed)
 Addended by: Erick Alley on: 03/21/2024 11:34 AM   Modules accepted: Orders

## 2024-04-10 ENCOUNTER — Emergency Department (HOSPITAL_COMMUNITY)

## 2024-04-10 ENCOUNTER — Encounter (HOSPITAL_COMMUNITY): Payer: Self-pay | Admitting: Internal Medicine

## 2024-04-10 ENCOUNTER — Other Ambulatory Visit: Payer: Self-pay

## 2024-04-10 ENCOUNTER — Inpatient Hospital Stay (HOSPITAL_COMMUNITY)
Admission: EM | Admit: 2024-04-10 | Discharge: 2024-04-14 | DRG: 444 | Disposition: A | Discharge status: Home / Self Care | Attending: Internal Medicine | Admitting: Internal Medicine

## 2024-04-10 DIAGNOSIS — E860 Dehydration: Secondary | ICD-10-CM | POA: Diagnosis present

## 2024-04-10 DIAGNOSIS — K8021 Calculus of gallbladder without cholecystitis with obstruction: Principal | ICD-10-CM

## 2024-04-10 DIAGNOSIS — Z90710 Acquired absence of both cervix and uterus: Secondary | ICD-10-CM

## 2024-04-10 DIAGNOSIS — Z6372 Alcoholism and drug addiction in family: Secondary | ICD-10-CM

## 2024-04-10 DIAGNOSIS — K219 Gastro-esophageal reflux disease without esophagitis: Secondary | ICD-10-CM | POA: Diagnosis present

## 2024-04-10 DIAGNOSIS — K8050 Calculus of bile duct without cholangitis or cholecystitis without obstruction: Secondary | ICD-10-CM | POA: Diagnosis present

## 2024-04-10 DIAGNOSIS — R109 Unspecified abdominal pain: Secondary | ICD-10-CM | POA: Diagnosis not present

## 2024-04-10 DIAGNOSIS — K315 Obstruction of duodenum: Secondary | ICD-10-CM | POA: Diagnosis present

## 2024-04-10 DIAGNOSIS — D72829 Elevated white blood cell count, unspecified: Secondary | ICD-10-CM | POA: Diagnosis not present

## 2024-04-10 DIAGNOSIS — Z811 Family history of alcohol abuse and dependence: Secondary | ICD-10-CM | POA: Diagnosis not present

## 2024-04-10 DIAGNOSIS — I480 Paroxysmal atrial fibrillation: Secondary | ICD-10-CM | POA: Diagnosis present

## 2024-04-10 DIAGNOSIS — R17 Unspecified jaundice: Secondary | ICD-10-CM | POA: Diagnosis present

## 2024-04-10 DIAGNOSIS — R410 Disorientation, unspecified: Secondary | ICD-10-CM | POA: Diagnosis not present

## 2024-04-10 DIAGNOSIS — R918 Other nonspecific abnormal finding of lung field: Secondary | ICD-10-CM | POA: Diagnosis not present

## 2024-04-10 DIAGNOSIS — R000 Tachycardia, unspecified: Secondary | ICD-10-CM | POA: Diagnosis not present

## 2024-04-10 DIAGNOSIS — E876 Hypokalemia: Secondary | ICD-10-CM | POA: Diagnosis not present

## 2024-04-10 DIAGNOSIS — Z86718 Personal history of other venous thrombosis and embolism: Secondary | ICD-10-CM | POA: Diagnosis not present

## 2024-04-10 DIAGNOSIS — K8062 Calculus of gallbladder and bile duct with acute cholecystitis without obstruction: Secondary | ICD-10-CM | POA: Diagnosis present

## 2024-04-10 DIAGNOSIS — L89313 Pressure ulcer of right buttock, stage 3: Secondary | ICD-10-CM | POA: Diagnosis present

## 2024-04-10 DIAGNOSIS — K2100 Gastro-esophageal reflux disease with esophagitis, without bleeding: Secondary | ICD-10-CM | POA: Diagnosis not present

## 2024-04-10 DIAGNOSIS — Z79899 Other long term (current) drug therapy: Secondary | ICD-10-CM

## 2024-04-10 DIAGNOSIS — Z7401 Bed confinement status: Secondary | ICD-10-CM | POA: Diagnosis not present

## 2024-04-10 DIAGNOSIS — Z86711 Personal history of pulmonary embolism: Secondary | ICD-10-CM | POA: Diagnosis not present

## 2024-04-10 DIAGNOSIS — K760 Fatty (change of) liver, not elsewhere classified: Secondary | ICD-10-CM | POA: Diagnosis not present

## 2024-04-10 DIAGNOSIS — I4891 Unspecified atrial fibrillation: Secondary | ICD-10-CM | POA: Diagnosis not present

## 2024-04-10 DIAGNOSIS — I2694 Multiple subsegmental pulmonary emboli without acute cor pulmonale: Secondary | ICD-10-CM | POA: Diagnosis present

## 2024-04-10 DIAGNOSIS — R296 Repeated falls: Secondary | ICD-10-CM | POA: Diagnosis present

## 2024-04-10 DIAGNOSIS — Z8279 Family history of other congenital malformations, deformations and chromosomal abnormalities: Secondary | ICD-10-CM | POA: Diagnosis not present

## 2024-04-10 DIAGNOSIS — R0689 Other abnormalities of breathing: Secondary | ICD-10-CM | POA: Diagnosis not present

## 2024-04-10 DIAGNOSIS — R1110 Vomiting, unspecified: Secondary | ICD-10-CM | POA: Diagnosis not present

## 2024-04-10 DIAGNOSIS — K8063 Calculus of gallbladder and bile duct with acute cholecystitis with obstruction: Secondary | ICD-10-CM | POA: Diagnosis present

## 2024-04-10 DIAGNOSIS — I82409 Acute embolism and thrombosis of unspecified deep veins of unspecified lower extremity: Secondary | ICD-10-CM | POA: Diagnosis present

## 2024-04-10 DIAGNOSIS — R7989 Other specified abnormal findings of blood chemistry: Secondary | ICD-10-CM | POA: Diagnosis not present

## 2024-04-10 DIAGNOSIS — E872 Acidosis, unspecified: Secondary | ICD-10-CM | POA: Diagnosis present

## 2024-04-10 DIAGNOSIS — L89323 Pressure ulcer of left buttock, stage 3: Secondary | ICD-10-CM | POA: Diagnosis present

## 2024-04-10 DIAGNOSIS — K810 Acute cholecystitis: Secondary | ICD-10-CM

## 2024-04-10 DIAGNOSIS — N179 Acute kidney failure, unspecified: Secondary | ICD-10-CM | POA: Diagnosis not present

## 2024-04-10 DIAGNOSIS — K8051 Calculus of bile duct without cholangitis or cholecystitis with obstruction: Secondary | ICD-10-CM | POA: Diagnosis not present

## 2024-04-10 DIAGNOSIS — R112 Nausea with vomiting, unspecified: Secondary | ICD-10-CM | POA: Diagnosis not present

## 2024-04-10 DIAGNOSIS — F4322 Adjustment disorder with anxiety: Secondary | ICD-10-CM | POA: Diagnosis not present

## 2024-04-10 DIAGNOSIS — L89156 Pressure-induced deep tissue damage of sacral region: Secondary | ICD-10-CM | POA: Diagnosis present

## 2024-04-10 DIAGNOSIS — R1111 Vomiting without nausea: Secondary | ICD-10-CM | POA: Diagnosis not present

## 2024-04-10 DIAGNOSIS — R531 Weakness: Secondary | ICD-10-CM | POA: Diagnosis not present

## 2024-04-10 DIAGNOSIS — K8020 Calculus of gallbladder without cholecystitis without obstruction: Secondary | ICD-10-CM | POA: Diagnosis not present

## 2024-04-10 DIAGNOSIS — I4819 Other persistent atrial fibrillation: Secondary | ICD-10-CM | POA: Diagnosis not present

## 2024-04-10 DIAGNOSIS — Z7901 Long term (current) use of anticoagulants: Secondary | ICD-10-CM

## 2024-04-10 DIAGNOSIS — R932 Abnormal findings on diagnostic imaging of liver and biliary tract: Secondary | ICD-10-CM | POA: Diagnosis not present

## 2024-04-10 DIAGNOSIS — Z825 Family history of asthma and other chronic lower respiratory diseases: Secondary | ICD-10-CM

## 2024-04-10 DIAGNOSIS — I2699 Other pulmonary embolism without acute cor pulmonale: Secondary | ICD-10-CM | POA: Diagnosis present

## 2024-04-10 DIAGNOSIS — K838 Other specified diseases of biliary tract: Secondary | ICD-10-CM | POA: Diagnosis not present

## 2024-04-10 HISTORY — DX: Adjustment disorder with anxiety: F43.22

## 2024-04-10 HISTORY — DX: Calculus of bile duct without cholangitis or cholecystitis without obstruction: K80.50

## 2024-04-10 LAB — CBC WITH DIFFERENTIAL/PLATELET
Abs Immature Granulocytes: 0.08 10*3/uL — ABNORMAL HIGH (ref 0.00–0.07)
Basophils Absolute: 0.1 10*3/uL (ref 0.0–0.1)
Basophils Relative: 0 %
Eosinophils Absolute: 0.1 10*3/uL (ref 0.0–0.5)
Eosinophils Relative: 1 %
HCT: 43.7 % (ref 36.0–46.0)
Hemoglobin: 14.4 g/dL (ref 12.0–15.0)
Immature Granulocytes: 1 %
Lymphocytes Relative: 7 %
Lymphs Abs: 1 10*3/uL (ref 0.7–4.0)
MCH: 35.2 pg — ABNORMAL HIGH (ref 26.0–34.0)
MCHC: 33 g/dL (ref 30.0–36.0)
MCV: 106.8 fL — ABNORMAL HIGH (ref 80.0–100.0)
Monocytes Absolute: 0.6 10*3/uL (ref 0.1–1.0)
Monocytes Relative: 5 %
Neutro Abs: 11.6 10*3/uL — ABNORMAL HIGH (ref 1.7–7.7)
Neutrophils Relative %: 86 %
Platelets: 180 10*3/uL (ref 150–400)
RBC: 4.09 MIL/uL (ref 3.87–5.11)
RDW: 15.1 % (ref 11.5–15.5)
WBC: 13.4 10*3/uL — ABNORMAL HIGH (ref 4.0–10.5)
nRBC: 0 % (ref 0.0–0.2)

## 2024-04-10 LAB — TYPE AND SCREEN
ABO/RH(D): O POS
Antibody Screen: NEGATIVE

## 2024-04-10 LAB — ABO/RH: ABO/RH(D): O POS

## 2024-04-10 LAB — COMPREHENSIVE METABOLIC PANEL WITH GFR
ALT: 219 U/L — ABNORMAL HIGH (ref 0–44)
AST: 628 U/L — ABNORMAL HIGH (ref 15–41)
Albumin: 2.7 g/dL — ABNORMAL LOW (ref 3.5–5.0)
Alkaline Phosphatase: 216 U/L — ABNORMAL HIGH (ref 38–126)
Anion gap: 12 (ref 5–15)
BUN: 15 mg/dL (ref 8–23)
CO2: 20 mmol/L — ABNORMAL LOW (ref 22–32)
Calcium: 8.9 mg/dL (ref 8.9–10.3)
Chloride: 108 mmol/L (ref 98–111)
Creatinine, Ser: 1.31 mg/dL — ABNORMAL HIGH (ref 0.44–1.00)
GFR, Estimated: 39 mL/min — ABNORMAL LOW (ref >60)
Glucose, Bld: 135 mg/dL — ABNORMAL HIGH (ref 70–99)
Potassium: 3.6 mmol/L (ref 3.5–5.1)
Sodium: 140 mmol/L (ref 135–145)
Total Bilirubin: 4.1 mg/dL — ABNORMAL HIGH (ref 0.0–1.2)
Total Protein: 5.6 g/dL — ABNORMAL LOW (ref 6.5–8.1)

## 2024-04-10 LAB — LIPASE, BLOOD: Lipase: 21 U/L (ref 11–51)

## 2024-04-10 LAB — ACETAMINOPHEN LEVEL: Acetaminophen (Tylenol), Serum: <10 ug/mL — ABNORMAL LOW (ref 10–30)

## 2024-04-10 MED ORDER — SODIUM CHLORIDE 0.9 % IV BOLUS
500.0000 mL | Freq: Once | INTRAVENOUS | Status: AC
  Administered 2024-04-10: 500 mL via INTRAVENOUS

## 2024-04-10 MED ORDER — ONDANSETRON HCL 4 MG PO TABS: 4.0000 mg | ORAL_TABLET | Freq: Four times a day (QID) | ORAL | Status: DC | PRN

## 2024-04-10 MED ORDER — ACETAMINOPHEN 650 MG RE SUPP: 650.0000 mg | Freq: Four times a day (QID) | RECTAL | Status: DC | PRN

## 2024-04-10 MED ORDER — METRONIDAZOLE 500 MG/100ML IV SOLN
500.0000 mg | Freq: Two times a day (BID) | INTRAVENOUS | Status: DC
  Administered 2024-04-10 – 2024-04-14 (×8): 500 mg via INTRAVENOUS
  Filled 2024-04-10 (×8): qty 100

## 2024-04-10 MED ORDER — ONDANSETRON HCL 4 MG/2ML IJ SOLN
4.0000 mg | Freq: Four times a day (QID) | INTRAMUSCULAR | Status: DC | PRN
  Administered 2024-04-12: 4 mg via INTRAVENOUS
  Filled 2024-04-10: qty 2

## 2024-04-10 MED ORDER — ALBUTEROL SULFATE (2.5 MG/3ML) 0.083% IN NEBU: 2.5000 mg | INHALATION_SOLUTION | RESPIRATORY_TRACT | Status: DC | PRN

## 2024-04-10 MED ORDER — MIDODRINE HCL 5 MG PO TABS
5.0000 mg | ORAL_TABLET | Freq: Two times a day (BID) | ORAL | Status: DC
  Administered 2024-04-11 – 2024-04-14 (×8): 5 mg via ORAL
  Filled 2024-04-10 (×8): qty 1

## 2024-04-10 MED ORDER — METOPROLOL TARTRATE 12.5 MG HALF TABLET
12.5000 mg | ORAL_TABLET | Freq: Two times a day (BID) | ORAL | Status: DC
  Administered 2024-04-11 – 2024-04-13 (×5): 12.5 mg via ORAL
  Filled 2024-04-10 (×5): qty 1

## 2024-04-10 MED ORDER — HYDROCODONE-ACETAMINOPHEN 5-325 MG PO TABS: 1.0000 | ORAL_TABLET | ORAL | Status: DC | PRN

## 2024-04-10 MED ORDER — MORPHINE SULFATE (PF) 2 MG/ML IV SOLN: 2.0000 mg | INTRAVENOUS | Status: DC | PRN

## 2024-04-10 MED ORDER — POLYETHYLENE GLYCOL 3350 17 G PO PACK: 17.0000 g | PACK | Freq: Every day | ORAL | Status: DC | PRN

## 2024-04-10 MED ORDER — ACETAMINOPHEN 325 MG PO TABS
650.0000 mg | ORAL_TABLET | Freq: Four times a day (QID) | ORAL | Status: DC | PRN
  Administered 2024-04-13: 650 mg via ORAL
  Filled 2024-04-10: qty 2

## 2024-04-10 MED ORDER — SODIUM CHLORIDE 0.9 % IV SOLN
2.0000 g | INTRAVENOUS | Status: DC
  Administered 2024-04-11 – 2024-04-14 (×4): 2 g via INTRAVENOUS
  Filled 2024-04-10 (×4): qty 20

## 2024-04-10 MED ORDER — RIVAROXABAN 10 MG PO TABS
20.0000 mg | ORAL_TABLET | Freq: Every day | ORAL | Status: DC
  Administered 2024-04-11: 20 mg via ORAL
  Filled 2024-04-10: qty 2

## 2024-04-10 MED ORDER — LACTATED RINGERS IV SOLN: INTRAVENOUS | Status: AC

## 2024-04-10 MED ORDER — SODIUM CHLORIDE 0.9 % IV SOLN
1.0000 g | Freq: Once | INTRAVENOUS | Status: AC
  Administered 2024-04-10: 1 g via INTRAVENOUS
  Filled 2024-04-10: qty 10

## 2024-04-10 MED ORDER — PANTOPRAZOLE SODIUM 40 MG PO TBEC
40.0000 mg | DELAYED_RELEASE_TABLET | Freq: Every day | ORAL | Status: DC
  Administered 2024-04-11 – 2024-04-14 (×4): 40 mg via ORAL
  Filled 2024-04-10 (×5): qty 1

## 2024-04-10 MED ORDER — ONDANSETRON HCL 4 MG/2ML IJ SOLN
4.0000 mg | Freq: Once | INTRAMUSCULAR | Status: AC
  Administered 2024-04-10: 4 mg via INTRAVENOUS
  Filled 2024-04-10: qty 2

## 2024-04-10 NOTE — ED Triage Notes (Signed)
 Pt BIB [facility] from home due hypotension and GI bleed.  Husband found patient on toilet unresponsive 58 palpated.  N/v 0200 04/10/24 dark colored stools with coffee ground emesis.  Pt does take Xarelto .  aOX2.  20g left forearm.  ns; LR VS BP 118/60, hr 110, SpO2 95%

## 2024-04-10 NOTE — ED Notes (Signed)
 Please give an update to Porfirio Bristol (spouse), number under emergency contacts

## 2024-04-10 NOTE — ED Notes (Signed)
 Patient transported to [place]

## 2024-04-10 NOTE — ED Provider Notes (Signed)
 [place] EMERGENCY DEPARTMENT AT [facility] Provider Note   CSN: [id number] Arrival date & time: 04/10/24  1639     History  Chief complaint vomiting  Stacy Valencia is a 89 y.o. female.   Emesis    Patient has history of PE acid reflux DVT hip fractures, acute kidney injury, anemia atrial fibrillation, rhabdomyolysis, acute respiratory failure, who presents to the ED with complaints of nausea and vomiting.  Patient states she started having symptoms last night.  She began having trouble with nausea and vomiting.  Patient states that she vomited a number of times.  She has been bringing up brown-colored emesis.  She states she has not noticed any blood in her emesis.  She has not noticed any bloody stools.  Patient denies any abdominal pain.  No fevers.  Home Medications Prior to Admission medications   Medication Sig Start Date End Date Taking? Authorizing Provider  acetaminophen  (TYLENOL ) 325 MG tablet Take 2 tablets (650 mg total) by mouth every 6 (six) hours as needed for mild pain (pain score 1-3) (or Fever >/= 101). 11/24/23   Elgergawy, Ardia Kraft, MD  metoprolol  tartrate (LOPRESSOR ) 25 MG tablet Take 0.5 tablets (12.5 mg total) by mouth 2 (two) times daily. 08/09/20   Angiulli, Everlyn Hockey, PA-C  midodrine  (PROAMATINE ) 5 MG tablet Take 1 tablet (5 mg total) by mouth in the morning and at bedtime. Take 5 mg at 8AM and 5mg  at [facility] 03/21/24   Turner, Rufus Council, MD  omeprazole  (PRILOSEC) 20 MG capsule Take 20 mg by mouth daily. 05/23/1998   [provider]  pantoprazole  (PROTONIX ) 40 MG tablet Take 1 tablet (40 mg total) by mouth daily at 12 noon. 11/24/23   Elgergawy, Ardia Kraft, MD  XARELTO  20 MG TABS tablet TAKE 1 TABLET BY MOUTH EVERY DAY 04/15/22   Renna Cary, MD      Allergies    Sulfa antibiotics    Review of Systems   Review of Systems  Gastrointestinal:  Positive for vomiting.    Physical Exam Updated Vital Signs BP 120/81   Pulse 100   Temp 98.6 F  (37 C) (Oral)   Resp 20   Ht 1.651 m (5\' 5" )   Wt 68 kg   SpO2 97%   BMI 24.96 kg/m  Physical Exam Vitals and nursing note reviewed.  Constitutional:      Appearance: She is well-developed. She is ill-appearing.  HENT:     Head: Normocephalic and atraumatic.     Right Ear: External ear normal.     Left Ear: External ear normal.  Eyes:     General: No scleral icterus.       Right eye: No discharge.        Left eye: No discharge.     Conjunctiva/sclera: Conjunctivae normal.  Neck:     Trachea: No tracheal deviation.  Cardiovascular:     Rate and Rhythm: Regular rhythm. Tachycardia present.  Pulmonary:     Effort: Pulmonary effort is normal. No respiratory distress.     Breath sounds: Normal breath sounds. No stridor. No wheezing or rales.  Abdominal:     General: Bowel sounds are normal. There is no distension.     Palpations: Abdomen is soft.     Tenderness: There is no abdominal tenderness. There is no guarding or rebound.  Musculoskeletal:        General: No tenderness or deformity.     Cervical back: Neck supple.  Skin:  General: Skin is warm and dry.     Findings: No rash.  Neurological:     General: No focal deficit present.     Mental Status: She is alert.     Cranial Nerves: No cranial nerve deficit, dysarthria or facial asymmetry.     Sensory: No sensory deficit.     Motor: No abnormal muscle tone or seizure activity.     Coordination: Coordination normal.  Psychiatric:        Mood and Affect: Mood normal.     ED Results / Procedures / Treatments   Labs (all labs ordered are listed, but only abnormal results are displayed) Labs Reviewed  COMPREHENSIVE METABOLIC PANEL WITH GFR - Abnormal; Notable for the following components:      Result Value   CO2 20 (*)    Glucose, Bld 135 (*)    Creatinine, Ser 1.31 (*)    Total Protein 5.6 (*)    Albumin 2.7 (*)    AST 628 (*)    ALT 219 (*)    Alkaline Phosphatase 216 (*)    Total Bilirubin 4.1 (*)     GFR, Estimated 39 (*)    All other components within normal limits  CBC WITH DIFFERENTIAL/PLATELET - Abnormal; Notable for the following components:   WBC 13.4 (*)    MCV 106.8 (*)    MCH 35.2 (*)    Neutro Abs 11.6 (*)    Abs Immature Granulocytes 0.08 (*)    All other components within normal limits  LIPASE, BLOOD  URINALYSIS, ROUTINE W REFLEX MICROSCOPIC  TYPE AND SCREEN  ABO/RH    EKG None  Radiology US  Abdomen Limited RUQ (LIVER/GB) Result Date: 04/10/2024 CLINICAL DATA:  Abdominal pain EXAM: ULTRASOUND ABDOMEN LIMITED RIGHT UPPER QUADRANT COMPARISON:  CT abdomen pelvis 11/19/2023 FINDINGS: Gallbladder: Cholelithiasis. No wall thickening or pericholecystic fluid. Negative sonographic Murphy's sign. Common bile duct: Diameter: 8 mm. Liver: No focal lesion identified. Increased parenchymal echogenicity. Portal vein is patent on color Doppler imaging with normal direction of blood flow towards the liver. Other: None. IMPRESSION: 1. Cholelithiasis without evidence of acute cholecystitis. 2. Hepatic steatosis. Electronically Signed   By: Rozell Cornet M.D.   On: 04/10/2024 19:57   DG Abd Acute W/Chest Result Date: 04/10/2024 CLINICAL DATA:  Vomiting EXAM: DG ABDOMEN ACUTE WITH 1 VIEW CHEST COMPARISON:  11/19/2023 FINDINGS: Heart size and mediastinal contours are within normal limits. Lungs are clear. No effusion. No free air. Normal bowel gas pattern. There are no abnormal calcifications. Spondylitic changes in the lumbar spine. Left femoral neck fixation hardware partially visualized. IMPRESSION: No acute findings. Electronically Signed   By: Nicoletta Barrier M.D.   On: 04/10/2024 18:27    Procedures Procedures    Medications Ordered in ED Medications  ondansetron  (ZOFRAN ) injection 4 mg (4 mg Intravenous Given 04/10/24 1704)  sodium chloride  0.9 % bolus 500 mL (0 mLs Intravenous Stopped 04/10/24 1823)    ED Course/ Medical Decision Making/ A&P Clinical Course as of 04/10/24 2042   Sun Apr 10, 2024  1653 Patient refuses rectal exam states she does not think there is any blood in her stool [JK]  1802 CBC with Diff(!) White blood cell count elevated at 13 [JK]  1821 LFTs abnormal with elevated AST and bilirubin. [JK]  1838 No acute findings on acute abdominal series [JK]  2003 Ultrasound shows evidence of cholelithiasis without acute cholecystitis. [JK]  2024 GI consult placed through secure chat to Dr. Nandigam for routine consult in  am [JK]    Clinical Course User Index [JK] Trish Furl, MD                                 Medical Decision Making Amount and/or Complexity of Data Reviewed Labs: ordered. Decision-making details documented in ED Course. Radiology: ordered.  Risk Prescription drug management. Decision regarding hospitalization.   Patient presented to ED with complaints of abdominal pain vomiting.  In the ED patient was noted to have tenderness palpation in epigastric region.  Patient's laboratory tests were notable for significant elevation in her LFTs and her bilirubin.  Patient's ultrasound shows evidence of cholelithiasis but no cholecystitis.  I am concerned about possible choledocholithiasis causing her symptoms.  Will start the patient on empiric antibiotics with her elevated white blood cell count and symptoms.  Will consult with GI and admit to the hospital for further treatment. CAse discussed with Dr Arnita Bible       Final Clinical Impression(s) / ED Diagnoses Final diagnoses:  Calculus of gallbladder with biliary obstruction but without cholecystitis    Rx / DC Orders ED Discharge Orders     None         Trish Furl, MD 04/10/24 2205

## 2024-04-10 NOTE — ED Notes (Signed)
 This nurse asked phlebotomy to collect blood cultures before antibiotic is to be administered.

## 2024-04-10 NOTE — ED Notes (Signed)
Report given to 5 C 

## 2024-04-10 NOTE — H&P (Signed)
 History and Physical    Stacy Valencia:[id number] DOB: [birth year] DOA: 04/10/2024  PCP: Renetta Carter, MD (Inactive)   Patient coming from:  Home   I have personally briefly reviewed patient's old medical records in [facility]  Chief Complaint:  Nausea and vomiting, poor oral intake x 3 days  [name] with medical history significant of past medical history of paroxysmal atrial fibrillation, history of PE and DVT on chronic anticoagulation, adjustment disorder with anxiety, gastroesophageal reflux disease, transient hypotension with history of falls came to hospital with complaint of nausea and vomiting x 3 days  Patient stated that for the past 3 days she has been having intermittent episode of nausea and vomiting, associated with poor oral intake.  Patient denied having abdominal pain.  She described vomiting as brown color, without any blood content.  She reported no melena. She denies having any fever or chills.  Due to symptoms she came to ED for evaluation.  ED Course:   In the ED she was afebrile with temperature 98.6 F.. Blood pressure was stable 120/81, with tachycardia 112/min.  No hypoxia, 97% on room air.  CBC revealed leukocytosis 13.4, no anemia. Chemistry showed elevation of creatinine 1.3 with mild nongap metabolic acidosis CO2 20. LFTs were elevated with AST 628, ALT 219 and total bilirubin 4.1, alkaline phosphatase 216 Abdominal ultrasound showing cholelithiasis without acute cholecystitis and hepatic steatosis.  Patient was treated empirically with IV antibiotics ceftriaxone  and Flagyl , IV fluids and will be admitted to hospital for further evaluation and treatment  Review of Systems: Review of Systems  Constitutional:  Positive for malaise/fatigue. Negative for chills and fever.  HENT:  Negative for congestion, ear discharge, hearing loss, sinus pain and tinnitus.   Eyes:  Negative for blurred vision, double vision,  photophobia and pain.  Respiratory:  Negative for cough, hemoptysis, sputum production and shortness of breath.   Cardiovascular:  Negative for chest pain, palpitations, orthopnea and claudication.  Gastrointestinal:  Positive for nausea and vomiting. Negative for abdominal pain, constipation and diarrhea.  Genitourinary:  Negative for dysuria, frequency, hematuria and urgency.  Musculoskeletal:  Negative for back pain, joint pain, myalgias and neck pain.  Skin:  Negative for rash.  Neurological:  Negative for dizziness, tingling, sensory change, focal weakness, seizures and headaches.  Endo/Heme/Allergies:  Does not bruise/bleed easily.  Psychiatric/Behavioral:  Negative for depression and substance abuse. The patient is not nervous/anxious.    Past Medical History:  Diagnosis Date   Adjustment disorder with anxious mood    Atrial fibrillation (HCC) 11/19/2023   DVT (deep venous thrombosis) (HCC) 01/07/2013   GERD (gastroesophageal reflux disease)    PE (pulmonary embolism) 12/23/2003   Past Surgical History:  Procedure Laterality Date   ABDOMINAL HYSTERECTOMY     APPENDECTOMY     arthroscopic shoulder surgery Right 10/28/2016   decompression of SAD/DCR   INTRAMEDULLARY (IM) NAIL INTERTROCHANTERIC Left 07/23/2020   Procedure: INTRAMEDULLARY (IM) NAIL INTERTROCHANTRIC HIP;  Surgeon: Adonica Hoose, MD;  Location: [facility] OR;  Service: Orthopedics;  Laterality: Left;    Social History  reports that she has never smoked. She has never used smokeless tobacco. She reports that she does not drink alcohol and does not use drugs.  Allergies  Allergen Reactions   Sulfa Antibiotics Nausea And Vomiting    Pt states only in tablet form    Family History  Problem Relation Age of Onset   Pulmonary embolism Mother  Other Father        Head trauma   Emphysema Sister    Alcoholism Sister    Down syndrome Brother    Prior to Admission medications   Medication Sig Start Date End Date  Taking? Authorizing Provider  acetaminophen  (TYLENOL ) 500 MG tablet Take 500 mg by mouth every 6 (six) hours as needed for mild pain (pain score 1-3).   Yes [provider]  Cholecalciferol 50 MCG (2000 UT) TABS Take by mouth.   Yes [provider]  metoprolol  tartrate (LOPRESSOR ) 25 MG tablet Take 0.5 tablets (12.5 mg total) by mouth 2 (two) times daily. 08/09/20  Yes Angiulli, Everlyn Hockey, PA-C  midodrine  (PROAMATINE ) 5 MG tablet Take 1 tablet (5 mg total) by mouth in the morning and at bedtime. Take 5 mg at 8AM and 5mg  at [facility] 03/21/24  Yes Turner, Rufus Council, MD  omeprazole  (PRILOSEC) 20 MG capsule Take 20 mg by mouth daily. 05/23/1998  Yes [provider]  XARELTO  20 MG TABS tablet TAKE 1 TABLET BY MOUTH EVERY DAY 04/15/22  Yes Renna Cary, MD    Physical Exam: Vitals:   04/10/24 1649 04/10/24 1651 04/10/24 1815  BP: 114/70  120/81  Pulse: (!) 112  100  Resp: 17  20  Temp: 98.6 F (37 C)    TempSrc: Oral    SpO2: 97% 99% 97%  Weight: 68 kg    Height: 5\' 5"  (1.651 m)      Constitutional: NAD, calm, comfortable; appears ill Vitals:   04/10/24 1649 04/10/24 1651 04/10/24 1815  BP: 114/70  120/81  Pulse: (!) 112  100  Resp: 17  20  Temp: 98.6 F (37 C)    TempSrc: Oral    SpO2: 97% 99% 97%  Weight: 68 kg    Height: 5\' 5"  (1.651 m)     Eyes: PERRL, lids and conjunctivae normal ENMT: Mucous membranes are moist. Posterior pharynx clear of any exudate or lesions.Normal dentition.  Neck: normal, supple, no masses, no thyromegaly Respiratory: clear to auscultation bilaterally, no wheezing, no crackles. Normal respiratory effort. No accessory muscle use.  Cardiovascular: Regular rate and rhythm, no murmurs / rubs / gallops. No extremity edema. 2+ pedal pulses. No carotid bruits.  Abdomen: no tenderness, no masses palpated. No hepatosplenomegaly. Bowel sounds positive.  Musculoskeletal: no clubbing / cyanosis. No joint deformity upper and lower extremities. Good  ROM, no contractures. Normal muscle tone.  Skin: no rashes, lesions, ulcers. No induration Neurologic: CN 2-12 grossly intact. Sensation intact, DTR normal. Strength 5/5 in all 4.  Psychiatric: Normal judgment and insight. Alert and oriented x 3. Normal mood.   Labs on Admission: I have personally reviewed following labs and imaging studies  CBC: Recent Labs  Lab 04/10/24 1710  WBC 13.4*  NEUTROABS 11.6*  HGB 14.4  HCT 43.7  MCV 106.8*  PLT 180    Basic Metabolic Panel: Recent Labs  Lab 04/10/24 1710  NA 140  K 3.6  CL 108  CO2 20*  GLUCOSE 135*  BUN 15  CREATININE 1.31*  CALCIUM  8.9    GFR: Estimated Creatinine Clearance: 26.2 mL/min (A) (by C-G formula based on SCr of 1.31 mg/dL (H)).  Liver Function Tests: Recent Labs  Lab 04/10/24 1710  AST 628*  ALT 219*  ALKPHOS 216*  BILITOT 4.1*  PROT 5.6*  ALBUMIN 2.7*    Urine analysis:    Component Value Date/Time   COLORURINE YELLOW 11/19/2023 1850   APPEARANCEUR CLEAR 11/19/2023 1850  LABSPEC 1.028 11/19/2023 1850   PHURINE 5.0 11/19/2023 1850   GLUCOSEU NEGATIVE 11/19/2023 1850   HGBUR NEGATIVE 11/19/2023 1850   BILIRUBINUR NEGATIVE 11/19/2023 1850   KETONESUR NEGATIVE 11/19/2023 1850   PROTEINUR NEGATIVE 11/19/2023 1850   NITRITE NEGATIVE 11/19/2023 1850   LEUKOCYTESUR NEGATIVE 11/19/2023 1850    Radiological Exams on Admission: US  Abdomen Limited RUQ (LIVER/GB) Result Date: 04/10/2024 CLINICAL DATA:  Abdominal pain EXAM: ULTRASOUND ABDOMEN LIMITED RIGHT UPPER QUADRANT COMPARISON:  CT abdomen pelvis 11/19/2023 FINDINGS: Gallbladder: Cholelithiasis. No wall thickening or pericholecystic fluid. Negative sonographic Murphy's sign. Common bile duct: Diameter: 8 mm. Liver: No focal lesion identified. Increased parenchymal echogenicity. Portal vein is patent on color Doppler imaging with normal direction of blood flow towards the liver. Other: None. IMPRESSION: 1. Cholelithiasis without evidence of acute  cholecystitis. 2. Hepatic steatosis. Electronically Signed   By: Rozell Cornet M.D.   On: 04/10/2024 19:57   DG Abd Acute W/Chest Result Date: 04/10/2024 CLINICAL DATA:  Vomiting EXAM: DG ABDOMEN ACUTE WITH 1 VIEW CHEST COMPARISON:  11/19/2023 FINDINGS: Heart size and mediastinal contours are within normal limits. Lungs are clear. No effusion. No free air. Normal bowel gas pattern. There are no abnormal calcifications. Spondylitic changes in the lumbar spine. Left femoral neck fixation hardware partially visualized. IMPRESSION: No acute findings. Electronically Signed   By: Nicoletta Barrier M.D.   On: 04/10/2024 18:27   EKG: Independently reviewed.  EKG revealed, atrial flutter with variable block at 109/min.  Assessment/Plan Principal Problem:   Choledocholithiasis Active Problems:   AKI (acute kidney injury) (HCC)   Atrial fibrillation (HCC)   Pulmonary embolism (HCC)   GERD (gastroesophageal reflux disease)   DVT (deep venous thrombosis) (HCC)   Adjustment disorder with anxious mood   Choledocholithiasis  Elevated liver function test Patient presents to the ED with persistent nausea vomiting, dehydration x 3 days. Workup in ED concerning for cholelithiasis, with markedly elevated LFTs with obstructive pattern. Her LFTs has been elevated previously, and now are getting progressively worse. Previously total bilirubin was 1.8 in November 2024, with only mild elevation of AST @ 56, normal ALT Plan. Admit to hospital for further evaluation.   Obtain MRCP to rule out intra biliary obstruction, as common bile duct on right upper quadrant sonogram was normal at 8 mm.    Check acetaminophen  level, direct bilirubin, acute hepatitis profile. Cover empirically with IV ceftriaxone , Flagyl  for intra-abdominal infection. Obtain GI consult in the morning, requested by ED.  Acute kidney injury Suspect this may be related to dehydration, creatinine 1.3 with normal 0.7 in November 2024. Plan. Continue  IV fluids, monitor renal function daily.  Paroxysmal atrial fibrillation Continue current treatment with metoprolol  for rate control. Patient is on full anticoagulation with Xarelto ..  History of PE and DVT  Continue chronic anticoagulation with Xarelto   Adjustment disorder with anxiety Stable.  Patient is not on any specific therapy  Gastroesophageal reflux disease Continue pantoprazole   Transient hypotension with history of falls Continue midodrine .  PT evaluation  DVT prophylaxis: Xarelto   Code Status:        Full code patient confirmed CODE STATUS is full code Family Communication:  Discussed case with patient Disposition Plan:   Patient is from:  Home  Anticipated DC to:  Home    Anticipated DC date:  04/12/2024  Anticipated DC barriers: None   Consults called:  GI consult was requested by ED provider, to be seen in AM  Admission status:  Inpatient    Severity of  Illness: The appropriate patient status for this patient is INPATIENT. Inpatient status is judged to be reasonable and necessary in order to provide the required intensity of service to ensure the patient's safety. The patient's presenting symptoms, physical exam findings, and initial radiographic and laboratory data in the context of their chronic comorbidities is felt to place them at high risk for further clinical deterioration. Furthermore, it is not anticipated that the patient will be medically stable for discharge from the hospital within 2 midnights of admission.   * I certify that at the point of admission it is my clinical judgment that the patient will require inpatient hospital care spanning beyond 2 midnights from the point of admission due to high intensity of service, high risk for further deterioration and high frequency of surveillance required.Carolan Chuck MD [facility]  How to contact the [facility] Attending or Consulting provider 7A - 7P or covering provider during after hours 7P -7A,  for this patient?   Check the care team in [facility] and look for a) attending/consulting [facility] provider listed and b) the [facility] team listed Log into [email or web address] and use [place]'s universal password to access. If you do not have the password, please contact the hospital operator. Locate the [facility] provider you are looking for under [facility] and page to a number that you can be directly reached. If you still have difficulty reaching the provider, please page the [facility] (Director on Call) for the Hospitalists listed on amion for assistance.  04/10/2024, 10:26 PM

## 2024-04-11 ENCOUNTER — Encounter (HOSPITAL_COMMUNITY): Payer: Self-pay | Admitting: Internal Medicine

## 2024-04-11 ENCOUNTER — Inpatient Hospital Stay (HOSPITAL_COMMUNITY)

## 2024-04-11 DIAGNOSIS — R112 Nausea with vomiting, unspecified: Secondary | ICD-10-CM

## 2024-04-11 DIAGNOSIS — D72829 Elevated white blood cell count, unspecified: Secondary | ICD-10-CM

## 2024-04-11 DIAGNOSIS — R7989 Other specified abnormal findings of blood chemistry: Secondary | ICD-10-CM | POA: Diagnosis not present

## 2024-04-11 DIAGNOSIS — K810 Acute cholecystitis: Secondary | ICD-10-CM

## 2024-04-11 DIAGNOSIS — K8020 Calculus of gallbladder without cholecystitis without obstruction: Secondary | ICD-10-CM

## 2024-04-11 DIAGNOSIS — K8050 Calculus of bile duct without cholangitis or cholecystitis without obstruction: Secondary | ICD-10-CM | POA: Diagnosis not present

## 2024-04-11 HISTORY — DX: Acute cholecystitis: K81.0

## 2024-04-11 LAB — COMPREHENSIVE METABOLIC PANEL WITH GFR
ALT: 178 U/L — ABNORMAL HIGH (ref 0–44)
AST: 399 U/L — ABNORMAL HIGH (ref 15–41)
Albumin: 2.4 g/dL — ABNORMAL LOW (ref 3.5–5.0)
Alkaline Phosphatase: 182 U/L — ABNORMAL HIGH (ref 38–126)
Anion gap: 8 (ref 5–15)
BUN: 17 mg/dL (ref 8–23)
CO2: 25 mmol/L (ref 22–32)
Calcium: 8.6 mg/dL — ABNORMAL LOW (ref 8.9–10.3)
Chloride: 108 mmol/L (ref 98–111)
Creatinine, Ser: 1.05 mg/dL — ABNORMAL HIGH (ref 0.44–1.00)
GFR, Estimated: 51 mL/min — ABNORMAL LOW (ref >60)
Glucose, Bld: 97 mg/dL (ref 70–99)
Potassium: 3.7 mmol/L (ref 3.5–5.1)
Sodium: 141 mmol/L (ref 135–145)
Total Bilirubin: 3.8 mg/dL — ABNORMAL HIGH (ref 0.0–1.2)
Total Protein: 5.3 g/dL — ABNORMAL LOW (ref 6.5–8.1)

## 2024-04-11 LAB — CBC
HCT: 39.5 % (ref 36.0–46.0)
Hemoglobin: 13.3 g/dL (ref 12.0–15.0)
MCH: 35.4 pg — ABNORMAL HIGH (ref 26.0–34.0)
MCHC: 33.7 g/dL (ref 30.0–36.0)
MCV: 105.1 fL — ABNORMAL HIGH (ref 80.0–100.0)
Platelets: 175 10*3/uL (ref 150–400)
RBC: 3.76 MIL/uL — ABNORMAL LOW (ref 3.87–5.11)
RDW: 15.4 % (ref 11.5–15.5)
WBC: 11.9 10*3/uL — ABNORMAL HIGH (ref 4.0–10.5)
nRBC: 0 % (ref 0.0–0.2)

## 2024-04-11 LAB — HEPATITIS PANEL, ACUTE
HCV Ab: NONREACTIVE
Hep A IgM: NONREACTIVE
Hep B C IgM: NONREACTIVE
Hepatitis B Surface Ag: NONREACTIVE

## 2024-04-11 LAB — BILIRUBIN, DIRECT: Bilirubin, Direct: 2.8 mg/dL — ABNORMAL HIGH (ref 0.0–0.2)

## 2024-04-11 MED ORDER — MEDIHONEY WOUND/BURN DRESSING EX PSTE
1.0000 | PASTE | Freq: Every day | CUTANEOUS | Status: DC
  Administered 2024-04-11 – 2024-04-14 (×4): 1 via TOPICAL
  Filled 2024-04-11: qty 44

## 2024-04-11 MED ORDER — GADOBUTROL 1 MMOL/ML IV SOLN
6.0000 mL | Freq: Once | INTRAVENOUS | Status: AC | PRN
  Administered 2024-04-11: 6 mL via INTRAVENOUS

## 2024-04-11 NOTE — Evaluation (Signed)
 Physical Therapy Evaluation Patient Details Name: Stacy Valencia MRN: [id number] DOB: December 29, 1932 Today's Date: 04/11/2024  History of Present Illness  Pt is 89 year old presented to [facility] on  04/10/24 for N/V. Pt with cholelithiasis. PMH - afib, UTI, falls, osteoporosis, PE, GERD, L hip IM nail 2021.  Clinical Impression  Pt admitted with above diagnosis and presents to PT with functional limitations due to deficits listed below (See PT problem list). Pt needs skilled PT to maximize independence and safety. Pt currently amb short distance in room but wants to work to be able to go home with husband. Likely will make steady progress and hopefully will be at a level he can assist her at home.          If plan is discharge home, recommend the following: A little help with walking and/or transfers;A little help with bathing/dressing/bathroom;Assist for transportation;Help with stairs or ramp for entrance   Can travel by private vehicle        Equipment Recommendations None recommended by PT  Recommendations for Other Services       Functional Status Assessment Patient has had a recent decline in their functional status and demonstrates the ability to make significant improvements in function in a reasonable and predictable amount of time.     Precautions / Restrictions Precautions Precautions: Fall Restrictions Weight Bearing Restrictions Per Provider Order: No      Mobility  Bed Mobility Overal bed mobility: Needs Assistance Bed Mobility: Supine to Sit, Sit to Supine     Supine to sit: Mod assist, HOB elevated Sit to supine: Min assist   General bed mobility comments: Assist to bring legs off of bed, elevate trunk into sitting and scoot to EOB. Assist to bring feet back up into bed.    Transfers Overall transfer level: Needs assistance Equipment used: Rollator (4 wheels) Transfers: Sit to/from Stand, Bed to chair/wheelchair/BSC Sit to Stand: Mod assist   Step pivot  transfers: Min assist       General transfer comment: Assist to power up and stablize    Ambulation/Gait Ambulation/Gait assistance: Min assist Gait Distance (Feet): 20 Feet Assistive device: Rollator (4 wheels) Gait Pattern/deviations: Step-through pattern, Decreased step length - right, Decreased step length - left, Shuffle, Trunk flexed Gait velocity: decr Gait velocity interpretation: <1.31 ft/sec, indicative of household ambulator   General Gait Details: assist for balance and support  Stairs            Wheelchair Mobility     Tilt Bed    Modified Rankin (Stroke Patients Only)       Balance Overall balance assessment: Needs assistance Sitting-balance support: No upper extremity supported, Feet supported Sitting balance-Leahy Scale: Fair     Standing balance support: Bilateral upper extremity supported, During functional activity, Reliant on assistive device for balance Standing balance-Leahy Scale: Poor Standing balance comment: walker and CGA for static standing                             Pertinent Vitals/Pain Pain Assessment Pain Assessment: Faces Faces Pain Scale: Hurts a little bit Pain Location: lt wrist with pressure on hand due to location of IV Pain Descriptors / Indicators: Grimacing, Guarding Pain Intervention(s): Limited activity within patient's tolerance, Monitored during session, Repositioned    Home Living Family/patient expects to be discharged to:: Private residence Living Arrangements: Spouse/significant other Available Help at Discharge: Family;Available 24 hours/day Type of Home: House Home Access: Ramped  entrance       Home Layout: One level Home Equipment: Agricultural consultant (2 wheels);Wheelchair - manual;Shower seat;BSC/3in1;Grab bars - tub/shower;Hand held shower head;Lift chair      Prior Function Prior Level of Function : Needs assist;History of Falls (last six months)             Mobility Comments: Uses  rolling walker and lift chair. Husband helps with standing ADLs Comments: husband helps with all ADLs, bathing, transfers     Extremity/Trunk Assessment   Upper Extremity Assessment Upper Extremity Assessment: Defer to OT evaluation    Lower Extremity Assessment Lower Extremity Assessment: Generalized weakness       Communication   Communication Communication: Impaired Factors Affecting Communication: Hearing impaired    Cognition Arousal: Alert Behavior During Therapy: WFL for tasks assessed/performed   PT - Cognitive impairments: No apparent impairments                         Following commands: Intact       Cueing Cueing Techniques: Verbal cues, Tactile cues     General Comments      Exercises     Assessment/Plan    PT Assessment Patient needs continued PT services  PT Problem List Decreased strength;Decreased activity tolerance;Decreased balance;Decreased mobility       PT Treatment Interventions DME instruction;Gait training;Stair training;Functional mobility training;Therapeutic activities;Therapeutic exercise;Balance training;Patient/family education    PT Goals (Current goals can be found in the Care Plan section)  Acute Rehab PT Goals Patient Stated Goal: return home PT Goal Formulation: With patient/family Time For Goal Achievement: 04/25/24 Potential to Achieve Goals: Good    Frequency Min 2X/week     Co-evaluation               AM-PAC PT "6 Clicks" Mobility  Outcome Measure Help needed turning from your back to your side while in a flat bed without using bedrails?: A Little Help needed moving from lying on your back to sitting on the side of a flat bed without using bedrails?: A Lot Help needed moving to and from a bed to a chair (including a wheelchair)?: A Little Help needed standing up from a chair using your arms (e.g., wheelchair or bedside chair)?: A Lot Help needed to walk in hospital room?: A Little Help needed  climbing 3-5 steps with a railing? : Total 6 Click Score: 14    End of Session Equipment Utilized During Treatment: Gait belt Activity Tolerance: Patient limited by fatigue Patient left: in bed;with call bell/phone within reach;with bed alarm set;with family/visitor present Nurse Communication: Mobility status PT Visit Diagnosis: Unsteadiness on feet (R26.81);Other abnormalities of gait and mobility (R26.89);Muscle weakness (generalized) (M62.81)    Time: 1610-9604 PT Time Calculation (min) (ACUTE ONLY): 30 min   Charges:   PT Evaluation $PT Eval Moderate Complexity: 1 Mod PT Treatments $Gait Training: 8-22 mins PT General Charges $$ ACUTE PT VISIT: 1 Visit         [facility] PT Acute Rehabilitation Services Office [phone]   Pura Browns [facility] 04/11/2024, 2:29 PM

## 2024-04-11 NOTE — Plan of Care (Signed)

## 2024-04-11 NOTE — Consult Note (Addendum)
 Consultation  Referring Provider: [facility]/ [place] Primary Care Physician:  Renetta Carter, MD (Inactive) Primary Gastroenterologist:  unassigned  Reason for Consultation: Acute nausea vomiting, cholelithiasis, elevated LFTs   Attending physician's note  I personally saw the patient and performed a substantive portion of this encounter (>50% time spent), including a complete performance of at least one of the key components (MDM, Hx and/or Exam), in conjunction with the APP.  I agree with the APP's note, impression, and recommendations with additional input as follows.    89 year old very pleasant female with epigastric pain, nausea and abdominal LFT MRCP consistent with cholelithiasis and choledocholithiasis with dilated CBD  Last dose of Xarelto  was this morning for history of PE DVT and A-fib Hold Xarelto  Continue to monitor LFT Continue broad-spectrum antibiotics  Continue full liquid diet as tolerated Will tentatively plan for ERCP with stone extraction on Wednesday with Dr. Venice Gillis, will be 48 hours off anticoagulation   The patient was provided an opportunity to ask questions and all were answered. The patient agreed with the plan and demonstrated an understanding of the instructions.  Stacy Valencia , MD [phone]     [name], who was admitted yesterday from skilled nursing facility after she had had several episodes of nausea and vomiting which started the day previous.  No documented fever or chills, no complaints of abdominal pain on admission. Patient has history of previous PE/DVT on Xarelto , GERD, atrial fibrillation, and prior history of anemia.  Workup in the emergency room with upper abdominal ultrasound revealed gallstones without gallbladder wall thickening, CBD of 8 mm and evidence of hepatic steatosis. Labs show WBC of 13.4/hemoglobin 14/hematocrit 43/MCV 106 Lipase 21 BUN 15/creatinine 1.31 T. bili 4.1/alk phos 216/AST  626/ALT 209.  MRI/MRCP was pending at the time I saw the patient, has finalized shows mild motion artifact, there are multiple small gallstones and mild pericholecystic fluid no significant intrahepatic biliary dilation, there is mild extrahepatic biliary dilation common hepatic duct measuring 6 mm, and CBD 10 mm, there are small stones present within the distal common bile duct, diffuse fatty replacement of the pancreas, stable prominent lymph nodes in the porta hepatis likely reactive.  Patient says she feels a little bit better today, she has not had any further nausea and vomiting and would like to try liquids  Today WBC down to 11.9/hemoglobin 13.3 T. bili 3.8/alk phos 182/AST 3999/ALT 178   Past Medical History:  Diagnosis Date   Adjustment disorder with anxious mood    Atrial fibrillation (HCC) 11/19/2023   DVT (deep venous thrombosis) (HCC) 01/07/2013   GERD (gastroesophageal reflux disease)    PE (pulmonary embolism) 12/23/2003    Past Surgical History:  Procedure Laterality Date   ABDOMINAL HYSTERECTOMY     APPENDECTOMY     arthroscopic shoulder surgery Right 10/28/2016   decompression of SAD/DCR   INTRAMEDULLARY (IM) NAIL INTERTROCHANTERIC Left 07/23/2020   Procedure: INTRAMEDULLARY (IM) NAIL INTERTROCHANTRIC HIP;  Surgeon: Adonica Hoose, MD;  Location: [facility] OR;  Service: Orthopedics;  Laterality: Left;    Prior to Admission medications   Medication Sig Start Date End Date Taking? Authorizing Provider  acetaminophen  (TYLENOL ) 500 MG tablet Take 500 mg by mouth every 6 (six) hours as needed for mild pain (pain score 1-3).   Yes [provider]  Cholecalciferol 50 MCG (2000 UT) TABS Take by mouth.   Yes [provider]  metoprolol  tartrate (LOPRESSOR ) 25 MG tablet Take 0.5  tablets (12.5 mg total) by mouth 2 (two) times daily. 08/09/20  Yes Angiulli, Everlyn Hockey, PA-C  midodrine  (PROAMATINE ) 5 MG tablet Take 1 tablet (5 mg total) by mouth in the morning and at  bedtime. Take 5 mg at 8AM and 5mg  at [facility] 03/21/24  Yes Turner, Rufus Council, MD  omeprazole  (PRILOSEC) 20 MG capsule Take 20 mg by mouth daily. 05/23/1998  Yes [provider]  XARELTO  20 MG TABS tablet TAKE 1 TABLET BY MOUTH EVERY DAY 04/15/22  Yes Shadad, Firas N, MD    Current Facility-Administered Medications  Medication Dose Route Frequency Provider Last Rate Last Admin   acetaminophen  (TYLENOL ) tablet 650 mg  650 mg Oral Q6H PRN Poplawski, Rafal, MD       Or   acetaminophen  (TYLENOL ) suppository 650 mg  650 mg Rectal Q6H PRN Poplawski, Rafal, MD       albuterol  (PROVENTIL ) (2.5 MG/3ML) 0.083% nebulizer solution 2.5 mg  2.5 mg Nebulization Q2H PRN Poplawski, Rafal, MD       cefTRIAXone  (ROCEPHIN ) 2 g in sodium chloride  0.9 % 100 mL IVPB  2 g Intravenous Q24H Poplawski, Rafal, MD 200 mL/hr at 04/11/24 0916 2 g at 04/11/24 0916   HYDROcodone -acetaminophen  (NORCO/VICODIN) 5-325 MG per tablet 1-2 tablet  1-2 tablet Oral Q4H PRN Poplawski, Rafal, MD       lactated ringers  infusion   Intravenous Continuous Poplawski, Rafal, MD 75 mL/hr at 04/10/24 2333 New Bag at 04/10/24 2333   leptospermum manuka honey (MEDIHONEY) paste 1 Application  1 Application Topical Daily Poplawski, Rafal, MD   1 Application at 04/11/24 0910   metoprolol  tartrate (LOPRESSOR ) tablet 12.5 mg  12.5 mg Oral BID Poplawski, Rafal, MD   12.5 mg at 04/11/24 6295   metroNIDAZOLE  (FLAGYL ) IVPB 500 mg  500 mg Intravenous Q12H Poplawski, Rafal, MD 100 mL/hr at 04/11/24 0919 500 mg at 04/11/24 0919   midodrine  (PROAMATINE ) tablet 5 mg  5 mg Oral BID WC Poplawski, Rafal, MD   5 mg at 04/11/24 2841   morphine  (PF) 2 MG/ML injection 2 mg  2 mg Intravenous Q2H PRN Poplawski, Rafal, MD       ondansetron  (ZOFRAN ) tablet 4 mg  4 mg Oral Q6H PRN Poplawski, Rafal, MD       Or   ondansetron  (ZOFRAN ) injection 4 mg  4 mg Intravenous Q6H PRN Poplawski, Rafal, MD       pantoprazole  (PROTONIX ) EC tablet 40 mg  40 mg Oral Daily Poplawski, Rafal,  MD   40 mg at 04/11/24 3244   polyethylene glycol (MIRALAX  / GLYCOLAX ) packet 17 g  17 g Oral Daily PRN Poplawski, Rafal, MD       rivaroxaban  (XARELTO ) tablet 20 mg  20 mg Oral Daily Poplawski, Rafal, MD        Allergies as of 04/10/2024 - Review Complete 04/10/2024  Allergen Reaction Noted   Sulfa antibiotics Nausea And Vomiting 01/05/2013    Family History  Problem Relation Age of Onset   Pulmonary embolism Mother    Other Father        Head trauma   Emphysema Sister    Alcoholism Sister    Down syndrome Brother     Social History   Socioeconomic History   Marital status: Married    Spouse name: Not on file   Number of children: 6   Years of education: Not on file   Highest education level: Not on file  Occupational History   Not on file  Tobacco Use   Smoking status: Never   Smokeless tobacco: Never  Vaping Use   Vaping status: Never Used  Substance and Sexual Activity   Alcohol use: No    Comment: occasionally   Drug use: No   Sexual activity: Not on file  Other Topics Concern   Not on file  Social History Narrative   6 Biological - 1 step-child   Social Drivers of Health   Financial Resource Strain: Not on file  Food Insecurity: No Food Insecurity (04/10/2024)   Hunger Vital Sign    Worried About Running Out of Food in the Last Year: Never true    Ran Out of Food in the Last Year: Never true  Transportation Needs: No Transportation Needs (04/10/2024)   PRAPARE - Administrator, Civil Service (Medical): No    Lack of Transportation (Non-Medical): No  Physical Activity: Not on file  Stress: Not on file  Social Connections: Moderately Integrated (04/10/2024)   Social Connection and Isolation Panel [NHANES]    Frequency of Communication with Friends and Family: Three times a week    Frequency of Social Gatherings with Friends and Family: Once a week    Attends Religious Services: 1 to 4 times per year    Active Member of [employer] or Organizations:  No    Attends Banker Meetings: Never    Marital Status: Married  Catering manager Violence: Not At Risk (04/10/2024)   Humiliation, Afraid, Rape, and Kick questionnaire    Fear of Current or Ex-Partner: No    Emotionally Abused: No    Physically Abused: No    Sexually Abused: No    Review of Systems: Pertinent positive and negative review of systems were noted in the above HPI section.  All other review of systems was otherwise negative.  Physical Exam: Vital signs in last 24 hours: Temp:  [97.8 F (36.6 C)-98.6 F (37 C)] 98.2 F (36.8 C) (04/21 0915) Pulse Rate:  [97-112] 98 (04/21 0915) Resp:  [16-20] 16 (04/21 0915) BP: (92-121)/(58-81) 121/63 (04/21 0915) SpO2:  [95 %-99 %] 96 % (04/21 0915) Weight:  [68 kg] 68 kg (04/20 1649) Last BM Date : 04/10/24 General:   Alert,  Well-developed, well-nourished, very elderly white female pleasant and cooperative in NAD Head:  Normocephalic and atraumatic. Eyes:  Sclera with early icterus   conjunctiva pink. Ears:  Normal auditory acuity. Nose:  No deformity, discharge,  or lesions. Mouth:  No deformity or lesions.   Neck:  Supple; no masses or thyromegaly. Lungs:  Clear throughout to auscultation.   No wheezes, crackles, or rhonchi.  Heart:  Regular rate and rhythm; no murmurs, clicks, rubs,  or gallops. Abdomen:  Soft, there is some mild tenderness in the right upper quadrant and hypogastrium, no rebound, bowel sounds are present no palpable hepatosplenomegaly Rectal: Not done Msk:  Symmetrical without gross deformities. . Pulses:  Normal pulses noted. Extremities:  Without clubbing or edema. Neurologic:  Alert and  oriented x4;  grossly normal neurologically. Skin:  Intact without significant lesions or rashes.. Psych:  Alert and cooperative. Normal mood and affect.  Intake/Output from previous day: 04/20 0701 - 04/21 0700 In: 455.2 [I.V.:257.6; IV Piggyback:197.6] Out: -  Intake/Output this shift: No  intake/output data recorded.  Lab Results: Recent Labs    04/10/24 1710 04/11/24 0612  WBC 13.4* 11.9*  HGB 14.4 13.3  HCT 43.7 39.5  PLT 180 175   BMET Recent Labs    04/10/24 1710  04/11/24 0612  NA 140 141  K 3.6 3.7  CL 108 108  CO2 20* 25  GLUCOSE 135* 97  BUN 15 17  CREATININE 1.31* 1.05*  CALCIUM  8.9 8.6*   LFT Recent Labs    04/10/24 2318 04/11/24 0612  PROT  --  5.3*  ALBUMIN  --  2.4*  AST  --  399*  ALT  --  178*  ALKPHOS  --  182*  BILITOT  --  3.8*  BILIDIR 2.8*  --    PT/INR No results for input(s): "LABPROT", "INR" in the last 72 hours. Hepatitis Panel Recent Labs    04/11/24 0612  HEPBSAG NON REACTIVE  HCVAB NON REACTIVE  HEPAIGM NON REACTIVE  HEPBIGM NON REACTIVE     IMPRESSION:  #83 89 year old white female presenting with 1 day history of nausea and vomiting, and workup in the ER with elevated LFTs/T. bili 4.1,, leukocytosis to 13,000  Ultrasound revealed gallstones, and CBD of 8 mm no definite wall thickening noted on ultrasound  MRI/MRCP today again documents gallstones, there is some associated pericholecystic fluid worrisome for acute cholecystitis, and small common bile duct stones found in the distal common bile duct.  #2 history of prior PE/DVT on Xarelto -currently on hold #3 mild AKI #4  history of atrial fibrillation   PLAN: Okay for full liquid diet today Continue IV Rocephin  and metronidazole  Patient received Xarelto  this a.m., will hold Trend LFTs Please consult surgery  We will plan for ERCP with stone extraction, we may not have any option for tomorrow, more likely Wednesday. GI will follow with you   Amy EsterwoodPA-C  04/11/2024, 10:39 AM

## 2024-04-11 NOTE — Consult Note (Signed)
 WOC Nurse Consult Note: Reason for Consult: buttocks wounds  Wound type: 1.  Deep Tissue Pressure Injury lateral sacrum  2.  Stage 3 Pressure Injury B buttocks  Pressure Injury POA: Yes Measurement: see nursing flowsheet  Wound bed: 1. Purple maroon discoloration lateral sacrum (skin intact)  2.  Stage 3 Pressure Injury bilateral 50% pink dry 50% yellow brown necrotic  Drainage (amount, consistency, odor)  appears largely dry  Periwound:dark discoloration  Dressing procedure/placement/frequency: Cleanse sacrum and buttocks with soap and water, dry and apply Xeroform gauze ([place] [phone]) to area of sacral purple maroon discoloration. Secure with silicone foam.   Cleanse buttocks with soap and water, dry and apply Medihoney to wound bed daily, cover with dry gauze and silicone foam.    POC discussed with bedside nurse. Appreciate M. Lackey assistance with this consult. WOC team will not follow. Re-consult if further needs arise.   Thank you,    Ronni Colace MSN, RN-BC, [employer] [phone]

## 2024-04-11 NOTE — Consult Note (Signed)
 Stacy Valencia 04/24/1933  [id number].    Requesting MD: Dr. Redge Cancel  Chief Complaint/Reason for Consult: choledocholithiasis  HPI:  This is an 89 yo female with a history of A fib, DVT/PE on Xarelto  (LD unclear - she says several days ago), adjustment disorder, GERD, transient hypotension, and frequent falls who resided in a SNF who had been having about 3 days of nausea and vomiting with poor oral intake.  She denies any fevers, CP, SOB, chills, abdominal pain, dysuria, or changes in bowel function. Due to her persistent N/V, she was brought to the ED where she was noted to have elevated LFTs and concern on MRCP for CBD stones.  She has been seen by GI who is waiting on her Xarelto  to wear off and then plans for ERCP.  We have been asked to see her for possible lap chole to follow.  ROS: ROS: see HPI  Family History  Problem Relation Age of Onset   Pulmonary embolism Mother    Other Father        Head trauma   Emphysema Sister    Alcoholism Sister    Down syndrome Brother     Past Medical History:  Diagnosis Date   Adjustment disorder with anxious mood    Atrial fibrillation (HCC) 11/19/2023   DVT (deep venous thrombosis) (HCC) 01/07/2013   GERD (gastroesophageal reflux disease)    PE (pulmonary embolism) 12/23/2003    Past Surgical History:  Procedure Laterality Date   ABDOMINAL HYSTERECTOMY     APPENDECTOMY     arthroscopic shoulder surgery Right 10/28/2016   decompression of SAD/DCR   INTRAMEDULLARY (IM) NAIL INTERTROCHANTERIC Left 07/23/2020   Procedure: INTRAMEDULLARY (IM) NAIL INTERTROCHANTRIC HIP;  Surgeon: Adonica Hoose, MD;  Location: [facility] OR;  Service: Orthopedics;  Laterality: Left;    Social History:  reports that she has never smoked. She has never used smokeless tobacco. She reports that she does not drink alcohol and does not use drugs.  Allergies:  Allergies  Allergen Reactions   Sulfa Antibiotics Nausea And Vomiting    Pt states only in  tablet form    Medications Prior to Admission  Medication Sig Dispense Refill   acetaminophen  (TYLENOL ) 500 MG tablet Take 500 mg by mouth every 6 (six) hours as needed for mild pain (pain score 1-3).     Cholecalciferol 50 MCG (2000 UT) TABS Take by mouth.     metoprolol  tartrate (LOPRESSOR ) 25 MG tablet Take 0.5 tablets (12.5 mg total) by mouth 2 (two) times daily. 60 tablet 0   midodrine  (PROAMATINE ) 5 MG tablet Take 1 tablet (5 mg total) by mouth in the morning and at bedtime. Take 5 mg at 8AM and 5mg  at 2PM 180 tablet 3   omeprazole  (PRILOSEC) 20 MG capsule Take 20 mg by mouth daily.     XARELTO  20 MG TABS tablet TAKE 1 TABLET BY MOUTH EVERY DAY 30 tablet 6     Physical Exam: Blood pressure 121/63, pulse 98, temperature 98.2 F (36.8 C), temperature source Oral, resp. rate 16, height 5\' 5"  (1.651 m), weight 68 kg, SpO2 96%. General: pleasant, WD, WN female who is laying in bed in NAD HEENT: head is normocephalic, atraumatic.  Sclera are non icteric Heart: regular, rate, and rhythm.   Lungs: CTAB, no wheezes, rhonchi, or rales noted.  Respiratory effort nonlabored Abd: Soft, NT, ND, +BS, no masses, hernias, or organomegaly MS: No LE edema Skin: warm and dry  Neuro: Cranial nerves  2-12 grossly intact, MAE's Psych: A&Ox3 with an appropriate affect.  Results for orders placed or performed during the hospital encounter of 04/10/24 (from the past 48 hours)  Comprehensive metabolic panel     Status: Abnormal   Collection Time: 04/10/24  5:10 PM  Result Value Ref Range   Sodium 140 135 - 145 mmol/L   Potassium 3.6 3.5 - 5.1 mmol/L   Chloride 108 98 - 111 mmol/L   CO2 20 (L) 22 - 32 mmol/L   Glucose, Bld 135 (H) 70 - 99 mg/dL    Comment: Glucose reference range applies only to samples taken after fasting for at least 8 hours.   BUN 15 8 - 23 mg/dL   Creatinine, Ser 1.61 (H) 0.44 - 1.00 mg/dL   Calcium  8.9 8.9 - 10.3 mg/dL   Total Protein 5.6 (L) 6.5 - 8.1 g/dL   Albumin 2.7 (L)  3.5 - 5.0 g/dL   AST 096 (H) 15 - 41 U/L   ALT 219 (H) 0 - 44 U/L    Comment: RESULT CONFIRMED BY MANUAL DILUTION   Alkaline Phosphatase 216 (H) 38 - 126 U/L   Total Bilirubin 4.1 (H) 0.0 - 1.2 mg/dL   GFR, Estimated 39 (L) >60 mL/min    Comment: (NOTE) Calculated using the CKD-EPI Creatinine Equation (2021)    Anion gap 12 5 - 15    Comment: Performed at [facility], [address] [place], [place] [address]  Lipase, blood     Status: None   Collection Time: 04/10/24  5:10 PM  Result Value Ref Range   Lipase 21 11 - 51 U/L    Comment: Performed at [facility], [address] [place], [place] [address]  CBC with Diff     Status: Abnormal   Collection Time: 04/10/24  5:10 PM  Result Value Ref Range   WBC 13.4 (H) 4.0 - 10.5 K/uL   RBC 4.09 3.87 - 5.11 MIL/uL   Hemoglobin 14.4 12.0 - 15.0 g/dL   HCT 14.7 82.9 - 56.2 %   MCV 106.8 (H) 80.0 - 100.0 fL   MCH 35.2 (H) 26.0 - 34.0 pg   MCHC 33.0 30.0 - 36.0 g/dL   RDW 13.0 86.5 - 78.4 %   Platelets 180 150 - 400 K/uL   nRBC 0.0 0.0 - 0.2 %   Neutrophils Relative % 86 %   Neutro Abs 11.6 (H) 1.7 - 7.7 K/uL   Lymphocytes Relative 7 %   Lymphs Abs 1.0 0.7 - 4.0 K/uL   Monocytes Relative 5 %   Monocytes Absolute 0.6 0.1 - 1.0 K/uL   Eosinophils Relative 1 %   Eosinophils Absolute 0.1 0.0 - 0.5 K/uL   Basophils Relative 0 %   Basophils Absolute 0.1 0.0 - 0.1 K/uL   Immature Granulocytes 1 %   Abs Immature Granulocytes 0.08 (H) 0.00 - 0.07 K/uL    Comment: Performed at [facility], [address], [place], [place] [address]  Type and screen [facility]     Status: None   Collection Time: 04/10/24  5:10 PM  Result Value Ref Range   ABO/RH(D) O POS    Antibody Screen NEG    Sample Expiration      04/13/2024,2359 Performed at [facility], [address], [place], [place] [address]   Blood culture (routine x 2)     Status: None (Preliminary result)   Collection Time: 04/10/24  8:38  PM    Specimen: BLOOD  Result Value Ref Range   Specimen Description BLOOD BLOOD RIGHT WRIST    Special Requests      BOTTLES DRAWN AEROBIC ONLY Blood Culture results may not be optimal due to an inadequate volume of blood received in culture bottles   Culture      NO GROWTH < 12 HOURS Performed at [facility], [address], [place], [place] [address]    Report Status PENDING   ABO/Rh     Status: None   Collection Time: 04/10/24  8:46 PM  Result Value Ref Range   ABO/RH(D)      O POS Performed at [facility], [address] [place], [place] [address]   Blood culture (routine x 2)     Status: None (Preliminary result)   Collection Time: 04/10/24  8:47 PM   Specimen: BLOOD  Result Value Ref Range   Specimen Description      BLOOD BLOOD LEFT ARM AEROBIC BOTTLE ONLY ANAEROBIC BOTTLE ONLY   Special Requests      BOTTLES DRAWN AEROBIC AND ANAEROBIC Blood Culture adequate volume   Culture      NO GROWTH < 12 HOURS Performed at [facility], [address], [place], [place] [address]    Report Status PENDING   Bilirubin, direct     Status: Abnormal   Collection Time: 04/10/24 11:18 PM  Result Value Ref Range   Bilirubin, Direct 2.8 (H) 0.0 - 0.2 mg/dL    Comment: Performed at [facility], [address], [place], [place] [address]  Acetaminophen  level     Status: Abnormal   Collection Time: 04/10/24 11:18 PM  Result Value Ref Range   Acetaminophen  (Tylenol ), Serum <10 (L) 10 - 30 ug/mL    Comment: (NOTE) Therapeutic concentrations vary significantly. A range of 10-30 ug/mL  may be an effective concentration for many patients. However, some  are best treated at concentrations outside of this range. Acetaminophen  concentrations >150 ug/mL at 4 hours after ingestion  and >50 ug/mL at 12 hours after ingestion are often associated with  toxic reactions.  Performed at [facility], [address] [place], [place] [address]   Comprehensive  metabolic panel     Status: Abnormal   Collection Time: 04/11/24  6:12 AM  Result Value Ref Range   Sodium 141 135 - 145 mmol/L   Potassium 3.7 3.5 - 5.1 mmol/L   Chloride 108 98 - 111 mmol/L   CO2 25 22 - 32 mmol/L   Glucose, Bld 97 70 - 99 mg/dL    Comment: Glucose reference range applies only to samples taken after fasting for at least 8 hours.   BUN 17 8 - 23 mg/dL   Creatinine, Ser 1.30 (H) 0.44 - 1.00 mg/dL   Calcium  8.6 (L) 8.9 - 10.3 mg/dL   Total Protein 5.3 (L) 6.5 - 8.1 g/dL   Albumin 2.4 (L) 3.5 - 5.0 g/dL   AST 865 (H) 15 - 41 U/L   ALT 178 (H) 0 - 44 U/L   Alkaline Phosphatase 182 (H) 38 - 126 U/L   Total Bilirubin 3.8 (H) 0.0 - 1.2 mg/dL   GFR, Estimated 51 (L) >60 mL/min    Comment: (NOTE) Calculated using the CKD-EPI Creatinine Equation (2021)    Anion gap 8 5 - 15    Comment: Performed at [facility], [address], [place], [place] [address]  CBC     Status: Abnormal   Collection Time: 04/11/24  6:12 AM  Result Value Ref Range   WBC 11.9 (H) 4.0 - 10.5 K/uL   RBC 3.76 (L) 3.87 - 5.11 MIL/uL   Hemoglobin 13.3 12.0 - 15.0 g/dL   HCT 96.0 45.4 - 09.8 %   MCV 105.1 (H) 80.0 - 100.0 fL   MCH 35.4 (H) 26.0 - 34.0 pg   MCHC 33.7 30.0 - 36.0 g/dL   RDW 11.9 14.7 - 82.9 %   Platelets 175 150 - 400 K/uL   nRBC 0.0 0.0 - 0.2 %    Comment: Performed at [facility], [address], [place], [place] [address]  Hepatitis panel, acute     Status: None   Collection Time: 04/11/24  6:12 AM  Result Value Ref Range   Hepatitis B Surface Ag NON REACTIVE NON REACTIVE   HCV Ab NON REACTIVE NON REACTIVE    Comment: (NOTE) Nonreactive HCV antibody screen is consistent with no HCV infections,  unless recent infection is suspected or other evidence exists to indicate HCV infection.     Hep A IgM NON REACTIVE NON REACTIVE   Hep B C IgM NON REACTIVE NON REACTIVE    Comment: Performed at [facility], [address], [place], [place] [address]   MR  ABDOMEN MRCP W WO CONTAST Result Date: 04/11/2024 CLINICAL DATA:  Cholelithiasis; Cholelithiasis 92631 Cholecystitis 92631 EXAM: MRI ABDOMEN WITHOUT AND WITH CONTRAST (INCLUDING MRCP) TECHNIQUE: Multiplanar multisequence MR imaging of the abdomen was performed both before and after the administration of intravenous contrast. Heavily T2-weighted images of the biliary and pancreatic ducts were obtained, and three-dimensional MRCP images were rendered by post processing. CONTRAST:  6mL GADAVIST  GADOBUTROL  1 MMOL/ML IV SOLN COMPARISON:  Abdominal ultrasound 04/10/2024. Abdominal CT 11/19/2023. FINDINGS: Technical note: Despite efforts by the technologist and patient, mild motion artifact is present on today's exam and could not be eliminated. This reduces exam sensitivity and specificity. Lower chest: Dependent opacities at both lung bases appear increased from previous CT, possibly due to progressive fibrosis or superimposed atelectasis/inflammation. Hepatobiliary: The liver has a non cirrhotic morphology, without significant steatosis. No focal liver lesions identified. Multiple small gallstones are noted with mild gallbladder distension and mild pericholecystic fluid. No significant intrahepatic biliary dilatation. There is mild extrahepatic biliary dilatation with the common hepatic duct measuring 6 mm in diameter and the common bile duct 10 mm. There are small intraductal stones within the distal common bile duct, best seen on the coronal images. Pancreas: Diffuse fatty replacement as on previous CT. No pancreatic ductal dilatation or surrounding inflammation. Spleen: Normal in size without focal abnormality. Adrenals/Urinary Tract: Both adrenal glands appear normal. No evidence of enhancing renal mass, hydronephrosis or asymmetric perinephric soft tissue stranding. There are simple renal cysts bilaterally for which no specific follow-up imaging is recommended. Stomach/Bowel: The stomach appears unremarkable for  its degree of distension. No evidence of bowel wall thickening, distention or surrounding inflammatory change. Vascular/Lymphatic: Stable prominent lymph nodes in the porta hepatis, likely reactive. No acute vascular findings. There is mild aortoiliac tortuosity. Other: No significant ascites or focal extraluminal fluid collection. Musculoskeletal: Multiple lumbar compression deformities are grossly unchanged from previous CT. IMPRESSION: 1. Mild extrahepatic biliary dilatation with small intraductal stones in the distal common bile duct. 2. Cholelithiasis with mild gallbladder distension and mild pericholecystic fluid, suspicious for early acute cholecystitis, not demonstrated on ultrasound yesterday. Correlate clinically. 3. No other acute abdominal findings.  4. Dependent opacities at both lung bases appear increased from previous CT, possibly due to progressive fibrosis or superimposed atelectasis/inflammation. Recommend chest radiographic correlation. Electronically Signed   By: Elmon Hagedorn M.D.   On: 04/11/2024 11:19   MR 3D Recon At Scanner Result Date: 04/11/2024 CLINICAL DATA:  Cholelithiasis; Cholelithiasis 92631 Cholecystitis 92631 EXAM: MRI ABDOMEN WITHOUT AND WITH CONTRAST (INCLUDING MRCP) TECHNIQUE: Multiplanar multisequence MR imaging of the abdomen was performed both before and after the administration of intravenous contrast. Heavily T2-weighted images of the biliary and pancreatic ducts were obtained, and three-dimensional MRCP images were rendered by post processing. CONTRAST:  6mL GADAVIST  GADOBUTROL  1 MMOL/ML IV SOLN COMPARISON:  Abdominal ultrasound 04/10/2024. Abdominal CT 11/19/2023. FINDINGS: Technical note: Despite efforts by the technologist and patient, mild motion artifact is present on today's exam and could not be eliminated. This reduces exam sensitivity and specificity. Lower chest: Dependent opacities at both lung bases appear increased from previous CT, possibly due to  progressive fibrosis or superimposed atelectasis/inflammation. Hepatobiliary: The liver has a non cirrhotic morphology, without significant steatosis. No focal liver lesions identified. Multiple small gallstones are noted with mild gallbladder distension and mild pericholecystic fluid. No significant intrahepatic biliary dilatation. There is mild extrahepatic biliary dilatation with the common hepatic duct measuring 6 mm in diameter and the common bile duct 10 mm. There are small intraductal stones within the distal common bile duct, best seen on the coronal images. Pancreas: Diffuse fatty replacement as on previous CT. No pancreatic ductal dilatation or surrounding inflammation. Spleen: Normal in size without focal abnormality. Adrenals/Urinary Tract: Both adrenal glands appear normal. No evidence of enhancing renal mass, hydronephrosis or asymmetric perinephric soft tissue stranding. There are simple renal cysts bilaterally for which no specific follow-up imaging is recommended. Stomach/Bowel: The stomach appears unremarkable for its degree of distension. No evidence of bowel wall thickening, distention or surrounding inflammatory change. Vascular/Lymphatic: Stable prominent lymph nodes in the porta hepatis, likely reactive. No acute vascular findings. There is mild aortoiliac tortuosity. Other: No significant ascites or focal extraluminal fluid collection. Musculoskeletal: Multiple lumbar compression deformities are grossly unchanged from previous CT. IMPRESSION: 1. Mild extrahepatic biliary dilatation with small intraductal stones in the distal common bile duct. 2. Cholelithiasis with mild gallbladder distension and mild pericholecystic fluid, suspicious for early acute cholecystitis, not demonstrated on ultrasound yesterday. Correlate clinically. 3. No other acute abdominal findings. 4. Dependent opacities at both lung bases appear increased from previous CT, possibly due to progressive fibrosis or superimposed  atelectasis/inflammation. Recommend chest radiographic correlation. Electronically Signed   By: Elmon Hagedorn M.D.   On: 04/11/2024 11:19   US  Abdomen Limited RUQ (LIVER/GB) Result Date: 04/10/2024 CLINICAL DATA:  Abdominal pain EXAM: ULTRASOUND ABDOMEN LIMITED RIGHT UPPER QUADRANT COMPARISON:  CT abdomen pelvis 11/19/2023 FINDINGS: Gallbladder: Cholelithiasis. No wall thickening or pericholecystic fluid. Negative sonographic Murphy's sign. Common bile duct: Diameter: 8 mm. Liver: No focal lesion identified. Increased parenchymal echogenicity. Portal vein is patent on color Doppler imaging with normal direction of blood flow towards the liver. Other: None. IMPRESSION: 1. Cholelithiasis without evidence of acute cholecystitis. 2. Hepatic steatosis. Electronically Signed   By: Rozell Cornet M.D.   On: 04/10/2024 19:57   DG Abd Acute W/Chest Result Date: 04/10/2024 CLINICAL DATA:  Vomiting EXAM: DG ABDOMEN ACUTE WITH 1 VIEW CHEST COMPARISON:  11/19/2023 FINDINGS: Heart size and mediastinal contours are within normal limits. Lungs are clear. No effusion. No free air. Normal bowel gas pattern. There are no abnormal calcifications. Spondylitic changes in the  lumbar spine. Left femoral neck fixation hardware partially visualized. IMPRESSION: No acute findings. Electronically Signed   By: Nicoletta Barrier M.D.   On: 04/10/2024 18:27   Anti-infectives (From admission, onward)    Start     Dose/Rate Route Frequency Ordered Stop   04/11/24 1000  cefTRIAXone  (ROCEPHIN ) 2 g in sodium chloride  0.9 % 100 mL IVPB        2 g 200 mL/hr over 30 Minutes Intravenous Every 24 hours 04/10/24 2208 04/18/24 0959   04/10/24 2230  metroNIDAZOLE  (FLAGYL ) IVPB 500 mg        500 mg 100 mL/hr over 60 Minutes Intravenous Every 12 hours 04/10/24 2208 04/17/24 2159   04/10/24 2015  cefTRIAXone  (ROCEPHIN ) 1 g in sodium chloride  0.9 % 100 mL IVPB        1 g 200 mL/hr over 30 Minutes Intravenous  Once 04/10/24 2005 04/10/24 2232          Assessment/Plan Choledocholithiasis The patient has been seen, examined, labs, vitals, imaging, and chart reviewed.  She appears to have choledocholithiasis and is being followed by GI for ERCP once her Xarelto  wears off.  Her MRCP questions some mild pericholecystic fluid and possible early cholecystitis can't be rule out; however, she is non-tender on exam. Would ask primary team to eval if she needs any pre-operative testing before consideration of general anesthesia. Will need to discuss w/ pt and family risks of recurrence with avoiding surgery vs risk of cholecystectomy with her pmhx and advanced age. We will continue to follow with you for now.  FEN - FLD VTE - Xarelto  on hold ID - Rocephin /Flagyl   A fib H/O DVT/PE GERD Transient hypotension Adjustment disorder Frequent falls  I reviewed nursing notes, Consultant (GI) notes, hospitalist notes, last 24 h vitals and pain scores, last 48 h intake and output, last 24 h labs and trends, and last 24 h imaging results.  Alferd Igo [facility] 04/11/2024, 3:04 PM Please see Amion for pager number during day hours 7:00am-4:30pm or 7:00am -11:30am on weekends

## 2024-04-11 NOTE — Consult Note (Signed)
 WOC team received consult for buttocks excoriations/wounds. Secure chat sent to primary nurse requesting photo documentation be placed in chart.   Please note that the [facility] nursing team is utilizing a standardized work plan to manage patient consults. We are triaging consults and will try to see the patients within 48 hours. Wound photos in the patient's chart allow us  to consult on the patient in the most efficient and timely manner.    Thank you ,   Ronni Colace MSN, RN-BC, [employer] [phone]

## 2024-04-11 NOTE — Evaluation (Signed)
 Occupational Therapy Evaluation Patient Details Name: Stacy Valencia MRN: [id number] DOB: Feb 05, 1933 Today's Date: 04/11/2024   History of Present Illness   Pt is 89 year old presented to [facility] on  04/10/24 for N/V. Pt with cholelithiasis. PMH - afib, UTI, falls, osteoporosis, PE, GERD, L hip IM nail 2021.     Clinical Impressions Prior to this admission, patient living at home with her spouse, using a rolling walker, has a lift chair, and husband assisted with all ADLs. Currently, patient frustrated with all attempts to engage with patient, but willing to participate. Patient min-mod A for bed mobility, and min A to come into standing with RW, however declining further attempts at mobility despite encouragement. Patient mod A for ADL management, but self limiting when attempted. OT recommending HHOT at this time, however if patient demonstrates further decline or spouse cannot provide appropriate assist, patient may need a stint at a lesser intensive facility < 3 hours in order to return home safely.      If plan is discharge home, recommend the following:   A little help with walking and/or transfers;A lot of help with bathing/dressing/bathroom;Direct supervision/assist for medications management;Direct supervision/assist for financial management;Assist for transportation;Help with stairs or ramp for entrance;Supervision due to cognitive status     Functional Status Assessment   Patient has had a recent decline in their functional status and demonstrates the ability to make significant improvements in function in a reasonable and predictable amount of time.     Equipment Recommendations   None recommended by OT     Recommendations for Other Services         Precautions/Restrictions   Precautions Precautions: Fall Restrictions Weight Bearing Restrictions Per Provider Order: No     Mobility Bed Mobility Overal bed mobility: Needs Assistance Bed Mobility: Supine to  Sit, Sit to Supine     Supine to sit: Mod assist, HOB elevated Sit to supine: Min assist   General bed mobility comments: Assist to bring legs off of bed, elevate trunk into sitting and scoot to EOB crying with pressure placed at IV site in L hand. Assist to bring feet back up into bed.    Transfers Overall transfer level: Needs assistance Equipment used: Rolling walker (2 wheels) Transfers: Sit to/from Stand, Bed to chair/wheelchair/BSC Sit to Stand: Min assist           General transfer comment: Assist to power up and stablize, declining sitting on BSC once in standing, stating she was too tired despite encouragement      Balance Overall balance assessment: Needs assistance Sitting-balance support: No upper extremity supported, Feet supported Sitting balance-Leahy Scale: Fair     Standing balance support: Bilateral upper extremity supported, During functional activity, Reliant on assistive device for balance Standing balance-Leahy Scale: Poor Standing balance comment: walker and CGA for static standing                           ADL either performed or assessed with clinical judgement   ADL Overall ADL's : Needs assistance/impaired Eating/Feeding: Set up;Sitting   Grooming: Set up;Sitting   Upper Body Bathing: Minimal assistance;Sitting   Lower Body Bathing: Moderate assistance;Maximal assistance;Sit to/from stand;Sitting/lateral leans   Upper Body Dressing : Minimal assistance;Sitting   Lower Body Dressing: Moderate assistance;Maximal assistance;Sit to/from stand;Sitting/lateral leans   Toilet Transfer: Minimal assistance;Stand-pivot;Cueing for sequencing;Cueing for safety;BSC/3in1;Rolling walker (2 wheels) Toilet Transfer Details (indicate cue type and reason): refusing to complete stand pivot  after getting on her feet Toileting- Clothing Manipulation and Hygiene: Total assistance;Sit to/from stand Toileting - Clothing Manipulation Details (indicate  cue type and reason): unable to complete peri-care in standing     Functional mobility during ADLs: Moderate assistance;Cueing for sequencing;Cueing for safety;Rolling walker (2 wheels) General ADL Comments: Prior to this admission, patient living at home with her spouse, using a rolling walker, had a lift chair, and husband assisted with all ADLs. Currently, patient frustrated with all attempts to engage with patient, but willing to participate. Patient min-mod A for bed mobility, and min A to come into standing with RW, however declining further attempts at mobility despite encouragement. Patient mod A for ADL management, but self limiting when attempted. OT recommending HHOT at this time, however if patient demonstrates further decline or spouse cannot provide appropriate assist, patient may need a stint at a lesser intensive facility < 3 hours in order to return home safely.     Vision Baseline Vision/History: 1 Wears glasses Ability to See in Adequate Light: 0 Adequate Patient Visual Report: No change from baseline Vision Assessment?: Wears glasses for reading     Perception Perception: Not tested       Praxis Praxis: Not tested       Pertinent Vitals/Pain Pain Assessment Pain Assessment: Faces Faces Pain Scale: Hurts a little bit Pain Location: lt wrist with pressure on hand due to location of IV Pain Descriptors / Indicators: Grimacing, Guarding Pain Intervention(s): Limited activity within patient's tolerance, Monitored during session, Repositioned     Extremity/Trunk Assessment Upper Extremity Assessment Upper Extremity Assessment: Right hand dominant;Generalized weakness;RUE deficits/detail RUE Deficits / Details: Right trigger finger, decreased grip noted   Lower Extremity Assessment Lower Extremity Assessment: Generalized weakness   Cervical / Trunk Assessment Cervical / Trunk Assessment: Kyphotic   Communication Communication Communication: Impaired Factors  Affecting Communication: Hearing impaired   Cognition Arousal: Alert Behavior During Therapy: WFL for tasks assessed/performed Cognition: Cognition impaired   Orientation impairments: Time, Situation Awareness: Intellectual awareness impaired, Online awareness impaired Memory impairment (select all impairments): Short-term memory Attention impairment (select first level of impairment): Sustained attention Executive functioning impairment (select all impairments): Sequencing, Problem solving OT - Cognition Comments: Patient flat and agitated throughout session, minimally disoriented, but willing to participate                 Following commands: Intact       Cueing  General Comments   Cueing Techniques: Verbal cues;Tactile cues      Exercises     Shoulder Instructions      Home Living Family/patient expects to be discharged to:: Private residence Living Arrangements: Spouse/significant other Available Help at Discharge: Family;Available 24 hours/day Type of Home: House Home Access: Ramped entrance     Home Layout: One level     Bathroom Shower/Tub: Producer, television/film/video: Handicapped height Bathroom Accessibility: Yes   Home Equipment: Agricultural consultant (2 wheels);Wheelchair - manual;Shower seat;BSC/3in1;Grab bars - tub/shower;Hand held shower head;Lift chair          Prior Functioning/Environment Prior Level of Function : Needs assist;History of Falls (last six months)             Mobility Comments: Uses rolling walker and lift chair. Husband helps with standing ADLs Comments: husband helps with all ADLs, bathing, transfers    OT Problem List: Decreased strength;Impaired balance (sitting and/or standing);Decreased activity tolerance;Decreased range of motion;Decreased coordination;Decreased cognition;Decreased safety awareness;Decreased knowledge of use of DME or AE;Pain  OT Treatment/Interventions: Self-care/ADL training;Therapeutic  exercise;Energy conservation;DME and/or AE instruction;Manual therapy;Therapeutic activities;Cognitive remediation/compensation;Patient/family education;Balance training      OT Goals(Current goals can be found in the care plan section)   Acute Rehab OT Goals Patient Stated Goal: to be left alone OT Goal Formulation: Patient unable to participate in goal setting Time For Goal Achievement: 04/25/24 Potential to Achieve Goals: Fair   OT Frequency:  Min 2X/week    Co-evaluation              AM-PAC OT "6 Clicks" Daily Activity     Outcome Measure Help from another person eating meals?: A Little Help from another person taking care of personal grooming?: A Little Help from another person toileting, which includes using toliet, bedpan, or urinal?: A Lot Help from another person bathing (including washing, rinsing, drying)?: A Lot Help from another person to put on and taking off regular upper body clothing?: A Little Help from another person to put on and taking off regular lower body clothing?: A Lot 6 Click Score: 15   End of Session Equipment Utilized During Treatment: Gait belt;Rolling walker (2 wheels) Nurse Communication: Mobility status;Other (comment) (Patient agitated but calm in bed at the moment)  Activity Tolerance: Treatment limited secondary to agitation Patient left: in bed;with call bell/phone within reach;with bed alarm set  OT Visit Diagnosis: Unsteadiness on feet (R26.81);Other abnormalities of gait and mobility (R26.89);Repeated falls (R29.6);Muscle weakness (generalized) (M62.81);History of falling (Z91.81);Other symptoms and signs involving cognitive function;Adult, failure to thrive (R62.7);Pain                Time: 0102-7253 OT Time Calculation (min): 22 min Charges:  OT General Charges $OT Visit: 1 Visit OT Evaluation $OT Eval Moderate Complexity: 1 Mod  Mollie Anger E. Helaina Stefano, OTR/L Acute Rehabilitation Services [phone]   Vincent Greek 04/11/2024, 3:39 PM

## 2024-04-11 NOTE — Progress Notes (Signed)
 PROGRESS NOTE                                                                                                                                                                                                             Patient Demographics:    Stacy Valencia, is a 89 y.o. female, DOB - [birth year], ZOX:[id number]  Outpatient Primary MD for the patient is Renetta Carter, MD (Inactive)    LOS - 1  Admit date - 04/10/2024    Chief Complaint  Patient presents with   Emesis       Brief Narrative (HPI from H&P)   Stacy Valencia is a 89 y.o. female with medical history significant of past medical history of paroxysmal atrial fibrillation, history of PE and DVT on chronic anticoagulation, adjustment disorder with anxiety, gastroesophageal reflux disease, transient hypotension with history of falls came to hospital with complaint of nausea and vomiting x 3 days   Patient stated that for the past 3 days she has been having intermittent episode of nausea and vomiting, associated with poor oral intake.  Patient denied having abdominal pain.  She described vomiting as brown color, without any blood content.  She reported no melena. She denies having any fever or chills.  Due to symptoms she came to ED for evaluation.   ED Course:  In the ED she was afebrile with temperature 98.6 F.. Blood pressure was stable 120/81, with tachycardia 112/min.  No hypoxia, 97% on room air. CBC revealed leukocytosis 13.4, no anemia. Chemistry showed elevation of creatinine 1.3 with mild nongap metabolic acidosis CO2 20. LFTs were elevated with AST 628, ALT 219 and total bilirubin 4.1, alkaline phosphatase 216 Abdominal ultrasound showing cholelithiasis without acute cholecystitis and hepatic steatosis.   Patient was treated empirically with IV antibiotics ceftriaxone  and Flagyl , IV fluids and will be admitted to hospital for further evaluation and  treatment   Subjective:    Stacy Valencia was evaluated at the bed side. Reports feeling better today.  Denies any nausea, abdominal pain or vomiting but has not had anything to eat.   Assessment  & Plan :    Assessment and Plan: # Choledocholithiasis  # Early cholecystitis - Denies any abdominal pain, nausea and vomiting today, wants to try to eat - MRCP shows mild extrahepatic biliary dilatation with small intraductal stones in  the distal common bile duct as well as findings concerning for early acute cholecystitis. - White count and LFTs trending down - GI following, planning ERCP pending Xarelto  washout, recommends surgery consult - General Surgery consulted, appreciate recs - Continue IV Rocephin  and Flagyl  - Pain control with as needed Norco and IV morphine  - Trend CBC, LFTs and fever curve  # Acute kidney injury, resolved -Trend renal function   # Paroxysmal atrial fibrillation - Remains in A-fib with HR in the 80s to 90s - Continue metoprolol  for rate control. - Hold Xarelto  for pending ERCP   # History of PE and DVT  -Continue to hold Xarelto  as above   # Adjustment disorder with anxiety - Stable   # GERD - Continue pantoprazole    # Transient hypotension with history of falls - BP stable with SBP in the 90s to 120s - Continue midodrine .  PT evaluation        Condition -stable  Family Communication  : No family at bedside  Code Status : Full  Consults  : GI, general surgery  PUD Prophylaxis : Protonix    Procedures  :     None      Disposition Plan  :    Status is: Inpatient Remains inpatient appropriate because: Choledocholithiasis, acute cholecystitis  DVT Prophylaxis  :    rivaroxaban  (XARELTO ) tablet 20 mg Start: 04/11/24 1000 rivaroxaban  (XARELTO ) tablet 20 mg     Lab Results  Component Value Date   PLT 175 04/11/2024    Diet :  Diet Order             Diet NPO time specified Except for: Sips with Meds  Diet effective now                     Inpatient Medications  Scheduled Meds:  leptospermum manuka honey  1 Application Topical Daily   metoprolol  tartrate  12.5 mg Oral BID   midodrine   5 mg Oral BID WC   pantoprazole   40 mg Oral Daily   rivaroxaban   20 mg Oral Daily   Continuous Infusions:  cefTRIAXone  (ROCEPHIN )  IV 2 g (04/11/24 0916)   lactated ringers  75 mL/hr at 04/10/24 2333   metronidazole  500 mg (04/11/24 0919)   PRN Meds:.acetaminophen  **OR** acetaminophen , albuterol , HYDROcodone -acetaminophen , morphine  injection, ondansetron  **OR** ondansetron  (ZOFRAN ) IV, polyethylene glycol  Antibiotics  :    Anti-infectives (From admission, onward)    Start     Dose/Rate Route Frequency Ordered Stop   04/11/24 1000  cefTRIAXone  (ROCEPHIN ) 2 g in sodium chloride  0.9 % 100 mL IVPB        2 g 200 mL/hr over 30 Minutes Intravenous Every 24 hours 04/10/24 2208 04/18/24 0959   04/10/24 2230  metroNIDAZOLE  (FLAGYL ) IVPB 500 mg        500 mg 100 mL/hr over 60 Minutes Intravenous Every 12 hours 04/10/24 2208 04/17/24 2159   04/10/24 2015  cefTRIAXone  (ROCEPHIN ) 1 g in sodium chloride  0.9 % 100 mL IVPB        1 g 200 mL/hr over 30 Minutes Intravenous  Once 04/10/24 2005 04/10/24 2232         Objective:   Vitals:   04/10/24 2326 04/11/24 0127 04/11/24 0504 04/11/24 0915  BP: 103/65 (!) 92/58 99/60 121/63  Pulse: (!) 105 (!) 104 97 98  Resp: 19 19 20 16   Temp: 98.4 F (36.9 C) 97.9 F (36.6 C) 97.8 F (36.6 C) 98.2 F (36.8 C)  TempSrc: Oral Axillary Axillary Oral  SpO2: 95% 95% 95% 96%  Weight:      Height:        Wt Readings from Last 3 Encounters:  04/10/24 68 kg  03/21/24 68 kg  12/25/23 68 kg     Intake/Output Summary (Last 24 hours) at 04/11/2024 0944 Last data filed at 04/11/2024 0400 Gross per 24 hour  Intake 455.2 ml  Output --  Net 455.2 ml     Physical Exam  General: Pleasant, well-appearing elderly woman laying in bed. No acute distress. HEENT: [place]/AT. Anicteric  sclera CV: RRR. No murmurs, rubs, or gallops. No LE edema Pulmonary: Lungs CTAB. Normal effort. No wheezing or rales. Abdominal: Soft, NT/ND. Normal bowel sounds. Extremities: Palpable radial and DP pulses. Normal ROM. Skin: Warm and dry. No obvious rash or lesions. Neuro: A&Ox3. Moves all extremities. Normal sensation to light touch. No focal deficit. Psych: Normal mood and affect    RN pressure injury documentation:      Data Review:    Recent Labs  Lab 04/10/24 1710 04/11/24 0612  WBC 13.4* 11.9*  HGB 14.4 13.3  HCT 43.7 39.5  PLT 180 175  MCV 106.8* 105.1*  MCH 35.2* 35.4*  MCHC 33.0 33.7  RDW 15.1 15.4  LYMPHSABS 1.0  --   MONOABS 0.6  --   EOSABS 0.1  --   BASOSABS 0.1  --     Recent Labs  Lab 04/10/24 1710 04/11/24 0612  NA 140 141  K 3.6 3.7  CL 108 108  CO2 20* 25  ANIONGAP 12 8  GLUCOSE 135* 97  BUN 15 17  CREATININE 1.31* 1.05*  AST 628* 399*  ALT 219* 178*  ALKPHOS 216* 182*  BILITOT 4.1* 3.8*  ALBUMIN 2.7* 2.4*  CALCIUM  8.9 8.6*      Recent Labs  Lab 04/10/24 1710 04/11/24 0612  CALCIUM  8.9 8.6*    --------------------------------------------------------------------------------------------------------------- No results found for: "CHOL", "HDL", "LDLCALC", "LDLDIRECT", "TRIG", "CHOLHDL"  Lab Results  Component Value Date   HGBA1C 5.5 07/23/2020   No results for input(s): "TSH", "T4TOTAL", "FREET4", "T3FREE", "THYROIDAB" in the last 72 hours. No results for input(s): "VITAMINB12", "FOLATE", "FERRITIN", "TIBC", "IRON", "RETICCTPCT" in the last 72 hours. ------------------------------------------------------------------------------------------------------------------ Cardiac Enzymes No results for input(s): "CKMB", "TROPONINI", "MYOGLOBIN" in the last 168 hours.  Invalid input(s): "CK"  Micro Results Recent Results (from the past 240 hours)  Blood culture (routine x 2)     Status: None (Preliminary result)   Collection Time:  04/10/24  8:38 PM   Specimen: BLOOD  Result Value Ref Range Status   Specimen Description BLOOD BLOOD RIGHT WRIST  Final   Special Requests   Final    BOTTLES DRAWN AEROBIC ONLY Blood Culture results may not be optimal due to an inadequate volume of blood received in culture bottles   Culture   Final    NO GROWTH < 12 HOURS Performed at [facility], [address] [place], [place] [address]    Report Status PENDING  Incomplete  Blood culture (routine x 2)     Status: None (Preliminary result)   Collection Time: 04/10/24  8:47 PM   Specimen: BLOOD  Result Value Ref Range Status   Specimen Description   Final    BLOOD BLOOD LEFT ARM AEROBIC BOTTLE ONLY ANAEROBIC BOTTLE ONLY   Special Requests   Final    BOTTLES DRAWN AEROBIC AND ANAEROBIC Blood Culture adequate volume   Culture   Final  NO GROWTH < 12 HOURS Performed at [facility], [address], [place], [place] [address]    Report Status PENDING  Incomplete    Radiology Reports US  Abdomen Limited RUQ (LIVER/GB) Result Date: 04/10/2024 CLINICAL DATA:  Abdominal pain EXAM: ULTRASOUND ABDOMEN LIMITED RIGHT UPPER QUADRANT COMPARISON:  CT abdomen pelvis 11/19/2023 FINDINGS: Gallbladder: Cholelithiasis. No wall thickening or pericholecystic fluid. Negative sonographic Murphy's sign. Common bile duct: Diameter: 8 mm. Liver: No focal lesion identified. Increased parenchymal echogenicity. Portal vein is patent on color Doppler imaging with normal direction of blood flow towards the liver. Other: None. IMPRESSION: 1. Cholelithiasis without evidence of acute cholecystitis. 2. Hepatic steatosis. Electronically Signed   By: Rozell Cornet M.D.   On: 04/10/2024 19:57   DG Abd Acute W/Chest Result Date: 04/10/2024 CLINICAL DATA:  Vomiting EXAM: DG ABDOMEN ACUTE WITH 1 VIEW CHEST COMPARISON:  11/19/2023 FINDINGS: Heart size and mediastinal contours are within normal limits. Lungs are clear. No effusion. No free air. Normal bowel  gas pattern. There are no abnormal calcifications. Spondylitic changes in the lumbar spine. Left femoral neck fixation hardware partially visualized. IMPRESSION: No acute findings. Electronically Signed   By: Nicoletta Barrier M.D.   On: 04/10/2024 18:27      Signature  -   Vita Grip M.D on 04/11/2024 at 9:44 AM   -  To page go to [email or web address]

## 2024-04-11 NOTE — Plan of Care (Signed)
 Pt sitting up in bed. Doing a word book activity. No complaints voiced. Ivf's infusing w/o difficulty. Encouraged to call for assitance as needed.call light in reach.. sr x3 elevated. Bed in low position.

## 2024-04-12 DIAGNOSIS — K8050 Calculus of bile duct without cholangitis or cholecystitis without obstruction: Secondary | ICD-10-CM | POA: Diagnosis not present

## 2024-04-12 DIAGNOSIS — R7989 Other specified abnormal findings of blood chemistry: Secondary | ICD-10-CM | POA: Diagnosis not present

## 2024-04-12 DIAGNOSIS — E876 Hypokalemia: Secondary | ICD-10-CM

## 2024-04-12 DIAGNOSIS — R112 Nausea with vomiting, unspecified: Secondary | ICD-10-CM | POA: Diagnosis not present

## 2024-04-12 DIAGNOSIS — K8020 Calculus of gallbladder without cholecystitis without obstruction: Secondary | ICD-10-CM | POA: Diagnosis not present

## 2024-04-12 DIAGNOSIS — D72829 Elevated white blood cell count, unspecified: Secondary | ICD-10-CM | POA: Diagnosis not present

## 2024-04-12 LAB — CBC WITH DIFFERENTIAL/PLATELET
Abs Immature Granulocytes: 0.05 10*3/uL (ref 0.00–0.07)
Basophils Absolute: 0 10*3/uL (ref 0.0–0.1)
Basophils Relative: 1 %
Eosinophils Absolute: 0.2 10*3/uL (ref 0.0–0.5)
Eosinophils Relative: 3 %
HCT: 37.6 % (ref 36.0–46.0)
Hemoglobin: 12.6 g/dL (ref 12.0–15.0)
Immature Granulocytes: 1 %
Lymphocytes Relative: 30 %
Lymphs Abs: 2.2 10*3/uL (ref 0.7–4.0)
MCH: 35.1 pg — ABNORMAL HIGH (ref 26.0–34.0)
MCHC: 33.5 g/dL (ref 30.0–36.0)
MCV: 104.7 fL — ABNORMAL HIGH (ref 80.0–100.0)
Monocytes Absolute: 0.4 10*3/uL (ref 0.1–1.0)
Monocytes Relative: 5 %
Neutro Abs: 4.5 10*3/uL (ref 1.7–7.7)
Neutrophils Relative %: 60 %
Platelets: 159 10*3/uL (ref 150–400)
RBC: 3.59 MIL/uL — ABNORMAL LOW (ref 3.87–5.11)
RDW: 15.4 % (ref 11.5–15.5)
WBC: 7.4 10*3/uL (ref 4.0–10.5)
nRBC: 0 % (ref 0.0–0.2)

## 2024-04-12 LAB — COMPREHENSIVE METABOLIC PANEL WITH GFR
ALT: 130 U/L — ABNORMAL HIGH (ref 0–44)
AST: 237 U/L — ABNORMAL HIGH (ref 15–41)
Albumin: 2.3 g/dL — ABNORMAL LOW (ref 3.5–5.0)
Alkaline Phosphatase: 168 U/L — ABNORMAL HIGH (ref 38–126)
Anion gap: 6 (ref 5–15)
BUN: 15 mg/dL (ref 8–23)
CO2: 23 mmol/L (ref 22–32)
Calcium: 8.5 mg/dL — ABNORMAL LOW (ref 8.9–10.3)
Chloride: 109 mmol/L (ref 98–111)
Creatinine, Ser: 0.82 mg/dL (ref 0.44–1.00)
GFR, Estimated: >60 mL/min (ref >60)
Glucose, Bld: 89 mg/dL (ref 70–99)
Potassium: 3.3 mmol/L — ABNORMAL LOW (ref 3.5–5.1)
Sodium: 138 mmol/L (ref 135–145)
Total Bilirubin: 1.7 mg/dL — ABNORMAL HIGH (ref 0.0–1.2)
Total Protein: 5.2 g/dL — ABNORMAL LOW (ref 6.5–8.1)

## 2024-04-12 MED ORDER — CALCIUM CARBONATE ANTACID 500 MG PO CHEW
1.0000 | CHEWABLE_TABLET | Freq: Two times a day (BID) | ORAL | Status: DC | PRN
  Administered 2024-04-12: 200 mg via ORAL
  Filled 2024-04-12: qty 1

## 2024-04-12 MED ORDER — POTASSIUM CHLORIDE CRYS ER 20 MEQ PO TBCR
40.0000 meq | EXTENDED_RELEASE_TABLET | Freq: Once | ORAL | Status: AC
  Administered 2024-04-12: 40 meq via ORAL
  Filled 2024-04-12: qty 2

## 2024-04-12 NOTE — Progress Notes (Signed)
 PROGRESS NOTE                                                                                                                                                                                                             Patient Demographics:    Jesika Men, is a 89 y.o. female, DOB - [birth year], QMV:[id number]  Outpatient Primary MD for the patient is Renetta Carter, MD (Inactive)    LOS - 2  Admit date - 04/10/2024    Chief Complaint  Patient presents with   Emesis       Brief Narrative (HPI from H&P)   SINDY MCCUNE is a 89 y.o. female with medical history significant of past medical history of paroxysmal atrial fibrillation, history of PE and DVT on chronic anticoagulation, adjustment disorder with anxiety, gastroesophageal reflux disease, transient hypotension with history of falls came to hospital with complaint of nausea and vomiting x 3 days   Patient stated that for the past 3 days she has been having intermittent episode of nausea and vomiting, associated with poor oral intake.  Patient denied having abdominal pain.  She described vomiting as brown color, without any blood content.  She reported no melena. She denies having any fever or chills.  Due to symptoms she came to ED for evaluation.   ED Course:  In the ED she was afebrile with temperature 98.6 F.. Blood pressure was stable 120/81, with tachycardia 112/min.  No hypoxia, 97% on room air. CBC revealed leukocytosis 13.4, no anemia. Chemistry showed elevation of creatinine 1.3 with mild nongap metabolic acidosis CO2 20. LFTs were elevated with AST 628, ALT 219 and total bilirubin 4.1, alkaline phosphatase 216 Abdominal ultrasound showing cholelithiasis without acute cholecystitis and hepatic steatosis.   Patient was treated empirically with IV antibiotics ceftriaxone  and Flagyl , IV fluids and will be admitted to hospital for further evaluation and  treatment   Subjective:    Makyna Niehoff was evaluated at the bed side.  She reports not feeling great this morning due to some back pain from laying in bed.  She continues to deny abdominal pain, nausea or vomiting.   Assessment  & Plan :   Assessment and Plan: # Choledocholithiasis  # Early cholecystitis - Continues to deny abdominal pain, nausea or vomiting.  Tolerated full liquid diet. - MRCP shows mild extrahepatic  biliary dilatation with small intraductal stones in the distal common bile duct as well as findings concerning for early acute cholecystitis. - Leukocytosis resolved, LFTs continues to trend down - GI following, planning to do ERCP tomorrow - General Surgery following, appreciate recs - Continue IV Rocephin  and Flagyl  - Pain control with as needed Norco and IV morphine  - Trend CBC, LFTs and fever curve  # Hypokalemia - K+ of 3.3. Repleted with KCl 40 mEq x 1. - Follow-up morning BMP  # Paroxysmal atrial fibrillation - Remains in A-fib with HR in the 80s to 90s - Continue metoprolol  for rate control. - Hold Xarelto  for pending ERCP   # History of PE and DVT  -Continue to hold Xarelto  as above   # Adjustment disorder with anxiety - Stable   # GERD - Continue pantoprazole    # Transient hypotension with history of falls - BP stable with SBP in the 100s to 120s - Continue midodrine . - PT following        Condition -stable  Family Communication  : No family at bedside  Code Status : Full  Consults  : GI, general surgery  PUD Prophylaxis : Protonix    Procedures  :     None      Disposition Plan  :    Status is: Inpatient Remains inpatient appropriate because: Choledocholithiasis, acute cholecystitis  DVT Prophylaxis  :         Lab Results  Component Value Date   PLT 159 04/12/2024    Diet :  Diet Order             Diet NPO time specified Except for: Sips with Meds  Diet effective midnight           Diet full liquid Room  service appropriate? Yes; Fluid consistency: Thin  Diet effective now                    Inpatient Medications  Scheduled Meds:  leptospermum manuka honey  1 Application Topical Daily   metoprolol  tartrate  12.5 mg Oral BID   midodrine   5 mg Oral BID WC   pantoprazole   40 mg Oral Daily   potassium chloride   40 mEq Oral Once   Continuous Infusions:  cefTRIAXone  (ROCEPHIN )  IV 2 g (04/11/24 0916)   metronidazole  500 mg (04/11/24 2126)   PRN Meds:.acetaminophen  **OR** acetaminophen , albuterol , HYDROcodone -acetaminophen , morphine  injection, ondansetron  **OR** ondansetron  (ZOFRAN ) IV, polyethylene glycol  Antibiotics  :    Anti-infectives (From admission, onward)    Start     Dose/Rate Route Frequency Ordered Stop   04/11/24 1000  cefTRIAXone  (ROCEPHIN ) 2 g in sodium chloride  0.9 % 100 mL IVPB        2 g 200 mL/hr over 30 Minutes Intravenous Every 24 hours 04/10/24 2208 04/18/24 0959   04/10/24 2230  metroNIDAZOLE  (FLAGYL ) IVPB 500 mg        500 mg 100 mL/hr over 60 Minutes Intravenous Every 12 hours 04/10/24 2208 04/17/24 2159   04/10/24 2015  cefTRIAXone  (ROCEPHIN ) 1 g in sodium chloride  0.9 % 100 mL IVPB        1 g 200 mL/hr over 30 Minutes Intravenous  Once 04/10/24 2005 04/10/24 2232         Objective:   Vitals:   04/11/24 0915 04/11/24 1618 04/11/24 2019 04/12/24 0437  BP: 121/63 116/63 127/61 106/62  Pulse: 98 95 83 60  Resp: 16 16 16 16   Temp: 98.2 F (36.8  C) 98.6 F (37 C) 98.8 F (37.1 C) 98.4 F (36.9 C)  TempSrc: Oral Oral Oral   SpO2: 96% 93% 96% 93%  Weight:      Height:        Wt Readings from Last 3 Encounters:  04/10/24 68 kg  03/21/24 68 kg  12/25/23 68 kg     Intake/Output Summary (Last 24 hours) at 04/12/2024 0919 Last data filed at 04/11/2024 1831 Gross per 24 hour  Intake 354 ml  Output --  Net 354 ml     Physical Exam  General: Pleasant, well-appearing elderly woman laying in bed. No acute distress. HEENT: [place]/AT.  Anicteric sclera CV: Regular rate. Irregularly irregular rhythm. No murmurs, rubs, or gallops. No LE edema Pulmonary: Lungs CTAB. Normal effort. No wheezing or rales. Abdominal: Soft, NT/ND. Normal bowel sounds. Extremities: Palpable radial and DP pulses. Normal ROM. Skin: Warm and dry. No obvious rash or lesions. Neuro: A&Ox3. Moves all extremities. Normal sensation to light touch. No focal deficit.    RN pressure injury documentation:      Data Review:    Recent Labs  Lab 04/10/24 1710 04/11/24 0612 04/12/24 0704  WBC 13.4* 11.9* 7.4  HGB 14.4 13.3 12.6  HCT 43.7 39.5 37.6  PLT 180 175 159  MCV 106.8* 105.1* 104.7*  MCH 35.2* 35.4* 35.1*  MCHC 33.0 33.7 33.5  RDW 15.1 15.4 15.4  LYMPHSABS 1.0  --  2.2  MONOABS 0.6  --  0.4  EOSABS 0.1  --  0.2  BASOSABS 0.1  --  0.0    Recent Labs  Lab 04/10/24 1710 04/11/24 0612 04/12/24 0704  NA 140 141 138  K 3.6 3.7 3.3*  CL 108 108 109  CO2 20* 25 23  ANIONGAP 12 8 6   GLUCOSE 135* 97 89  BUN 15 17 15   CREATININE 1.31* 1.05* 0.82  AST 628* 399* 237*  ALT 219* 178* 130*  ALKPHOS 216* 182* 168*  BILITOT 4.1* 3.8* 1.7*  ALBUMIN 2.7* 2.4* 2.3*  CALCIUM  8.9 8.6* 8.5*      Recent Labs  Lab 04/10/24 1710 04/11/24 0612 04/12/24 0704  CALCIUM  8.9 8.6* 8.5*    --------------------------------------------------------------------------------------------------------------- No results found for: "CHOL", "HDL", "LDLCALC", "LDLDIRECT", "TRIG", "CHOLHDL"  Lab Results  Component Value Date   HGBA1C 5.5 07/23/2020   No results for input(s): "TSH", "T4TOTAL", "FREET4", "T3FREE", "THYROIDAB" in the last 72 hours. No results for input(s): "VITAMINB12", "FOLATE", "FERRITIN", "TIBC", "IRON", "RETICCTPCT" in the last 72 hours. ------------------------------------------------------------------------------------------------------------------ Cardiac Enzymes No results for input(s): "CKMB", "TROPONINI", "MYOGLOBIN" in the last 168  hours.  Invalid input(s): "CK"  Micro Results Recent Results (from the past 240 hours)  Blood culture (routine x 2)     Status: None (Preliminary result)   Collection Time: 04/10/24  8:38 PM   Specimen: BLOOD  Result Value Ref Range Status   Specimen Description BLOOD BLOOD RIGHT WRIST  Final   Special Requests   Final    BOTTLES DRAWN AEROBIC ONLY Blood Culture results may not be optimal due to an inadequate volume of blood received in culture bottles   Culture   Final    NO GROWTH < 12 HOURS Performed at [facility], [address], [place], [place] [address]    Report Status PENDING  Incomplete  Blood culture (routine x 2)     Status: None (Preliminary result)   Collection Time: 04/10/24  8:47 PM   Specimen: BLOOD  Result Value Ref Range Status  Specimen Description   Final    BLOOD BLOOD LEFT ARM AEROBIC BOTTLE ONLY ANAEROBIC BOTTLE ONLY   Special Requests   Final    BOTTLES DRAWN AEROBIC AND ANAEROBIC Blood Culture adequate volume   Culture   Final    NO GROWTH < 12 HOURS Performed at [facility], [address], [place], [place] [address]    Report Status PENDING  Incomplete    Radiology Reports MR ABDOMEN MRCP W WO CONTAST Result Date: 04/11/2024 CLINICAL DATA:  Cholelithiasis; Cholelithiasis 92631 Cholecystitis 92631 EXAM: MRI ABDOMEN WITHOUT AND WITH CONTRAST (INCLUDING MRCP) TECHNIQUE: Multiplanar multisequence MR imaging of the abdomen was performed both before and after the administration of intravenous contrast. Heavily T2-weighted images of the biliary and pancreatic ducts were obtained, and three-dimensional MRCP images were rendered by post processing. CONTRAST:  6mL GADAVIST  GADOBUTROL  1 MMOL/ML IV SOLN COMPARISON:  Abdominal ultrasound 04/10/2024. Abdominal CT 11/19/2023. FINDINGS: Technical note: Despite efforts by the technologist and patient, mild motion artifact is present on today's exam and could not be eliminated. This reduces exam  sensitivity and specificity. Lower chest: Dependent opacities at both lung bases appear increased from previous CT, possibly due to progressive fibrosis or superimposed atelectasis/inflammation. Hepatobiliary: The liver has a non cirrhotic morphology, without significant steatosis. No focal liver lesions identified. Multiple small gallstones are noted with mild gallbladder distension and mild pericholecystic fluid. No significant intrahepatic biliary dilatation. There is mild extrahepatic biliary dilatation with the common hepatic duct measuring 6 mm in diameter and the common bile duct 10 mm. There are small intraductal stones within the distal common bile duct, best seen on the coronal images. Pancreas: Diffuse fatty replacement as on previous CT. No pancreatic ductal dilatation or surrounding inflammation. Spleen: Normal in size without focal abnormality. Adrenals/Urinary Tract: Both adrenal glands appear normal. No evidence of enhancing renal mass, hydronephrosis or asymmetric perinephric soft tissue stranding. There are simple renal cysts bilaterally for which no specific follow-up imaging is recommended. Stomach/Bowel: The stomach appears unremarkable for its degree of distension. No evidence of bowel wall thickening, distention or surrounding inflammatory change. Vascular/Lymphatic: Stable prominent lymph nodes in the porta hepatis, likely reactive. No acute vascular findings. There is mild aortoiliac tortuosity. Other: No significant ascites or focal extraluminal fluid collection. Musculoskeletal: Multiple lumbar compression deformities are grossly unchanged from previous CT. IMPRESSION: 1. Mild extrahepatic biliary dilatation with small intraductal stones in the distal common bile duct. 2. Cholelithiasis with mild gallbladder distension and mild pericholecystic fluid, suspicious for early acute cholecystitis, not demonstrated on ultrasound yesterday. Correlate clinically. 3. No other acute abdominal  findings. 4. Dependent opacities at both lung bases appear increased from previous CT, possibly due to progressive fibrosis or superimposed atelectasis/inflammation. Recommend chest radiographic correlation. Electronically Signed   By: Elmon Hagedorn M.D.   On: 04/11/2024 11:19   MR 3D Recon At Scanner Result Date: 04/11/2024 CLINICAL DATA:  Cholelithiasis; Cholelithiasis 92631 Cholecystitis 92631 EXAM: MRI ABDOMEN WITHOUT AND WITH CONTRAST (INCLUDING MRCP) TECHNIQUE: Multiplanar multisequence MR imaging of the abdomen was performed both before and after the administration of intravenous contrast. Heavily T2-weighted images of the biliary and pancreatic ducts were obtained, and three-dimensional MRCP images were rendered by post processing. CONTRAST:  6mL GADAVIST  GADOBUTROL  1 MMOL/ML IV SOLN COMPARISON:  Abdominal ultrasound 04/10/2024. Abdominal CT 11/19/2023. FINDINGS: Technical note: Despite efforts by the technologist and patient, mild motion artifact is present on today's exam and could not be eliminated. This reduces exam sensitivity and specificity. Lower chest: Dependent opacities  at both lung bases appear increased from previous CT, possibly due to progressive fibrosis or superimposed atelectasis/inflammation. Hepatobiliary: The liver has a non cirrhotic morphology, without significant steatosis. No focal liver lesions identified. Multiple small gallstones are noted with mild gallbladder distension and mild pericholecystic fluid. No significant intrahepatic biliary dilatation. There is mild extrahepatic biliary dilatation with the common hepatic duct measuring 6 mm in diameter and the common bile duct 10 mm. There are small intraductal stones within the distal common bile duct, best seen on the coronal images. Pancreas: Diffuse fatty replacement as on previous CT. No pancreatic ductal dilatation or surrounding inflammation. Spleen: Normal in size without focal abnormality. Adrenals/Urinary Tract: Both  adrenal glands appear normal. No evidence of enhancing renal mass, hydronephrosis or asymmetric perinephric soft tissue stranding. There are simple renal cysts bilaterally for which no specific follow-up imaging is recommended. Stomach/Bowel: The stomach appears unremarkable for its degree of distension. No evidence of bowel wall thickening, distention or surrounding inflammatory change. Vascular/Lymphatic: Stable prominent lymph nodes in the porta hepatis, likely reactive. No acute vascular findings. There is mild aortoiliac tortuosity. Other: No significant ascites or focal extraluminal fluid collection. Musculoskeletal: Multiple lumbar compression deformities are grossly unchanged from previous CT. IMPRESSION: 1. Mild extrahepatic biliary dilatation with small intraductal stones in the distal common bile duct. 2. Cholelithiasis with mild gallbladder distension and mild pericholecystic fluid, suspicious for early acute cholecystitis, not demonstrated on ultrasound yesterday. Correlate clinically. 3. No other acute abdominal findings. 4. Dependent opacities at both lung bases appear increased from previous CT, possibly due to progressive fibrosis or superimposed atelectasis/inflammation. Recommend chest radiographic correlation. Electronically Signed   By: Elmon Hagedorn M.D.   On: 04/11/2024 11:19   US  Abdomen Limited RUQ (LIVER/GB) Result Date: 04/10/2024 CLINICAL DATA:  Abdominal pain EXAM: ULTRASOUND ABDOMEN LIMITED RIGHT UPPER QUADRANT COMPARISON:  CT abdomen pelvis 11/19/2023 FINDINGS: Gallbladder: Cholelithiasis. No wall thickening or pericholecystic fluid. Negative sonographic Murphy's sign. Common bile duct: Diameter: 8 mm. Liver: No focal lesion identified. Increased parenchymal echogenicity. Portal vein is patent on color Doppler imaging with normal direction of blood flow towards the liver. Other: None. IMPRESSION: 1. Cholelithiasis without evidence of acute cholecystitis. 2. Hepatic steatosis.  Electronically Signed   By: Rozell Cornet M.D.   On: 04/10/2024 19:57   DG Abd Acute W/Chest Result Date: 04/10/2024 CLINICAL DATA:  Vomiting EXAM: DG ABDOMEN ACUTE WITH 1 VIEW CHEST COMPARISON:  11/19/2023 FINDINGS: Heart size and mediastinal contours are within normal limits. Lungs are clear. No effusion. No free air. Normal bowel gas pattern. There are no abnormal calcifications. Spondylitic changes in the lumbar spine. Left femoral neck fixation hardware partially visualized. IMPRESSION: No acute findings. Electronically Signed   By: Nicoletta Barrier M.D.   On: 04/10/2024 18:27      Signature  -   Vita Grip M.D on 04/12/2024 at 9:19 AM   -  To page go to [email or web address]

## 2024-04-12 NOTE — Progress Notes (Signed)
 Patient in bed watching TV, bed on lowest position, call light placed in reach.

## 2024-04-12 NOTE — Progress Notes (Signed)
 [facility] night cross cover note:   I was notified by RN that this patient is complaining of indigestion, and requests her home prn Tums, which is typically helpful in addressing her indigestion.  I subsequently placed order for prn Tums for indigestion.   Camelia Cavalier, DO Hospitalist

## 2024-04-12 NOTE — H&P (View-Only) (Signed)
 Patient ID: FEDERICA ALLPORT, female   DOB: 16-Oct-1933, 89 y.o.   MRN: [id number]    Progress Note   Subjective   Day # 2 CC; acute nausea, vomiting abdominal discomfort.  Xarelto  on hold IV metronidazole /Rocephin   Patient says she is not having any abdominal pain today, has been able to tolerate liquids, complaining of back pain ever since she went in the MRI machine, okay as long as she can lay down.  Labs reviewed, stable Potassium 3.3 T. bili 1.7/alk phos 168/AST 237/ALT 130   Objective   Vital signs in last 24 hours: Temp:  [98.4 F (36.9 C)-98.8 F (37.1 C)] 98.4 F (36.9 C) (04/22 0437) Pulse Rate:  [60-95] 60 (04/22 0437) Resp:  [16] 16 (04/22 0437) BP: (106-127)/(61-63) 106/62 (04/22 0437) SpO2:  [93 %-96 %] 93 % (04/22 0437) Last BM Date : 04/10/24 General:    Elderly white female in NAD Heart:  Regular rate and rhythm; no murmurs Lungs: Respirations even and unlabored, lungs CTA bilaterally Abdomen:  Soft, nontender and nondistended. Normal bowel sounds. Extremities:  Without edema. Neurologic:  Alert and oriented,  grossly normal neurologically. Psych:  Cooperative. Normal mood and affect.  Intake/Output from previous day: 04/21 0701 - 04/22 0700 In: 354 [P.O.:354] Out: -  Intake/Output this shift: No intake/output data recorded.  Lab Results: Recent Labs    04/10/24 1710 04/11/24 0612 04/12/24 0704  WBC 13.4* 11.9* 7.4  HGB 14.4 13.3 12.6  HCT 43.7 39.5 37.6  PLT 180 175 159   BMET Recent Labs    04/10/24 1710 04/11/24 0612 04/12/24 0704  NA 140 141 138  K 3.6 3.7 3.3*  CL 108 108 109  CO2 20* 25 23  GLUCOSE 135* 97 89  BUN 15 17 15   CREATININE 1.31* 1.05* 0.82  CALCIUM  8.9 8.6* 8.5*   LFT Recent Labs    04/10/24 2318 04/11/24 0612 04/12/24 0704  PROT  --    < > 5.2*  ALBUMIN  --    < > 2.3*  AST  --    < > 237*  ALT  --    < > 130*  ALKPHOS  --    < > 168*  BILITOT  --    < > 1.7*  BILIDIR 2.8*  --   --    < > = values in  this interval not displayed.   PT/INR No results for input(s): "LABPROT", "INR" in the last 72 hours.  Studies/Results: MR ABDOMEN MRCP W WO CONTAST Result Date: 04/11/2024 CLINICAL DATA:  Cholelithiasis; Cholelithiasis 92631 Cholecystitis 92631 EXAM: MRI ABDOMEN WITHOUT AND WITH CONTRAST (INCLUDING MRCP) TECHNIQUE: Multiplanar multisequence MR imaging of the abdomen was performed both before and after the administration of intravenous contrast. Heavily T2-weighted images of the biliary and pancreatic ducts were obtained, and three-dimensional MRCP images were rendered by post processing. CONTRAST:  6mL GADAVIST  GADOBUTROL  1 MMOL/ML IV SOLN COMPARISON:  Abdominal ultrasound 04/10/2024. Abdominal CT 11/19/2023. FINDINGS: Technical note: Despite efforts by the technologist and patient, mild motion artifact is present on today's exam and could not be eliminated. This reduces exam sensitivity and specificity. Lower chest: Dependent opacities at both lung bases appear increased from previous CT, possibly due to progressive fibrosis or superimposed atelectasis/inflammation. Hepatobiliary: The liver has a non cirrhotic morphology, without significant steatosis. No focal liver lesions identified. Multiple small gallstones are noted with mild gallbladder distension and mild pericholecystic fluid. No significant intrahepatic biliary dilatation. There is mild extrahepatic biliary dilatation with the common  hepatic duct measuring 6 mm in diameter and the common bile duct 10 mm. There are small intraductal stones within the distal common bile duct, best seen on the coronal images. Pancreas: Diffuse fatty replacement as on previous CT. No pancreatic ductal dilatation or surrounding inflammation. Spleen: Normal in size without focal abnormality. Adrenals/Urinary Tract: Both adrenal glands appear normal. No evidence of enhancing renal mass, hydronephrosis or asymmetric perinephric soft tissue stranding. There are simple  renal cysts bilaterally for which no specific follow-up imaging is recommended. Stomach/Bowel: The stomach appears unremarkable for its degree of distension. No evidence of bowel wall thickening, distention or surrounding inflammatory change. Vascular/Lymphatic: Stable prominent lymph nodes in the porta hepatis, likely reactive. No acute vascular findings. There is mild aortoiliac tortuosity. Other: No significant ascites or focal extraluminal fluid collection. Musculoskeletal: Multiple lumbar compression deformities are grossly unchanged from previous CT. IMPRESSION: 1. Mild extrahepatic biliary dilatation with small intraductal stones in the distal common bile duct. 2. Cholelithiasis with mild gallbladder distension and mild pericholecystic fluid, suspicious for early acute cholecystitis, not demonstrated on ultrasound yesterday. Correlate clinically. 3. No other acute abdominal findings. 4. Dependent opacities at both lung bases appear increased from previous CT, possibly due to progressive fibrosis or superimposed atelectasis/inflammation. Recommend chest radiographic correlation. Electronically Signed   By: Elmon Hagedorn M.D.   On: 04/11/2024 11:19   MR 3D Recon At Scanner Result Date: 04/11/2024 CLINICAL DATA:  Cholelithiasis; Cholelithiasis 92631 Cholecystitis 92631 EXAM: MRI ABDOMEN WITHOUT AND WITH CONTRAST (INCLUDING MRCP) TECHNIQUE: Multiplanar multisequence MR imaging of the abdomen was performed both before and after the administration of intravenous contrast. Heavily T2-weighted images of the biliary and pancreatic ducts were obtained, and three-dimensional MRCP images were rendered by post processing. CONTRAST:  6mL GADAVIST  GADOBUTROL  1 MMOL/ML IV SOLN COMPARISON:  Abdominal ultrasound 04/10/2024. Abdominal CT 11/19/2023. FINDINGS: Technical note: Despite efforts by the technologist and patient, mild motion artifact is present on today's exam and could not be eliminated. This reduces exam  sensitivity and specificity. Lower chest: Dependent opacities at both lung bases appear increased from previous CT, possibly due to progressive fibrosis or superimposed atelectasis/inflammation. Hepatobiliary: The liver has a non cirrhotic morphology, without significant steatosis. No focal liver lesions identified. Multiple small gallstones are noted with mild gallbladder distension and mild pericholecystic fluid. No significant intrahepatic biliary dilatation. There is mild extrahepatic biliary dilatation with the common hepatic duct measuring 6 mm in diameter and the common bile duct 10 mm. There are small intraductal stones within the distal common bile duct, best seen on the coronal images. Pancreas: Diffuse fatty replacement as on previous CT. No pancreatic ductal dilatation or surrounding inflammation. Spleen: Normal in size without focal abnormality. Adrenals/Urinary Tract: Both adrenal glands appear normal. No evidence of enhancing renal mass, hydronephrosis or asymmetric perinephric soft tissue stranding. There are simple renal cysts bilaterally for which no specific follow-up imaging is recommended. Stomach/Bowel: The stomach appears unremarkable for its degree of distension. No evidence of bowel wall thickening, distention or surrounding inflammatory change. Vascular/Lymphatic: Stable prominent lymph nodes in the porta hepatis, likely reactive. No acute vascular findings. There is mild aortoiliac tortuosity. Other: No significant ascites or focal extraluminal fluid collection. Musculoskeletal: Multiple lumbar compression deformities are grossly unchanged from previous CT. IMPRESSION: 1. Mild extrahepatic biliary dilatation with small intraductal stones in the distal common bile duct. 2. Cholelithiasis with mild gallbladder distension and mild pericholecystic fluid, suspicious for early acute cholecystitis, not demonstrated on ultrasound yesterday. Correlate clinically. 3. No other acute abdominal  findings. 4. Dependent opacities at both lung bases appear increased from previous CT, possibly due to progressive fibrosis or superimposed atelectasis/inflammation. Recommend chest radiographic correlation. Electronically Signed   By: Elmon Hagedorn M.D.   On: 04/11/2024 11:19   US  Abdomen Limited RUQ (LIVER/GB) Result Date: 04/10/2024 CLINICAL DATA:  Abdominal pain EXAM: ULTRASOUND ABDOMEN LIMITED RIGHT UPPER QUADRANT COMPARISON:  CT abdomen pelvis 11/19/2023 FINDINGS: Gallbladder: Cholelithiasis. No wall thickening or pericholecystic fluid. Negative sonographic Murphy's sign. Common bile duct: Diameter: 8 mm. Liver: No focal lesion identified. Increased parenchymal echogenicity. Portal vein is patent on color Doppler imaging with normal direction of blood flow towards the liver. Other: None. IMPRESSION: 1. Cholelithiasis without evidence of acute cholecystitis. 2. Hepatic steatosis. Electronically Signed   By: Rozell Cornet M.D.   On: 04/10/2024 19:57   DG Abd Acute W/Chest Result Date: 04/10/2024 CLINICAL DATA:  Vomiting EXAM: DG ABDOMEN ACUTE WITH 1 VIEW CHEST COMPARISON:  11/19/2023 FINDINGS: Heart size and mediastinal contours are within normal limits. Lungs are clear. No effusion. No free air. Normal bowel gas pattern. There are no abnormal calcifications. Spondylitic changes in the lumbar spine. Left femoral neck fixation hardware partially visualized. IMPRESSION: No acute findings. Electronically Signed   By: Nicoletta Barrier M.D.   On: 04/10/2024 18:27       Assessment / Plan:    #75 89 year old white female admitted with 1 day history of nausea and vomiting.  Found to have elevated LFTs and leukocytosis on admission  Ultrasound positive for gallstones and dilated CBD to 8 mm  /MRCP documents cholelithiasis, some associated pericholecystic fluid worrisome for acute cholecystitis and small common bile duct stones in the distal common bile duct  Patient is on IV antibiotics Comfortable today  with no abdominal pain  #2 chronic anticoagulation-Xarelto , on hold since admit #3 atrial fibrillation #4 prior history of PE/DVT #5 mild AKI improved #6 hypokalemia continue to replace  Plan; full liquids today, n.p.o. after midnight IV antibiotics/metronidazole  and Rocephin  Patient is scheduled for ERCP with Dr. Venice Gillis tomorrow at 11:30 AM. Surgery is following, will make decision regarding cholecystectomy post ERCP.     Principal Problem:   Choledocholithiasis Active Problems:   Pulmonary embolism (HCC)   GERD (gastroesophageal reflux disease)   DVT (deep venous thrombosis) (HCC)   AKI (acute kidney injury) (HCC)   Adjustment disorder with anxious mood   Atrial fibrillation (HCC)   Acute cholecystitis   Hypokalemia     LOS: 2 days   Amy EsterwoodPA-C  04/12/2024, 4:10 PM   Attending physician's note   I personally saw the patient and performed a substantive portion of this encounter (>50% time spent), including a complete performance of at least one of the key components (MDM, Hx and/or Exam), in conjunction with the APP.  I agree with the APP's note, impression, and recommendations with additional input as follows.     ERCP tomorrow with Dr. Venice Gillis Xarelto  on hold Continue antibiotics N.p.o. after midnight    K. Veena Galvin Aversa , MD [phone]

## 2024-04-12 NOTE — Progress Notes (Addendum)
 Patient ID: FEDERICA ALLPORT, female   DOB: 16-Oct-1933, 89 y.o.   MRN: [id number]    Progress Note   Subjective   Day # 2 CC; acute nausea, vomiting abdominal discomfort.  Xarelto  on hold IV metronidazole /Rocephin   Patient says she is not having any abdominal pain today, has been able to tolerate liquids, complaining of back pain ever since she went in the MRI machine, okay as long as she can lay down.  Labs reviewed, stable Potassium 3.3 T. bili 1.7/alk phos 168/AST 237/ALT 130   Objective   Vital signs in last 24 hours: Temp:  [98.4 F (36.9 C)-98.8 F (37.1 C)] 98.4 F (36.9 C) (04/22 0437) Pulse Rate:  [60-95] 60 (04/22 0437) Resp:  [16] 16 (04/22 0437) BP: (106-127)/(61-63) 106/62 (04/22 0437) SpO2:  [93 %-96 %] 93 % (04/22 0437) Last BM Date : 04/10/24 General:    Elderly white female in NAD Heart:  Regular rate and rhythm; no murmurs Lungs: Respirations even and unlabored, lungs CTA bilaterally Abdomen:  Soft, nontender and nondistended. Normal bowel sounds. Extremities:  Without edema. Neurologic:  Alert and oriented,  grossly normal neurologically. Psych:  Cooperative. Normal mood and affect.  Intake/Output from previous day: 04/21 0701 - 04/22 0700 In: 354 [P.O.:354] Out: -  Intake/Output this shift: No intake/output data recorded.  Lab Results: Recent Labs    04/10/24 1710 04/11/24 0612 04/12/24 0704  WBC 13.4* 11.9* 7.4  HGB 14.4 13.3 12.6  HCT 43.7 39.5 37.6  PLT 180 175 159   BMET Recent Labs    04/10/24 1710 04/11/24 0612 04/12/24 0704  NA 140 141 138  K 3.6 3.7 3.3*  CL 108 108 109  CO2 20* 25 23  GLUCOSE 135* 97 89  BUN 15 17 15   CREATININE 1.31* 1.05* 0.82  CALCIUM  8.9 8.6* 8.5*   LFT Recent Labs    04/10/24 2318 04/11/24 0612 04/12/24 0704  PROT  --    < > 5.2*  ALBUMIN  --    < > 2.3*  AST  --    < > 237*  ALT  --    < > 130*  ALKPHOS  --    < > 168*  BILITOT  --    < > 1.7*  BILIDIR 2.8*  --   --    < > = values in  this interval not displayed.   PT/INR No results for input(s): "LABPROT", "INR" in the last 72 hours.  Studies/Results: MR ABDOMEN MRCP W WO CONTAST Result Date: 04/11/2024 CLINICAL DATA:  Cholelithiasis; Cholelithiasis 92631 Cholecystitis 92631 EXAM: MRI ABDOMEN WITHOUT AND WITH CONTRAST (INCLUDING MRCP) TECHNIQUE: Multiplanar multisequence MR imaging of the abdomen was performed both before and after the administration of intravenous contrast. Heavily T2-weighted images of the biliary and pancreatic ducts were obtained, and three-dimensional MRCP images were rendered by post processing. CONTRAST:  6mL GADAVIST  GADOBUTROL  1 MMOL/ML IV SOLN COMPARISON:  Abdominal ultrasound 04/10/2024. Abdominal CT 11/19/2023. FINDINGS: Technical note: Despite efforts by the technologist and patient, mild motion artifact is present on today's exam and could not be eliminated. This reduces exam sensitivity and specificity. Lower chest: Dependent opacities at both lung bases appear increased from previous CT, possibly due to progressive fibrosis or superimposed atelectasis/inflammation. Hepatobiliary: The liver has a non cirrhotic morphology, without significant steatosis. No focal liver lesions identified. Multiple small gallstones are noted with mild gallbladder distension and mild pericholecystic fluid. No significant intrahepatic biliary dilatation. There is mild extrahepatic biliary dilatation with the common  hepatic duct measuring 6 mm in diameter and the common bile duct 10 mm. There are small intraductal stones within the distal common bile duct, best seen on the coronal images. Pancreas: Diffuse fatty replacement as on previous CT. No pancreatic ductal dilatation or surrounding inflammation. Spleen: Normal in size without focal abnormality. Adrenals/Urinary Tract: Both adrenal glands appear normal. No evidence of enhancing renal mass, hydronephrosis or asymmetric perinephric soft tissue stranding. There are simple  renal cysts bilaterally for which no specific follow-up imaging is recommended. Stomach/Bowel: The stomach appears unremarkable for its degree of distension. No evidence of bowel wall thickening, distention or surrounding inflammatory change. Vascular/Lymphatic: Stable prominent lymph nodes in the porta hepatis, likely reactive. No acute vascular findings. There is mild aortoiliac tortuosity. Other: No significant ascites or focal extraluminal fluid collection. Musculoskeletal: Multiple lumbar compression deformities are grossly unchanged from previous CT. IMPRESSION: 1. Mild extrahepatic biliary dilatation with small intraductal stones in the distal common bile duct. 2. Cholelithiasis with mild gallbladder distension and mild pericholecystic fluid, suspicious for early acute cholecystitis, not demonstrated on ultrasound yesterday. Correlate clinically. 3. No other acute abdominal findings. 4. Dependent opacities at both lung bases appear increased from previous CT, possibly due to progressive fibrosis or superimposed atelectasis/inflammation. Recommend chest radiographic correlation. Electronically Signed   By: Elmon Hagedorn M.D.   On: 04/11/2024 11:19   MR 3D Recon At Scanner Result Date: 04/11/2024 CLINICAL DATA:  Cholelithiasis; Cholelithiasis 92631 Cholecystitis 92631 EXAM: MRI ABDOMEN WITHOUT AND WITH CONTRAST (INCLUDING MRCP) TECHNIQUE: Multiplanar multisequence MR imaging of the abdomen was performed both before and after the administration of intravenous contrast. Heavily T2-weighted images of the biliary and pancreatic ducts were obtained, and three-dimensional MRCP images were rendered by post processing. CONTRAST:  6mL GADAVIST  GADOBUTROL  1 MMOL/ML IV SOLN COMPARISON:  Abdominal ultrasound 04/10/2024. Abdominal CT 11/19/2023. FINDINGS: Technical note: Despite efforts by the technologist and patient, mild motion artifact is present on today's exam and could not be eliminated. This reduces exam  sensitivity and specificity. Lower chest: Dependent opacities at both lung bases appear increased from previous CT, possibly due to progressive fibrosis or superimposed atelectasis/inflammation. Hepatobiliary: The liver has a non cirrhotic morphology, without significant steatosis. No focal liver lesions identified. Multiple small gallstones are noted with mild gallbladder distension and mild pericholecystic fluid. No significant intrahepatic biliary dilatation. There is mild extrahepatic biliary dilatation with the common hepatic duct measuring 6 mm in diameter and the common bile duct 10 mm. There are small intraductal stones within the distal common bile duct, best seen on the coronal images. Pancreas: Diffuse fatty replacement as on previous CT. No pancreatic ductal dilatation or surrounding inflammation. Spleen: Normal in size without focal abnormality. Adrenals/Urinary Tract: Both adrenal glands appear normal. No evidence of enhancing renal mass, hydronephrosis or asymmetric perinephric soft tissue stranding. There are simple renal cysts bilaterally for which no specific follow-up imaging is recommended. Stomach/Bowel: The stomach appears unremarkable for its degree of distension. No evidence of bowel wall thickening, distention or surrounding inflammatory change. Vascular/Lymphatic: Stable prominent lymph nodes in the porta hepatis, likely reactive. No acute vascular findings. There is mild aortoiliac tortuosity. Other: No significant ascites or focal extraluminal fluid collection. Musculoskeletal: Multiple lumbar compression deformities are grossly unchanged from previous CT. IMPRESSION: 1. Mild extrahepatic biliary dilatation with small intraductal stones in the distal common bile duct. 2. Cholelithiasis with mild gallbladder distension and mild pericholecystic fluid, suspicious for early acute cholecystitis, not demonstrated on ultrasound yesterday. Correlate clinically. 3. No other acute abdominal  findings. 4. Dependent opacities at both lung bases appear increased from previous CT, possibly due to progressive fibrosis or superimposed atelectasis/inflammation. Recommend chest radiographic correlation. Electronically Signed   By: Elmon Hagedorn M.D.   On: 04/11/2024 11:19   US  Abdomen Limited RUQ (LIVER/GB) Result Date: 04/10/2024 CLINICAL DATA:  Abdominal pain EXAM: ULTRASOUND ABDOMEN LIMITED RIGHT UPPER QUADRANT COMPARISON:  CT abdomen pelvis 11/19/2023 FINDINGS: Gallbladder: Cholelithiasis. No wall thickening or pericholecystic fluid. Negative sonographic Murphy's sign. Common bile duct: Diameter: 8 mm. Liver: No focal lesion identified. Increased parenchymal echogenicity. Portal vein is patent on color Doppler imaging with normal direction of blood flow towards the liver. Other: None. IMPRESSION: 1. Cholelithiasis without evidence of acute cholecystitis. 2. Hepatic steatosis. Electronically Signed   By: Rozell Cornet M.D.   On: 04/10/2024 19:57   DG Abd Acute W/Chest Result Date: 04/10/2024 CLINICAL DATA:  Vomiting EXAM: DG ABDOMEN ACUTE WITH 1 VIEW CHEST COMPARISON:  11/19/2023 FINDINGS: Heart size and mediastinal contours are within normal limits. Lungs are clear. No effusion. No free air. Normal bowel gas pattern. There are no abnormal calcifications. Spondylitic changes in the lumbar spine. Left femoral neck fixation hardware partially visualized. IMPRESSION: No acute findings. Electronically Signed   By: Nicoletta Barrier M.D.   On: 04/10/2024 18:27       Assessment / Plan:    #30 89 year old white female admitted with 1 day history of nausea and vomiting.  Found to have elevated LFTs and leukocytosis on admission  Ultrasound positive for gallstones and dilated CBD to 8 mm  /MRCP documents cholelithiasis, some associated pericholecystic fluid worrisome for acute cholecystitis and small common bile duct stones in the distal common bile duct  Patient is on IV antibiotics Comfortable today  with no abdominal pain  #2 chronic anticoagulation-Xarelto , on hold since admit #3 atrial fibrillation #4 prior history of PE/DVT #5 mild AKI improved #6 hypokalemia continue to replace  Plan; full liquids today, n.p.o. after midnight IV antibiotics/metronidazole  and Rocephin  Patient is scheduled for ERCP with Dr. Venice Gillis tomorrow at 11:30 AM. Surgery is following, will make decision regarding cholecystectomy post ERCP.     Principal Problem:   Choledocholithiasis Active Problems:   Pulmonary embolism (HCC)   GERD (gastroesophageal reflux disease)   DVT (deep venous thrombosis) (HCC)   AKI (acute kidney injury) (HCC)   Adjustment disorder with anxious mood   Atrial fibrillation (HCC)   Acute cholecystitis   Hypokalemia     LOS: 2 days   Amy EsterwoodPA-C  04/12/2024, 4:10 PM   Attending physician's note   I personally saw the patient and performed a substantive portion of this encounter (>50% time spent), including a complete performance of at least one of the key components (MDM, Hx and/or Exam), in conjunction with the APP.  I agree with the APP's note, impression, and recommendations with additional input as follows.     ERCP tomorrow with Dr. Venice Gillis Xarelto  on hold Continue antibiotics N.p.o. after midnight    K. Veena Jenya Putz , MD [phone]

## 2024-04-12 NOTE — Progress Notes (Signed)
 Physical Therapy Treatment Patient Details Name: Stacy Valencia MRN: [id number] DOB: 05-Feb-1933 Today's Date: 04/12/2024   History of Present Illness Pt is 89 year old presented to [facility] on  04/10/24 for N/V. Pt with cholelithiasis. PMH - afib, UTI, falls, osteoporosis, PE, GERD, L hip IM nail 2021.    PT Comments  Pt seen for PT tx with pt agreeable with encouragement/education. Pt declining OOB/walking but agreeable to sit EOB. Pt requires min assist for supine>sit, supervision for sit>supine, mod<>max assist to scoot to [facility] with bed in trendelenburg position. Pt tolerates sitting EOB ~5 minutes without LOB, declines need to walk to bathroom despite PT encouraging her to go (reports she is comfortable urinating on towel placed between her legs). Pt also voices anxiety re: procedure tomorrow with PT providing encouragement. Pt self limiting during session, not extremely open to education. Will continue to follow pt acutely & progress mobility as able.   If plan is discharge home, recommend the following: A little help with walking and/or transfers;A little help with bathing/dressing/bathroom;Assist for transportation;Help with stairs or ramp for entrance   Can travel by private vehicle        Equipment Recommendations  None recommended by PT    Recommendations for Other Services       Precautions / Restrictions Precautions Precautions: Fall Restrictions Weight Bearing Restrictions Per Provider Order: No     Mobility  Bed Mobility   Bed Mobility: Supine to Sit     Supine to sit: Min assist, HOB elevated, Used rails Sit to supine: Supervision, HOB elevated, Used rails   General bed mobility comments: assistance to scoot to [facility] with bed in trendelenburg position with cuing to use bed rails    Transfers Overall transfer level:  (pt declined)                      Ambulation/Gait                   Stairs             Wheelchair Mobility     Tilt  Bed    Modified Rankin (Stroke Patients Only)       Balance Overall balance assessment: Needs assistance Sitting-balance support: Feet supported, No upper extremity supported Sitting balance-Leahy Scale: Fair Sitting balance - Comments: supervision static sitting EOB                                    Communication    Cognition Arousal: Alert Behavior During Therapy:  (easily irritated)                           PT - Cognition Comments: question awareness, pt reports she can go home & do what she needs but then states it is difficult doing basic things Following commands: Impaired Following commands impaired: Follows one step commands with increased time    Cueing Cueing Techniques: Verbal cues  Exercises      General Comments        Pertinent Vitals/Pain Pain Assessment Pain Assessment: Faces Faces Pain Scale: Hurts even more Pain Location: back 2/2 lying on table for MRI earlier today Pain Descriptors / Indicators: Discomfort, Grimacing Pain Intervention(s): Monitored during session, Repositioned, Limited activity within patient's tolerance    Home Living  Prior Function            PT Goals (current goals can now be found in the care plan section) Acute Rehab PT Goals Patient Stated Goal: return home PT Goal Formulation: With patient/family Time For Goal Achievement: 04/25/24 Potential to Achieve Goals: Good Progress towards PT goals: Progressing toward goals    Frequency    Min 2X/week      PT Plan      Co-evaluation              AM-PAC PT "6 Clicks" Mobility   Outcome Measure  Help needed turning from your back to your side while in a flat bed without using bedrails?: None Help needed moving from lying on your back to sitting on the side of a flat bed without using bedrails?: A Little Help needed moving to and from a bed to a chair (including a wheelchair)?: A Little Help  needed standing up from a chair using your arms (e.g., wheelchair or bedside chair)?: A Lot Help needed to walk in hospital room?: A Little Help needed climbing 3-5 steps with a railing? : Total 6 Click Score: 16    End of Session   Activity Tolerance: Patient limited by fatigue;Patient limited by pain (pt self limiting) Patient left: in bed;with call bell/phone within reach;with bed alarm set Nurse Communication: Mobility status PT Visit Diagnosis: Unsteadiness on feet (R26.81);Other abnormalities of gait and mobility (R26.89);Muscle weakness (generalized) (M62.81);Pain Pain - part of body:  (back)     Time: 7846-9629 PT Time Calculation (min) (ACUTE ONLY): 11 min  Charges:    $Therapeutic Activity: 8-22 mins PT General Charges $$ ACUTE PT VISIT: 1 Visit                     Emaline Handsome, PT, DPT 04/12/24, 3:25 PM   Venetta Gill 04/12/2024, 3:23 PM

## 2024-04-12 NOTE — Progress Notes (Signed)
 Subjective/Chief Complaint: Patient resting comfortably ERCP scheduled for 4/23 No new LFT's today WBC normal   Objective: Vital signs in last 24 hours: Temp:  [98.2 F (36.8 C)-98.8 F (37.1 C)] 98.4 F (36.9 C) (04/22 0437) Pulse Rate:  [60-98] 60 (04/22 0437) Resp:  [16] 16 (04/22 0437) BP: (106-127)/(61-63) 106/62 (04/22 0437) SpO2:  [93 %-96 %] 93 % (04/22 0437) Last BM Date : 04/10/24  Intake/Output from previous day: 04/21 0701 - 04/22 0700 In: 354 [P.O.:354] Out: -  Intake/Output this shift: No intake/output data recorded.  Elderly female in NAD Abd - soft, non-tender; non-distended  Lab Results:  Recent Labs    04/11/24 0612 04/12/24 0704  WBC 11.9* 7.4  HGB 13.3 12.6  HCT 39.5 37.6  PLT 175 159   BMET Recent Labs    04/10/24 1710 04/11/24 0612  NA 140 141  K 3.6 3.7  CL 108 108  CO2 20* 25  GLUCOSE 135* 97  BUN 15 17  CREATININE 1.31* 1.05*  CALCIUM  8.9 8.6*   PT/INR No results for input(s): "LABPROT", "INR" in the last 72 hours. ABG No results for input(s): "PHART", "HCO3" in the last 72 hours.  Invalid input(s): "PCO2", "PO2"  Studies/Results: MR ABDOMEN MRCP W WO CONTAST Result Date: 04/11/2024 CLINICAL DATA:  Cholelithiasis; Cholelithiasis 92631 Cholecystitis 92631 EXAM: MRI ABDOMEN WITHOUT AND WITH CONTRAST (INCLUDING MRCP) TECHNIQUE: Multiplanar multisequence MR imaging of the abdomen was performed both before and after the administration of intravenous contrast. Heavily T2-weighted images of the biliary and pancreatic ducts were obtained, and three-dimensional MRCP images were rendered by post processing. CONTRAST:  6mL GADAVIST  GADOBUTROL  1 MMOL/ML IV SOLN COMPARISON:  Abdominal ultrasound 04/10/2024. Abdominal CT 11/19/2023. FINDINGS: Technical note: Despite efforts by the technologist and patient, mild motion artifact is present on today's exam and could not be eliminated. This reduces exam sensitivity and specificity. Lower  chest: Dependent opacities at both lung bases appear increased from previous CT, possibly due to progressive fibrosis or superimposed atelectasis/inflammation. Hepatobiliary: The liver has a non cirrhotic morphology, without significant steatosis. No focal liver lesions identified. Multiple small gallstones are noted with mild gallbladder distension and mild pericholecystic fluid. No significant intrahepatic biliary dilatation. There is mild extrahepatic biliary dilatation with the common hepatic duct measuring 6 mm in diameter and the common bile duct 10 mm. There are small intraductal stones within the distal common bile duct, best seen on the coronal images. Pancreas: Diffuse fatty replacement as on previous CT. No pancreatic ductal dilatation or surrounding inflammation. Spleen: Normal in size without focal abnormality. Adrenals/Urinary Tract: Both adrenal glands appear normal. No evidence of enhancing renal mass, hydronephrosis or asymmetric perinephric soft tissue stranding. There are simple renal cysts bilaterally for which no specific follow-up imaging is recommended. Stomach/Bowel: The stomach appears unremarkable for its degree of distension. No evidence of bowel wall thickening, distention or surrounding inflammatory change. Vascular/Lymphatic: Stable prominent lymph nodes in the porta hepatis, likely reactive. No acute vascular findings. There is mild aortoiliac tortuosity. Other: No significant ascites or focal extraluminal fluid collection. Musculoskeletal: Multiple lumbar compression deformities are grossly unchanged from previous CT. IMPRESSION: 1. Mild extrahepatic biliary dilatation with small intraductal stones in the distal common bile duct. 2. Cholelithiasis with mild gallbladder distension and mild pericholecystic fluid, suspicious for early acute cholecystitis, not demonstrated on ultrasound yesterday. Correlate clinically. 3. No other acute abdominal findings. 4. Dependent opacities at both  lung bases appear increased from previous CT, possibly due to progressive fibrosis or superimposed atelectasis/inflammation.  Recommend chest radiographic correlation. Electronically Signed   By: Elmon Hagedorn M.D.   On: 04/11/2024 11:19   MR 3D Recon At Scanner Result Date: 04/11/2024 CLINICAL DATA:  Cholelithiasis; Cholelithiasis 92631 Cholecystitis 92631 EXAM: MRI ABDOMEN WITHOUT AND WITH CONTRAST (INCLUDING MRCP) TECHNIQUE: Multiplanar multisequence MR imaging of the abdomen was performed both before and after the administration of intravenous contrast. Heavily T2-weighted images of the biliary and pancreatic ducts were obtained, and three-dimensional MRCP images were rendered by post processing. CONTRAST:  6mL GADAVIST  GADOBUTROL  1 MMOL/ML IV SOLN COMPARISON:  Abdominal ultrasound 04/10/2024. Abdominal CT 11/19/2023. FINDINGS: Technical note: Despite efforts by the technologist and patient, mild motion artifact is present on today's exam and could not be eliminated. This reduces exam sensitivity and specificity. Lower chest: Dependent opacities at both lung bases appear increased from previous CT, possibly due to progressive fibrosis or superimposed atelectasis/inflammation. Hepatobiliary: The liver has a non cirrhotic morphology, without significant steatosis. No focal liver lesions identified. Multiple small gallstones are noted with mild gallbladder distension and mild pericholecystic fluid. No significant intrahepatic biliary dilatation. There is mild extrahepatic biliary dilatation with the common hepatic duct measuring 6 mm in diameter and the common bile duct 10 mm. There are small intraductal stones within the distal common bile duct, best seen on the coronal images. Pancreas: Diffuse fatty replacement as on previous CT. No pancreatic ductal dilatation or surrounding inflammation. Spleen: Normal in size without focal abnormality. Adrenals/Urinary Tract: Both adrenal glands appear normal. No evidence  of enhancing renal mass, hydronephrosis or asymmetric perinephric soft tissue stranding. There are simple renal cysts bilaterally for which no specific follow-up imaging is recommended. Stomach/Bowel: The stomach appears unremarkable for its degree of distension. No evidence of bowel wall thickening, distention or surrounding inflammatory change. Vascular/Lymphatic: Stable prominent lymph nodes in the porta hepatis, likely reactive. No acute vascular findings. There is mild aortoiliac tortuosity. Other: No significant ascites or focal extraluminal fluid collection. Musculoskeletal: Multiple lumbar compression deformities are grossly unchanged from previous CT. IMPRESSION: 1. Mild extrahepatic biliary dilatation with small intraductal stones in the distal common bile duct. 2. Cholelithiasis with mild gallbladder distension and mild pericholecystic fluid, suspicious for early acute cholecystitis, not demonstrated on ultrasound yesterday. Correlate clinically. 3. No other acute abdominal findings. 4. Dependent opacities at both lung bases appear increased from previous CT, possibly due to progressive fibrosis or superimposed atelectasis/inflammation. Recommend chest radiographic correlation. Electronically Signed   By: Elmon Hagedorn M.D.   On: 04/11/2024 11:19   US  Abdomen Limited RUQ (LIVER/GB) Result Date: 04/10/2024 CLINICAL DATA:  Abdominal pain EXAM: ULTRASOUND ABDOMEN LIMITED RIGHT UPPER QUADRANT COMPARISON:  CT abdomen pelvis 11/19/2023 FINDINGS: Gallbladder: Cholelithiasis. No wall thickening or pericholecystic fluid. Negative sonographic Murphy's sign. Common bile duct: Diameter: 8 mm. Liver: No focal lesion identified. Increased parenchymal echogenicity. Portal vein is patent on color Doppler imaging with normal direction of blood flow towards the liver. Other: None. IMPRESSION: 1. Cholelithiasis without evidence of acute cholecystitis. 2. Hepatic steatosis. Electronically Signed   By: Rozell Cornet  M.D.   On: 04/10/2024 19:57   DG Abd Acute W/Chest Result Date: 04/10/2024 CLINICAL DATA:  Vomiting EXAM: DG ABDOMEN ACUTE WITH 1 VIEW CHEST COMPARISON:  11/19/2023 FINDINGS: Heart size and mediastinal contours are within normal limits. Lungs are clear. No effusion. No free air. Normal bowel gas pattern. There are no abnormal calcifications. Spondylitic changes in the lumbar spine. Left femoral neck fixation hardware partially visualized. IMPRESSION: No acute findings. Electronically Signed   By: Baker Bon  Marlena Sima M.D.   On: 04/10/2024 18:27    Anti-infectives: Anti-infectives (From admission, onward)    Start     Dose/Rate Route Frequency Ordered Stop   04/11/24 1000  cefTRIAXone  (ROCEPHIN ) 2 g in sodium chloride  0.9 % 100 mL IVPB        2 g 200 mL/hr over 30 Minutes Intravenous Every 24 hours 04/10/24 2208 04/18/24 0959   04/10/24 2230  metroNIDAZOLE  (FLAGYL ) IVPB 500 mg        500 mg 100 mL/hr over 60 Minutes Intravenous Every 12 hours 04/10/24 2208 04/17/24 2159   04/10/24 2015  cefTRIAXone  (ROCEPHIN ) 1 g in sodium chloride  0.9 % 100 mL IVPB        1 g 200 mL/hr over 30 Minutes Intravenous  Once 04/10/24 2005 04/10/24 2232       Assessment/Plan: Choledocholithiasis ERCP pending  No clinical signs of acute cholecystitis - normal WBC, no abdominal tenderness No urgent indications for laparoscopic cholecystectomy.  We will reevaluate after ERCP to see if patient needs to have surgery.    LOS: 2 days    Rella Cardinal 04/12/2024

## 2024-04-13 ENCOUNTER — Inpatient Hospital Stay (HOSPITAL_COMMUNITY): Admitting: Anesthesiology

## 2024-04-13 ENCOUNTER — Encounter (HOSPITAL_COMMUNITY)
Admission: EM | Disposition: A | Discharge status: Home / Self Care | Payer: Self-pay | Source: Home / Self Care | Attending: Student

## 2024-04-13 ENCOUNTER — Inpatient Hospital Stay (HOSPITAL_COMMUNITY)

## 2024-04-13 ENCOUNTER — Encounter (HOSPITAL_COMMUNITY): Payer: Self-pay | Admitting: Internal Medicine

## 2024-04-13 DIAGNOSIS — K315 Obstruction of duodenum: Secondary | ICD-10-CM

## 2024-04-13 DIAGNOSIS — K838 Other specified diseases of biliary tract: Secondary | ICD-10-CM

## 2024-04-13 DIAGNOSIS — K8051 Calculus of bile duct without cholangitis or cholecystitis with obstruction: Secondary | ICD-10-CM | POA: Diagnosis not present

## 2024-04-13 DIAGNOSIS — K8050 Calculus of bile duct without cholangitis or cholecystitis without obstruction: Secondary | ICD-10-CM | POA: Diagnosis not present

## 2024-04-13 HISTORY — PX: BILIARY DILATION: SHX6850

## 2024-04-13 HISTORY — PX: ERCP: SHX5425

## 2024-04-13 LAB — COMPREHENSIVE METABOLIC PANEL WITH GFR
ALT: 113 U/L — ABNORMAL HIGH (ref 0–44)
AST: 243 U/L — ABNORMAL HIGH (ref 15–41)
Albumin: 2.4 g/dL — ABNORMAL LOW (ref 3.5–5.0)
Alkaline Phosphatase: 309 U/L — ABNORMAL HIGH (ref 38–126)
Anion gap: 9 (ref 5–15)
BUN: 10 mg/dL (ref 8–23)
CO2: 22 mmol/L (ref 22–32)
Calcium: 8.5 mg/dL — ABNORMAL LOW (ref 8.9–10.3)
Chloride: 107 mmol/L (ref 98–111)
Creatinine, Ser: 0.72 mg/dL (ref 0.44–1.00)
GFR, Estimated: >60 mL/min (ref >60)
Glucose, Bld: 94 mg/dL (ref 70–99)
Potassium: 3.6 mmol/L (ref 3.5–5.1)
Sodium: 138 mmol/L (ref 135–145)
Total Bilirubin: 1.7 mg/dL — ABNORMAL HIGH (ref 0.0–1.2)
Total Protein: 5.1 g/dL — ABNORMAL LOW (ref 6.5–8.1)

## 2024-04-13 LAB — CBC
HCT: 38.8 % (ref 36.0–46.0)
Hemoglobin: 13 g/dL (ref 12.0–15.0)
MCH: 34.6 pg — ABNORMAL HIGH (ref 26.0–34.0)
MCHC: 33.5 g/dL (ref 30.0–36.0)
MCV: 103.2 fL — ABNORMAL HIGH (ref 80.0–100.0)
Platelets: 164 10*3/uL (ref 150–400)
RBC: 3.76 MIL/uL — ABNORMAL LOW (ref 3.87–5.11)
RDW: 15.3 % (ref 11.5–15.5)
WBC: 7.7 10*3/uL (ref 4.0–10.5)
nRBC: 0 % (ref 0.0–0.2)

## 2024-04-13 SURGERY — ERCP, WITH INTERVENTION IF INDICATED
Anesthesia: General

## 2024-04-13 MED ORDER — SODIUM CHLORIDE 0.9 % IV SOLN: INTRAVENOUS | Status: DC | PRN

## 2024-04-13 MED ORDER — SODIUM CHLORIDE 0.9 % IV SOLN
INTRAVENOUS | Status: AC | PRN
  Administered 2024-04-13: 500 mL via INTRAMUSCULAR

## 2024-04-13 MED ORDER — ROCURONIUM BROMIDE 10 MG/ML (PF) SYRINGE
PREFILLED_SYRINGE | INTRAVENOUS | Status: DC | PRN
  Administered 2024-04-13: 50 mg via INTRAVENOUS

## 2024-04-13 MED ORDER — DICLOFENAC SUPPOSITORY 100 MG
RECTAL | Status: AC
  Filled 2024-04-13: qty 1

## 2024-04-13 MED ORDER — PHENOL 1.4 % MT LIQD
1.0000 | OROMUCOSAL | Status: DC | PRN
  Administered 2024-04-13: 1 via OROMUCOSAL
  Filled 2024-04-13: qty 177

## 2024-04-13 MED ORDER — SUGAMMADEX SODIUM 200 MG/2ML IV SOLN
INTRAVENOUS | Status: DC | PRN
  Administered 2024-04-13: 200 mg via INTRAVENOUS

## 2024-04-13 MED ORDER — FENTANYL CITRATE (PF) 250 MCG/5ML IJ SOLN
INTRAMUSCULAR | Status: DC | PRN
Start: 2024-04-13 — End: 2024-04-13
  Administered 2024-04-13 (×2): 25 ug via INTRAVENOUS

## 2024-04-13 MED ORDER — ONDANSETRON HCL 4 MG/2ML IJ SOLN
INTRAMUSCULAR | Status: DC | PRN
  Administered 2024-04-13: 4 mg via INTRAVENOUS

## 2024-04-13 MED ORDER — PHENYLEPHRINE HCL-NACL 20-0.9 MG/250ML-% IV SOLN
INTRAVENOUS | Status: DC | PRN
  Administered 2024-04-13: 50 ug/min via INTRAVENOUS

## 2024-04-13 MED ORDER — GLUCAGON HCL RDNA (DIAGNOSTIC) 1 MG IJ SOLR
INTRAMUSCULAR | Status: AC
  Filled 2024-04-13: qty 1

## 2024-04-13 MED ORDER — INDOMETHACIN 50 MG RE SUPP
RECTAL | Status: AC
Start: 2024-04-13 — End: ?
  Filled 2024-04-13: qty 2

## 2024-04-13 MED ORDER — GLUCAGON HCL RDNA (DIAGNOSTIC) 1 MG IJ SOLR
INTRAMUSCULAR | Status: DC | PRN
  Administered 2024-04-13 (×3): .25 mg via INTRAVENOUS

## 2024-04-13 MED ORDER — EPHEDRINE SULFATE-NACL 50-0.9 MG/10ML-% IV SOSY
PREFILLED_SYRINGE | INTRAVENOUS | Status: DC | PRN
  Administered 2024-04-13: 10 mg via INTRAVENOUS
  Administered 2024-04-13: 5 mg via INTRAVENOUS
  Administered 2024-04-13: 10 mg via INTRAVENOUS

## 2024-04-13 MED ORDER — DEXAMETHASONE SODIUM PHOSPHATE 10 MG/ML IJ SOLN
INTRAMUSCULAR | Status: DC | PRN
  Administered 2024-04-13: 5 mg via INTRAVENOUS

## 2024-04-13 MED ORDER — DICLOFENAC SUPPOSITORY 100 MG: 100.0000 mg | Freq: Once | RECTAL | Status: AC

## 2024-04-13 MED ORDER — FENTANYL CITRATE (PF) 100 MCG/2ML IJ SOLN
INTRAMUSCULAR | Status: AC
  Filled 2024-04-13: qty 2

## 2024-04-13 MED ORDER — LIDOCAINE 2% (20 MG/ML) 5 ML SYRINGE
INTRAMUSCULAR | Status: DC | PRN
  Administered 2024-04-13: 20 mg via INTRAVENOUS

## 2024-04-13 MED ORDER — DICLOFENAC SUPPOSITORY 100 MG
RECTAL | Status: DC | PRN
  Administered 2024-04-13: 100 mg via RECTAL

## 2024-04-13 MED ORDER — PROPOFOL 10 MG/ML IV BOLUS
INTRAVENOUS | Status: DC | PRN
Start: 2024-04-13 — End: 2024-04-13
  Administered 2024-04-13: 100 mg via INTRAVENOUS

## 2024-04-13 NOTE — Progress Notes (Signed)
 OT Cancellation Note  Patient Details Name: Stacy Valencia MRN: [id number] DOB: 15-Apr-1933   Cancelled Treatment:    Reason Eval/Treat Not Completed: Patient declined, no reason specified (Patient declined OT treatment this morning. Patient declined OOB or EOB stating "I don't need to get EOB or do anything") Anitra Barn, OTA Acute Rehabilitation Services  Office [phone]  Jovita Nipper 04/13/2024, 7:55 AM

## 2024-04-13 NOTE — Progress Notes (Addendum)
 PROGRESS NOTE  Stacy Valencia ZOX:[id number] DOB: 1933/12/15 DOA: 04/10/2024 PCP: Renetta Carter, MD (Inactive)   LOS: 3 days   Brief Narrative / Interim history: 89 year old female with PAF, prior PE/DVT on chronic anticoagulation, comes into the hospital with nausea and vomiting for the past 3 days, poor p.o. intake.  She was found to have elevated LFTs, on MRI/MRCP showed choledocholithiasis and possible cholecystitis, GI and general surgery consulted  Subjective / 24h Interval events: She is doing well this morning, denies any abdominal pain, no nausea or vomiting.  Assesement and Plan: Principal Problem:   Choledocholithiasis Active Problems:   AKI (acute kidney injury) (HCC)   Atrial fibrillation (HCC)   Pulmonary embolism (HCC)   GERD (gastroesophageal reflux disease)   DVT (deep venous thrombosis) (HCC)   Adjustment disorder with anxious mood   Acute cholecystitis   Hypokalemia  Principal problem Choledocholithiasis, concern for early cholecystitis, elevated LFTs-GI and general surgery consulted, she was placed on antibiotics, continue.  An MRCP showed mild extrahepatic biliary dilatation with small intraductal stone in the distal CBD as well as findings concerning for early acute cholecystitis.  She is to go have an ERCP today.  General surgery consulted as well, no plans for cholecystectomy up until seeing how she does following the ERCP  Active problems Hypokalemia-continue to monitor and replace potassium as indicated  PAF-continue metoprolol , Xarelto  now on hold, resume postprocedure per GI/surgery  Transient hypotension, history of falls-continue midodrine   Scheduled Meds:  diclofenac   100 mg Rectal Once   [MAR Hold] leptospermum manuka honey  1 Application Topical Daily   [MAR Hold] metoprolol  tartrate  12.5 mg Oral BID   [MAR Hold] midodrine   5 mg Oral BID WC   [MAR Hold] pantoprazole   40 mg Oral Daily   Continuous Infusions:  sodium chloride  500 mL  (04/13/24 1026)   [MAR Hold] cefTRIAXone  (ROCEPHIN )  IV 2 g (04/13/24 0906)   [MAR Hold] metronidazole  500 mg (04/13/24 0908)   PRN Meds:.sodium chloride , [MAR Hold] acetaminophen  **OR** [MAR Hold] acetaminophen , [MAR Hold] albuterol , [MAR Hold] calcium  carbonate, [MAR Hold] HYDROcodone -acetaminophen , [MAR Hold]  morphine  injection, [MAR Hold] ondansetron  **OR** [MAR Hold] ondansetron  (ZOFRAN ) IV, [MAR Hold] polyethylene glycol  Current Outpatient Medications  Medication Instructions   acetaminophen  (TYLENOL ) 500 mg, Oral, Every 6 hours PRN   Cholecalciferol 50 MCG (2000 UT) TABS Oral   metoprolol  tartrate (LOPRESSOR ) 12.5 mg, Oral, 2 times daily   midodrine  (PROAMATINE ) 5 mg, Oral, 2 times daily, Take 5 mg at 8AM and 5mg  at 2PM   omeprazole  (PRILOSEC) 20 mg, Daily   XARELTO  20 MG TABS tablet TAKE 1 TABLET BY MOUTH EVERY DAY    Diet Orders (From admission, onward)     Start     Ordered   04/13/24 0001  Diet NPO time specified Except for: Sips with Meds  Diet effective midnight       Question:  Except for  Answer:  Sips with Meds   04/12/24 1610            DVT prophylaxis:    Lab Results  Component Value Date   PLT 164 04/13/2024      Code Status: Full Code  Family Communication: No family at bedside  Status is: Inpatient Remains inpatient appropriate because: Severity of illness  Level of care: Med-Surg  Consultants:  GI Surgery   Objective: Vitals:   04/12/24 2233 04/13/24 0351 04/13/24 0725 04/13/24 1022  BP: (!) 111/54 (!) 119/58 108/64 (!) 128/55  Pulse: 61 (!) 57 (!) 55 (!) 44  Resp: 16 16  10   Temp:  98.4 F (36.9 C) 97.8 F (36.6 C) (!) 97.1 F (36.2 C)  TempSrc:    Temporal  SpO2:  95% 91% 97%  Weight:      Height:       No intake or output data in the 24 hours ending 04/13/24 1200 Wt Readings from Last 3 Encounters:  04/10/24 68 kg  03/21/24 68 kg  12/25/23 68 kg    Examination:  Constitutional: NAD Eyes: no scleral icterus ENMT:  Mucous membranes are moist.  Neck: normal, supple Respiratory: clear to auscultation bilaterally, no wheezing, no crackles. Normal respiratory effort. No accessory muscle use.  Cardiovascular: Regular rate and rhythm, no murmurs / rubs / gallops. No LE edema.  Abdomen: non distended, no tenderness. Bowel sounds positive.  Musculoskeletal: no clubbing / cyanosis.    Data Reviewed: I have independently reviewed following labs and imaging studies   CBC Recent Labs  Lab 04/10/24 1710 04/11/24 0612 04/12/24 0704 04/13/24 0453  WBC 13.4* 11.9* 7.4 7.7  HGB 14.4 13.3 12.6 13.0  HCT 43.7 39.5 37.6 38.8  PLT 180 175 159 164  MCV 106.8* 105.1* 104.7* 103.2*  MCH 35.2* 35.4* 35.1* 34.6*  MCHC 33.0 33.7 33.5 33.5  RDW 15.1 15.4 15.4 15.3  LYMPHSABS 1.0  --  2.2  --   MONOABS 0.6  --  0.4  --   EOSABS 0.1  --  0.2  --   BASOSABS 0.1  --  0.0  --     Recent Labs  Lab 04/10/24 1710 04/11/24 0612 04/12/24 0704 04/13/24 0453  NA 140 141 138 138  K 3.6 3.7 3.3* 3.6  CL 108 108 109 107  CO2 20* 25 23 22   GLUCOSE 135* 97 89 94  BUN 15 17 15 10   CREATININE 1.31* 1.05* 0.82 0.72  CALCIUM  8.9 8.6* 8.5* 8.5*  AST 628* 399* 237* 243*  ALT 219* 178* 130* 113*  ALKPHOS 216* 182* 168* 309*  BILITOT 4.1* 3.8* 1.7* 1.7*  ALBUMIN 2.7* 2.4* 2.3* 2.4*    ------------------------------------------------------------------------------------------------------------------ No results for input(s): "CHOL", "HDL", "LDLCALC", "TRIG", "CHOLHDL", "LDLDIRECT" in the last 72 hours.  Lab Results  Component Value Date   HGBA1C 5.5 07/23/2020   ------------------------------------------------------------------------------------------------------------------ No results for input(s): "TSH", "T4TOTAL", "T3FREE", "THYROIDAB" in the last 72 hours.  Invalid input(s): "FREET3"  Cardiac Enzymes No results for input(s): "CKMB", "TROPONINI", "MYOGLOBIN" in the last 168 hours.  Invalid input(s):  "CK" ------------------------------------------------------------------------------------------------------------------    Component Value Date/Time   BNP 437.2 (H) 11/19/2023 0553    CBG: No results for input(s): "GLUCAP" in the last 168 hours.  Recent Results (from the past 240 hours)  Blood culture (routine x 2)     Status: None (Preliminary result)   Collection Time: 04/10/24  8:38 PM   Specimen: BLOOD  Result Value Ref Range Status   Specimen Description BLOOD BLOOD RIGHT WRIST  Final   Special Requests   Final    BOTTLES DRAWN AEROBIC ONLY Blood Culture results may not be optimal due to an inadequate volume of blood received in culture bottles   Culture   Final    NO GROWTH 3 DAYS Performed at [facility], [address] [place], [place] [address]    Report Status PENDING  Incomplete  Blood culture (routine x 2)     Status: None (Preliminary result)   Collection Time: 04/10/24  8:47 PM  Specimen: BLOOD  Result Value Ref Range Status   Specimen Description   Final    BLOOD BLOOD LEFT ARM AEROBIC BOTTLE ONLY ANAEROBIC BOTTLE ONLY   Special Requests   Final    BOTTLES DRAWN AEROBIC AND ANAEROBIC Blood Culture adequate volume   Culture   Final    NO GROWTH 3 DAYS Performed at [facility], [address] [place], [place] [address]    Report Status PENDING  Incomplete     Radiology Studies: No results found.   Kathlen Para, MD, PhD [facility]  Between 7 am - 7 pm I am available, please contact me via Amion (for emergencies) or Securechat (non urgent messages)  Between 7 pm - 7 am I am not available, please contact night coverage MD/APP via Amion

## 2024-04-13 NOTE — Anesthesia Procedure Notes (Signed)
 Procedure Name: Intubation Date/Time: 04/13/2024 12:12 PM  Performed by: Andee Bamberger, CRNAPre-anesthesia Checklist: Patient identified, Emergency Drugs available, Suction available and Patient being monitored Patient Re-evaluated:Patient Re-evaluated prior to induction Oxygen  Delivery Method: Circle system utilized Preoxygenation: Pre-oxygenation with 100% oxygen  Induction Type: IV induction Ventilation: Mask ventilation without difficulty Laryngoscope Size: Miller and 2 Grade View: Grade I Tube type: Oral Tube size: 7.0 mm Number of attempts: 1 Airway Equipment and Method: Stylet and Oral airway Placement Confirmation: ETT inserted through vocal cords under direct vision, positive ETCO2 and breath sounds checked- equal and bilateral Secured at: 22 cm Tube secured with: Tape Dental Injury: Teeth and Oropharynx as per pre-operative assessment

## 2024-04-13 NOTE — Anesthesia Preprocedure Evaluation (Addendum)
 Anesthesia Evaluation  Patient identified by MRN, date of birth, ID band Patient awake and Patient confused    Reviewed: Allergy & Precautions, NPO status , Patient's Chart, lab work & pertinent test results  History of Anesthesia Complications Negative for: history of anesthetic complications  Airway Mallampati: I  TM Distance: >3 FB Neck ROM: Full    Dental no notable dental hx. (+) Edentulous Upper, Dental Advisory Given, Missing, Poor Dentition   Pulmonary PE (2005/2014, on Xarelto  (last dose 07/21/20) )   Pulmonary exam normal        Cardiovascular negative cardio ROS Normal cardiovascular exam     Neuro/Psych  PSYCHIATRIC DISORDERS       Neuromuscular disease    GI/Hepatic Neg liver ROS,GERD  Medicated and Controlled,,  Endo/Other  negative endocrine ROS    Renal/GU ARFRenal disease  Female GU complaint     Musculoskeletal negative musculoskeletal ROS (+)    Abdominal   Peds  Hematology  (+) Blood dyscrasia, anemia   Anesthesia Other Findings   Reproductive/Obstetrics negative OB ROS                             Anesthesia Physical Anesthesia Plan  ASA: 4  Anesthesia Plan: General   Post-op Pain Management: Minimal or no pain anticipated   Induction: Intravenous  PONV Risk Score and Plan: 4 or greater and Treatment may vary due to age or medical condition, Ondansetron  and Dexamethasone   Airway Management Planned: Oral ETT  Additional Equipment: None  Intra-op Plan:   Post-operative Plan: Extubation in OR  Informed Consent: I have reviewed the patients History and Physical, chart, labs and discussed the procedure including the risks, benefits and alternatives for the proposed anesthesia with the patient or authorized representative who has indicated his/her understanding and acceptance.     Dental advisory given and Consent reviewed with POA  Plan Discussed with:  CRNA and Anesthesiologist  Anesthesia Plan Comments:         Anesthesia Quick Evaluation

## 2024-04-13 NOTE — Plan of Care (Signed)
  Problem: Education: Goal: Knowledge of General Education information will improve Description: Including pain rating scale, medication(s)/side effects and non-pharmacologic comfort measures Outcome: Progressing   Problem: Health Behavior/Discharge Planning: Goal: Ability to manage health-related needs will improve Outcome: Progressing   Problem: Clinical Measurements: Goal: Ability to maintain clinical measurements within normal limits will improve Outcome: Progressing   Problem: Activity: Goal: Risk for activity intolerance will decrease Outcome: Progressing   Problem: Nutrition: Goal: Adequate nutrition will be maintained Outcome: Progressing   Problem: Pain Managment: Goal: General experience of comfort will improve and/or be controlled Outcome: Progressing

## 2024-04-13 NOTE — TOC Initial Note (Signed)
 Transition of Care [facility]) - Initial/Assessment Note    Patient Details  Name: Stacy Valencia MRN: [id number] Date of Birth: Nov 11, 1933  Transition of Care [facility]) CM/SW Contact:    Jeani Mill, RN Phone Number: 04/13/2024, 12:01 PM  Clinical Narrative:                 Spoke to patient and spouse at bedside regarding transition needs.  Patient lives with spouse and has all needed DME. Patient uses [employer] for transportation.  Patient will need [facility] transportation home.  Patient is agreeable to home health and has used [place] [facility] in the past.  Randel Buss with [facility] accepted referral.  Address, Phone number and PCP verified.  Will need home health PT, OT ,RN orders  Expected Discharge Plan: Home w Home Health Services Barriers to Discharge: Continued Medical Work up   Patient Goals and CMS Choice Patient states their goals for this hospitalization and ongoing recovery are:: return home with spouse CMS Medicare.gov Compare Post Acute Care list provided to:: Patient Choice offered to / list presented to : Patient      Expected Discharge Plan and Services   Discharge Planning Services: CM Consult Post Acute Care Choice: Home Health Living arrangements for the past 2 months: Single Family Home                           HH Arranged: RN, PT, OT HH Agency: [facility] Date [facility] Agency Contacted: 04/13/24 Time HH Agency Contacted: 1201 Representative spoke with at [facility] Agency: Randel Buss  Prior Living Arrangements/Services Living arrangements for the past 2 months: Single Family Home Lives with:: Spouse Patient language and need for interpreter reviewed:: Yes Do you feel safe going back to the place where you live?: Yes      Need for Family Participation in Patient Care: Yes (Comment) Care giver support system in place?: Yes (comment) Current home services: DME (walker, wheelchair,) Criminal Activity/Legal Involvement Pertinent to Current Situation/Hospitalization: No -  Comment as needed  Activities of Daily Living   ADL Screening (condition at time of admission) Independently performs ADLs?: No Does the patient have a NEW difficulty with bathing/dressing/toileting/self-feeding that is expected to last >3 days?: No Does the patient have a NEW difficulty with getting in/out of bed, walking, or climbing stairs that is expected to last >3 days?: Yes (Initiates electronic notice to provider for possible PT consult) Does the patient have a NEW difficulty with communication that is expected to last >3 days?: No Is the patient deaf or have difficulty hearing?: Yes Does the patient have difficulty seeing, even when wearing glasses/contacts?: No Does the patient have difficulty concentrating, remembering, or making decisions?: Yes  Permission Sought/Granted Permission sought to share information with : Photographer granted to share info w AGENCY: HH        Emotional Assessment Appearance:: Appears stated age Attitude/Demeanor/Rapport: Gracious, Engaged Affect (typically observed): Accepting Orientation: : Oriented to Self, Oriented to Place, Oriented to Situation, Oriented to  Time Alcohol / Substance Use: Not Applicable Psych Involvement: No (comment)  Admission diagnosis:  Choledocholithiasis [K80.50] Calculus of gallbladder with biliary obstruction but without cholecystitis [K80.21] Patient Active Problem List   Diagnosis Date Noted   Hypokalemia 04/12/2024   Acute cholecystitis 04/11/2024   Choledocholithiasis 04/10/2024   Atrial fibrillation with RVR (HCC) 11/20/2023   Fall 11/19/2023   Rhabdomyolysis 11/19/2023   Atrial fibrillation (HCC)  11/19/2023   SIRS (systemic inflammatory response syndrome) (HCC) 11/19/2023   Elevated troponin 11/19/2023   Acute respiratory failure with hypoxia (HCC) 11/19/2023   Transient hypotension 11/19/2023   Hyponatremia 11/19/2023   Abnormal TSH 11/19/2023   UTI (urinary  tract infection) 08/13/2020   Adjustment disorder with anxious mood    Femur fracture, left (HCC) 07/25/2020   Supplemental oxygen  dependent    AKI (acute kidney injury) (HCC)    Sinus tachycardia    Acute blood loss anemia    Postoperative pain    Hip fracture (HCC) 07/22/2020   History of adverse effect of venous thromboembolism (VTE) prophylaxis 07/22/2020   DVT (deep venous thrombosis) (HCC) 01/07/2013   Pulmonary embolism (HCC) 01/05/2013   GERD (gastroesophageal reflux disease) 01/05/2013   PCP:  Renetta Carter, MD (Inactive) Pharmacy:   [facility] [id number] - [place], [place] - [address] [place] [address] Phone: [phone] Fax: [phone]  [id number] - [place] - [address] [place] [address] Phone: [phone] Fax: [phone]     Social Drivers of Health (SDOH) Social History: SDOH Screenings   Food Insecurity: No Food Insecurity (04/10/2024)  Housing: Low Risk  (04/10/2024)  Transportation Needs: No Transportation Needs (04/10/2024)  Utilities: Not At Risk (04/10/2024)  Social Connections: Moderately Integrated (04/10/2024)  Tobacco Use: Low Risk  (04/11/2024)   SDOH Interventions:     Readmission Risk Interventions     No data to display

## 2024-04-13 NOTE — Transfer of Care (Signed)
 Immediate Anesthesia Transfer of Care Note  Patient: Stacy Valencia  Procedure(s) Performed: ERCP, WITH INTERVENTION IF INDICATED DILATION, STRICTURE, BILE DUCT  Patient Location: PACU and Endoscopy Unit  Anesthesia Type:General  Level of Consciousness: awake, alert , and patient cooperative  Airway & Oxygen  Therapy: Patient Spontanous Breathing  Post-op Assessment: Report given to RN and Post -op Vital signs reviewed and stable  Post vital signs: Reviewed and stable  Last Vitals:  Vitals Value Taken Time  BP 121/61 04/13/24 1345  Temp 36.1 C 04/13/24 1345  Pulse 83 04/13/24 1346  Resp 29 04/13/24 1346  SpO2 95 % 04/13/24 1346  Vitals shown include unfiled device data.  Last Pain:  Vitals:   04/13/24 1345  TempSrc: Temporal  PainSc: 0-No pain      Patients Stated Pain Goal: 2 (04/12/24 0810)  Complications: No notable events documented.

## 2024-04-13 NOTE — Progress Notes (Addendum)
 Subjective: CC: Her husband [place] is at bedside.  Tolerated liquids yesterday without abdominal pain, n/v. NPO currently for ERCP. No abdominal pain currently.   Afebrile. No tachycardia or systolic hypotension. WBC wnl. T. Bili stable at 1.7. Alk Phos 309 (168). AST 243 (237). ALT 113 (130).   Objective: Vital signs in last 24 hours: Temp:  [97.8 F (36.6 C)-98.4 F (36.9 C)] 97.8 F (36.6 C) (04/23 0725) Pulse Rate:  [55-61] 55 (04/23 0725) Resp:  [16] 16 (04/23 0351) BP: (108-119)/(54-64) 108/64 (04/23 0725) SpO2:  [91 %-95 %] 91 % (04/23 0725) Last BM Date : 04/12/24  Intake/Output from previous day: No intake/output data recorded. Intake/Output this shift: No intake/output data recorded.  PE: Gen:  Alert, NAD, pleasant Abd: Soft, ND,NT, +BS Psych: Alert and orientated to self, place and situation. Stated the year was 2005.   Lab Results:  Recent Labs    04/12/24 0704 04/13/24 0453  WBC 7.4 7.7  HGB 12.6 13.0  HCT 37.6 38.8  PLT 159 164   BMET Recent Labs    04/12/24 0704 04/13/24 0453  NA 138 138  K 3.3* 3.6  CL 109 107  CO2 23 22  GLUCOSE 89 94  BUN 15 10  CREATININE 0.82 0.72  CALCIUM  8.5* 8.5*   PT/INR No results for input(s): "LABPROT", "INR" in the last 72 hours. CMP     Component Value Date/Time   NA 138 04/13/2024 0453   NA 138 12/20/2013 0952   K 3.6 04/13/2024 0453   K 4.3 12/20/2013 0952   CL 107 04/13/2024 0453   CL 106 01/27/2013 1033   CO2 22 04/13/2024 0453   CO2 22 12/20/2013 0952   GLUCOSE 94 04/13/2024 0453   GLUCOSE 83 12/20/2013 0952   GLUCOSE 109 (H) 01/27/2013 1033   BUN 10 04/13/2024 0453   BUN 20.8 12/20/2013 0952   CREATININE 0.72 04/13/2024 0453   CREATININE 1.0 12/20/2013 0952   CALCIUM  8.5 (L) 04/13/2024 0453   CALCIUM  9.7 12/20/2013 0952   PROT 5.1 (L) 04/13/2024 0453   PROT 7.1 12/20/2013 0952   ALBUMIN 2.4 (L) 04/13/2024 0453   ALBUMIN 3.6 12/20/2013 0952   AST 243 (H) 04/13/2024 0453   AST 21  12/20/2013 0952   ALT 113 (H) 04/13/2024 0453   ALT 20 12/20/2013 0952   ALKPHOS 309 (H) 04/13/2024 0453   ALKPHOS 84 12/20/2013 0952   BILITOT 1.7 (H) 04/13/2024 0453   BILITOT 0.54 12/20/2013 0952   GFRNONAA >60 04/13/2024 0453   GFRAA >60 08/06/2020 0450   Lipase     Component Value Date/Time   LIPASE 21 04/10/2024 1710    Studies/Results: No results found.  Anti-infectives: Anti-infectives (From admission, onward)    Start     Dose/Rate Route Frequency Ordered Stop   04/11/24 1000  cefTRIAXone  (ROCEPHIN ) 2 g in sodium chloride  0.9 % 100 mL IVPB        2 g 200 mL/hr over 30 Minutes Intravenous Every 24 hours 04/10/24 2208 04/18/24 0959   04/10/24 2230  metroNIDAZOLE  (FLAGYL ) IVPB 500 mg        500 mg 100 mL/hr over 60 Minutes Intravenous Every 12 hours 04/10/24 2208 04/17/24 2159   04/10/24 2015  cefTRIAXone  (ROCEPHIN ) 1 g in sodium chloride  0.9 % 100 mL IVPB        1 g 200 mL/hr over 30 Minutes Intravenous  Once 04/10/24 2005 04/10/24 2232  Assessment/Plan Choledocholithiasis  - ERCP planned for today - No current clinical signs of Cholecystitis. She is afebrile with normal wbc and non-tender.  - No urgent indications for laparoscopic cholecystectomy.  - Discussed w/ pt and her husband Laparoscopic Cholecystectomy indications. We discussed this would be to prevent recurrent Choledocholithiasis along with other pathology of the gallbladder (Cholangitis, Cholecystitis, Gallstone Pancreatitis along with possible complications of this - all that could lead to sepsis and event death). I have explained the procedure, risks, and aftercare of Laparoscopic cholecystectomy.  Risks include but are not limited to anesthesia (MI, CVA, death, prolonged intubation and aspiration), bleeding, infection, wound problems, hernia, bile leak, injury to common bile duct/liver/intestine, possible need for subtotal cholecystectomy or open cholecystectomy, increased risk of DVT/PE and  diarrhea post op. Patient and her husband would like to talk more about this. At this time are not sure they would want to proceed w/ Lap Chole and are currently leaning towards holding off on surgery. She has no current clinical signs of Cholecystitis and therefor does not have any urgent indication for Lap Chole at this time. Will see how she tolerates ERCP and re-visit conversation. Repeat labs in AM.   FEN - NPO for ERCP VTE - Xarelto  on hold ID - Rocephin /Flagyl  per primary/GI   A fib H/O DVT/PE GERD Transient hypotension Adjustment disorder Frequent falls  I reviewed nursing notes, Consultant (GI) notes, hospitalist notes, last 24 h vitals and pain scores, last 48 h intake and output, last 24 h labs and trends, and last 24 h imaging results.    LOS: 3 days    Delton Filbert, [facility] 04/13/2024, 9:49 AM Please see Amion for pager number during day hours 7:00am-4:30pm

## 2024-04-13 NOTE — Interval H&P Note (Signed)
 History and Physical Interval Note:  04/13/2024 11:42 AM  Stacy Valencia  has presented today for surgery, with the diagnosis of CBD stones.  The various methods of treatment have been discussed with the patient and family. After consideration of risks, benefits and other options for treatment, the patient has consented to  Procedure(s): ERCP, WITH INTERVENTION IF INDICATED (N/A) as a surgical intervention.  The patient's history has been reviewed, patient examined, no change in status, stable for surgery.  I have reviewed the patient's chart and labs.  Questions were answered to the patient's satisfaction.     Lajuan Pila

## 2024-04-13 NOTE — Op Note (Signed)
[facility] Patient Name: Stacy Valencia Procedure Date : 04/13/2024 MRN: [id number] Attending MD: Lajuan Pila , MD, [id number] Date of Birth: February 02, 1933 CSN: [id number] Age: 89 Admit Type: Inpatient Procedure:                ERCP Indications:              Bile duct stone(s) with abnormal LFTs on MRCP Providers:                Lajuan Pila, MD, Millicent Ally, RN, Faustina                            Mbumina, Technician Referring MD:              Medicines:                General Anesthesia, Indomethacin  100 mg PR, Cipro                             400 mg IV, glucagon  0.75 mg IV in divided doses. Complications:            No immediate complications. Estimated Blood Loss:     Estimated blood loss was minimal. Procedure:                Pre-Anesthesia Assessment:                           - Prior to the procedure, a History and Physical                            was performed, and patient medications and                            allergies were reviewed. The patient's tolerance of                            previous anesthesia was also reviewed. The risks                            and benefits of the procedure and the sedation                            options and risks were discussed with the patient.                            All questions were answered, and informed consent                            was obtained. Prior Anticoagulants: The patient has                            taken Xarelto  (rivaroxaban ), last dose was 2 days                            prior to procedure. ASA Grade Assessment: IV - A  patient with severe systemic disease that is a                            constant threat to life. After reviewing the risks                            and benefits, the patient was deemed in                            satisfactory condition to undergo the procedure.                           After obtaining informed consent, the scope was                             passed under direct vision. Throughout the                            procedure, the patient's blood pressure, pulse, and                            oxygen  saturations were monitored continuously. The                            [id number]-[id number] ([id number] Olympus duodenoscope was                            introduced through the mouth, and used to inject                            contrast into and used to inject contrast into the                            bile duct. The ERCP was somewhat difficult due to                            abnormal anatomy. The patient tolerated the                            procedure well. Scope In: Scope Out: Findings:      The scout film was normal. The esophagus was successfully intubated       under direct vision. The scope was advanced to the first portion of the       duodenum and could not be passed beyond d/t benign-appearing stricture       D1-D2 junction. Limited exam was performed for the upper GI tract.      The stricture was dilated with the balloon using a wire-guided approach       (under fluoroscopic guidance) to 12 mm and then 13.5 mm x 1 minute each.       Thereafter the ERCP scope was passed to the second portion of the       duodenum. Major papilla was normal.      The bile duct was deeply cannulated with the short-nosed traction  sphincterotome using a wire-guided approach on first attempt. Contrast       was injected. I personally interpreted the bile duct images. Ductal flow       of contrast was adequate. Image quality was adequate. Multiple filling       defects consistent with choledocholithiasis was found in mid and distal       CBD. The CBD was dilated at 12 mm. The right and left hepatic ducts were       minimally dilated. The cystic duct did fill. Limited filling of       gallbladder.      6 mm biliary sphincterotomy was made with a traction (standard)       sphincterotome using ERBE electrocautery. Minimal  oozing which stopped       on its own. Dilation of the common bile duct with a 10-02-11 mm CRE       balloon (to a maximum balloon size of 10 mm) dilator was successful. The       biliary tree was swept with a 12 mm balloon starting at the bifurcation       several times. Multiple stones and sludge was swept from the duct. No       residual stones on postocclusion cholangiogram. Bile was green in color.       No pus.      PD was intentionally not cannulated or injected. Indocin  suppositories       were given prior to insertion of the scope. Impression:               - D1/D2 junction duodenal stricture s/p balloon                            dilatation.                           - Choledocholithiasis s/p biliary                            sphincterotomy/sphincteroplasty followed by stone                            extraction.                           - Cholelithiasis with patent cystic duct. Recommendation:           - Return patient to hospital ward for ongoing care.                           - Clear liquid diet.                           - Resume Xarelto  on 4/26 from GI standpoint.                           - Would recommend laparoscopic cholecystectomy.                            Appreciate surgical consultation                           -  Watch for pancreatitis, bleeding, perforation,                            and cholangitis.                           - The findings and recommendations were discussed                            with the patient's family. Procedure Code(s):        --- Professional ---                           [phone], 59, Endoscopic retrograde                            cholangiopancreatography (ERCP); with                            trans-endoscopic balloon dilation of                            biliary/pancreatic duct(s) or of ampulla                            (sphincteroplasty), including sphincterotomy, when                            performed, each duct                            43264, Endoscopic retrograde                            cholangiopancreatography (ERCP); with removal of                            calculi/debris from biliary/pancreatic duct(s)                           [id number], Endoscopic catheterization of the biliary                            ductal system, radiological supervision and                            interpretation Diagnosis Code(s):        --- Professional ---                           K80.51, Calculus of bile duct without cholangitis                            or cholecystitis with obstruction CPT copyright 2022 [employer]. All rights reserved. The codes documented in this report are preliminary and upon coder review may  be revised to meet current compliance requirements. Lajuan Pila, MD 04/13/2024 1:53:46 PM This report has been signed electronically. Number of Addenda:  0 

## 2024-04-13 NOTE — Plan of Care (Signed)

## 2024-04-14 ENCOUNTER — Other Ambulatory Visit (HOSPITAL_COMMUNITY): Payer: Self-pay

## 2024-04-14 ENCOUNTER — Encounter (HOSPITAL_COMMUNITY): Payer: Self-pay | Admitting: Gastroenterology

## 2024-04-14 DIAGNOSIS — K8050 Calculus of bile duct without cholangitis or cholecystitis without obstruction: Secondary | ICD-10-CM | POA: Diagnosis not present

## 2024-04-14 LAB — COMPREHENSIVE METABOLIC PANEL WITH GFR
ALT: 80 U/L — ABNORMAL HIGH (ref 0–44)
AST: 109 U/L — ABNORMAL HIGH (ref 15–41)
Albumin: 2.2 g/dL — ABNORMAL LOW (ref 3.5–5.0)
Alkaline Phosphatase: 264 U/L — ABNORMAL HIGH (ref 38–126)
Anion gap: 10 (ref 5–15)
BUN: 11 mg/dL (ref 8–23)
CO2: 20 mmol/L — ABNORMAL LOW (ref 22–32)
Calcium: 8.4 mg/dL — ABNORMAL LOW (ref 8.9–10.3)
Chloride: 108 mmol/L (ref 98–111)
Creatinine, Ser: 0.66 mg/dL (ref 0.44–1.00)
GFR, Estimated: >60 mL/min (ref >60)
Glucose, Bld: 136 mg/dL — ABNORMAL HIGH (ref 70–99)
Potassium: 4 mmol/L (ref 3.5–5.1)
Sodium: 138 mmol/L (ref 135–145)
Total Bilirubin: 1 mg/dL (ref 0.0–1.2)
Total Protein: 5.1 g/dL — ABNORMAL LOW (ref 6.5–8.1)

## 2024-04-14 LAB — CBC
HCT: 38.1 % (ref 36.0–46.0)
Hemoglobin: 12.9 g/dL (ref 12.0–15.0)
MCH: 35.5 pg — ABNORMAL HIGH (ref 26.0–34.0)
MCHC: 33.9 g/dL (ref 30.0–36.0)
MCV: 105 fL — ABNORMAL HIGH (ref 80.0–100.0)
Platelets: 173 10*3/uL (ref 150–400)
RBC: 3.63 MIL/uL — ABNORMAL LOW (ref 3.87–5.11)
RDW: 15.5 % (ref 11.5–15.5)
WBC: 7.1 10*3/uL (ref 4.0–10.5)
nRBC: 0 % (ref 0.0–0.2)

## 2024-04-14 LAB — LIPASE, BLOOD: Lipase: 18 U/L (ref 11–51)

## 2024-04-14 MED ORDER — AMOXICILLIN-POT CLAVULANATE 875-125 MG PO TABS
1.0000 | ORAL_TABLET | Freq: Two times a day (BID) | ORAL | 0 refills | Status: AC
  Filled 2024-04-14: qty 10, 5d supply, fill #0

## 2024-04-14 NOTE — Discharge Summary (Signed)
 Physician Discharge Summary  Stacy Valencia:[id number] DOB: May 24, 1933 DOA: 04/10/2024  PCP: Renetta Carter, MD (Inactive)  Admit date: 04/10/2024 Discharge date: 04/14/2024  Admitted From: home Disposition:  home  Recommendations for Outpatient Follow-up:  Follow up with PCP in 1-2 weeks  Home Health: PT, OT Equipment/Devices: none  Discharge Condition: stable CODE STATUS: Full code Diet Orders (From admission, onward)     Start     Ordered   04/14/24 1129  Diet regular Room service appropriate? Yes with Assist; Fluid consistency: Thin  Diet effective now       Question Answer Comment  Room service appropriate? Yes with Assist   Fluid consistency: Thin      04/14/24 1128            Brief Narrative / Interim history: 89 year old female with PAF, prior PE/DVT on chronic anticoagulation, comes into the hospital with nausea and vomiting for the past 3 days, poor p.o. intake.  She was found to have elevated LFTs, on MRI/MRCP showed choledocholithiasis and possible cholecystitis, GI and general surgery consulted  Hospital Course / Discharge diagnoses: Principal Problem:   Choledocholithiasis Active Problems:   AKI (acute kidney injury) (HCC)   Atrial fibrillation (HCC)   Pulmonary embolism (HCC)   GERD (gastroesophageal reflux disease)   DVT (deep venous thrombosis) (HCC)   Adjustment disorder with anxious mood   Acute cholecystitis   Hypokalemia   Principal problem Choledocholithiasis, concern for early cholecystitis, elevated LFTs -gastroenterology as well as general surgery consulted and followed patient while hospitalized.  She underwent an MRCP which showed mild extrahepatic biliary dilatation with small intraductal stone in the distal CBD as well as findings concerning for early acute cholecystitis.  She underwent an ERCP on 4/23 which confirmed choledocholithiasis, status post biliary sphincterotomy and sphincteroplasty.  Cystic duct was patent.  General  surgery did offer patient cholecystectomy, however after discussing patient would like to avoid surgeries.  Her diet was advanced, she is now tolerating a regular diet, LFTs improving and she will be discharged home in stable condition with 5 additional days of Augmentin .   Active problems Hypokalemia-continue to monitor and replace potassium as indicated PAF-continue metoprolol , Xarelto  now on hold, anticoagulation to be resumed 4/26 per gastroenterology Transient hypotension, history of falls-continue midodrine   Sepsis ruled out   Discharge Instructions   Allergies as of 04/14/2024       Reactions   Sulfa Antibiotics Nausea And Vomiting   Pt states only in tablet form        Medication List     PAUSE taking these medications    Xarelto  20 MG Tabs tablet Wait to take this until: April 16, 2024 Generic drug: rivaroxaban  TAKE 1 TABLET BY MOUTH EVERY DAY       TAKE these medications    acetaminophen  500 MG tablet Commonly known as: TYLENOL  Take 500 mg by mouth every 6 (six) hours as needed for mild pain (pain score 1-3).   amoxicillin -clavulanate 875-125 MG tablet Commonly known as: AUGMENTIN  Take 1 tablet by mouth 2 (two) times daily for 5 days.   Cholecalciferol 50 MCG (2000 UT) Tabs Take by mouth.   metoprolol  tartrate 25 MG tablet Commonly known as: LOPRESSOR  Take 0.5 tablets (12.5 mg total) by mouth 2 (two) times daily.   midodrine  5 MG tablet Commonly known as: PROAMATINE  Take 1 tablet (5 mg total) by mouth in the morning and at bedtime. Take 5 mg at 8AM and 5mg  at [facility]   omeprazole   20 MG capsule Commonly known as: PRILOSEC Take 20 mg by mouth daily.        Follow-up Information     Care, [facility] Follow up.   Specialty: Home Health Services Why: Home health has been arranged. They will contact you within 48 hours post discharge to schedule apt. Contact information: [address] [place] [address] [phone]                  Consultations: GI Surgery   Procedures/Studies:  DG ERCP Result Date: 04/13/2024 CLINICAL DATA:  Bile duct stones.  Abnormal LFTs. EXAM: ERCP TECHNIQUE: Multiple spot images obtained with the fluoroscopic device and submitted for interpretation post-procedure. FLUOROSCOPY: Radiation Exposure Index (as provided by the fluoroscopic device): 6.31 mGy Kerma COMPARISON:  MRI 04/11/2024 FINDINGS: Endoscope in the proximal duodenum and evidence for balloon dilatation of the proximal duodenum. Retrograde cholangiogram demonstrates dilatation of the extrahepatic bile duct with small filling defects that are suggestive for stones. Evidence for a balloon sweep and evidence for balloon dilatation in the distal common bile duct region. Small amount of contrast fills the gallbladder. IMPRESSION: 1. Balloon dilatation in the proximal duodenum. 2. Choledocholithiasis and stone removal. These images were submitted for radiologic interpretation only. Please see the procedural report. Electronically Signed   By: Elene Griffes M.D.   On: 04/13/2024 14:16   DG C-Arm 1-60 Min-No Report Result Date: 04/13/2024 Fluoroscopy was utilized by the requesting physician.  No radiographic interpretation.   MR ABDOMEN MRCP W WO CONTAST Result Date: 04/11/2024 CLINICAL DATA:  Cholelithiasis; Cholelithiasis 92631 Cholecystitis 92631 EXAM: MRI ABDOMEN WITHOUT AND WITH CONTRAST (INCLUDING MRCP) TECHNIQUE: Multiplanar multisequence MR imaging of the abdomen was performed both before and after the administration of intravenous contrast. Heavily T2-weighted images of the biliary and pancreatic ducts were obtained, and three-dimensional MRCP images were rendered by post processing. CONTRAST:  6mL GADAVIST  GADOBUTROL  1 MMOL/ML IV SOLN COMPARISON:  Abdominal ultrasound 04/10/2024. Abdominal CT 11/19/2023. FINDINGS: Technical note: Despite efforts by the technologist and patient, mild motion artifact is present on today's exam and  could not be eliminated. This reduces exam sensitivity and specificity. Lower chest: Dependent opacities at both lung bases appear increased from previous CT, possibly due to progressive fibrosis or superimposed atelectasis/inflammation. Hepatobiliary: The liver has a non cirrhotic morphology, without significant steatosis. No focal liver lesions identified. Multiple small gallstones are noted with mild gallbladder distension and mild pericholecystic fluid. No significant intrahepatic biliary dilatation. There is mild extrahepatic biliary dilatation with the common hepatic duct measuring 6 mm in diameter and the common bile duct 10 mm. There are small intraductal stones within the distal common bile duct, best seen on the coronal images. Pancreas: Diffuse fatty replacement as on previous CT. No pancreatic ductal dilatation or surrounding inflammation. Spleen: Normal in size without focal abnormality. Adrenals/Urinary Tract: Both adrenal glands appear normal. No evidence of enhancing renal mass, hydronephrosis or asymmetric perinephric soft tissue stranding. There are simple renal cysts bilaterally for which no specific follow-up imaging is recommended. Stomach/Bowel: The stomach appears unremarkable for its degree of distension. No evidence of bowel wall thickening, distention or surrounding inflammatory change. Vascular/Lymphatic: Stable prominent lymph nodes in the porta hepatis, likely reactive. No acute vascular findings. There is mild aortoiliac tortuosity. Other: No significant ascites or focal extraluminal fluid collection. Musculoskeletal: Multiple lumbar compression deformities are grossly unchanged from previous CT. IMPRESSION: 1. Mild extrahepatic biliary dilatation with small intraductal stones in the distal common bile duct. 2. Cholelithiasis  with mild gallbladder distension and mild pericholecystic fluid, suspicious for early acute cholecystitis, not demonstrated on ultrasound yesterday. Correlate  clinically. 3. No other acute abdominal findings. 4. Dependent opacities at both lung bases appear increased from previous CT, possibly due to progressive fibrosis or superimposed atelectasis/inflammation. Recommend chest radiographic correlation. Electronically Signed   By: Elmon Hagedorn M.D.   On: 04/11/2024 11:19   MR 3D Recon At Scanner Result Date: 04/11/2024 CLINICAL DATA:  Cholelithiasis; Cholelithiasis 92631 Cholecystitis 92631 EXAM: MRI ABDOMEN WITHOUT AND WITH CONTRAST (INCLUDING MRCP) TECHNIQUE: Multiplanar multisequence MR imaging of the abdomen was performed both before and after the administration of intravenous contrast. Heavily T2-weighted images of the biliary and pancreatic ducts were obtained, and three-dimensional MRCP images were rendered by post processing. CONTRAST:  6mL GADAVIST  GADOBUTROL  1 MMOL/ML IV SOLN COMPARISON:  Abdominal ultrasound 04/10/2024. Abdominal CT 11/19/2023. FINDINGS: Technical note: Despite efforts by the technologist and patient, mild motion artifact is present on today's exam and could not be eliminated. This reduces exam sensitivity and specificity. Lower chest: Dependent opacities at both lung bases appear increased from previous CT, possibly due to progressive fibrosis or superimposed atelectasis/inflammation. Hepatobiliary: The liver has a non cirrhotic morphology, without significant steatosis. No focal liver lesions identified. Multiple small gallstones are noted with mild gallbladder distension and mild pericholecystic fluid. No significant intrahepatic biliary dilatation. There is mild extrahepatic biliary dilatation with the common hepatic duct measuring 6 mm in diameter and the common bile duct 10 mm. There are small intraductal stones within the distal common bile duct, best seen on the coronal images. Pancreas: Diffuse fatty replacement as on previous CT. No pancreatic ductal dilatation or surrounding inflammation. Spleen: Normal in size without focal  abnormality. Adrenals/Urinary Tract: Both adrenal glands appear normal. No evidence of enhancing renal mass, hydronephrosis or asymmetric perinephric soft tissue stranding. There are simple renal cysts bilaterally for which no specific follow-up imaging is recommended. Stomach/Bowel: The stomach appears unremarkable for its degree of distension. No evidence of bowel wall thickening, distention or surrounding inflammatory change. Vascular/Lymphatic: Stable prominent lymph nodes in the porta hepatis, likely reactive. No acute vascular findings. There is mild aortoiliac tortuosity. Other: No significant ascites or focal extraluminal fluid collection. Musculoskeletal: Multiple lumbar compression deformities are grossly unchanged from previous CT. IMPRESSION: 1. Mild extrahepatic biliary dilatation with small intraductal stones in the distal common bile duct. 2. Cholelithiasis with mild gallbladder distension and mild pericholecystic fluid, suspicious for early acute cholecystitis, not demonstrated on ultrasound yesterday. Correlate clinically. 3. No other acute abdominal findings. 4. Dependent opacities at both lung bases appear increased from previous CT, possibly due to progressive fibrosis or superimposed atelectasis/inflammation. Recommend chest radiographic correlation. Electronically Signed   By: Elmon Hagedorn M.D.   On: 04/11/2024 11:19   US  Abdomen Limited RUQ (LIVER/GB) Result Date: 04/10/2024 CLINICAL DATA:  Abdominal pain EXAM: ULTRASOUND ABDOMEN LIMITED RIGHT UPPER QUADRANT COMPARISON:  CT abdomen pelvis 11/19/2023 FINDINGS: Gallbladder: Cholelithiasis. No wall thickening or pericholecystic fluid. Negative sonographic Murphy's sign. Common bile duct: Diameter: 8 mm. Liver: No focal lesion identified. Increased parenchymal echogenicity. Portal vein is patent on color Doppler imaging with normal direction of blood flow towards the liver. Other: None. IMPRESSION: 1. Cholelithiasis without evidence of acute  cholecystitis. 2. Hepatic steatosis. Electronically Signed   By: Rozell Cornet M.D.   On: 04/10/2024 19:57   DG Abd Acute W/Chest Result Date: 04/10/2024 CLINICAL DATA:  Vomiting EXAM: DG ABDOMEN ACUTE WITH 1 VIEW CHEST COMPARISON:  11/19/2023 FINDINGS: Heart size and  mediastinal contours are within normal limits. Lungs are clear. No effusion. No free air. Normal bowel gas pattern. There are no abnormal calcifications. Spondylitic changes in the lumbar spine. Left femoral neck fixation hardware partially visualized. IMPRESSION: No acute findings. Electronically Signed   By: Nicoletta Barrier M.D.   On: 04/10/2024 18:27     Subjective: - no chest pain, shortness of breath, no abdominal pain, nausea or vomiting.   Discharge Exam: BP 113/77   Pulse (!) 54   Temp 97.6 F (36.4 C)   Resp 18   Ht 5\' 5"  (1.651 m)   Wt 68 kg   SpO2 94%   BMI 24.96 kg/m   General: Pt is alert, awake, not in acute distress Cardiovascular: RRR, S1/S2 +, no rubs, no gallops Respiratory: CTA bilaterally, no wheezing, no rhonchi Abdominal: Soft, NT, ND, bowel sounds + Extremities: no edema, no cyanosis    The results of significant diagnostics from this hospitalization (including imaging, microbiology, ancillary and laboratory) are listed below for reference.     Microbiology: Recent Results (from the past 240 hours)  Blood culture (routine x 2)     Status: None (Preliminary result)   Collection Time: 04/10/24  8:38 PM   Specimen: BLOOD  Result Value Ref Range Status   Specimen Description BLOOD BLOOD RIGHT WRIST  Final   Special Requests   Final    BOTTLES DRAWN AEROBIC ONLY Blood Culture results may not be optimal due to an inadequate volume of blood received in culture bottles   Culture   Final    NO GROWTH 4 DAYS Performed at [facility], [address], [place], [place] [address]    Report Status PENDING  Incomplete  Blood culture (routine x 2)     Status: None (Preliminary result)    Collection Time: 04/10/24  8:47 PM   Specimen: BLOOD LEFT ARM  Result Value Ref Range Status   Specimen Description BLOOD LEFT ARM  Final   Special Requests   Final    BOTTLES DRAWN AEROBIC AND ANAEROBIC Blood Culture adequate volume   Culture   Final    NO GROWTH 4 DAYS Performed at [facility], [address], [place], [place] [address]    Report Status PENDING  Incomplete     Labs: Basic Metabolic Panel: Recent Labs  Lab 04/10/24 1710 04/11/24 0612 04/12/24 0704 04/13/24 0453 04/14/24 0615  NA 140 141 138 138 138  K 3.6 3.7 3.3* 3.6 4.0  CL 108 108 109 107 108  CO2 20* 25 23 22  20*  GLUCOSE 135* 97 89 94 136*  BUN 15 17 15 10 11   CREATININE 1.31* 1.05* 0.82 0.72 0.66  CALCIUM  8.9 8.6* 8.5* 8.5* 8.4*   Liver Function Tests: Recent Labs  Lab 04/10/24 1710 04/11/24 0612 04/12/24 0704 04/13/24 0453 04/14/24 0615  AST 628* 399* 237* 243* 109*  ALT 219* 178* 130* 113* 80*  ALKPHOS 216* 182* 168* 309* 264*  BILITOT 4.1* 3.8* 1.7* 1.7* 1.0  PROT 5.6* 5.3* 5.2* 5.1* 5.1*  ALBUMIN 2.7* 2.4* 2.3* 2.4* 2.2*   CBC: Recent Labs  Lab 04/10/24 1710 04/11/24 0612 04/12/24 0704 04/13/24 0453 04/14/24 0615  WBC 13.4* 11.9* 7.4 7.7 7.1  NEUTROABS 11.6*  --  4.5  --   --   HGB 14.4 13.3 12.6 13.0 12.9  HCT 43.7 39.5 37.6 38.8 38.1  MCV 106.8* 105.1* 104.7* 103.2* 105.0*  PLT 180 175 159 164 173   CBG: No  results for input(s): "GLUCAP" in the last 168 hours. Hgb A1c No results for input(s): "HGBA1C" in the last 72 hours. Lipid Profile No results for input(s): "CHOL", "HDL", "LDLCALC", "TRIG", "CHOLHDL", "LDLDIRECT" in the last 72 hours. Thyroid  function studies No results for input(s): "TSH", "T4TOTAL", "T3FREE", "THYROIDAB" in the last 72 hours.  Invalid input(s): "FREET3" Urinalysis    Component Value Date/Time   COLORURINE YELLOW 11/19/2023 1850   APPEARANCEUR CLEAR 11/19/2023 1850   LABSPEC 1.028 11/19/2023 1850   PHURINE 5.0 11/19/2023 1850    GLUCOSEU NEGATIVE 11/19/2023 1850   HGBUR NEGATIVE 11/19/2023 1850   BILIRUBINUR NEGATIVE 11/19/2023 1850   KETONESUR NEGATIVE 11/19/2023 1850   PROTEINUR NEGATIVE 11/19/2023 1850   NITRITE NEGATIVE 11/19/2023 1850   LEUKOCYTESUR NEGATIVE 11/19/2023 1850    FURTHER DISCHARGE INSTRUCTIONS:   Get Medicines reviewed and adjusted: Please take all your medications with you for your next visit with your Primary MD   Laboratory/radiological data: Please request your Primary MD to go over all hospital tests and procedure/radiological results at the follow up, please ask your Primary MD to get all Hospital records sent to his/her office.   In some cases, they will be blood work, cultures and biopsy results pending at the time of your discharge. Please request that your primary care M.D. goes through all the records of your hospital data and follows up on these results.   Also Note the following: If you experience worsening of your admission symptoms, develop shortness of breath, life threatening emergency, suicidal or homicidal thoughts you must seek medical attention immediately by calling 911 or calling your MD immediately  if symptoms less severe.   You must read complete instructions/literature along with all the possible adverse reactions/side effects for all the Medicines you take and that have been prescribed to you. Take any new Medicines after you have completely understood and accpet all the possible adverse reactions/side effects.    Do not drive when taking Pain medications or sleeping medications (Benzodaizepines)   Do not take more than prescribed Pain, Sleep and Anxiety Medications. It is not advisable to combine anxiety,sleep and pain medications without talking with your primary care practitioner   Special Instructions: If you have smoked or chewed Tobacco  in the last 2 yrs please stop smoking, stop any regular Alcohol  and or any Recreational drug use.   Wear Seat belts while  driving.   Please note: You were cared for by a hospitalist during your hospital stay. Once you are discharged, your primary care physician will handle any further medical issues. Please note that NO REFILLS for any discharge medications will be authorized once you are discharged, as it is imperative that you return to your primary care physician (or establish a relationship with a primary care physician if you do not have one) for your post hospital discharge needs so that they can reassess your need for medications and monitor your lab values.  Time coordinating discharge: 40 minutes  SIGNED:  Kathlen Para, MD, PhD 04/14/2024, 1:42 PM

## 2024-04-14 NOTE — Plan of Care (Signed)

## 2024-04-14 NOTE — Progress Notes (Signed)
 Physical Therapy Treatment Patient Details Name: Stacy Valencia MRN: [id number] DOB: October 07, 1933 Today's Date: 04/14/2024   History of Present Illness Pt is 89 year old presented to [facility] on  04/10/24 for N/V. Pt with cholelithiasis. PMH - afib, UTI, falls, osteoporosis, PE, GERD, L hip IM nail 2021.    PT Comments  Pt unwilling to participate w/ mobility.  Pt pleasant and agreeable to there ex in bed.  Pt states fatigued from procedure yesterday.  Pt states "may be going home this afternoon if can keep food down".  Pt encouraged to increase mobility, but unwilling.  Pt remained supine in bed w/ all needs in reach, bed alarm on.    If plan is discharge home, recommend the following: A little help with walking and/or transfers;A little help with bathing/dressing/bathroom;Assist for transportation;Help with stairs or ramp for entrance   Can travel by private vehicle        Equipment Recommendations       Recommendations for Other Services       Precautions / Restrictions Precautions Precautions: Fall Restrictions Weight Bearing Restrictions Per Provider Order: No     Mobility  Bed Mobility                     Tilt Bed    Modified Rankin (Stroke Patients Only)          Communication    Cognition Arousal: Alert                             PT - Cognition Comments: Pt states may be going home this PM "if can keep food down", but pt unwilling to perform mobility 2/2 fatigue.        Cueing    Exercises General Exercises - Upper Extremity Shoulder Flexion: AROM, 15 reps (x 2 sets.) General Exercises - Lower Extremity Ankle Circles/Pumps: AROM, 15 reps (x 3 sets) Heel Slides: AROM, 15 reps (x 3 sets) Hip ABduction/ADduction: AROM, 15 reps (x 3 sets)    General Comments        Pertinent Vitals/Pain Pain Assessment Pain Assessment: No/denies pain Pain Score: 0-No pain    Home Living                          Prior Function             PT Goals (current goals can now be found in the care plan section) Acute Rehab PT Goals Patient Stated Goal: return home Time For Goal Achievement: 04/25/24 Potential to Achieve Goals: Good Progress towards PT goals:  (pt not willing to mobilize.)    Frequency           PT Plan      Co-evaluation              AM-PAC PT "6 Clicks" Mobility   Outcome Measure  Help needed turning from your back to your side while in a flat bed without using bedrails?: None Help needed moving from lying on your back to sitting on the side of a flat bed without using bedrails?: A Little Help needed moving to and from a bed to a chair (including a wheelchair)?: A Little Help needed standing up from a chair using your arms (e.g., wheelchair or bedside chair)?: A Lot Help needed to walk in hospital room?: A Little Help needed climbing 3-5 steps with a railing? : Total  6 Click Score: 16    End of Session   Activity Tolerance: Patient limited by fatigue Patient left: in bed;with call bell/phone within reach;with bed alarm set Nurse Communication: Mobility status PT Visit Diagnosis: Unsteadiness on feet (R26.81);Other abnormalities of gait and mobility (R26.89);Muscle weakness (generalized) (M62.81);Pain     Time: 2956-2130 PT Time Calculation (min) (ACUTE ONLY): 14 min  Charges:    $Therapeutic Exercise: 8-22 mins PT General Charges $$ ACUTE PT VISIT: 1 Visit                     Odessia Benedict, PT    Odessia Benedict 04/14/2024, 11:37 AM

## 2024-04-14 NOTE — Care Management Important Message (Signed)
 Important Message  Patient Details  Name: [name] MRN: [id number] Date of Birth: Nov 21, 1933   Important Message Given:  Yes - Medicare IM     Wynonia Hedges 04/14/2024, 11:29 AM

## 2024-04-14 NOTE — TOC Transition Note (Addendum)
 Transition of Care [facility]) - Discharge Note   Patient Details  Name: Stacy Valencia MRN: [id number] Date of Birth: 23-Sep-1933  [facility] CM/SW Contact:  Jeani Mill, RN Phone Number: 04/14/2024, 2:39 PM   Clinical Narrative:    Patient stable for discharge.  PTAR notified.  Cory with [facility] notified of discharge.    Final next level of care: Home w Home Health Services Barriers to Discharge: Barriers Resolved   Patient Goals and CMS Choice Patient states their goals for this hospitalization and ongoing recovery are:: return home with spouse CMS Medicare.gov Compare Post Acute Care list provided to:: Patient Choice offered to / list presented to : Patient      Discharge Placement                   Home    Discharge Plan and Services Additional resources added to the After Visit Summary for     Discharge Planning Services: CM Consult Post Acute Care Choice: Home Health                    HH Arranged: RN, PT, OT [facility] Agency: [facility] Date [facility] Agency Contacted: 04/13/24 Time HH Agency Contacted: 1201 Representative spoke with at [facility] Agency: Randel Buss  Social Drivers of Health (SDOH) Interventions SDOH Screenings   Food Insecurity: No Food Insecurity (04/10/2024)  Housing: Low Risk  (04/10/2024)  Transportation Needs: No Transportation Needs (04/10/2024)  Utilities: Not At Risk (04/10/2024)  Social Connections: Moderately Integrated (04/10/2024)  Tobacco Use: Low Risk  (04/11/2024)     Readmission Risk Interventions    04/14/2024    2:39 PM  Readmission Risk Prevention Plan  Post Dischage Appt Complete  Medication Screening Complete  Transportation Screening Complete

## 2024-04-14 NOTE — Progress Notes (Signed)
 1 Day Post-Op  Subjective: No complaints following ERCP except fatigue. Tolerating FLD this morning, no nausea  Objective: Vital signs in last 24 hours: Temp:  [97 F (36.1 C)-97.6 F (36.4 C)] 97.6 F (36.4 C) (04/24 0730) Pulse Rate:  [50-90] 54 (04/24 0730) Resp:  [16-25] 18 (04/24 0730) BP: (104-121)/(52-77) 113/77 (04/24 0730) SpO2:  [93 %-96 %] 94 % (04/24 0730) Last BM Date : 04/12/24  Intake/Output from previous day: 04/23 0701 - 04/24 0700 In: 800 [I.V.:800] Out: -  Intake/Output this shift: No intake/output data recorded.  PE: Gen:  Alert, NAD, pleasant Abd: Soft, ND,NT, +BS   Lab Results:  Recent Labs    04/13/24 0453 04/14/24 0615  WBC 7.7 7.1  HGB 13.0 12.9  HCT 38.8 38.1  PLT 164 173   BMET Recent Labs    04/13/24 0453 04/14/24 0615  NA 138 138  K 3.6 4.0  CL 107 108  CO2 22 20*  GLUCOSE 94 136*  BUN 10 11  CREATININE 0.72 0.66  CALCIUM  8.5* 8.4*   PT/INR No results for input(s): "LABPROT", "INR" in the last 72 hours. CMP     Component Value Date/Time   NA 138 04/14/2024 0615   NA 138 12/20/2013 0952   K 4.0 04/14/2024 0615   K 4.3 12/20/2013 0952   CL 108 04/14/2024 0615   CL 106 01/27/2013 1033   CO2 20 (L) 04/14/2024 0615   CO2 22 12/20/2013 0952   GLUCOSE 136 (H) 04/14/2024 0615   GLUCOSE 83 12/20/2013 0952   GLUCOSE 109 (H) 01/27/2013 1033   BUN 11 04/14/2024 0615   BUN 20.8 12/20/2013 0952   CREATININE 0.66 04/14/2024 0615   CREATININE 1.0 12/20/2013 0952   CALCIUM  8.4 (L) 04/14/2024 0615   CALCIUM  9.7 12/20/2013 0952   PROT 5.1 (L) 04/14/2024 0615   PROT 7.1 12/20/2013 0952   ALBUMIN 2.2 (L) 04/14/2024 0615   ALBUMIN 3.6 12/20/2013 0952   AST 109 (H) 04/14/2024 0615   AST 21 12/20/2013 0952   ALT 80 (H) 04/14/2024 0615   ALT 20 12/20/2013 0952   ALKPHOS 264 (H) 04/14/2024 0615   ALKPHOS 84 12/20/2013 0952   BILITOT 1.0 04/14/2024 0615   BILITOT 0.54 12/20/2013 0952   GFRNONAA >60 04/14/2024 0615   GFRAA  >60 08/06/2020 0450   Lipase     Component Value Date/Time   LIPASE 18 04/14/2024 0615    Studies/Results: DG ERCP Result Date: 04/13/2024 CLINICAL DATA:  Bile duct stones.  Abnormal LFTs. EXAM: ERCP TECHNIQUE: Multiple spot images obtained with the fluoroscopic device and submitted for interpretation post-procedure. FLUOROSCOPY: Radiation Exposure Index (as provided by the fluoroscopic device): 6.31 mGy Kerma COMPARISON:  MRI 04/11/2024 FINDINGS: Endoscope in the proximal duodenum and evidence for balloon dilatation of the proximal duodenum. Retrograde cholangiogram demonstrates dilatation of the extrahepatic bile duct with small filling defects that are suggestive for stones. Evidence for a balloon sweep and evidence for balloon dilatation in the distal common bile duct region. Small amount of contrast fills the gallbladder. IMPRESSION: 1. Balloon dilatation in the proximal duodenum. 2. Choledocholithiasis and stone removal. These images were submitted for radiologic interpretation only. Please see the procedural report. Electronically Signed   By: Elene Griffes M.D.   On: 04/13/2024 14:16   DG C-Arm 1-60 Min-No Report Result Date: 04/13/2024 Fluoroscopy was utilized by the requesting physician.  No radiographic interpretation.    Anti-infectives: Anti-infectives (From admission, onward)    Start  Dose/Rate Route Frequency Ordered Stop   04/11/24 1000  cefTRIAXone  (ROCEPHIN ) 2 g in sodium chloride  0.9 % 100 mL IVPB        2 g 200 mL/hr over 30 Minutes Intravenous Every 24 hours 04/10/24 2208 04/18/24 0959   04/10/24 2230  metroNIDAZOLE  (FLAGYL ) IVPB 500 mg        500 mg 100 mL/hr over 60 Minutes Intravenous Every 12 hours 04/10/24 2208 04/17/24 2159   04/10/24 2015  cefTRIAXone  (ROCEPHIN ) 1 g in sodium chloride  0.9 % 100 mL IVPB        1 g 200 mL/hr over 30 Minutes Intravenous  Once 04/10/24 2005 04/10/24 2232        Assessment/Plan Choledocholithiasis  - ERCP reviewed with  stone extraction.  Contrast injection shows a patent cystic duct - No current clinical signs of Cholecystitis. She is afebrile with normal wbc and non-tender.  - discussed surgery vs no surgery with the patient given she does not have cholecystitis and also now has a sphincterotomy.  The patient very much so would like to avoid surgery unless she develops an acute infection.  I think this is very reasonable given her age/co-morbidities.   -regular diet has been ordered.  D/w primary service. -we will sign off at this time.  She may follow up in our office prn  FEN - regular VTE - Xarelto  can be resumed 4/26 per GI ID - Rocephin /Flagyl  per primary/GI   A fib H/O DVT/PE GERD Transient hypotension Adjustment disorder Frequent falls  I reviewed nursing notes, Consultant (GI) notes, hospitalist notes, last 24 h vitals and pain scores, last 48 h intake and output, last 24 h labs and trends, and last 24 h imaging results.    LOS: 4 days    Stacy Valencia, [facility] 04/14/2024, 11:16 AM Please see Amion for pager number during day hours 7:00am-4:30pm

## 2024-04-14 NOTE — Discharge Instructions (Addendum)
 Discharged instructions were given in full details.  Patient verbalized understanding.

## 2024-04-14 NOTE — Progress Notes (Addendum)
 Mobility Specialist Progress Note:   04/14/24 1342  Mobility  Activity Refused mobility (bed exercises)  Range of Motion/Exercises All extremities;Active  Activity Response Tolerated well  Mobility Referral Yes  Mobility visit 1 Mobility  Mobility Specialist Start Time (ACUTE ONLY) 1130  Mobility Specialist Stop Time (ACUTE ONLY) 1140  Mobility Specialist Time Calculation (min) (ACUTE ONLY) 10 min   Pt received in bed, pleasantly refusing OOB activity d/t fatigue but willing to do bed level exercises. Pt c/o RLE stiffness, otherwise asx throughout. Pt left in bed with call bell in reach and all needs met.   Stacy Valencia  Mobility Specialist Please contact via [employer] office at [phone]

## 2024-04-15 LAB — CULTURE, BLOOD (ROUTINE X 2)
Culture: NO GROWTH
Culture: NO GROWTH
Special Requests: ADEQUATE

## 2024-04-15 NOTE — Addendum Note (Signed)
 Addendum  created 04/15/24 0406 by Rhenda Cedars, MD   Clinical Note Signed

## 2024-04-15 NOTE — Anesthesia Postprocedure Evaluation (Signed)
 Anesthesia Post Note  Patient: Stacy Valencia  Procedure(s) Performed: ERCP, WITH INTERVENTION IF INDICATED DILATION, STRICTURE, BILE DUCT     Patient location during evaluation: PACU Anesthesia Type: General Level of consciousness: awake and alert Pain management: pain level controlled Vital Signs Assessment: post-procedure vital signs reviewed and stable Respiratory status: spontaneous breathing, nonlabored ventilation, respiratory function stable and patient connected to nasal cannula oxygen  Cardiovascular status: blood pressure returned to baseline and stable Postop Assessment: no apparent nausea or vomiting Anesthetic complications: no   No notable events documented.  Last Vitals:  Vitals:   04/14/24 0356 04/14/24 0730  BP: (!) 120/57 113/77  Pulse: (!) 50 (!) 54  Resp: 16 18  Temp: 36.4 C 36.4 C  SpO2: 93% 94%    Last Pain:  Vitals:   04/13/24 2125  TempSrc:   PainSc: 0-No pain   Pain Goal: Patients Stated Pain Goal: 2 (04/12/24 0810)                 Cheryl Stabenow

## 2024-04-18 DIAGNOSIS — I480 Paroxysmal atrial fibrillation: Secondary | ICD-10-CM | POA: Diagnosis not present

## 2024-04-18 DIAGNOSIS — E119 Type 2 diabetes mellitus without complications: Secondary | ICD-10-CM | POA: Diagnosis not present

## 2024-04-18 DIAGNOSIS — I700 Atherosclerosis of aorta: Secondary | ICD-10-CM | POA: Diagnosis not present

## 2024-04-20 DIAGNOSIS — E785 Hyperlipidemia, unspecified: Secondary | ICD-10-CM | POA: Diagnosis not present

## 2024-04-20 DIAGNOSIS — M810 Age-related osteoporosis without current pathological fracture: Secondary | ICD-10-CM | POA: Diagnosis not present

## 2024-04-20 DIAGNOSIS — I480 Paroxysmal atrial fibrillation: Secondary | ICD-10-CM | POA: Diagnosis not present

## 2024-04-25 DIAGNOSIS — I480 Paroxysmal atrial fibrillation: Secondary | ICD-10-CM | POA: Diagnosis not present

## 2024-04-25 DIAGNOSIS — K760 Fatty (change of) liver, not elsewhere classified: Secondary | ICD-10-CM | POA: Diagnosis not present

## 2024-04-25 DIAGNOSIS — F4322 Adjustment disorder with anxiety: Secondary | ICD-10-CM | POA: Diagnosis not present

## 2024-04-25 DIAGNOSIS — Z7901 Long term (current) use of anticoagulants: Secondary | ICD-10-CM | POA: Diagnosis not present

## 2024-04-25 DIAGNOSIS — Z48815 Encounter for surgical aftercare following surgery on the digestive system: Secondary | ICD-10-CM | POA: Diagnosis not present

## 2024-04-25 DIAGNOSIS — Z86711 Personal history of pulmonary embolism: Secondary | ICD-10-CM | POA: Diagnosis not present

## 2024-04-25 DIAGNOSIS — I9589 Other hypotension: Secondary | ICD-10-CM | POA: Diagnosis not present

## 2024-04-25 DIAGNOSIS — Z86718 Personal history of other venous thrombosis and embolism: Secondary | ICD-10-CM | POA: Diagnosis not present

## 2024-04-25 DIAGNOSIS — E876 Hypokalemia: Secondary | ICD-10-CM | POA: Diagnosis not present

## 2024-04-25 DIAGNOSIS — Z9181 History of falling: Secondary | ICD-10-CM | POA: Diagnosis not present

## 2024-04-25 DIAGNOSIS — K219 Gastro-esophageal reflux disease without esophagitis: Secondary | ICD-10-CM | POA: Diagnosis not present

## 2024-04-25 DIAGNOSIS — N179 Acute kidney failure, unspecified: Secondary | ICD-10-CM | POA: Diagnosis not present

## 2024-04-27 DIAGNOSIS — I480 Paroxysmal atrial fibrillation: Secondary | ICD-10-CM | POA: Diagnosis not present

## 2024-04-27 DIAGNOSIS — K219 Gastro-esophageal reflux disease without esophagitis: Secondary | ICD-10-CM | POA: Diagnosis not present

## 2024-04-27 DIAGNOSIS — Z48815 Encounter for surgical aftercare following surgery on the digestive system: Secondary | ICD-10-CM | POA: Diagnosis not present

## 2024-04-27 DIAGNOSIS — K760 Fatty (change of) liver, not elsewhere classified: Secondary | ICD-10-CM | POA: Diagnosis not present

## 2024-04-27 DIAGNOSIS — N179 Acute kidney failure, unspecified: Secondary | ICD-10-CM | POA: Diagnosis not present

## 2024-04-27 DIAGNOSIS — E876 Hypokalemia: Secondary | ICD-10-CM | POA: Diagnosis not present

## 2024-05-02 DIAGNOSIS — K8050 Calculus of bile duct without cholangitis or cholecystitis without obstruction: Secondary | ICD-10-CM | POA: Diagnosis not present

## 2024-05-02 DIAGNOSIS — I950 Idiopathic hypotension: Secondary | ICD-10-CM | POA: Diagnosis not present

## 2024-05-02 DIAGNOSIS — Z09 Encounter for follow-up examination after completed treatment for conditions other than malignant neoplasm: Secondary | ICD-10-CM | POA: Diagnosis not present

## 2024-05-04 DIAGNOSIS — K219 Gastro-esophageal reflux disease without esophagitis: Secondary | ICD-10-CM | POA: Diagnosis not present

## 2024-05-04 DIAGNOSIS — I480 Paroxysmal atrial fibrillation: Secondary | ICD-10-CM | POA: Diagnosis not present

## 2024-05-04 DIAGNOSIS — Z48815 Encounter for surgical aftercare following surgery on the digestive system: Secondary | ICD-10-CM | POA: Diagnosis not present

## 2024-05-04 DIAGNOSIS — N179 Acute kidney failure, unspecified: Secondary | ICD-10-CM | POA: Diagnosis not present

## 2024-05-04 DIAGNOSIS — K760 Fatty (change of) liver, not elsewhere classified: Secondary | ICD-10-CM | POA: Diagnosis not present

## 2024-05-04 DIAGNOSIS — E876 Hypokalemia: Secondary | ICD-10-CM | POA: Diagnosis not present

## 2024-05-06 DIAGNOSIS — K760 Fatty (change of) liver, not elsewhere classified: Secondary | ICD-10-CM | POA: Diagnosis not present

## 2024-05-06 DIAGNOSIS — N179 Acute kidney failure, unspecified: Secondary | ICD-10-CM | POA: Diagnosis not present

## 2024-05-06 DIAGNOSIS — I480 Paroxysmal atrial fibrillation: Secondary | ICD-10-CM | POA: Diagnosis not present

## 2024-05-06 DIAGNOSIS — E876 Hypokalemia: Secondary | ICD-10-CM | POA: Diagnosis not present

## 2024-05-06 DIAGNOSIS — Z48815 Encounter for surgical aftercare following surgery on the digestive system: Secondary | ICD-10-CM | POA: Diagnosis not present

## 2024-05-06 DIAGNOSIS — K219 Gastro-esophageal reflux disease without esophagitis: Secondary | ICD-10-CM | POA: Diagnosis not present

## 2024-05-09 DIAGNOSIS — N179 Acute kidney failure, unspecified: Secondary | ICD-10-CM | POA: Diagnosis not present

## 2024-05-09 DIAGNOSIS — K760 Fatty (change of) liver, not elsewhere classified: Secondary | ICD-10-CM | POA: Diagnosis not present

## 2024-05-09 DIAGNOSIS — K219 Gastro-esophageal reflux disease without esophagitis: Secondary | ICD-10-CM | POA: Diagnosis not present

## 2024-05-09 DIAGNOSIS — Z48815 Encounter for surgical aftercare following surgery on the digestive system: Secondary | ICD-10-CM | POA: Diagnosis not present

## 2024-05-09 DIAGNOSIS — I480 Paroxysmal atrial fibrillation: Secondary | ICD-10-CM | POA: Diagnosis not present

## 2024-05-09 DIAGNOSIS — E876 Hypokalemia: Secondary | ICD-10-CM | POA: Diagnosis not present

## 2024-05-10 DIAGNOSIS — K760 Fatty (change of) liver, not elsewhere classified: Secondary | ICD-10-CM | POA: Diagnosis not present

## 2024-05-10 DIAGNOSIS — Z48815 Encounter for surgical aftercare following surgery on the digestive system: Secondary | ICD-10-CM | POA: Diagnosis not present

## 2024-05-10 DIAGNOSIS — N179 Acute kidney failure, unspecified: Secondary | ICD-10-CM | POA: Diagnosis not present

## 2024-05-10 DIAGNOSIS — I480 Paroxysmal atrial fibrillation: Secondary | ICD-10-CM | POA: Diagnosis not present

## 2024-05-10 DIAGNOSIS — K219 Gastro-esophageal reflux disease without esophagitis: Secondary | ICD-10-CM | POA: Diagnosis not present

## 2024-05-10 DIAGNOSIS — E876 Hypokalemia: Secondary | ICD-10-CM | POA: Diagnosis not present

## 2024-05-11 DIAGNOSIS — I480 Paroxysmal atrial fibrillation: Secondary | ICD-10-CM | POA: Diagnosis not present

## 2024-05-11 DIAGNOSIS — K219 Gastro-esophageal reflux disease without esophagitis: Secondary | ICD-10-CM | POA: Diagnosis not present

## 2024-05-11 DIAGNOSIS — Z48815 Encounter for surgical aftercare following surgery on the digestive system: Secondary | ICD-10-CM | POA: Diagnosis not present

## 2024-05-11 DIAGNOSIS — N179 Acute kidney failure, unspecified: Secondary | ICD-10-CM | POA: Diagnosis not present

## 2024-05-11 DIAGNOSIS — K760 Fatty (change of) liver, not elsewhere classified: Secondary | ICD-10-CM | POA: Diagnosis not present

## 2024-05-11 DIAGNOSIS — E876 Hypokalemia: Secondary | ICD-10-CM | POA: Diagnosis not present

## 2024-05-17 ENCOUNTER — Other Ambulatory Visit (HOSPITAL_COMMUNITY): Payer: Self-pay

## 2024-05-17 DIAGNOSIS — I480 Paroxysmal atrial fibrillation: Secondary | ICD-10-CM | POA: Diagnosis not present

## 2024-05-17 DIAGNOSIS — N179 Acute kidney failure, unspecified: Secondary | ICD-10-CM | POA: Diagnosis not present

## 2024-05-17 DIAGNOSIS — K760 Fatty (change of) liver, not elsewhere classified: Secondary | ICD-10-CM | POA: Diagnosis not present

## 2024-05-17 DIAGNOSIS — Z48815 Encounter for surgical aftercare following surgery on the digestive system: Secondary | ICD-10-CM | POA: Diagnosis not present

## 2024-05-17 DIAGNOSIS — K219 Gastro-esophageal reflux disease without esophagitis: Secondary | ICD-10-CM | POA: Diagnosis not present

## 2024-05-17 DIAGNOSIS — E876 Hypokalemia: Secondary | ICD-10-CM | POA: Diagnosis not present

## 2024-05-23 DIAGNOSIS — E876 Hypokalemia: Secondary | ICD-10-CM | POA: Diagnosis not present

## 2024-05-23 DIAGNOSIS — Z48815 Encounter for surgical aftercare following surgery on the digestive system: Secondary | ICD-10-CM | POA: Diagnosis not present

## 2024-05-23 DIAGNOSIS — K219 Gastro-esophageal reflux disease without esophagitis: Secondary | ICD-10-CM | POA: Diagnosis not present

## 2024-05-23 DIAGNOSIS — N179 Acute kidney failure, unspecified: Secondary | ICD-10-CM | POA: Diagnosis not present

## 2024-05-23 DIAGNOSIS — I700 Atherosclerosis of aorta: Secondary | ICD-10-CM | POA: Diagnosis not present

## 2024-05-23 DIAGNOSIS — I480 Paroxysmal atrial fibrillation: Secondary | ICD-10-CM | POA: Diagnosis not present

## 2024-05-23 DIAGNOSIS — E119 Type 2 diabetes mellitus without complications: Secondary | ICD-10-CM | POA: Diagnosis not present

## 2024-05-23 DIAGNOSIS — K760 Fatty (change of) liver, not elsewhere classified: Secondary | ICD-10-CM | POA: Diagnosis not present

## 2024-05-25 DIAGNOSIS — K760 Fatty (change of) liver, not elsewhere classified: Secondary | ICD-10-CM | POA: Diagnosis not present

## 2024-05-25 DIAGNOSIS — N179 Acute kidney failure, unspecified: Secondary | ICD-10-CM | POA: Diagnosis not present

## 2024-05-25 DIAGNOSIS — Z7901 Long term (current) use of anticoagulants: Secondary | ICD-10-CM | POA: Diagnosis not present

## 2024-05-25 DIAGNOSIS — Z86711 Personal history of pulmonary embolism: Secondary | ICD-10-CM | POA: Diagnosis not present

## 2024-05-25 DIAGNOSIS — I9589 Other hypotension: Secondary | ICD-10-CM | POA: Diagnosis not present

## 2024-05-25 DIAGNOSIS — F4322 Adjustment disorder with anxiety: Secondary | ICD-10-CM | POA: Diagnosis not present

## 2024-05-25 DIAGNOSIS — K219 Gastro-esophageal reflux disease without esophagitis: Secondary | ICD-10-CM | POA: Diagnosis not present

## 2024-05-25 DIAGNOSIS — Z9181 History of falling: Secondary | ICD-10-CM | POA: Diagnosis not present

## 2024-05-25 DIAGNOSIS — Z48815 Encounter for surgical aftercare following surgery on the digestive system: Secondary | ICD-10-CM | POA: Diagnosis not present

## 2024-05-25 DIAGNOSIS — E876 Hypokalemia: Secondary | ICD-10-CM | POA: Diagnosis not present

## 2024-05-25 DIAGNOSIS — Z86718 Personal history of other venous thrombosis and embolism: Secondary | ICD-10-CM | POA: Diagnosis not present

## 2024-05-25 DIAGNOSIS — I480 Paroxysmal atrial fibrillation: Secondary | ICD-10-CM | POA: Diagnosis not present

## 2024-05-30 DIAGNOSIS — K219 Gastro-esophageal reflux disease without esophagitis: Secondary | ICD-10-CM | POA: Diagnosis not present

## 2024-05-30 DIAGNOSIS — E876 Hypokalemia: Secondary | ICD-10-CM | POA: Diagnosis not present

## 2024-05-30 DIAGNOSIS — Z48815 Encounter for surgical aftercare following surgery on the digestive system: Secondary | ICD-10-CM | POA: Diagnosis not present

## 2024-05-30 DIAGNOSIS — I480 Paroxysmal atrial fibrillation: Secondary | ICD-10-CM | POA: Diagnosis not present

## 2024-05-30 DIAGNOSIS — N179 Acute kidney failure, unspecified: Secondary | ICD-10-CM | POA: Diagnosis not present

## 2024-05-30 DIAGNOSIS — K760 Fatty (change of) liver, not elsewhere classified: Secondary | ICD-10-CM | POA: Diagnosis not present

## 2024-06-05 ENCOUNTER — Other Ambulatory Visit: Payer: Self-pay

## 2024-06-05 ENCOUNTER — Inpatient Hospital Stay (HOSPITAL_COMMUNITY)
Admission: EM | Admit: 2024-06-05 | Discharge: 2024-06-10 | DRG: 689 | Disposition: A | Discharge status: Skilled Nursing Facility | Attending: Internal Medicine | Admitting: Internal Medicine

## 2024-06-05 DIAGNOSIS — K5730 Diverticulosis of large intestine without perforation or abscess without bleeding: Secondary | ICD-10-CM | POA: Diagnosis not present

## 2024-06-05 DIAGNOSIS — F4322 Adjustment disorder with anxiety: Secondary | ICD-10-CM | POA: Diagnosis present

## 2024-06-05 DIAGNOSIS — B961 Klebsiella pneumoniae [K. pneumoniae] as the cause of diseases classified elsewhere: Secondary | ICD-10-CM | POA: Diagnosis present

## 2024-06-05 DIAGNOSIS — N390 Urinary tract infection, site not specified: Principal | ICD-10-CM

## 2024-06-05 DIAGNOSIS — I2510 Atherosclerotic heart disease of native coronary artery without angina pectoris: Secondary | ICD-10-CM | POA: Diagnosis not present

## 2024-06-05 DIAGNOSIS — Z8279 Family history of other congenital malformations, deformations and chromosomal abnormalities: Secondary | ICD-10-CM

## 2024-06-05 DIAGNOSIS — J9811 Atelectasis: Secondary | ICD-10-CM | POA: Diagnosis present

## 2024-06-05 DIAGNOSIS — Z7901 Long term (current) use of anticoagulants: Secondary | ICD-10-CM | POA: Diagnosis not present

## 2024-06-05 DIAGNOSIS — M4854XA Collapsed vertebra, not elsewhere classified, thoracic region, initial encounter for fracture: Secondary | ICD-10-CM | POA: Diagnosis present

## 2024-06-05 DIAGNOSIS — W19XXXA Unspecified fall, initial encounter: Secondary | ICD-10-CM | POA: Diagnosis present

## 2024-06-05 DIAGNOSIS — D72829 Elevated white blood cell count, unspecified: Secondary | ICD-10-CM | POA: Diagnosis not present

## 2024-06-05 DIAGNOSIS — E860 Dehydration: Secondary | ICD-10-CM | POA: Diagnosis not present

## 2024-06-05 DIAGNOSIS — B9689 Other specified bacterial agents as the cause of diseases classified elsewhere: Secondary | ICD-10-CM | POA: Diagnosis present

## 2024-06-05 DIAGNOSIS — J984 Other disorders of lung: Secondary | ICD-10-CM | POA: Diagnosis not present

## 2024-06-05 DIAGNOSIS — E43 Unspecified severe protein-calorie malnutrition: Secondary | ICD-10-CM

## 2024-06-05 DIAGNOSIS — L89611 Pressure ulcer of right heel, stage 1: Secondary | ICD-10-CM | POA: Diagnosis present

## 2024-06-05 DIAGNOSIS — Z79899 Other long term (current) drug therapy: Secondary | ICD-10-CM

## 2024-06-05 DIAGNOSIS — I700 Atherosclerosis of aorta: Secondary | ICD-10-CM | POA: Diagnosis not present

## 2024-06-05 DIAGNOSIS — Z86711 Personal history of pulmonary embolism: Secondary | ICD-10-CM | POA: Diagnosis not present

## 2024-06-05 DIAGNOSIS — L89896 Pressure-induced deep tissue damage of other site: Secondary | ICD-10-CM | POA: Diagnosis not present

## 2024-06-05 DIAGNOSIS — E039 Hypothyroidism, unspecified: Secondary | ICD-10-CM | POA: Diagnosis present

## 2024-06-05 DIAGNOSIS — M4856XA Collapsed vertebra, not elsewhere classified, lumbar region, initial encounter for fracture: Secondary | ICD-10-CM | POA: Diagnosis present

## 2024-06-05 DIAGNOSIS — Y92009 Unspecified place in unspecified non-institutional (private) residence as the place of occurrence of the external cause: Secondary | ICD-10-CM

## 2024-06-05 DIAGNOSIS — Z86718 Personal history of other venous thrombosis and embolism: Secondary | ICD-10-CM | POA: Diagnosis not present

## 2024-06-05 DIAGNOSIS — Z882 Allergy status to sulfonamides status: Secondary | ICD-10-CM

## 2024-06-05 DIAGNOSIS — K219 Gastro-esophageal reflux disease without esophagitis: Secondary | ICD-10-CM | POA: Diagnosis not present

## 2024-06-05 DIAGNOSIS — R531 Weakness: Secondary | ICD-10-CM | POA: Diagnosis not present

## 2024-06-05 DIAGNOSIS — L89621 Pressure ulcer of left heel, stage 1: Secondary | ICD-10-CM | POA: Diagnosis present

## 2024-06-05 DIAGNOSIS — L89156 Pressure-induced deep tissue damage of sacral region: Secondary | ICD-10-CM | POA: Diagnosis not present

## 2024-06-05 DIAGNOSIS — Z1612 Extended spectrum beta lactamase (ESBL) resistance: Secondary | ICD-10-CM | POA: Diagnosis present

## 2024-06-05 DIAGNOSIS — L89891 Pressure ulcer of other site, stage 1: Secondary | ICD-10-CM | POA: Diagnosis not present

## 2024-06-05 DIAGNOSIS — Z7401 Bed confinement status: Secondary | ICD-10-CM | POA: Diagnosis not present

## 2024-06-05 DIAGNOSIS — L89326 Pressure-induced deep tissue damage of left buttock: Secondary | ICD-10-CM | POA: Diagnosis present

## 2024-06-05 DIAGNOSIS — L89316 Pressure-induced deep tissue damage of right buttock: Secondary | ICD-10-CM | POA: Diagnosis not present

## 2024-06-05 DIAGNOSIS — Z825 Family history of asthma and other chronic lower respiratory diseases: Secondary | ICD-10-CM

## 2024-06-05 DIAGNOSIS — Z90710 Acquired absence of both cervix and uterus: Secondary | ICD-10-CM

## 2024-06-05 DIAGNOSIS — R41841 Cognitive communication deficit: Secondary | ICD-10-CM | POA: Diagnosis not present

## 2024-06-05 DIAGNOSIS — Z79890 Hormone replacement therapy (postmenopausal): Secondary | ICD-10-CM | POA: Diagnosis not present

## 2024-06-05 DIAGNOSIS — Z811 Family history of alcohol abuse and dependence: Secondary | ICD-10-CM

## 2024-06-05 DIAGNOSIS — I959 Hypotension, unspecified: Secondary | ICD-10-CM | POA: Diagnosis not present

## 2024-06-05 DIAGNOSIS — I480 Paroxysmal atrial fibrillation: Secondary | ICD-10-CM | POA: Diagnosis present

## 2024-06-05 DIAGNOSIS — N3000 Acute cystitis without hematuria: Secondary | ICD-10-CM | POA: Diagnosis not present

## 2024-06-05 DIAGNOSIS — R000 Tachycardia, unspecified: Secondary | ICD-10-CM | POA: Diagnosis not present

## 2024-06-05 DIAGNOSIS — E0781 Sick-euthyroid syndrome: Secondary | ICD-10-CM | POA: Diagnosis present

## 2024-06-05 DIAGNOSIS — R2689 Other abnormalities of gait and mobility: Secondary | ICD-10-CM | POA: Diagnosis not present

## 2024-06-05 DIAGNOSIS — I4891 Unspecified atrial fibrillation: Secondary | ICD-10-CM | POA: Diagnosis not present

## 2024-06-05 DIAGNOSIS — R0902 Hypoxemia: Secondary | ICD-10-CM | POA: Diagnosis not present

## 2024-06-05 DIAGNOSIS — R109 Unspecified abdominal pain: Secondary | ICD-10-CM | POA: Diagnosis not present

## 2024-06-05 DIAGNOSIS — R918 Other nonspecific abnormal finding of lung field: Secondary | ICD-10-CM | POA: Diagnosis not present

## 2024-06-05 DIAGNOSIS — Z6821 Body mass index (BMI) 21.0-21.9, adult: Secondary | ICD-10-CM

## 2024-06-05 DIAGNOSIS — L8990 Pressure ulcer of unspecified site, unspecified stage: Secondary | ICD-10-CM | POA: Diagnosis present

## 2024-06-05 DIAGNOSIS — Z9181 History of falling: Secondary | ICD-10-CM | POA: Diagnosis not present

## 2024-06-05 DIAGNOSIS — M6281 Muscle weakness (generalized): Secondary | ICD-10-CM | POA: Diagnosis not present

## 2024-06-05 DIAGNOSIS — R4182 Altered mental status, unspecified: Secondary | ICD-10-CM | POA: Diagnosis not present

## 2024-06-05 DIAGNOSIS — M6259 Muscle wasting and atrophy, not elsewhere classified, multiple sites: Secondary | ICD-10-CM | POA: Diagnosis not present

## 2024-06-05 NOTE — ED Triage Notes (Signed)
 Pt BIB [facility] after husband reports that pt was too weak to get from sitting position on toilet to standing position. Pt is alert and oriented x 4 and ambulates with walker at baseline. Pt denies any complaints and refuses to have any testing done at this time. EMS administered LR 700mL bolus PTA.

## 2024-06-06 ENCOUNTER — Encounter (HOSPITAL_COMMUNITY): Payer: Self-pay | Admitting: Internal Medicine

## 2024-06-06 ENCOUNTER — Emergency Department (HOSPITAL_COMMUNITY)

## 2024-06-06 DIAGNOSIS — D72829 Elevated white blood cell count, unspecified: Secondary | ICD-10-CM | POA: Diagnosis not present

## 2024-06-06 DIAGNOSIS — I4891 Unspecified atrial fibrillation: Secondary | ICD-10-CM | POA: Diagnosis not present

## 2024-06-06 DIAGNOSIS — E860 Dehydration: Secondary | ICD-10-CM

## 2024-06-06 DIAGNOSIS — E43 Unspecified severe protein-calorie malnutrition: Secondary | ICD-10-CM | POA: Diagnosis present

## 2024-06-06 DIAGNOSIS — B961 Klebsiella pneumoniae [K. pneumoniae] as the cause of diseases classified elsewhere: Secondary | ICD-10-CM | POA: Diagnosis present

## 2024-06-06 DIAGNOSIS — M4856XA Collapsed vertebra, not elsewhere classified, lumbar region, initial encounter for fracture: Secondary | ICD-10-CM | POA: Diagnosis present

## 2024-06-06 DIAGNOSIS — K219 Gastro-esophageal reflux disease without esophagitis: Secondary | ICD-10-CM | POA: Diagnosis not present

## 2024-06-06 DIAGNOSIS — Z86711 Personal history of pulmonary embolism: Secondary | ICD-10-CM | POA: Diagnosis not present

## 2024-06-06 DIAGNOSIS — I959 Hypotension, unspecified: Secondary | ICD-10-CM

## 2024-06-06 DIAGNOSIS — Z79899 Other long term (current) drug therapy: Secondary | ICD-10-CM | POA: Diagnosis not present

## 2024-06-06 DIAGNOSIS — L89326 Pressure-induced deep tissue damage of left buttock: Secondary | ICD-10-CM | POA: Diagnosis present

## 2024-06-06 DIAGNOSIS — Z7901 Long term (current) use of anticoagulants: Secondary | ICD-10-CM

## 2024-06-06 DIAGNOSIS — J9811 Atelectasis: Secondary | ICD-10-CM | POA: Diagnosis present

## 2024-06-06 DIAGNOSIS — Z79890 Hormone replacement therapy (postmenopausal): Secondary | ICD-10-CM | POA: Diagnosis not present

## 2024-06-06 DIAGNOSIS — N3000 Acute cystitis without hematuria: Secondary | ICD-10-CM | POA: Diagnosis present

## 2024-06-06 DIAGNOSIS — E039 Hypothyroidism, unspecified: Secondary | ICD-10-CM | POA: Diagnosis present

## 2024-06-06 DIAGNOSIS — M4854XA Collapsed vertebra, not elsewhere classified, thoracic region, initial encounter for fracture: Secondary | ICD-10-CM | POA: Diagnosis present

## 2024-06-06 DIAGNOSIS — L89156 Pressure-induced deep tissue damage of sacral region: Secondary | ICD-10-CM | POA: Diagnosis present

## 2024-06-06 DIAGNOSIS — L89896 Pressure-induced deep tissue damage of other site: Secondary | ICD-10-CM | POA: Diagnosis present

## 2024-06-06 DIAGNOSIS — R531 Weakness: Secondary | ICD-10-CM

## 2024-06-06 DIAGNOSIS — L89891 Pressure ulcer of other site, stage 1: Secondary | ICD-10-CM | POA: Diagnosis present

## 2024-06-06 DIAGNOSIS — F4322 Adjustment disorder with anxiety: Secondary | ICD-10-CM | POA: Diagnosis present

## 2024-06-06 DIAGNOSIS — L89621 Pressure ulcer of left heel, stage 1: Secondary | ICD-10-CM | POA: Diagnosis present

## 2024-06-06 DIAGNOSIS — I700 Atherosclerosis of aorta: Secondary | ICD-10-CM | POA: Diagnosis present

## 2024-06-06 DIAGNOSIS — Y92009 Unspecified place in unspecified non-institutional (private) residence as the place of occurrence of the external cause: Secondary | ICD-10-CM | POA: Diagnosis not present

## 2024-06-06 DIAGNOSIS — N390 Urinary tract infection, site not specified: Secondary | ICD-10-CM | POA: Diagnosis not present

## 2024-06-06 DIAGNOSIS — W19XXXA Unspecified fall, initial encounter: Secondary | ICD-10-CM | POA: Diagnosis present

## 2024-06-06 DIAGNOSIS — Z86718 Personal history of other venous thrombosis and embolism: Secondary | ICD-10-CM | POA: Diagnosis not present

## 2024-06-06 DIAGNOSIS — I480 Paroxysmal atrial fibrillation: Secondary | ICD-10-CM | POA: Diagnosis present

## 2024-06-06 DIAGNOSIS — L89316 Pressure-induced deep tissue damage of right buttock: Secondary | ICD-10-CM | POA: Diagnosis present

## 2024-06-06 DIAGNOSIS — L8990 Pressure ulcer of unspecified site, unspecified stage: Secondary | ICD-10-CM | POA: Diagnosis present

## 2024-06-06 DIAGNOSIS — Z1612 Extended spectrum beta lactamase (ESBL) resistance: Secondary | ICD-10-CM | POA: Diagnosis present

## 2024-06-06 LAB — CBC WITH DIFFERENTIAL/PLATELET
Abs Immature Granulocytes: 0.18 10*3/uL — ABNORMAL HIGH (ref 0.00–0.07)
Basophils Absolute: 0.1 10*3/uL (ref 0.0–0.1)
Basophils Relative: 1 %
Eosinophils Absolute: 0.1 10*3/uL (ref 0.0–0.5)
Eosinophils Relative: 1 %
HCT: 44.6 % (ref 36.0–46.0)
Hemoglobin: 15 g/dL (ref 12.0–15.0)
Immature Granulocytes: 1 %
Lymphocytes Relative: 18 %
Lymphs Abs: 2.8 10*3/uL (ref 0.7–4.0)
MCH: 35 pg — ABNORMAL HIGH (ref 26.0–34.0)
MCHC: 33.6 g/dL (ref 30.0–36.0)
MCV: 104.2 fL — ABNORMAL HIGH (ref 80.0–100.0)
Monocytes Absolute: 1.3 10*3/uL — ABNORMAL HIGH (ref 0.1–1.0)
Monocytes Relative: 8 %
Neutro Abs: 10.9 10*3/uL — ABNORMAL HIGH (ref 1.7–7.7)
Neutrophils Relative %: 71 %
Platelets: 232 10*3/uL (ref 150–400)
RBC: 4.28 MIL/uL (ref 3.87–5.11)
RDW: 14.2 % (ref 11.5–15.5)
WBC: 15.3 10*3/uL — ABNORMAL HIGH (ref 4.0–10.5)
nRBC: 0 % (ref 0.0–0.2)

## 2024-06-06 LAB — COMPREHENSIVE METABOLIC PANEL WITH GFR
ALT: 10 U/L (ref 0–44)
AST: 35 U/L (ref 15–41)
Albumin: 2.7 g/dL — ABNORMAL LOW (ref 3.5–5.0)
Alkaline Phosphatase: 75 U/L (ref 38–126)
Anion gap: 9 (ref 5–15)
BUN: 9 mg/dL (ref 8–23)
CO2: 21 mmol/L — ABNORMAL LOW (ref 22–32)
Calcium: 8.3 mg/dL — ABNORMAL LOW (ref 8.9–10.3)
Chloride: 105 mmol/L (ref 98–111)
Creatinine, Ser: 0.89 mg/dL (ref 0.44–1.00)
GFR, Estimated: >60 mL/min (ref >60)
Glucose, Bld: 115 mg/dL — ABNORMAL HIGH (ref 70–99)
Potassium: 3.7 mmol/L (ref 3.5–5.1)
Sodium: 135 mmol/L (ref 135–145)
Total Bilirubin: 1.1 mg/dL (ref 0.0–1.2)
Total Protein: 5.7 g/dL — ABNORMAL LOW (ref 6.5–8.1)

## 2024-06-06 LAB — PROCALCITONIN: Procalcitonin: <0.1 ng/mL

## 2024-06-06 LAB — URINALYSIS, W/ REFLEX TO CULTURE (INFECTION SUSPECTED)
Bilirubin Urine: NEGATIVE
Glucose, UA: NEGATIVE mg/dL
Ketones, ur: NEGATIVE mg/dL
Nitrite: NEGATIVE
Protein, ur: 30 mg/dL — AB
Specific Gravity, Urine: 1.009 (ref 1.005–1.030)
WBC, UA: >50 WBC/hpf (ref 0–5)
pH: 6 (ref 5.0–8.0)

## 2024-06-06 LAB — MAGNESIUM: Magnesium: 1.4 mg/dL — ABNORMAL LOW (ref 1.7–2.4)

## 2024-06-06 LAB — TROPONIN I (HIGH SENSITIVITY): Troponin I (High Sensitivity): 14 ng/L (ref <18)

## 2024-06-06 MED ORDER — HYDROXYZINE HCL 25 MG PO TABS
25.0000 mg | ORAL_TABLET | Freq: Three times a day (TID) | ORAL | Status: DC | PRN
  Filled 2024-06-06: qty 1

## 2024-06-06 MED ORDER — LACTATED RINGERS IV SOLN: INTRAVENOUS | Status: AC

## 2024-06-06 MED ORDER — ACETAMINOPHEN 325 MG PO TABS
650.0000 mg | ORAL_TABLET | Freq: Four times a day (QID) | ORAL | Status: DC | PRN
  Filled 2024-06-06 (×4): qty 2

## 2024-06-06 MED ORDER — HYDROXYZINE HCL 25 MG PO TABS: 25.0000 mg | ORAL_TABLET | Freq: Three times a day (TID) | ORAL | Status: DC | PRN

## 2024-06-06 MED ORDER — MIDODRINE HCL 5 MG PO TABS
5.0000 mg | ORAL_TABLET | Freq: Two times a day (BID) | ORAL | Status: DC
  Administered 2024-06-06 – 2024-06-10 (×9): 5 mg via ORAL
  Filled 2024-06-06 (×11): qty 1

## 2024-06-06 MED ORDER — RIVAROXABAN 10 MG PO TABS
20.0000 mg | ORAL_TABLET | Freq: Every day | ORAL | Status: DC
  Administered 2024-06-06 – 2024-06-10 (×5): 20 mg via ORAL
  Filled 2024-06-06: qty 1
  Filled 2024-06-06 (×2): qty 2
  Filled 2024-06-06: qty 1
  Filled 2024-06-06: qty 2
  Filled 2024-06-06: qty 1

## 2024-06-06 MED ORDER — ACETAMINOPHEN 325 MG PO TABS
650.0000 mg | ORAL_TABLET | Freq: Three times a day (TID) | ORAL | Status: DC | PRN
  Filled 2024-06-06: qty 2

## 2024-06-06 MED ORDER — METOPROLOL TARTRATE 12.5 MG HALF TABLET
12.5000 mg | ORAL_TABLET | Freq: Two times a day (BID) | ORAL | Status: DC
  Administered 2024-06-06 – 2024-06-08 (×3): 12.5 mg via ORAL
  Filled 2024-06-06 (×10): qty 1

## 2024-06-06 MED ORDER — SODIUM CHLORIDE 0.9 % IV SOLN
1.0000 g | Freq: Every day | INTRAVENOUS | Status: DC
  Administered 2024-06-06: 1 g via INTRAVENOUS
  Filled 2024-06-06: qty 10
  Filled 2024-06-06: qty 1

## 2024-06-06 MED ORDER — THIAMINE HCL 100 MG/ML IJ SOLN
100.0000 mg | Freq: Once | INTRAMUSCULAR | Status: AC
  Administered 2024-06-06: 100 mg via INTRAVENOUS
  Filled 2024-06-06: qty 2

## 2024-06-06 MED ORDER — POTASSIUM CHLORIDE CRYS ER 20 MEQ PO TBCR
40.0000 meq | EXTENDED_RELEASE_TABLET | Freq: Once | ORAL | Status: AC
  Administered 2024-06-06: 40 meq via ORAL
  Filled 2024-06-06: qty 2

## 2024-06-06 MED ORDER — ONDANSETRON HCL 4 MG/2ML IJ SOLN: 4.0000 mg | Freq: Four times a day (QID) | INTRAMUSCULAR | Status: DC | PRN

## 2024-06-06 MED ORDER — ACETAMINOPHEN 650 MG RE SUPP
650.0000 mg | Freq: Four times a day (QID) | RECTAL | Status: DC | PRN
  Filled 2024-06-06 (×4): qty 1

## 2024-06-06 MED ORDER — IOHEXOL 350 MG/ML SOLN
75.0000 mL | Freq: Once | INTRAVENOUS | Status: AC | PRN
  Administered 2024-06-06: 75 mL via INTRAVENOUS

## 2024-06-06 MED ORDER — SODIUM CHLORIDE 0.9 % IV SOLN
1.0000 g | Freq: Every day | INTRAVENOUS | Status: DC
  Administered 2024-06-07 – 2024-06-08 (×2): 1 g via INTRAVENOUS
  Filled 2024-06-06 (×3): qty 10

## 2024-06-06 MED ORDER — MAGNESIUM SULFATE 2 GM/50ML IV SOLN
2.0000 g | INTRAVENOUS | Status: AC
  Administered 2024-06-06: 2 g via INTRAVENOUS
  Filled 2024-06-06: qty 50

## 2024-06-06 MED ORDER — SODIUM CHLORIDE 0.9 % IV BOLUS
500.0000 mL | Freq: Once | INTRAVENOUS | Status: AC
  Administered 2024-06-06: 500 mL via INTRAVENOUS

## 2024-06-06 MED ORDER — MELATONIN 3 MG PO TABS
3.0000 mg | ORAL_TABLET | Freq: Every evening | ORAL | Status: DC | PRN
  Filled 2024-06-06: qty 1

## 2024-06-06 MED ORDER — ACETAMINOPHEN 650 MG RE SUPP
650.0000 mg | Freq: Four times a day (QID) | RECTAL | Status: DC | PRN
  Filled 2024-06-06: qty 1

## 2024-06-06 MED ORDER — ONDANSETRON HCL 4 MG PO TABS
4.0000 mg | ORAL_TABLET | Freq: Four times a day (QID) | ORAL | Status: DC | PRN
  Administered 2024-06-10: 4 mg via ORAL
  Filled 2024-06-06 (×7): qty 1

## 2024-06-06 NOTE — Hospital Course (Addendum)
 [name] with medical history significant of atrial fibrillation, hypotension on midodrine , PE DVT on anticoagulation, GERD presenting with weakness, fall, UTI.  Patient reports progressive weakness at home over the past 1 to 2 days.  Was using Tylenol  patient fell at toilet.  No reported head trauma or LOC.  Patient reports mild generalized weakness.  Also with decreased p.o. intake.  No fevers or chills.  Mild nausea.  No belly pain or diarrhea.  Decreased p.o. intake over multiple days.  Noted to have been admitted April 20 through April 24 for issues including choledocholithiasis with concern for early cholecystitis.  Had ERCP with biliary sphincterotomy and sphincteroplasty.  Patient declined cholecystectomy.  No reported right upper quadrant pain. Presented to the ER afebrile, hemodynamically stable.  Satting well on room air.  White count 15.3, hemoglobin 15, platelets 232, creatinine 0.9, AST, ALT as well as T. bili within normal limits.  Magnesium  1.4.  CT head within normal limits.  CT angio chest negative for PE but though showing?  Right lower lobe atelectasis versus early pneumonia.  CT abdomen pelvis with increased urinary bladder wall thickening  Significant Events: Admitted 06/05/2024 for UTI to [facility] program 06-08-2024 had to be transferred back to hospital due to worsening mentation and urine cx growing ESBL Klebsiella requiring more intensive IV ABX that can be provided at [facility] program.  Admission Labs: WBC 15.3, HgB 15, Plt 232 Na 135, K 3.7, CO2 of 21, BUN 9, Scr 0.89, glu 115 UA cloudy, spg 1.009, nitrite negative, LE Large, WBC >50  Admission Imaging Studies: CT head No acute intracranial abnormality. 2. Stable non contrast CT appearance of chronic white matter disease, and chronic left mastoid air cell opacification CTPA Negative for acute pulmonary embolus. Mild respiratory motion at the right lung base. 2. Generalized increased  pulmonary subpleural scarring since a 2014 CTA. Mild left lung atelectasis. More confluent right lower lobe opacity is indeterminate for atelectasis versus developing infection. No consolidation or pleural effusion. 3. CT Abdomen and Pelvis today reported separately. 4. Calcified coronary artery, Aortic Atherosclerosis CT abd/pelvis Mild but increased urinary bladder wall thickening since last year. UTI/Cystitis not excluded. 2. CTA Chest reported separately today but lung bases on these images and compared to November CT Abdomen and Pelvis appear more stable, arguing in favor of atelectasis and scarring rather than acute lung base infection. 3. No other acute or inflammatory process identified in the abdomen or pelvis. Sequelae of ERCP in April. 4. Chronic lower thoracic and lumbar compression fractures. Aortic Atherosclerosis   Significant Labs: Urine culture growing >=100,000 COLONIES/mL KLEBSIELLA PNEUMONIAE  Urine cx shows ESBL Klebsiella  Significant Imaging Studies:   Antibiotic Therapy: Anti-infectives (From admission, onward)    Start     Dose/Rate Route Frequency Ordered Stop   06/10/24 0800  ciprofloxacin  (CIPRO ) tablet 500 mg        500 mg Oral 2 times daily 06/09/24 1318 06/13/24 0759   06/08/24 1530  meropenem (MERREM) 1 g in sodium chloride  0.9 % 100 mL IVPB  Status:  Discontinued        1 g 200 mL/hr over 30 Minutes Intravenous STAT 06/08/24 1439 06/08/24 1443   06/08/24 1530  meropenem (MERREM) 1 g in sodium chloride  0.9 % 100 mL IVPB        1 g 200 mL/hr over 30 Minutes Intravenous Every 12 hours 06/08/24 1443 06/09/24 2329   06/07/24 1000  cefTRIAXone  (ROCEPHIN ) 1 g  in sodium chloride  0.9 % 100 mL IVPB  Status:  Discontinued        1 g 200 mL/hr over 30 Minutes Intravenous Daily 06/06/24 1637 06/08/24 1243   06/06/24 0600  cefTRIAXone  (ROCEPHIN ) 1 g in sodium chloride  0.9 % 100 mL IVPB  Status:  Discontinued        1 g 200 mL/hr over 30 Minutes Intravenous Daily 06/06/24  0533 06/06/24 1637       Procedures:   Consultants:

## 2024-06-06 NOTE — Consult Note (Addendum)
 WOC Nurse Consult Note: Reason for Consult: sacral wound  Wound type: 1.  Deep Tissue Pressure Injury sacrum/buttocks with purple maroon discoloration  2.  R scapula deep tissue pressure injury purple maroon discoloration  3.  L scapula Stage 1Pressure Injury  4.  B heels Stage 1 pressure injuries  Pressure Injury POA: Yes Measurement: see nursing flowsheet  Wound bed: as above  Drainage (amount, consistency, odor) see nursing flowsheet  Periwound: appears to have chronic tissue damage to buttocks, per notes spends most of time sitting in recliner Dressing procedure/placement/frequency:  Cleanse buttocks/sacrum with soap and water, dry and apply Xeroform gauze ([place] [phone]) to areas of purple maroon discoloration daily and secure with silicone foam or ABD pad whichever is preferred.  Apply silicone foam to B scapula and B heels, lift to assess daily.   Patient should be placed on a low air loss mattress for pressure redistribution and moisture management.   WOC team will not follow. Re-consult if further needs arise.   Thank you    Ronni Colace MSN, RN-BC, CWOCN

## 2024-06-06 NOTE — ED Provider Notes (Signed)
 [place] EMERGENCY DEPARTMENT AT [facility] Provider Note   CSN: [id number] Arrival date & time: 06/05/24  2316     Patient presents with: Weakness   Stacy Valencia is a 89 y.o. female.   The history is provided by the patient.  Weakness Stacy Valencia is a 89 y.o. female who presents to the Emergency Department complaining of weakness.  She presents to the emergency department by EMS from home for evaluation of generalized weakness.  She was unable to get off the commode when she was urinating.  She needed her husband to assist her up and she started to fall to the side but he was able to catch her.  She reports poor oral intake for the last few days due to feeling unwell.  She also reports that she has had chills today.  No dysuria, vomiting.  She reports chronic mild cough.  No shortness of breath, chest pain.  She lives at home with her husband.  She ambulates with a walker at baseline.      Prior to Admission medications   Medication Sig Start Date End Date Taking? Authorizing Provider  acetaminophen  (TYLENOL ) 500 MG tablet Take 500 mg by mouth every 6 (six) hours as needed for mild pain (pain score 1-3).    [provider]  Cholecalciferol 50 MCG (2000 UT) TABS Take by mouth.    [provider]  metoprolol  tartrate (LOPRESSOR ) 25 MG tablet Take 0.5 tablets (12.5 mg total) by mouth 2 (two) times daily. 08/09/20   Angiulli, Everlyn Hockey, PA-C  midodrine  (PROAMATINE ) 5 MG tablet Take 1 tablet (5 mg total) by mouth in the morning and at bedtime. Take 5 mg at 8AM and 5mg  at [facility] 03/21/24   Turner, Rufus Council, MD  omeprazole  (PRILOSEC) 20 MG capsule Take 20 mg by mouth daily. 05/23/1998   [provider]  XARELTO  20 MG TABS tablet TAKE 1 TABLET BY MOUTH EVERY DAY 04/15/22   Shadad, Firas N, MD    Allergies: Sulfa antibiotics    Review of Systems  Neurological:  Positive for weakness.  All other systems reviewed and are negative.   Updated Vital  Signs BP (!) 101/57   Pulse 81   Temp 98 F (36.7 C)   Resp 18   SpO2 100%   Physical Exam Vitals and nursing note reviewed.  Constitutional:      Appearance: She is well-developed.  HENT:     Head: Normocephalic and atraumatic.   Cardiovascular:     Rate and Rhythm: Normal rate and regular rhythm.     Heart sounds: No murmur heard. Pulmonary:     Effort: Pulmonary effort is normal. No respiratory distress.     Breath sounds: Normal breath sounds.  Abdominal:     Palpations: Abdomen is soft.     Tenderness: There is no abdominal tenderness. There is no guarding or rebound.   Musculoskeletal:        General: No tenderness.   Skin:    General: Skin is warm and dry.   Neurological:     Mental Status: She is alert and oriented to person, place, and time.     Comments: Mild global weakness.  Able to lift bilateral legs off the stretcher.  Sensation to light touch intact in all 4 extremities  Psychiatric:        Behavior: Behavior normal.     (all labs ordered are listed, but only abnormal results are displayed) Labs Reviewed  COMPREHENSIVE METABOLIC PANEL WITH GFR - Abnormal; Notable for the following components:      Result Value   CO2 21 (*)    Glucose, Bld 115 (*)    Calcium  8.3 (*)    Total Protein 5.7 (*)    Albumin 2.7 (*)    All other components within normal limits  CBC WITH DIFFERENTIAL/PLATELET - Abnormal; Notable for the following components:   WBC 15.3 (*)    MCV 104.2 (*)    MCH 35.0 (*)    Neutro Abs 10.9 (*)    Monocytes Absolute 1.3 (*)    Abs Immature Granulocytes 0.18 (*)    All other components within normal limits  URINALYSIS, W/ REFLEX TO CULTURE (INFECTION SUSPECTED) - Abnormal; Notable for the following components:   APPearance CLOUDY (*)    Hgb urine dipstick SMALL (*)    Protein, ur 30 (*)    Leukocytes,Ua LARGE (*)    Bacteria, UA RARE (*)    All other components within normal limits  URINE CULTURE  VITAMIN B1  MAGNESIUM    TROPONIN I (HIGH SENSITIVITY)    EKG: EKG Interpretation Date/Time:  Monday June 06 2024 01:10:10 EDT Ventricular Rate:  99 PR Interval:  220 QRS Duration:  72 QT Interval:  330 QTC Calculation: 423 R Axis:   -12  Text Interpretation: Sinus rhythm with 1st degree A-V block Minimal voltage criteria for LVH, may be normal variant ( R in aVL ) Anterior infarct , age undetermined Abnormal ECG Confirmed by Kelsey Patricia [phone]) on 06/06/2024 1:32:01 AM  Radiology: CT ABDOMEN PELVIS W CONTRAST Result Date: 06/06/2024 CLINICAL DATA:  89 year old female with altered mental status. Generalized weakness. Acute pain. History of ERCP and choledocholithiasis stone removal in April. EXAM: CT ABDOMEN AND PELVIS WITH CONTRAST TECHNIQUE: Multidetector CT imaging of the abdomen and pelvis was performed using the standard protocol following bolus administration of intravenous contrast. RADIATION DOSE REDUCTION: This exam was performed according to the departmental dose-optimization program which includes automated exposure control, adjustment of the mA and/or kV according to patient size and/or use of iterative reconstruction technique. CONTRAST:  75mL OMNIPAQUE  IOHEXOL  350 MG/ML SOLN COMPARISON:  CTA chest today. CT Abdomen and Pelvis 11/19/2023. Images from ERCP 04/13/2024. FINDINGS: Lower chest: CTA chest today is reported separately. On these images and compared to the November CT Abdomen and Pelvis, lung base ventilation is more stable. Patchy bilateral lower lobe opacity more resembles atelectasis. Punctate right lower lobe calcified granuloma again noted. Hepatobiliary: Small volume pneumobilia, within normal limits status post ERCP in April. Contracted gallbladder also with a small volume of gas in the lumen (series 8 image 19). No convincing pericholecystic inflammation. No biliary ductal dilatation. Liver enhancement within normal limits. Pancreas: Atrophied. Spleen: Negative. Adrenals/Urinary Tract:  Normal adrenal glands. Kidneys are stable and nonobstructed, with symmetric and normal contrast excretion on the delayed excretory images. Diminutive ureters appear normal to the bladder. Occasional pelvic phleboliths. Mild nonspecific bladder wall thickening is increased compared to last year. And mild urinary wall inflammatory stranding is possible on series 8, image 72. Stomach/Bowel: Nondilated large and small bowel loops. Diverticulosis of the descending and proximal sigmoid colon. No associated active inflammation. Mild large bowel retained stool throughout. Diminutive or absent appendix, no large bowel inflammation identified. Terminal ileum within normal limits. Small gastric hiatal hernia is stable. Negative stomach and duodenum otherwise. No pneumoperitoneum, free fluid, or mesenteric inflammation identified. Vascular/Lymphatic: Aortoiliac calcified atherosclerosis. Normal caliber abdominal aorta, mild tortuosity. Major arterial structures in the  abdomen and pelvis remain patent despite widespread atherosclerosis. Portal venous system appears to be patent. No lymphadenopathy identified. Reproductive: Diminutive or absent. Other: No pelvis free fluid. Musculoskeletal: Stable chronic T12, L2, L3 compression fractures since last year. Lumbar vertebrae again demonstrate vertebra plana and retropulsion. Mild chronic L4 inferior endplate deformity also is stable. Chronic left femur ORIF partially visible, appears stable. No acute osseous abnormality identified. IMPRESSION: 1. Mild but increased urinary bladder wall thickening since last year. UTI/Cystitis not excluded. 2. CTA Chest reported separately today but lung bases on these images and compared to November CT Abdomen and Pelvis appear more stable, arguing in favor of atelectasis and scarring rather than acute lung base infection. 3. No other acute or inflammatory process identified in the abdomen or pelvis. Sequelae of ERCP in April. 4. Chronic lower  thoracic and lumbar compression fractures. Aortic Atherosclerosis (ICD10-I70.0). Electronically Signed   By: Marlise Simpers M.D.   On: 06/06/2024 04:27   CT Angio Chest PE W/Cm &/Or Wo Cm Result Date: 06/06/2024 CLINICAL DATA:  89 year old female with altered mental status. Generalized weakness. Acute pain. History of ERCP and choledocholithiasis stone removal in April. EXAM: CT ANGIOGRAPHY CHEST WITH CONTRAST TECHNIQUE: Multidetector CT imaging of the chest was performed using the standard protocol during bolus administration of intravenous contrast. Multiplanar CT image reconstructions and MIPs were obtained to evaluate the vascular anatomy. RADIATION DOSE REDUCTION: This exam was performed according to the departmental dose-optimization program which includes automated exposure control, adjustment of the mA and/or kV according to patient size and/or use of iterative reconstruction technique. CONTRAST:  75mL OMNIPAQUE  IOHEXOL  350 MG/ML SOLN COMPARISON:  CTA chest 01/05/2013. CT Abdomen and Pelvis today reported separately. FINDINGS: Cardiovascular: Excellent contrast bolus timing in the pulmonary arterial tree. No central or left lung pulmonary artery filling defect. Mild respiratory motion, most pronounced at the right lung base where distal pulmonary artery branch detail is obscured. No right lung pulmonary artery filling defect identified. Calcified coronary artery and aortic atherosclerosis. Heart size remains within normal limits. Tortuous thoracic aorta with little contrast in the aorta on this exam. No pericardial effusion. Mediastinum/Nodes: Small mediastinal lymph nodes are stable since 2014, negative. Lungs/Pleura: Major airways are patent. Slightly lower lung volumes compared to 2014. Since that time progressed symmetric bilateral pulmonary subpleural reticular opacity, scarring. Minor dependent atelectasis superimposed in the left lung. Increased elevation of the right hemidiaphragm since 2014 and right  lower lobe more confluent, nonspecific peribronchial and confluent lung opacity. Punctate calcified granuloma in the right lower lobe. No pleural effusion. No consolidation at this time. Upper Abdomen: CT Abdomen and Pelvis today reported separately. Musculoskeletal: More exaggerated thoracic kyphosis compared to 2014. Generalized osteopenia. Mild T2 inferior endplate compression fracture is chronic and stable. No acute or suspicious osseous lesion identified. Review of the MIP images confirms the above findings. IMPRESSION: 1. Negative for acute pulmonary embolus. Mild respiratory motion at the right lung base. 2. Generalized increased pulmonary subpleural scarring since a 2014 CTA. Mild left lung atelectasis. More confluent right lower lobe opacity is indeterminate for atelectasis versus developing infection. No consolidation or pleural effusion. 3. CT Abdomen and Pelvis today reported separately. 4. Calcified coronary artery, Aortic Atherosclerosis (ICD10-I70.0). Electronically Signed   By: Marlise Simpers M.D.   On: 06/06/2024 04:19   CT Head Wo Contrast Result Date: 06/06/2024 CLINICAL DATA:  89 year old female with altered mental status. Generalized weakness. EXAM: CT HEAD WITHOUT CONTRAST TECHNIQUE: Contiguous axial images were obtained from the base of the skull through  the vertex without intravenous contrast. RADIATION DOSE REDUCTION: This exam was performed according to the departmental dose-optimization program which includes automated exposure control, adjustment of the mA and/or kV according to patient size and/or use of iterative reconstruction technique. COMPARISON:  Head CT 11/19/2023. FINDINGS: Brain: Stable cerebral volume, normal for age. No midline shift, ventriculomegaly, mass effect, evidence of mass lesion, intracranial hemorrhage or evidence of cortically based acute infarction. Patchy and confluent bilateral cerebral white matter hypodensity is stable. No cortical encephalomalacia identified.  Vascular: Calcified atherosclerosis at the skull base. No suspicious intracranial vascular hyperdensity. Skull: Intact and stable.  No acute osseous abnormality identified. Sinuses/Orbits: Chronic left mastoid air cell opacification is stable since last year. Other Visualized paranasal sinuses and mastoids are stable and well aerated. Other: No acute orbit or scalp soft tissue finding identified. IMPRESSION: 1. No acute intracranial abnormality. 2. Stable non contrast CT appearance of chronic white matter disease, and chronic left mastoid air cell opacification. Electronically Signed   By: Marlise Simpers M.D.   On: 06/06/2024 04:13     Procedures   Medications Ordered in the ED  acetaminophen  (TYLENOL ) tablet 650 mg (has no administration in time range)    Or  acetaminophen  (TYLENOL ) suppository 650 mg (has no administration in time range)  melatonin tablet 3 mg (has no administration in time range)  ondansetron  (ZOFRAN ) injection 4 mg (has no administration in time range)  cefTRIAXone  (ROCEPHIN ) 1 g in sodium chloride  0.9 % 100 mL IVPB (0 g Intravenous Stopped 06/06/24 0655)  thiamine (VITAMIN B1) injection 100 mg (100 mg Intravenous Given 06/06/24 0304)  iohexol  (OMNIPAQUE ) 350 MG/ML injection 75 mL (75 mLs Intravenous Contrast Given 06/06/24 0406)                                    Medical Decision Making Amount and/or Complexity of Data Reviewed Labs: ordered. Radiology: ordered.  Risk Prescription drug management. Decision regarding hospitalization.   Patient with history of A-fib, DVT on anticoagulation here for evaluation of progressive weakness from home, poor oral intake for the last several days.  She has global weakness on examination.  CBC with leukocytosis.  Husband reports that patient has been altered and confused at home.  She does not appear to be very confused in the emergency department.  CT head is negative for acute abnormality.  CT chest is negative for PE.  CT abdomen  pelvis is concerning for cystitis.  UA is also concerning for UTI.  Will send a culture and start on antibiotics.  Given poor oral intake, global weakness, inability to function at home and UTI medicine consulted for admission.  She was treated with thiamine for possible encephalopathy given reports of confusion at home.  Patient's husband updated of findings of studies over the phone and he is in agreement with admission.  Patient also updated at the bedside.  Hospitalist consulted for admission for ongoing care.     Final diagnoses:  Acute UTI  Generalized weakness    ED Discharge Orders     None          Kelsey Patricia, MD 06/06/24 [phone]

## 2024-06-06 NOTE — Progress Notes (Signed)
 Report given to [facility]. Patient will be transferred under hospital at home . Patient is stable upon disharge.

## 2024-06-06 NOTE — Assessment & Plan Note (Addendum)
 Cont protonix  bid

## 2024-06-06 NOTE — TOC Initial Note (Signed)
 Transition of Care [facility]) - Initial/Assessment Note    Patient Details  Name: Stacy Valencia MRN: [id number] Date of Birth: 02/12/1933  Transition of [facility] CM/SW Contact:    Joanette Moynahan, RN Phone Number: 06/06/2024, 2:42 PM  Clinical Narrative:                 Pt admitted under INPATIENT status in Hospital at Home service.  RNCM following for discharge needs.    Expected Discharge Plan: Home w Home Health Services Barriers to Discharge: Continued Medical Work up   Patient Goals and CMS Choice Patient states their goals for this hospitalization and ongoing recovery are:: to return home          Expected Discharge Plan and Services   Discharge Planning Services: CM Consult   Living arrangements for the past 2 months: Single Family Home                           HH Arranged: PT, OT HH Agency: [facility] Date [facility] Agency Contacted: 06/06/24 Time HH Agency Contacted: 1435 Representative spoke with at [facility] Agency: Randel Buss  Prior Living Arrangements/Services Living arrangements for the past 2 months: Single Family Home Lives with:: Spouse Patient language and need for interpreter reviewed:: Yes Do you feel safe going back to the place where you live?: Yes      Need for Family Participation in Patient Care: Yes (Comment) Care giver support system in place?: Yes (comment) Current home services: DME (walker, wheelchair) Criminal Activity/Legal Involvement Pertinent to Current Situation/Hospitalization: No - Comment as needed  Activities of Daily Living      Permission Sought/Granted Permission sought to share information with : Case Manager, Family Supports Permission granted to share information with : Yes, Verbal Permission Granted  Share Information with NAME: Porfirio Bristol           Emotional Assessment       Orientation: : Oriented to Self, Oriented to Place, Oriented to  Time, Oriented to Situation Alcohol / Substance Use: Never Used Psych  Involvement: No (comment)  Admission diagnosis:  Acute cystitis [N30.00] Acute UTI [N39.0] Generalized weakness [R53.1] UTI (urinary tract infection) [N39.0] Patient Active Problem List   Diagnosis Date Noted   Acute cystitis 06/06/2024   Weakness 06/06/2024   Leukocytosis 06/06/2024   Pressure injury of skin 06/06/2024   Fall 11/19/2023   Atrial fibrillation (HCC) 11/19/2023   Transient hypotension 11/19/2023   Abnormal TSH 11/19/2023   UTI (urinary tract infection) 08/13/2020   Adjustment disorder with anxious mood    Supplemental oxygen  dependent    Sinus tachycardia    History of adverse effect of venous thromboembolism (VTE) prophylaxis 07/22/2020   GERD (gastroesophageal reflux disease) 01/05/2013   PCP:  Renetta Carter, MD (Inactive) Pharmacy:   [facility] [phone] Jonette Nestle, [place] - [address] [place] [address] Phone: [phone] Fax: [phone]     Social Drivers of Health (SDOH) Social History: SDOH Screenings   Food Insecurity: No Food Insecurity (06/06/2024)  Housing: Low Risk  (06/06/2024)  Transportation Needs: No Transportation Needs (06/06/2024)  Utilities: Not At Risk (04/10/2024)  Social Connections: Moderately Integrated (06/06/2024)  Tobacco Use: Low Risk  (06/06/2024)   SDOH Interventions:     Readmission Risk Interventions    04/14/2024    2:39 PM  Readmission Risk Prevention Plan  Post Dischage Appt Complete  Medication Screening Complete  Transportation Screening Complete

## 2024-06-06 NOTE — Progress Notes (Signed)
  Carryover admission to the Day Admitter.  I discussed this case with the EDP, Dr. Monique Ano.  Per these discussions:   This is a 89 year old female with history of paroxysmal atrial fibrillation chronically anticoagulated on Xarelto , who is being admitted with generalized weakness in the setting of acute cystitis after presenting from home with her husband, complaining of generalized weakness for the last few days, along with decline in oral intake due to diminished appetite over the last 2 to 3 days.  In the context of his generalized weakness, the patient was reported to be too weak to stand up when she was sitting on the toilet, although this did not result in any fall or syncopal episode.  Labs in the ED today were notable for CBC which showed white blood cell count of 15,000.  Urinalysis was suggestive of infection with greater than 50 red blood cells and large leukocyte esterase.  I have placed an order for observation to med/tele for further evaluation management of the above.  I have placed some additional preliminary admit orders via the adult multi-morbid admission order set. I have also ordered Rocephin  for her suspected acute cystitis.  Also ordered fall precautions, PT/OT consults to occur in the morning, and have added on a magnesium  level.    Camelia Cavalier, DO Hospitalist

## 2024-06-06 NOTE — Progress Notes (Signed)
 [facility] new admission safety assessment performed. Pt's home appears clean and walkways throughout home are clear. Pt spends majority of day in recliner with lift capabilities, in living room. Pt sleeps in bedroom which is through kitchen, connected to living room, and down hallway. Pt has a grab bar attached to side of bed to assist with getting in and out of bed. Pt has a bathroom accessible from bedroom and living room, but it is closer to bedroom. Pt has a toilet seat lift in that bathroom. Pt also has a master bathroom attached to bedroom but there is no seat lift and Pt rarely uses that one. On the other side of the house, closer to the living room, there is another bathroom with a walk in shower. This shower has two grab bars and a shower seat. Pt's husband/ caregiver, [place], states this is where he helps her bathe. While pt is on [facility] program, she will avoid showers due to weakness. Pt's husband requests no rinse cleanser and shampoo to help with bathing while on program and I told him I will bring some for the morning visit tomorrow. Pt ambulates with minimal assistance using both a Rolator walker and traditional walker. Pt has a bsc available but refuses to use same, per husband. No major safety concerns noted at this time.

## 2024-06-06 NOTE — Assessment & Plan Note (Addendum)
 06-06-2024 Minimal to mild agitation on presentation in setting of UTI and dehydration  CT head WNL  Monitor for now   06-07-2024 stable  06-08-2024 add remeron  at bedtime for her agitation and also to help her appetite. Remeron  15 mg at bedtime.  06-09-2024 continue with remeron  at bedtime.  06-10-2024 continue remeron 

## 2024-06-06 NOTE — ED Notes (Signed)
 Called back to the floor, advised that room 30 would be coming up soon.

## 2024-06-06 NOTE — Evaluation (Signed)
 Physical Therapy Evaluation Patient Details Name: Stacy Valencia MRN: [id number] DOB: 1933-04-05 Today's Date: 06/06/2024  History of Present Illness  89 y.o. female presents to the emergency department 6/15 by EMS from home for evaluation of generalized weakness. CT abdomen pelvis is concerning for cystitis. UA is also concerning for UTI. PMH: A-fib, DVT, PE, acute cholecystitis.  Clinical Impression  Pt admitted with above complications. Typically ambulatory with RW on her own within their house. Have a w/c and transport chair available for out of home appointments as needed. Pt assists a little with transfers and ADLs. She has been receiving HHPT and husband notices an improvement in function since a prior admission in April. Eager to continue HHPT after d/c. During [facility] evaluation she was able to transfer with min assist from edge of bed, and CGA from bed side commode. Episode of urinary incontinence upon standing, assisted with hygienic care, states she typically wears a brief at home. Stacy Valencia was able to ambulate up to 50 feet at a supervision level today while using a RW for support (close to baseline apparently.) Spoke with husband over phone and provided update on current functional status. States he feels confident with ability to resume care at home and open to HHPT after formal d/c. HE cannot think of any additional equipment or resources that would be helpful. She is fairly well stocked with multiple pieces of DME. Pt currently with functional limitations due to the deficits listed below (see PT Problem List). Pt will benefit from acute skilled PT to increase their independence and safety with mobility to allow discharge.           If plan is discharge home, recommend the following: A little help with walking and/or transfers;A little help with bathing/dressing/bathroom;Assistance with cooking/housework;Direct supervision/assist for medications management;Direct supervision/assist for  financial management;Assist for transportation;Help with stairs or ramp for entrance;Supervision due to cognitive status   Can travel by private vehicle        Equipment Recommendations None recommended by PT  Recommendations for Other Services       Functional Status Assessment Patient has had a recent decline in their functional status and demonstrates the ability to make significant improvements in function in a reasonable and predictable amount of time.     Precautions / Restrictions Precautions Precautions: Fall Recall of Precautions/Restrictions: Impaired Restrictions Weight Bearing Restrictions Per Provider Order: No      Mobility  Bed Mobility Overal bed mobility: Needs Assistance Bed Mobility: Rolling, Sidelying to Sit, Sit to Supine Rolling: Supervision Sidelying to sit: Min assist, Used rails   Sit to supine: Supervision   General bed mobility comments: Supervision to roll and return back to bed for safety. Min assist to pull through therapist's hand to rise to EOB. Cues for technique. Effortful.    Transfers Overall transfer level: Needs assistance Equipment used: Rolling walker (2 wheels) Transfers: Sit to/from Stand, Bed to chair/wheelchair/BSC Sit to Stand: Min assist   Step pivot transfers: Contact guard assist       General transfer comment: Min assist for boost to stand from edge of bed, cues for hand placement, RW to steady upon rising. CGA to stand from [facility] with arm rests. CGA to step pivot transfer to [facility]. No LOB.    Ambulation/Gait Ambulation/Gait assistance: Supervision Gait Distance (Feet): 50 Feet Assistive device: Rolling walker (2 wheels) Gait Pattern/deviations: Step-through pattern, Decreased stride length, Trunk flexed Gait velocity: dec Gait velocity interpretation: <1.8 ft/sec, indicate of risk for recurrent falls  General Gait Details: Supervision for safety. Ambulates with RW for support, declined to proceed further. no dyspnea  noted. Cues for upright posture and safety awareness. Good RW control. No buckling or overt LOB while using this device.  Stairs            Wheelchair Mobility     Tilt Bed    Modified Rankin (Stroke Patients Only)       Balance Overall balance assessment: Needs assistance Sitting-balance support: No upper extremity supported, Feet supported Sitting balance-Leahy Scale: Good     Standing balance support: Single extremity supported, During functional activity, Reliant on assistive device for balance Standing balance-Leahy Scale: Poor                               Pertinent Vitals/Pain Pain Assessment Pain Assessment: No/denies pain    Home Living Family/patient expects to be discharged to:: Private residence Living Arrangements: Spouse/significant other Available Help at Discharge: Family;Available 24 hours/day Type of Home: House Home Access: Ramped entrance       Home Layout: One level Home Equipment: Agricultural consultant (2 wheels);Wheelchair - manual;Shower seat;BSC/3in1;Grab bars - tub/shower;Hand held Sales executive;Toilet riser Additional Comments: Pt lives at home with husband, has lift chair for standing.    Prior Function Prior Level of Function : Needs assist;History of Falls (last six months)             Mobility Comments: Uses rolling walker and lift chair. Husband helps with standing ADLs Comments: husband helps with all ADLs, bathing, transfers     Extremity/Trunk Assessment   Upper Extremity Assessment Upper Extremity Assessment: Defer to OT evaluation    Lower Extremity Assessment Lower Extremity Assessment: Generalized weakness       Communication   Communication Communication: Impaired Factors Affecting Communication: Hearing impaired    Cognition Arousal: Alert Behavior During Therapy: Agitated, Lability   PT - Cognitive impairments: No family/caregiver present to determine baseline, Orientation,  Safety/Judgement   Orientation impairments: Time, Situation                   PT - Cognition Comments: Aware of self, year, location. States month is January (currently June) and thought that she had been admitted for several days. Unsure of situation. Following commands: Impaired Following commands impaired: Follows multi-step commands with increased time, Follows multi-step commands inconsistently, Only follows one step commands consistently     Cueing Cueing Techniques: Verbal cues, Gestural cues     General Comments General comments (skin integrity, edema, etc.): Urinary incontinence upon standing. Assisted with hygiene care. States she wears briefs at home    Exercises     Assessment/Plan    PT Assessment Patient needs continued PT services  PT Problem List Decreased strength;Decreased activity tolerance;Decreased balance;Decreased mobility;Decreased cognition;Decreased safety awareness;Decreased knowledge of precautions       PT Treatment Interventions DME instruction;Gait training;Functional mobility training;Therapeutic activities;Therapeutic exercise;Balance training;Neuromuscular re-education;Patient/family education;Cognitive remediation    PT Goals (Current goals can be found in the Care Plan section)  Acute Rehab PT Goals Patient Stated Goal: Go home PT Goal Formulation: With patient/family Time For Goal Achievement: 06/20/24 Potential to Achieve Goals: Good    Frequency Min 2X/week     Co-evaluation               AM-PAC PT 6 Clicks Mobility  Outcome Measure Help needed turning from your back to your side while in a flat bed without  using bedrails?: A Little Help needed moving from lying on your back to sitting on the side of a flat bed without using bedrails?: A Little Help needed moving to and from a bed to a chair (including a wheelchair)?: A Little Help needed standing up from a chair using your arms (e.g., wheelchair or bedside chair)?: A  Little Help needed to walk in hospital room?: A Little Help needed climbing 3-5 steps with a railing? : A Lot 6 Click Score: 17    End of Session Equipment Utilized During Treatment: Gait belt Activity Tolerance: Patient tolerated treatment well Patient left: in bed;with call bell/phone within reach;with bed alarm set (Declined to sit in chair due to fatigue and request to nap)   PT Visit Diagnosis: Unsteadiness on feet (R26.81);Other abnormalities of gait and mobility (R26.89);Muscle weakness (generalized) (M62.81);Other symptoms and signs involving the nervous system (R29.898)    Time: 1610-9604 PT Time Calculation (min) (ACUTE ONLY): 36 min   Charges:   PT Evaluation $PT Eval Low Complexity: 1 Low PT Treatments $Therapeutic Activity: 8-22 mins PT General Charges $$ ACUTE PT VISIT: 1 Visit         Jory Ng, PT, DPT [facility]  [facility] Physical Therapist Office: [phone] Website: [email or web address]   Alinda Irani 06/06/2024, 10:11 AM

## 2024-06-06 NOTE — Assessment & Plan Note (Addendum)
 06-06-2024 White count 15 on presentation Suspect likely multifactorial contributions of active UTI as well as clinical dehydration as all cell lines appear elevated CT imaging w/ ? PNA vs. Atelectasis  No hypoxia at present  Check PCT to correlate  Urine cultures obtained in the ER Will monitor white count with treatment and hydration Follow  06-07-2024 awaiting labs for today. Hopeful that abx therapy will decrease/resolve leukocytosis.  If not decreasing, could be her decubitus wound on her buttock as another potential source.  06-08-2024 repeat labs today due to transfer back to hospital.  06-09-2024 resolved. WBC 8.5 today.

## 2024-06-06 NOTE — Assessment & Plan Note (Signed)
 Present on admission. Wound type: 1.  Deep Tissue Pressure Injury sacrum/buttocks with purple maroon discoloration  2.  R scapula deep tissue pressure injury purple maroon discoloration  3.  L scapula Stage 1Pressure Injury  4.  B heels Stage 1 pressure injuries  Pressure Injury POA: Yes Measurement: see nursing flowsheet  Wound bed: as above  Drainage (amount, consistency, odor) see nursing flowsheet  Periwound: appears to have chronic tissue damage to buttocks, per notes spends most of time sitting in recliner Dressing procedure/placement/frequency:  Cleanse buttocks/sacrum with soap and water, dry and apply Xeroform gauze ([place] [phone]) to areas of purple maroon discoloration daily and secure with silicone foam or ABD pad whichever is preferred.  Apply silicone foam to B scapula and B heels, lift to assess daily.

## 2024-06-06 NOTE — Assessment & Plan Note (Signed)
 Worsening weakness at home w/ uptrending WBC count and noted cystitis on CT imaging Will place on course of IV rocephin  for infectious coverage pending urine culture and decreased po intake  Transition to oral antibiotics pending urine culture and improvement in po intake

## 2024-06-06 NOTE — Assessment & Plan Note (Addendum)
 06-06-2024 Systolic BPs in low 100s  Mild exacerbation in setting of dehydration  Cont home midodrine   Monitor   06-07-2024 midodrine  had been started during her April 2025 hospitalization in order for her lopressor  to be uptitrated to control her HR from her rapid afib.  Will continue midodrine  BID.  06-08-2024 stable  06-09-2024 stable.  06-10-2024 stable. Continue 5 mg bid midodrine .

## 2024-06-06 NOTE — Assessment & Plan Note (Addendum)
 06-06-2024 Rate controlled at present  Cont home metoprolol  with hold parameters  Cont xarelto   Monitor   06-07-2024 stable. On Xarelto  and lopressor .  06-08-2024 stable  06-09-2024 stable. On xarelto  and lopressor   06-10-2024 stable.

## 2024-06-06 NOTE — Assessment & Plan Note (Signed)
>>  ASSESSMENT AND PLAN FOR UTI (URINARY TRACT INFECTION) WRITTEN ON 06/06/2024 10:52 AM BY Corrinne Din, MD  Worsening weakness at home w/ uptrending WBC count and noted cystitis on CT imaging Will place on course of IV rocephin  for infectious coverage pending urine culture and decreased po intake  Transition to oral antibiotics pending urine culture and improvement in po intake

## 2024-06-06 NOTE — Evaluation (Signed)
 Occupational Therapy Evaluation Patient Details Name: Stacy Valencia MRN: [id number] DOB: 11/06/1933 Today's Date: 06/06/2024   History of Present Illness   89 y.o. female presents to the emergency department 6/15 by EMS from home for evaluation of generalized weakness. CT abdomen pelvis is concerning for cystitis. UA is also concerning for UTI. PMH: A-fib, DVT, PE, acute cholecystitis.     Clinical Impressions PTA, pt lived with husband who assisted with ADL and transfers. Upon eval, pt is oriented to self, month, year, and place. Pt needing up to mod A for LB ADL this session and cues for attention to task/finishing tasks. Unsure how much cognitive assist pt husband provides for tasks, but suspect near baseline. Will continue to follow. Recommending HHOT for initial home safety eval.      If plan is discharge home, recommend the following:   A little help with walking and/or transfers;A little help with bathing/dressing/bathroom;Assistance with cooking/housework;Assist for transportation;Help with stairs or ramp for entrance     Functional Status Assessment   Patient has had a recent decline in their functional status and demonstrates the ability to make significant improvements in function in a reasonable and predictable amount of time.     Equipment Recommendations   None recommended by OT (pt has necessary DME)     Recommendations for Other Services         Precautions/Restrictions   Precautions Precautions: Fall Recall of Precautions/Restrictions: Impaired Restrictions Weight Bearing Restrictions Per Provider Order: No     Mobility Bed Mobility Overal bed mobility: Needs Assistance Bed Mobility: Rolling, Sidelying to Sit, Sit to Supine Rolling: Supervision Sidelying to sit: Min assist, Used rails   Sit to supine: Supervision   General bed mobility comments: Supervision to roll and return back to bed for safety. Min assist to pull through therapist's  hand to rise to EOB. Cues for technique. Effortful.    Transfers Overall transfer level: Needs assistance Equipment used: Rolling walker (2 wheels) Transfers: Sit to/from Stand, Bed to chair/wheelchair/BSC Sit to Stand: Min assist     Step pivot transfers: Contact guard assist     General transfer comment: Min assist for boost to stand from edge of bed, cues for hand placement, RW to steady upon rising.      Balance Overall balance assessment: Needs assistance Sitting-balance support: No upper extremity supported, Feet supported Sitting balance-Leahy Scale: Good     Standing balance support: Single extremity supported, During functional activity, Reliant on assistive device for balance Standing balance-Leahy Scale: Poor                             ADL either performed or assessed with clinical judgement   ADL Overall ADL's : Needs assistance/impaired Eating/Feeding: Set up;Sitting   Grooming: Set up;Sitting;Oral care   Upper Body Bathing: Set up;Sitting   Lower Body Bathing: Minimal assistance   Upper Body Dressing : Set up;Sitting   Lower Body Dressing: Moderate assistance;Sit to/from stand Lower Body Dressing Details (indicate cue type and reason): pt reluctant to don socks this session. needing mod A Toilet Transfer: Contact guard assist;Minimal assistance;Stand-pivot;Rolling walker (2 wheels) Toilet Transfer Details (indicate cue type and reason): min a for rise         Functional mobility during ADLs: Contact guard assist;Rolling walker (2 wheels);Minimal assistance       Vision Baseline Vision/History: 1 Wears glasses Ability to See in Adequate Light: 0 Adequate Patient Visual Report: No change  from baseline Additional Comments: able to locate grooming items on tray table.     Perception Perception: Not tested       Praxis Praxis: Not tested       Pertinent Vitals/Pain Pain Assessment Pain Assessment: No/denies pain      Extremity/Trunk Assessment Upper Extremity Assessment Upper Extremity Assessment: Generalized weakness   Lower Extremity Assessment Lower Extremity Assessment: Defer to PT evaluation   Cervical / Trunk Assessment Cervical / Trunk Assessment: Normal   Communication Communication Communication: Impaired Factors Affecting Communication: Hearing impaired   Cognition Arousal: Alert Behavior During Therapy: Lability Cognition: No family/caregiver present to determine baseline, History of cognitive impairments, Cognition impaired   Orientation impairments:  (did not know which hospital she was at) Awareness: Online awareness impaired Memory impairment (select all impairments): Short-term memory, Working memory Attention impairment (select first level of impairment): Selective attention, Sustained attention Executive functioning impairment (select all impairments): Organization, Problem solving OT - Cognition Comments: can sequence through basic tasks and follows one step commands, very pleasant and conversational                 Following commands: Impaired Following commands impaired: Follows multi-step commands with increased time, Follows multi-step commands inconsistently, Only follows one step commands consistently     Cueing  General Comments   Cueing Techniques: Verbal cues;Gestural cues  Urinary incontinence upon standing. Assisted with hygiene care. States she wears briefs at home   Exercises     Shoulder Instructions      Home Living Family/patient expects to be discharged to:: Private residence Living Arrangements: Spouse/significant other Available Help at Discharge: Family;Available 24 hours/day Type of Home: House Home Access: Ramped entrance     Home Layout: One level     Bathroom Shower/Tub: Producer, television/film/video: Handicapped height Bathroom Accessibility: Yes   Home Equipment: Agricultural consultant (2 wheels);Wheelchair -  manual;Shower seat;BSC/3in1;Grab bars - tub/shower;Hand held Sales executive;Toilet riser   Additional Comments: Pt lives at home with husband, has lift chair for standing.      Prior Functioning/Environment Prior Level of Function : Needs assist;History of Falls (last six months)             Mobility Comments: Uses rolling walker and lift chair. Husband helps with standing ADLs Comments: husband helps with all ADLs, bathing, transfers    OT Problem List: Decreased strength;Impaired balance (sitting and/or standing);Decreased activity tolerance;Decreased cognition;Decreased safety awareness;Decreased knowledge of use of DME or AE   OT Treatment/Interventions: Self-care/ADL training;Therapeutic exercise;DME and/or AE instruction;Therapeutic activities;Balance training;Patient/family education      OT Goals(Current goals can be found in the care plan section)   Acute Rehab OT Goals Patient Stated Goal: go home OT Goal Formulation: With patient Time For Goal Achievement: 06/20/24 Potential to Achieve Goals: Good   OT Frequency:  Min 1X/week    Co-evaluation              AM-PAC OT 6 Clicks Daily Activity     Outcome Measure Help from another person eating meals?: A Little Help from another person taking care of personal grooming?: A Little Help from another person toileting, which includes using toliet, bedpan, or urinal?: A Little Help from another person bathing (including washing, rinsing, drying)?: A Lot Help from another person to put on and taking off regular upper body clothing?: A Little Help from another person to put on and taking off regular lower body clothing?: A Lot 6 Click Score: 16   End  of Session Equipment Utilized During Treatment: Gait belt;Rolling walker (2 wheels) Nurse Communication: Mobility status  Activity Tolerance: Patient tolerated treatment well Patient left: in bed;with call bell/phone within reach;with bed alarm  set;Other (comment) ([facility] team in room)  OT Visit Diagnosis: Unsteadiness on feet (R26.81);Muscle weakness (generalized) (M62.81);Other symptoms and signs involving cognitive function                Time: 2355-7322 OT Time Calculation (min): 20 min Charges:  OT General Charges $OT Visit: 1 Visit OT Evaluation $OT Eval Low Complexity: 1 Low  Karilyn Ouch, OTR/L [facility] Acute Rehabilitation Office: [phone]   Emery Hans 06/06/2024, 11:37 AM

## 2024-06-06 NOTE — ED Notes (Signed)
 Pt refusing to ambulate at this time

## 2024-06-06 NOTE — ED Notes (Signed)
 Called floor to make them aware that the patient was coming, spoke to secretary advised she was coming, she transferred me to another lady, they said they didn't have room 30.

## 2024-06-06 NOTE — H&P (Addendum)
 [facility] History and Physical    Patient: Stacy Valencia ZOX:[id number] DOB: July 25, 1933 DOA: 06/05/2024 DOS: the patient was seen and examined on 06/06/2024 PCP: Renetta Carter, MD (Inactive)  Patient coming from: Home  Chief Complaint:  Chief Complaint  Patient presents with   Weakness   [name] with medical history significant of atrial fibrillation, hypotension on midodrine , PE DVT on anticoagulation, GERD presenting with weakness, fall, UTI.  Patient reports progressive weakness at home over the past 1 to 2 days.  Was using Tylenol  patient fell at toilet.  No reported head trauma or LOC.  Patient reports mild generalized weakness.  Also with decreased p.o. intake.  No fevers or chills.  Mild nausea.  No belly pain or diarrhea.  Decreased p.o. intake over multiple days.  Noted to have been admitted April 20 through April 24 for issues including choledocholithiasis with concern for early cholecystitis.  Had ERCP with biliary sphincterotomy and sphincteroplasty.  Patient declined cholecystectomy.  No reported right upper quadrant pain. Presented to the ER afebrile, hemodynamically stable.  Satting well on room air.  White count 15.3, hemoglobin 15, platelets 232, creatinine 0.9, AST, ALT as well as T. bili within normal limits.  Magnesium  1.4.  CT head within normal limits.  CT angio chest negative for PE but though showing?  Right lower lobe atelectasis versus early pneumonia.  CT abdomen pelvis with increased urinary bladder wall thickening. Review of Systems: As mentioned in the history of present illness. All other systems reviewed and are negative. Past Medical History:  Diagnosis Date   Acute cholecystitis 04/11/2024   Adjustment disorder with anxious mood    Atrial fibrillation (HCC) 11/19/2023   Atrial fibrillation with RVR (HCC) 11/20/2023   Choledocholithiasis 04/10/2024   DVT (deep venous thrombosis) (HCC) 01/07/2013   Femur fracture, left  (HCC) 07/25/2020   GERD (gastroesophageal reflux disease)    Hip fracture (HCC) 07/22/2020   PE (pulmonary embolism) 12/23/2003   Pulmonary embolism (HCC) 01/05/2013   Rhabdomyolysis 11/19/2023   Past Surgical History:  Procedure Laterality Date   ABDOMINAL HYSTERECTOMY     APPENDECTOMY     arthroscopic shoulder surgery Right 10/28/2016   decompression of SAD/DCR   BILIARY DILATION  04/13/2024   Procedure: DILATION, STRICTURE, BILE DUCT;  Surgeon: Lajuan Pila, MD;  Location: [facility] ENDOSCOPY;  Service: Gastroenterology;;   ERCP N/A 04/13/2024   Procedure: ERCP, WITH INTERVENTION IF INDICATED;  Surgeon: Lajuan Pila, MD;  Location: [facility] ENDOSCOPY;  Service: Gastroenterology;  Laterality: N/A;   INTRAMEDULLARY (IM) NAIL INTERTROCHANTERIC Left 07/23/2020   Procedure: INTRAMEDULLARY (IM) NAIL INTERTROCHANTRIC HIP;  Surgeon: Adonica Hoose, MD;  Location: [facility] OR;  Service: Orthopedics;  Laterality: Left;   Social History:  reports that she has never smoked. She has never used smokeless tobacco. She reports that she does not drink alcohol and does not use drugs.  Allergies  Allergen Reactions   Sulfa Antibiotics Nausea And Vomiting    Reaction only occurs with tablet formulations    Family History  Problem Relation Age of Onset   Pulmonary embolism Mother    Other Father        Head trauma   Emphysema Sister    Alcoholism Sister    Down syndrome Brother     Prior to Admission medications   Medication Sig Start Date End Date Taking? Authorizing Provider  acetaminophen  (TYLENOL ) 500 MG tablet Take 500 mg by mouth every 6 (six) hours as  needed for mild pain (pain score 1-3).   Yes [provider]  Cholecalciferol (VITAMIN D3) 1000 units CAPS Take 1,000 Units by mouth daily.   Yes [provider]  metoprolol  tartrate (LOPRESSOR ) 25 MG tablet Take 0.5 tablets (12.5 mg total) by mouth 2 (two) times daily. Patient taking differently: Take 12.5 mg by mouth See admin  instructions. Take 1/2 tablet (12.5mg ) by mouth twice daily, in the morning and early afternoon. 08/09/20  Yes Angiulli, Everlyn Hockey, PA-C  midodrine  (PROAMATINE ) 5 MG tablet Take 1 tablet (5 mg total) by mouth in the morning and at bedtime. Take 5 mg at 8AM and 5mg  at [facility] Patient taking differently: Take 5 mg by mouth See admin instructions. Take 1 tablet (5mg ) by mouth twice daily, in the morning and early afternoon. 03/21/24  Yes Turner, Rufus Council, MD  omeprazole  (PRILOSEC OTC) 20 MG tablet Take 20 mg by mouth daily.   Yes [provider]  XARELTO  20 MG TABS tablet TAKE 1 TABLET BY MOUTH EVERY DAY Patient taking differently: Take 20 mg by mouth See admin instructions. Take 1 tablet (20mg ) by mouth every day at 1830. 04/15/22  Yes Renna Cary, MD    Physical Exam: Vitals:   06/06/24 0440 06/06/24 0635 06/06/24 0825 06/06/24 1000  BP: (!) 107/57 (!) 101/57 109/64 98/70  Pulse: 80 81 90 99  Resp: 18 18 17    Temp: 98 F (36.7 C)  97.8 F (36.6 C)   SpO2: 100% 100% 97%    Physical Exam Constitutional:      Appearance: She is normal weight.  HENT:     Head: Normocephalic and atraumatic.     Nose: Nose normal.     Mouth/Throat:     Mouth: Mucous membranes are dry.   Eyes:     Pupils: Pupils are equal, round, and reactive to light.    Cardiovascular:     Rate and Rhythm: Normal rate and regular rhythm.  Pulmonary:     Effort: Pulmonary effort is normal.  Abdominal:     Palpations: Abdomen is soft.   Musculoskeletal:        General: Normal range of motion.     Comments: + mild generalized weakness     Skin:    General: Skin is dry.   Neurological:     General: No focal deficit present.     Data Reviewed:  There are no new results to review at this time.  CT ABDOMEN PELVIS W CONTRAST CLINICAL DATA:  89 year old female with altered mental status. Generalized weakness. Acute pain. History of ERCP and choledocholithiasis stone removal in April.  EXAM: CT  ABDOMEN AND PELVIS WITH CONTRAST  TECHNIQUE: Multidetector CT imaging of the abdomen and pelvis was performed using the standard protocol following bolus administration of intravenous contrast.  RADIATION DOSE REDUCTION: This exam was performed according to the departmental dose-optimization program which includes automated exposure control, adjustment of the mA and/or kV according to patient size and/or use of iterative reconstruction technique.  CONTRAST:  75mL OMNIPAQUE  IOHEXOL  350 MG/ML SOLN  COMPARISON:  CTA chest today. CT Abdomen and Pelvis 11/19/2023. Images from ERCP 04/13/2024.  FINDINGS: Lower chest: CTA chest today is reported separately. On these images and compared to the November CT Abdomen and Pelvis, lung base ventilation is more stable. Patchy bilateral lower lobe opacity more resembles atelectasis. Punctate right lower lobe calcified granuloma again noted.  Hepatobiliary: Small volume pneumobilia, within normal limits status post ERCP in April. Contracted gallbladder  also with a small volume of gas in the lumen (series 8 image 19). No convincing pericholecystic inflammation. No biliary ductal dilatation. Liver enhancement within normal limits.  Pancreas: Atrophied.  Spleen: Negative.  Adrenals/Urinary Tract: Normal adrenal glands. Kidneys are stable and nonobstructed, with symmetric and normal contrast excretion on the delayed excretory images. Diminutive ureters appear normal to the bladder. Occasional pelvic phleboliths. Mild nonspecific bladder wall thickening is increased compared to last year. And mild urinary wall inflammatory stranding is possible on series 8, image 72.  Stomach/Bowel: Nondilated large and small bowel loops. Diverticulosis of the descending and proximal sigmoid colon. No associated active inflammation. Mild large bowel retained stool throughout. Diminutive or absent appendix, no large bowel inflammation identified. Terminal ileum  within normal limits. Small gastric hiatal hernia is stable. Negative stomach and duodenum otherwise. No pneumoperitoneum, free fluid, or mesenteric inflammation identified.  Vascular/Lymphatic: Aortoiliac calcified atherosclerosis. Normal caliber abdominal aorta, mild tortuosity. Major arterial structures in the abdomen and pelvis remain patent despite widespread atherosclerosis. Portal venous system appears to be patent. No lymphadenopathy identified.  Reproductive: Diminutive or absent.  Other: No pelvis free fluid.  Musculoskeletal: Stable chronic T12, L2, L3 compression fractures since last year. Lumbar vertebrae again demonstrate vertebra plana and retropulsion. Mild chronic L4 inferior endplate deformity also is stable. Chronic left femur ORIF partially visible, appears stable. No acute osseous abnormality identified.  IMPRESSION: 1. Mild but increased urinary bladder wall thickening since last year. UTI/Cystitis not excluded. 2. CTA Chest reported separately today but lung bases on these images and compared to November CT Abdomen and Pelvis appear more stable, arguing in favor of atelectasis and scarring rather than acute lung base infection. 3. No other acute or inflammatory process identified in the abdomen or pelvis. Sequelae of ERCP in April. 4. Chronic lower thoracic and lumbar compression fractures. Aortic Atherosclerosis (ICD10-I70.0).  Electronically Signed   By: Marlise Simpers M.D.   On: 06/06/2024 04:27 CT Angio Chest PE W/Cm &/Or Wo Cm CLINICAL DATA:  89 year old female with altered mental status. Generalized weakness. Acute pain. History of ERCP and choledocholithiasis stone removal in April.  EXAM: CT ANGIOGRAPHY CHEST WITH CONTRAST  TECHNIQUE: Multidetector CT imaging of the chest was performed using the standard protocol during bolus administration of intravenous contrast. Multiplanar CT image reconstructions and MIPs were obtained to evaluate the  vascular anatomy.  RADIATION DOSE REDUCTION: This exam was performed according to the departmental dose-optimization program which includes automated exposure control, adjustment of the mA and/or kV according to patient size and/or use of iterative reconstruction technique.  CONTRAST:  75mL OMNIPAQUE  IOHEXOL  350 MG/ML SOLN  COMPARISON:  CTA chest 01/05/2013.  CT Abdomen and Pelvis today reported separately.  FINDINGS: Cardiovascular: Excellent contrast bolus timing in the pulmonary arterial tree. No central or left lung pulmonary artery filling defect. Mild respiratory motion, most pronounced at the right lung base where distal pulmonary artery branch detail is obscured. No right lung pulmonary artery filling defect identified.  Calcified coronary artery and aortic atherosclerosis. Heart size remains within normal limits. Tortuous thoracic aorta with little contrast in the aorta on this exam. No pericardial effusion.  Mediastinum/Nodes: Small mediastinal lymph nodes are stable since 2014, negative.  Lungs/Pleura: Major airways are patent. Slightly lower lung volumes compared to 2014. Since that time progressed symmetric bilateral pulmonary subpleural reticular opacity, scarring. Minor dependent atelectasis superimposed in the left lung. Increased elevation of the right hemidiaphragm since 2014 and right lower lobe more confluent, nonspecific peribronchial and confluent lung opacity. Punctate  calcified granuloma in the right lower lobe. No pleural effusion. No consolidation at this time.  Upper Abdomen: CT Abdomen and Pelvis today reported separately.  Musculoskeletal: More exaggerated thoracic kyphosis compared to 2014. Generalized osteopenia. Mild T2 inferior endplate compression fracture is chronic and stable. No acute or suspicious osseous lesion identified.  Review of the MIP images confirms the above findings.  IMPRESSION: 1. Negative for acute pulmonary embolus.  Mild respiratory motion at the right lung base. 2. Generalized increased pulmonary subpleural scarring since a 2014 CTA. Mild left lung atelectasis. More confluent right lower lobe opacity is indeterminate for atelectasis versus developing infection. No consolidation or pleural effusion. 3. CT Abdomen and Pelvis today reported separately. 4. Calcified coronary artery, Aortic Atherosclerosis (ICD10-I70.0).  Electronically Signed   By: Marlise Simpers M.D.   On: 06/06/2024 04:19 CT Head Wo Contrast CLINICAL DATA:  89 year old female with altered mental status. Generalized weakness.  EXAM: CT HEAD WITHOUT CONTRAST  TECHNIQUE: Contiguous axial images were obtained from the base of the skull through the vertex without intravenous contrast.  RADIATION DOSE REDUCTION: This exam was performed according to the departmental dose-optimization program which includes automated exposure control, adjustment of the mA and/or kV according to patient size and/or use of iterative reconstruction technique.  COMPARISON:  Head CT 11/19/2023.  FINDINGS: Brain: Stable cerebral volume, normal for age. No midline shift, ventriculomegaly, mass effect, evidence of mass lesion, intracranial hemorrhage or evidence of cortically based acute infarction. Patchy and confluent bilateral cerebral white matter hypodensity is stable. No cortical encephalomalacia identified.  Vascular: Calcified atherosclerosis at the skull base. No suspicious intracranial vascular hyperdensity.  Skull: Intact and stable.  No acute osseous abnormality identified.  Sinuses/Orbits: Chronic left mastoid air cell opacification is stable since last year. Other Visualized paranasal sinuses and mastoids are stable and well aerated.  Other: No acute orbit or scalp soft tissue finding identified.  IMPRESSION: 1. No acute intracranial abnormality. 2. Stable non contrast CT appearance of chronic white matter disease, and chronic left  mastoid air cell opacification.  Electronically Signed   By: Marlise Simpers M.D.   On: 06/06/2024 04:13  Lab Results  Component Value Date   WBC 15.3 (H) 06/06/2024   HGB 15.0 06/06/2024   HCT 44.6 06/06/2024   MCV 104.2 (H) 06/06/2024   PLT 232 06/06/2024   Last metabolic panel Lab Results  Component Value Date   GLUCOSE 115 (H) 06/06/2024   NA 135 06/06/2024   K 3.7 06/06/2024   CL 105 06/06/2024   CO2 21 (L) 06/06/2024   BUN 9 06/06/2024   CREATININE 0.89 06/06/2024   GFRNONAA >60 06/06/2024   CALCIUM  8.3 (L) 06/06/2024   PROT 5.7 (L) 06/06/2024   ALBUMIN 2.7 (L) 06/06/2024   BILITOT 1.1 06/06/2024   ALKPHOS 75 06/06/2024   AST 35 06/06/2024   ALT 10 06/06/2024   ANIONGAP 9 06/06/2024    Assessment and Plan: UTI (urinary tract infection) Worsening weakness at home w/ uptrending WBC count and noted cystitis on CT imaging Will place on course of IV rocephin  for infectious coverage pending urine culture and decreased po intake  Transition to oral antibiotics pending urine culture and improvement in po intake    Weakness Fall Dehydration  Progressive weakness and fall at home in setting of dehydration, UTI  Clinically dry on exam with worsened skin turgor and dry oral mucosa  No reported head trauma or LOC  Nonfocal neuro exam  Imaging negative for any acute fractures  PT  evaluated with recommendation for HHPT  Will plan to coordinate with [facility] TOC and PT  IVF hydration  Treat UTI  Monitor    Transient hypotension Systolic BPs in low 100s  Mild exacerbation in setting of dehydration  Cont home midodrine   Monitor    Leukocytosis White count 15 on presentation Suspect likely multifactorial contributions of active UTI as well as clinical dehydration as all cell lines appear elevated CT imaging w/ ? PNA vs. Atelectasis  No hypoxia at present  Check PCT to correlate  Urine cultures obtained in the ER Will monitor white count with treatment and  hydration Follow  Atrial fibrillation (HCC) Rate controlled at present  Cont home metoprolol  with hold parameters  Cont xarelto   Monitor   GERD (gastroesophageal reflux disease) Cont home prilosec        Advance Care Planning:   Code Status: Full Code   Consults: None   Family Communication: Plan of care including [facility] consult explained at length with the patient including husband Avonna Iribe ([phone]) with formal consent obtained.   Severity of Illness: The appropriate patient status for this patient is INPATIENT. Inpatient status is judged to be reasonable and necessary in order to provide the required intensity of service to ensure the patient's safety. The patient's presenting symptoms, physical exam findings, and initial radiographic and laboratory data in the context of their chronic comorbidities is felt to place them at high risk for further clinical deterioration. Furthermore, it is not anticipated that the patient will be medically stable for discharge from the hospital within 2 midnights of admission.   * I certify that at the point of admission it is my clinical judgment that the patient will require inpatient hospital care spanning beyond 2 midnights from the point of admission due to high intensity of service, high risk for further deterioration and high frequency of surveillance required.*  [facility] Admission Criteria Checklist:  Formal consent explained in detail and signed at the bedside:Yes- also done with husband on speakerphone  Patient meets inpatient admission criteria (see below for further details)  Is pt medicare FFS( required for initial launch with plan to expand)? Yes  Lives within 25 mil/ 30 min from [facility] within [place](pt may stay with family member during admission who lives within 25 miles or 30 min from [facility] w/in [facility]) ? Yes   Hemodynamically stable with relatively low risk of clinical deterioration-not requiring  ICU? Yes  Age >71?Yes  Does not require frequent touch-points or complex interventions/medications (ie Titrated Infusions (IV insulin, heparin  drips, vasoactive drips, use of infused or injectable controlled substances, patients on insulin)?  Any Behavioral Health comorbidities likely to increase risk for in-home care (ie Acute delirium or experiencing a marked altered mental status and cause is not a treatable condition in the home)? No   No active safety concerns (ie Unable to use bedside commode independently and lacks caregiver support for safety- needs SNF placement, unable to obtain IV access)?  Common admission diagnoses including: CAP, COPD Exacerbation, Acute on chronic heart failure, Cellulitis, UTI , dehydration, acute resp failure with hypoxia (requiring <5L)   Social Screening:  Denies significant ETOH intake? Yes  Does not smoke and understands may not smoke in the presence of oxygen ? yes  Patient states able to use iPad/phone for communication/has family who is able to use? yes  Any active drug use in patient or primary caregiver including daily dosing of methadone?  no Stable home environment (  access to appropriate heating in cold conditions and/or appropriate air conditioning in hot conditions and/or no running water/electricity)? yes  No aggressive pets at home? no Firearm present  with ability or willingness to store them unloaded in a locked case for duration of hospitalization? no  Ambulatory? Wheelchair/rolling walker  (independently cane, walker  wheelchair bound, bed bound) Bed bugs present on home evaluation?No   Needs oxygen  at home (<6L)? No  Family support system in place? yes  Patient feels safe at home does not endorse any violence? No reported violence  Any actively decompensated behavioral healthy issues including agitation/aggressive behavior? No    Patient requests food to be provided by hospital home program? Yes    To be admitted to the [facility] at [facility]  program, a patient generally must meet the following: 1. Requirement for Inpatient Level of Care: The patient's condition must necessitate an inpatient level of care. This is typically indicated by one or more of the following, depending on their specific diagnosis: -Persistent tachycardia despite appropriate treatment (e.g., for Heart Failure, UTI). - Persistent tachypnea (rapid breathing) or dyspnea (shortness of breath) that hasn't improved sufficiently with observation care (e.g., for Heart Failure, Pneumonia, Viral Illness, COVID). - Hypoxemia (low oxygen  levels), such as a new need for oxygen , an increased need from baseline, or specific oxygen  saturation levels (e.g., SpO2 <90-94% depending on the condition) that persist despite observation (e.g., for Heart Failure, COPD, Pneumonia, Viral Illness, COVID). - Need for Intravenous (IV) hydration due to an inability to maintain oral hydration, which persists despite observation care (e.g., for Cellulitis, UTI, Viral Illness, COVID). - Specific to Heart Failure: Persistent pulmonary edema, indicated by a new oxygen  need, lack of improvement with IV diuretics, and ongoing tachypnea/dyspnea. - Specific to COPD: A decrease in known baseline resting oxygen  saturation (SpO2) by 4% or more, or an increase in pre-existing supplemental oxygen  requirements, which persists despite observation and requires continued close monitoring. - Specific to Pneumonia: A Pneumonia Severity Index (PSI) class IV (moderate risk). - Specific to Cellulitis: Failure of outpatient antibiotic therapy (indicated by progression or no improvement after a minimum of 48 hours on an adequate regimen) or a clinical presentation (like acuity or rapidity of progression) that requires the intensity of monitoring found in an inpatient setting. - Specific to UTI: Persistence or worsening of clinical findings like fever, pain, or dehydration despite observation care; presence of  significant uropathy; suspected infection of an indwelling prosthetic device, stent, implant, or graft; or pregnancy with suspected pyelonephritis. 2. Appropriateness for Hospital at Home Setting: The patient's overall clinical picture, including the severity of their illness, their care needs, and their medical history and comorbidities, must be suitable for management in the [facility] Home environment. This essentially means that none of the exclusion criteria (listed below) are met.  Unified Exclusion Criteria for Hospital at Home Admission: A patient would not be eligible for Hospital at Home if any of the following are present: - Hemodynamic Instability: - Hypotension (low blood pressure) is present. - Respiratory Instability or Needs Beyond Program Capability: - There is a new need for invasive or noninvasive ventilatory assistance (like BiPAP or a ventilator). - Oxygenation is not sufficient, generally indicated if an FiO2 (fraction of inspired oxygen ) of 45% (which is about 6 Liters/minute via nasal cannula) or more is required to keep oxygen  saturation (SpO2) at 90% or greater. - Monitoring or Procedural Needs Beyond Program Capability: - There is a need for invasive monitoring, such as a pulmonary artery  catheter or an arterial line. - There is a need for immediate-response telemetry monitoring (for dangerous arrhythmia detection and subsequent immediate intervention). - The required medication regimen is beyond the capabilities of Hospital [facility] Home (e.g., dosing intervals are too frequent for home administration). - There is a need for a procedure that cannot be performed by the [facility] at [facility] team (e.g., significant wound debridement or abscess drainage for cellulitis, or percutaneous nephrostomy for a complicated UTI). - Significant Organ Dysfunction or Markers of Severe Illness: - Mental status is not at baseline, or there is altered mental status suggestive  of inadequate perfusion. - Renal (kidney) function is unstable or showing an ongoing decline. - There is evidence of inadequate perfusion, such as metabolic acidosis or myocardial ischemia. - Uncompensated acidosis is present. - Condition-Specific Severity or Complications Making Home Care Unsuitable: -For Heart Failure: Known severe cardiac valvular disease (e.g., aortic stenosis, mitral regurgitation); or severe peripheral edema that impairs the ability to urinate or ambulate. -For COPD: Known concurrent comorbidity or finding that indicates a higher-risk COPD exacerbation (e.g., pulmonary fibrosis, cavitation, pleural effusion, pneumothorax, rib fracture). -For Pneumonia: Pneumonia Severity Index (PSI) class V (indicating high risk for inpatient mortality); known concurrent comorbidity or finding that indicates higher-risk pneumonia (e.g., pulmonary fibrosis, cavitation, large or loculated pleural effusion); or a concomitant serious infectious process like endocarditis or empyema. -For Cellulitis: Orbital, periorbital, or necrotizing infection is suspected; or a concomitant serious infectious process like endocarditis, septic emboli, or septic joint space infection. -For UTI: Urinary tract obstruction (e.g., kidney stone, bladder outlet obstruction); or a concomitant serious infectious process like endocarditis or septic emboli. -For Viral Illness & COVID-19: A concomitant serious infectious process like endocarditis or empyema. General Comorbidities or Status: -The patient is significantly immunosuppressed (this applies to Pneumonia, Cellulitis, UTI, Viral Illness, and COVID-19). -The patient meets inpatient admission criteria for a second diagnosis, or has care needs beyond the capabilities of [facility] due to an active clinically significant comorbidity. (This is a general exclusion across all listed conditions)  Author: Corrinne Din, MD 06/06/2024 11:29 AM  For on  call review [email or web address].

## 2024-06-06 NOTE — Assessment & Plan Note (Addendum)
 06-06-2024 due to UTI and dehydration.  Progressive weakness and fall at home in setting of dehydration, UTI  Clinically dry on exam with worsened skin turgor and dry oral mucosa  No reported head trauma or LOC  Nonfocal neuro exam  Imaging negative for any acute fractures  PT evaluated with recommendation for HHPT  Will plan to coordinate with [facility] and PT  IVF hydration  Treat UTI  Monitor   06-07-2024 continue with ambulation at home with assistance of husband. Renewed orders for Home Health PT/OT. Will add home health RN and nurse aide.  06-08-2024 pt refusing to get out of bed while at [facility] program. Will get PT eval. Will likely need SNF placement.  06-09-2024 transferred back to in-house hospital unit due to weakness/ESBL Kleb UTI. Will need PT assessment. Husband stated yesterday he could not care for patient at home if she is not walking.  06-10-2024 needs SNF placement for rehab.

## 2024-06-06 NOTE — Assessment & Plan Note (Signed)
 On Xarelto

## 2024-06-06 NOTE — ED Notes (Signed)
 Pt refused xray

## 2024-06-07 DIAGNOSIS — R531 Weakness: Secondary | ICD-10-CM

## 2024-06-07 DIAGNOSIS — L89316 Pressure-induced deep tissue damage of right buttock: Secondary | ICD-10-CM

## 2024-06-07 DIAGNOSIS — E43 Unspecified severe protein-calorie malnutrition: Secondary | ICD-10-CM

## 2024-06-07 DIAGNOSIS — D72829 Elevated white blood cell count, unspecified: Secondary | ICD-10-CM

## 2024-06-07 DIAGNOSIS — I480 Paroxysmal atrial fibrillation: Secondary | ICD-10-CM

## 2024-06-07 DIAGNOSIS — E039 Hypothyroidism, unspecified: Secondary | ICD-10-CM

## 2024-06-07 LAB — COMPREHENSIVE METABOLIC PANEL WITH GFR
ALT: 11 U/L (ref 0–44)
AST: 48 U/L — ABNORMAL HIGH (ref 15–41)
Albumin: 2.6 g/dL — ABNORMAL LOW (ref 3.5–5.0)
Alkaline Phosphatase: 61 U/L (ref 38–126)
Anion gap: 7 (ref 5–15)
BUN: 9 mg/dL (ref 8–23)
CO2: 22 mmol/L (ref 22–32)
Calcium: 8.8 mg/dL — ABNORMAL LOW (ref 8.9–10.3)
Chloride: 108 mmol/L (ref 98–111)
Creatinine, Ser: 0.75 mg/dL (ref 0.44–1.00)
GFR, Estimated: >60 mL/min (ref >60)
Glucose, Bld: 94 mg/dL (ref 70–99)
Potassium: 4.1 mmol/L (ref 3.5–5.1)
Sodium: 137 mmol/L (ref 135–145)
Total Bilirubin: 1 mg/dL (ref 0.0–1.2)
Total Protein: 5.5 g/dL — ABNORMAL LOW (ref 6.5–8.1)

## 2024-06-07 LAB — CBC
HCT: 40 % (ref 36.0–46.0)
Hemoglobin: 13.5 g/dL (ref 12.0–15.0)
MCH: 34.6 pg — ABNORMAL HIGH (ref 26.0–34.0)
MCHC: 33.8 g/dL (ref 30.0–36.0)
MCV: 102.6 fL — ABNORMAL HIGH (ref 80.0–100.0)
Platelets: 221 10*3/uL (ref 150–400)
RBC: 3.9 MIL/uL (ref 3.87–5.11)
RDW: 14.6 % (ref 11.5–15.5)
WBC: 7 10*3/uL (ref 4.0–10.5)
nRBC: 0 % (ref 0.0–0.2)

## 2024-06-07 LAB — TSH: TSH: 9.764 u[IU]/mL — ABNORMAL HIGH (ref 0.350–4.500)

## 2024-06-07 LAB — T4, FREE: Free T4: 1.37 ng/dL — ABNORMAL HIGH (ref 0.61–1.12)

## 2024-06-07 LAB — MAGNESIUM: Magnesium: 1.9 mg/dL (ref 1.7–2.4)

## 2024-06-07 MED ORDER — GERHARDT'S BUTT CREAM
TOPICAL_CREAM | CUTANEOUS | Status: DC | PRN
  Administered 2024-06-08: 1 via TOPICAL

## 2024-06-07 MED ORDER — JUVEN PO PACK
1.0000 | PACK | Freq: Two times a day (BID) | ORAL | Status: DC
  Administered 2024-06-10: 1 via ORAL
  Filled 2024-06-07 (×7): qty 1

## 2024-06-07 MED ORDER — SODIUM CHLORIDE 0.9 % IV BOLUS
500.0000 mL | Freq: Once | INTRAVENOUS | Status: AC
  Administered 2024-06-07: 500 mL via INTRAVENOUS

## 2024-06-07 MED ORDER — GERHARDT'S BUTT CREAM
TOPICAL_CREAM | Freq: Two times a day (BID) | CUTANEOUS | Status: DC
  Filled 2024-06-07 (×2): qty 60

## 2024-06-07 MED ORDER — LEVOTHYROXINE SODIUM 50 MCG PO TABS
50.0000 ug | ORAL_TABLET | Freq: Every day | ORAL | Status: DC
  Filled 2024-06-07 (×2): qty 1

## 2024-06-07 MED FILL — Nutritional Supplement Liquid: ORAL | Qty: 330 | Status: AC

## 2024-06-07 MED FILL — Nutritional Supplement Pack: ORAL | Qty: 1 | Status: AC

## 2024-06-07 NOTE — Assessment & Plan Note (Signed)
 Nutrition Status: Nutrition Problem: Severe Malnutrition Etiology: social / environmental circumstances Signs/Symptoms: energy intake < or equal to 75% for > or equal to 1 month, percent weight loss (17% weight loss within 6 months) Percent weight loss: 17 % Interventions: Premier Protein, MVI, Education

## 2024-06-07 NOTE — Progress Notes (Signed)
+   mild agitation and weakness with noted decreased po intake  Blood sugar 90s  Discussed w/ nursing  Will give NS bolus x 1 Encourage po intake  Consider eval for placement if clinical condition persists/worsens Follow closely.

## 2024-06-07 NOTE — Progress Notes (Signed)
 At 1600 visit pt c/o feeling weaker vital signs stable 117/75 HR 90. Pt states she does remember being in the hospital at [facility]. Sarah, Paramedic was at the bedside. Pt able to answer questions correctly. Pt more agitated at the 4pm visit. Pt states that she has been sleeping all day and hasn't eating much today will notify MD

## 2024-06-07 NOTE — Discharge Instructions (Signed)

## 2024-06-07 NOTE — Progress Notes (Addendum)
 Initial Nutrition Assessment  DOCUMENTATION CODES:   Severe malnutrition in context of social or environmental circumstances  INTERVENTION:   Premier Protein BID (from home supply), each supplement provides 160 kcal and 30 gm protein. Multivitamin with minerals daily. Education provided on ways to increase protein and calorie intake as well as importance of high protein snacks/supplements between meals. Handout attached to discharge summary.   NUTRITION DIAGNOSIS:   Severe Malnutrition related to social / environmental circumstances as evidenced by energy intake < or equal to 75% for > or equal to 1 month, percent weight loss (17% weight loss within 6 months).  GOAL:   Patient will meet greater than or equal to 90% of their needs  MONITOR:   PO intake, Supplement acceptance, Skin  REASON FOR ASSESSMENT:   Consult Assessment of nutrition requirement/status  ASSESSMENT:   89 yo female admitted with weakness, fall, UTI. PMH includes GERD, pulmonary embolism, A fib, hypotension, chronic lower back compression fractures, choledocholithiasis S/P ERCP with biliary sphincterotomy and sphincteroplasty in April 2025.  Per discussion in multidisciplinary rounds this morning, patient has a wound to her buttocks. She does not like Boost supplements.   Per [facility] RN note, patient has DTIs to buttocks and R scapula, stage 1 PIs to L scapula and B heels.   Called to speak with patient; husband answered and we discussed her usual intake and nutrition hx. She was having blood drawn at the time of the call. He reports that patient has been eating poorly for the past 5 years, but it has worsened over the past week d/t UTI. She has lost ~25 lbs over the past few months. She usually wakes up around 9:30-10 and has a half bowl of instant oatmeal. It takes her about 2-2.5 hours to finish it. Lunch is around 12:30, when she eats a half sandwich, usually liverwurst and onions or pastrami and cheese, as well  as a dill pickle. It takes about 1.5 hours to finish lunch. Dinner is in the evening, husband cooks or they order take out, such as chicken marsala from an [place], and she'll eat ~1/4 of the meal before getting full. She tried Boost supplements a few years ago and doesn't like them. He has a few Ensure supplements in the refrigerator for her to try, but thinks she will not like them either. She does like chocolate Premier Protein drinks; husband bought a case yesterday. She also likes Slim Fast powder mixed with milk as a supplement. She takes a vitamin D3 supplement, but not a multivitamin. Husband plans to pick up a women's daily multivitamin at the store today.   Labs reviewed. Mag 1.4 Medications reviewed and include Gerhardt's butt cream.   Weight history reviewed. Usual weight 68 kg from January to April 2025. Currently 56.3 kg. 17% weight loss within the past 3-6 months is severe.   Patient meets criteria for severe malnutrition, given 17% weight loss within the past 6 months and intake that meets <75% of estimated energy requirement for > 1 month.  NUTRITION - FOCUSED PHYSICAL EXAM:  Unable to complete  Diet Order:   Diet Order             Diet regular Room service appropriate? Yes; Fluid consistency: Thin  Diet effective now                   EDUCATION NEEDS:   Education needs have been addressed  Skin:  Skin Assessment: Skin Integrity Issues: Skin Integrity Issues:: Stage  I, DTI DTI: R buttocks, R scapula Stage I: B heels, L scapula  Last BM:  6/15  Height:   Ht Readings from Last 1 Encounters:  06/07/24 5' 5 (1.651 m)    Weight:   Wt Readings from Last 1 Encounters:  06/07/24 56.3 kg    Ideal Body Weight:  56.8 kg  BMI:  Body mass index is 20.65 kg/m.  Estimated Nutritional Needs:   Kcal:  1400-1600  Protein:  75-85 gm  Fluid:  1.4-1.6 L   Barnet Boots RD, LDN, CNSC Contact via secure chat. If unavailable, use group chat RD  Inpatient.

## 2024-06-07 NOTE — Progress Notes (Addendum)
 Hospital[facility] at Home PROGRESS NOTE    Stacy Valencia  HYQ:[id number] DOB: 1933-10-20 DOA: 06/05/2024 PCP: Renetta Carter, MD (Inactive)  Subjective: Pt seen via video conference with paramedic Sarah at bedside. And virtual RN Siri Duet on video call. Pictures of buttock/sacral wounds examined.  IV rocephin  Dose #2 given.   BP stable now. Pt received IVF bolus at hospital prior to transfer to [facility] program to her home.  Paramedic states pt eat ready-to-eat meal last night. Has not had breakfast yet.   Provider Location:  [place], [place] Patient examined by: Isa Manuel, paramedic  [facility] Course: [name] with medical history significant of atrial fibrillation, hypotension on midodrine , PE DVT on anticoagulation, GERD presenting with weakness, fall, UTI.  Patient reports progressive weakness at home over the past 1 to 2 days.  Was using Tylenol  patient fell at toilet.  No reported head trauma or LOC.  Patient reports mild generalized weakness.  Also with decreased p.o. intake.  No fevers or chills.  Mild nausea.  No belly pain or diarrhea.  Decreased p.o. intake over multiple days.  Noted to have been admitted April 20 through April 24 for issues including choledocholithiasis with concern for early cholecystitis.  Had ERCP with biliary sphincterotomy and sphincteroplasty.  Patient declined cholecystectomy.  No reported right upper quadrant pain. Presented to the ER afebrile, hemodynamically stable.  Satting well on room air.  White count 15.3, hemoglobin 15, platelets 232, creatinine 0.9, AST, ALT as well as T. bili within normal limits.  Magnesium  1.4.  CT head within normal limits.  CT angio chest negative for PE but though showing?  Right lower lobe atelectasis versus early pneumonia.  CT abdomen pelvis with increased urinary bladder wall thickening  Significant Events: Admitted 06/05/2024 for UTI to Hospitalist at Home program   Admission Labs: WBC 15.3, HgB 15, Plt  232 Na 135, K 3.7, CO2 of 21, BUN 9, Scr 0.89, glu 115 UA cloudy, spg 1.009, nitrite negative, LE Large, WBC >50  Admission Imaging Studies: CT head No acute intracranial abnormality. 2. Stable non contrast CT appearance of chronic white matter disease, and chronic left mastoid air cell opacification CTPA Negative for acute pulmonary embolus. Mild respiratory motion at the right lung base. 2. Generalized increased pulmonary subpleural scarring since a 2014 CTA. Mild left lung atelectasis. More confluent right lower lobe opacity is indeterminate for atelectasis versus developing infection. No consolidation or pleural effusion. 3. CT Abdomen and Pelvis today reported separately. 4. Calcified coronary artery, Aortic Atherosclerosis CT abd/pelvis Mild but increased urinary bladder wall thickening since last year. UTI/Cystitis not excluded. 2. CTA Chest reported separately today but lung bases on these images and compared to November CT Abdomen and Pelvis appear more stable, arguing in favor of atelectasis and scarring rather than acute lung base infection. 3. No other acute or inflammatory process identified in the abdomen or pelvis. Sequelae of ERCP in April. 4. Chronic lower thoracic and lumbar compression fractures. Aortic Atherosclerosis   Significant Labs: Urine culture growing >=100,000 COLONIES/mL KLEBSIELLA PNEUMONIAE   Significant Imaging Studies:   Antibiotic Therapy: Anti-infectives (From admission, onward)    Start     Dose/Rate Route Frequency Ordered Stop   06/07/24 1000  cefTRIAXone  (ROCEPHIN ) 1 g in sodium chloride  0.9 % 100 mL IVPB        1 g 200 mL/hr over 30 Minutes Intravenous Daily 06/06/24 1637     06/06/24 0600  cefTRIAXone  (ROCEPHIN ) 1 g in sodium  chloride 0.9 % 100 mL IVPB  Status:  Discontinued        1 g 200 mL/hr over 30 Minutes Intravenous Daily 06/06/24 0533 06/06/24 1637       Procedures:   Consultants:     Assessment and Plan: * Acute cystitis without  hematuria 06-06-2024 Worsening weakness at home w/ uptrending WBC count and noted cystitis on CT imaging Will place on course of IV rocephin  for infectious coverage pending urine culture and decreased po intake  Transition to oral antibiotics pending urine culture and improvement in po intake   06-07-2024 continue with IV Rocephin . Day #2 of 3. Urine cx growing KLEBSIELLA PNEUMONIAE. Should have final MIC by tomorrow.  Pressure injury of skin 06-06-2024 Present on admission. Wound type: 1.  Deep Tissue Pressure Injury sacrum/buttocks with purple maroon discoloration  2.  R scapula deep tissue pressure injury purple maroon discoloration  3.  L scapula Stage 1Pressure Injury  4.  B heels Stage 1 pressure injuries  Pressure Injury POA: Yes Measurement: see nursing flowsheet  Wound bed: as above  Drainage (amount, consistency, odor) see nursing flowsheet  Periwound: appears to have chronic tissue damage to buttocks, per notes spends most of time sitting in recliner Dressing procedure/placement/frequency:  Cleanse buttocks/sacrum with soap and water, dry and apply Xeroform gauze ([place]son [phone]) to areas of purple maroon discoloration daily and secure with silicone foam or ABD pad whichever is preferred.  Apply silicone foam to B scapula and B heels, lift to assess daily.    06-07-2024 add Gerhardt butt cream. Continue local wound care. Offload onto her side and back as much as possible. Outpatient referral made to outpatient wound care. Paramedic to take pictures again tomorrow.  April 21-2025     June-16-2025     June-17-2025       Weakness 06-06-2024 due to UTI and dehydration.  Progressive weakness and fall at home in setting of dehydration, UTI  Clinically dry on exam with worsened skin turgor and dry oral mucosa  No reported head trauma or LOC  Nonfocal neuro exam  Imaging negative for any acute fractures  PT evaluated with recommendation for HHPT  Will plan to coordinate with  [facility] and PT  IVF hydration  Treat UTI  Monitor   06-07-2024 continue with ambulation at home with assistance of husband. Renewed orders for Home Health PT/OT. Will add home health RN and nurse aide.   Leukocytosis 06-06-2024 White count 15 on presentation Suspect likely multifactorial contributions of active UTI as well as clinical dehydration as all cell lines appear elevated CT imaging w/ ? PNA vs. Atelectasis  No hypoxia at present  Check PCT to correlate  Urine cultures obtained in the ER Will monitor white count with treatment and hydration Follow  06-07-2024 awaiting labs for today. Hopeful that abx therapy will decrease/resolve leukocytosis.  If not decreasing, could be her decubitus wound on her buttock as another potential source.  Chronic anticoagulation - for history of saddle PE in 2014 On Xarelto   History of pulmonary embolism - saddle PE in Jan 2014. on Xarelto  On Xarelto   Transient hypotension 06-06-2024 Systolic BPs in low 100s  Mild exacerbation in setting of dehydration  Cont home midodrine   Monitor   06-07-2024 midodrine  had been started during her April 2025 hospitalization in order for her lopressor  to be uptitrated to control her HR from her rapid afib.  Will continue midodrine  BID.  PAF (paroxysmal atrial fibrillation) (HCC) 06-06-2024 Rate controlled at  present  Cont home metoprolol  with hold parameters  Cont xarelto   Monitor   06-07-2024 stable. On Xarelto  and lopressor .  Adjustment disorder with anxious mood 06-06-2024 Minimal to mild agitation on presentation in setting of UTI and dehydration  CT head WNL  Monitor for now   06-07-2024 stable  GERD (gastroesophageal reflux disease) Cont home prilosec    Protein-calorie malnutrition, severe Nutrition Status: Nutrition Problem: Severe Malnutrition Etiology: social / environmental circumstances Signs/Symptoms: energy intake < or equal to 75% for > or equal to 1 month,  percent weight loss (17% weight loss within 6 months) Percent weight loss: 17 % Interventions: Premier Protein, MVI, Education  DVT prophylaxis:  rivaroxaban  (XARELTO ) tablet 20 mg     Code Status: Full Code Family Communication: husband in background at home Disposition Plan: home Reason for continuing need for hospitalization: remains on IV ABX.  Objective: Vitals:   06/06/24 2108 06/07/24 0756 06/07/24 1000 06/07/24 1030  BP: 137/77  119/71 119/71  Pulse: (!) 115  87 87  Resp:   16   Temp:   98.7 F (37.1 C)   TempSrc:   Oral   SpO2:   94%   Weight:   56.3 kg   Height:  5' 5 (1.651 m)     No intake or output data in the 24 hours ending 06/07/24 1302 Filed Weights   06/07/24 1000  Weight: 56.3 kg    Examination: Note that physical examination performed by paramedic on-scene and documented by provider Video Exam performed by video enabled technology  Physical Exam Vitals and nursing note reviewed.  Pulmonary:     Effort: No respiratory distress.   Skin:    Comments: See pictures of wound today.   Neurological:     Mental Status: She is alert and oriented to person, place, and time.          Data Reviewed: I have personally reviewed following labs and imaging studies  CBC: Recent Labs  Lab 06/06/24 0053 06/07/24 1110  WBC 15.3* 7.0  NEUTROABS 10.9*  --   HGB 15.0 13.5  HCT 44.6 40.0  MCV 104.2* 102.6*  PLT 232 221   Basic Metabolic Panel: Recent Labs  Lab 06/06/24 0053 06/06/24 0251 06/07/24 1110  NA 135  --  137  K 3.7  --  4.1  CL 105  --  108  CO2 21*  --  22  GLUCOSE 115*  --  94  BUN 9  --  9  CREATININE 0.89  --  0.75  CALCIUM  8.3*  --  8.8*  MG  --  1.4* 1.9   GFR: Estimated Creatinine Clearance: 42.4 mL/min (by C-G formula based on SCr of 0.75 mg/dL). Liver Function Tests: Recent Labs  Lab 06/06/24 0053 06/07/24 1110  AST 35 48*  ALT 10 11  ALKPHOS 75 61  BILITOT 1.1 1.0  PROT 5.7* 5.5*  ALBUMIN 2.7* 2.6*   BNP  (last 3 results) Recent Labs    11/19/23 0553  BNP 437.2*   Sepsis Labs: Recent Labs  Lab 06/06/24 0251  PROCALCITON <0.10    Recent Results (from the past 240 hours)  Urine Culture     Status: Abnormal (Preliminary result)   Collection Time: 06/06/24 12:37 AM   Specimen: Urine, Random  Result Value Ref Range Status   Specimen Description URINE, RANDOM  Final   Special Requests NONE Reflexed from [id number]  Final   Culture (A)  Final    >=100,000 COLONIES/mL KLEBSIELLA PNEUMONIAE  SUSCEPTIBILITIES TO FOLLOW Performed at [facility], [address] [place], [place] [address]    Report Status PENDING  Incomplete     Radiology Studies: CT ABDOMEN PELVIS W CONTRAST Result Date: 06/06/2024 CLINICAL DATA:  89 year old female with altered mental status. Generalized weakness. Acute pain. History of ERCP and choledocholithiasis stone removal in April. EXAM: CT ABDOMEN AND PELVIS WITH CONTRAST TECHNIQUE: Multidetector CT imaging of the abdomen and pelvis was performed using the standard protocol following bolus administration of intravenous contrast. RADIATION DOSE REDUCTION: This exam was performed according to the departmental dose-optimization program which includes automated exposure control, adjustment of the mA and/or kV according to patient size and/or use of iterative reconstruction technique. CONTRAST:  75mL OMNIPAQUE  IOHEXOL  350 MG/ML SOLN COMPARISON:  CTA chest today. CT Abdomen and Pelvis 11/19/2023. Images from ERCP 04/13/2024. FINDINGS: Lower chest: CTA chest today is reported separately. On these images and compared to the November CT Abdomen and Pelvis, lung base ventilation is more stable. Patchy bilateral lower lobe opacity more resembles atelectasis. Punctate right lower lobe calcified granuloma again noted. Hepatobiliary: Small volume pneumobilia, within normal limits status post ERCP in April. Contracted gallbladder also with a small volume of gas in the lumen (series 8  image 19). No convincing pericholecystic inflammation. No biliary ductal dilatation. Liver enhancement within normal limits. Pancreas: Atrophied. Spleen: Negative. Adrenals/Urinary Tract: Normal adrenal glands. Kidneys are stable and nonobstructed, with symmetric and normal contrast excretion on the delayed excretory images. Diminutive ureters appear normal to the bladder. Occasional pelvic phleboliths. Mild nonspecific bladder wall thickening is increased compared to last year. And mild urinary wall inflammatory stranding is possible on series 8, image 72. Stomach/Bowel: Nondilated large and small bowel loops. Diverticulosis of the descending and proximal sigmoid colon. No associated active inflammation. Mild large bowel retained stool throughout. Diminutive or absent appendix, no large bowel inflammation identified. Terminal ileum within normal limits. Small gastric hiatal hernia is stable. Negative stomach and duodenum otherwise. No pneumoperitoneum, free fluid, or mesenteric inflammation identified. Vascular/Lymphatic: Aortoiliac calcified atherosclerosis. Normal caliber abdominal aorta, mild tortuosity. Major arterial structures in the abdomen and pelvis remain patent despite widespread atherosclerosis. Portal venous system appears to be patent. No lymphadenopathy identified. Reproductive: Diminutive or absent. Other: No pelvis free fluid. Musculoskeletal: Stable chronic T12, L2, L3 compression fractures since last year. Lumbar vertebrae again demonstrate vertebra plana and retropulsion. Mild chronic L4 inferior endplate deformity also is stable. Chronic left femur ORIF partially visible, appears stable. No acute osseous abnormality identified. IMPRESSION: 1. Mild but increased urinary bladder wall thickening since last year. UTI/Cystitis not excluded. 2. CTA Chest reported separately today but lung bases on these images and compared to November CT Abdomen and Pelvis appear more stable, arguing in favor of  atelectasis and scarring rather than acute lung base infection. 3. No other acute or inflammatory process identified in the abdomen or pelvis. Sequelae of ERCP in April. 4. Chronic lower thoracic and lumbar compression fractures. Aortic Atherosclerosis (ICD10-I70.0). Electronically Signed   By: Marlise Simpers M.D.   On: 06/06/2024 04:27   CT Angio Chest PE W/Cm &/Or Wo Cm Result Date: 06/06/2024 CLINICAL DATA:  89 year old female with altered mental status. Generalized weakness. Acute pain. History of ERCP and choledocholithiasis stone removal in April. EXAM: CT ANGIOGRAPHY CHEST WITH CONTRAST TECHNIQUE: Multidetector CT imaging of the chest was performed using the standard protocol during bolus administration of intravenous contrast. Multiplanar CT image reconstructions and MIPs were obtained to evaluate the vascular anatomy. RADIATION DOSE REDUCTION:  This exam was performed according to the departmental dose-optimization program which includes automated exposure control, adjustment of the mA and/or kV according to patient size and/or use of iterative reconstruction technique. CONTRAST:  75mL OMNIPAQUE  IOHEXOL  350 MG/ML SOLN COMPARISON:  CTA chest 01/05/2013. CT Abdomen and Pelvis today reported separately. FINDINGS: Cardiovascular: Excellent contrast bolus timing in the pulmonary arterial tree. No central or left lung pulmonary artery filling defect. Mild respiratory motion, most pronounced at the right lung base where distal pulmonary artery branch detail is obscured. No right lung pulmonary artery filling defect identified. Calcified coronary artery and aortic atherosclerosis. Heart size remains within normal limits. Tortuous thoracic aorta with little contrast in the aorta on this exam. No pericardial effusion. Mediastinum/Nodes: Small mediastinal lymph nodes are stable since 2014, negative. Lungs/Pleura: Major airways are patent. Slightly lower lung volumes compared to 2014. Since that time progressed symmetric  bilateral pulmonary subpleural reticular opacity, scarring. Minor dependent atelectasis superimposed in the left lung. Increased elevation of the right hemidiaphragm since 2014 and right lower lobe more confluent, nonspecific peribronchial and confluent lung opacity. Punctate calcified granuloma in the right lower lobe. No pleural effusion. No consolidation at this time. Upper Abdomen: CT Abdomen and Pelvis today reported separately. Musculoskeletal: More exaggerated thoracic kyphosis compared to 2014. Generalized osteopenia. Mild T2 inferior endplate compression fracture is chronic and stable. No acute or suspicious osseous lesion identified. Review of the MIP images confirms the above findings. IMPRESSION: 1. Negative for acute pulmonary embolus. Mild respiratory motion at the right lung base. 2. Generalized increased pulmonary subpleural scarring since a 2014 CTA. Mild left lung atelectasis. More confluent right lower lobe opacity is indeterminate for atelectasis versus developing infection. No consolidation or pleural effusion. 3. CT Abdomen and Pelvis today reported separately. 4. Calcified coronary artery, Aortic Atherosclerosis (ICD10-I70.0). Electronically Signed   By: Marlise Simpers M.D.   On: 06/06/2024 04:19   CT Head Wo Contrast Result Date: 06/06/2024 CLINICAL DATA:  89 year old female with altered mental status. Generalized weakness. EXAM: CT HEAD WITHOUT CONTRAST TECHNIQUE: Contiguous axial images were obtained from the base of the skull through the vertex without intravenous contrast. RADIATION DOSE REDUCTION: This exam was performed according to the departmental dose-optimization program which includes automated exposure control, adjustment of the mA and/or kV according to patient size and/or use of iterative reconstruction technique. COMPARISON:  Head CT 11/19/2023. FINDINGS: Brain: Stable cerebral volume, normal for age. No midline shift, ventriculomegaly, mass effect, evidence of mass lesion,  intracranial hemorrhage or evidence of cortically based acute infarction. Patchy and confluent bilateral cerebral white matter hypodensity is stable. No cortical encephalomalacia identified. Vascular: Calcified atherosclerosis at the skull base. No suspicious intracranial vascular hyperdensity. Skull: Intact and stable.  No acute osseous abnormality identified. Sinuses/Orbits: Chronic left mastoid air cell opacification is stable since last year. Other Visualized paranasal sinuses and mastoids are stable and well aerated. Other: No acute orbit or scalp soft tissue finding identified. IMPRESSION: 1. No acute intracranial abnormality. 2. Stable non contrast CT appearance of chronic white matter disease, and chronic left mastoid air cell opacification. Electronically Signed   By: Marlise Simpers M.D.   On: 06/06/2024 04:13    Scheduled Meds:  Gerhardt's butt cream   Topical BID   metoprolol  tartrate  12.5 mg Oral BID   midodrine   5 mg Oral BID WC   nutrition supplement (JUVEN)  1 packet Oral BID BM   rivaroxaban   20 mg Oral Daily   Continuous Infusions:  cefTRIAXone  (ROCEPHIN )  IV Stopped (  06/07/24 1038)     LOS: 1 day   Time spent: 60 minutes  Unk Garb, DO  [facility]  06/07/2024, 1:02 PM

## 2024-06-07 NOTE — TOC Progression Note (Addendum)
 Transition of Care [facility]) - Progression Note    Patient Details  Name: Stacy Valencia MRN: [id number] Date of Birth: 1933/07/21  Transition of Care [facility]) CM/SW Contact  Joanette Moynahan, RN Phone Number: 06/07/2024, 11:37 AM  Clinical Narrative:    RNCM reached out to spouse, Porfirio Bristol regarding switiching Home [employer].  Porfirio Bristol reports that he LOVES [facility] PT, OT and RN (professionals as he call it), he does not like the AID that he pays for (private pay [employer]). He wants to stay with [facility] home health, but is working on finding another private pay agency.    Expected Discharge Plan: Home w Home Health Services Barriers to Discharge: Continued Medical Work up  Expected Discharge Plan and Services   Discharge Planning Services: CM Consult   Living arrangements for the past 2 months: Single Family Home                           HH Arranged: PT, OT [facility] Agency: [facility] Date [facility] Agency Contacted: 06/06/24 Time HH Agency Contacted: 1435 Representative spoke with at [facility] Agency: Randel Buss   Social Determinants of Health (SDOH) Interventions SDOH Screenings   Food Insecurity: No Food Insecurity (06/06/2024)  Housing: Low Risk  (06/06/2024)  Transportation Needs: No Transportation Needs (06/06/2024)  Utilities: Not At Risk (04/10/2024)  Social Connections: Moderately Integrated (06/06/2024)  Tobacco Use: Low Risk  (06/06/2024)    Readmission Risk Interventions    04/14/2024    2:39 PM  Readmission Risk Prevention Plan  Post Dischage Appt Complete  Medication Screening Complete  Transportation Screening Complete

## 2024-06-07 NOTE — Subjective & Objective (Addendum)
 Pt seen and examined. Pt had to be transferred back to hospital due to worsening mentation and urine cx growing ESBL Klebsiella requiring more intensive IV ABX that can be provided at [facility] program.

## 2024-06-07 NOTE — Progress Notes (Signed)
 Phone call to set up virtual visit. Husband spoke with me and took phone to patient. She refused to speak over the phone or video conference. Reports she is sleeping and there is nothing wrong with her. Husband reports she ate well this evening and he has no concerns. Offered physical visit to check blood pressure and check on her after a bad day  but both patient and husband refused. No medications to be given per BP parameters.

## 2024-06-07 NOTE — Assessment & Plan Note (Addendum)
 06-07-2024. TSH elevated at 9.76. starting synthroid 50 mcg daily. Will need TSH and FT4 repeated in 6 weeks by PCP.  06-08-2024 TSH elevated at 9.76 but Free T4 is elevated at 1.37. may be sick euthyroid syndrome. Will stop synthroid for now. Will need repeat TSH and FT4 in 6 weeks when she is not ill.

## 2024-06-08 DIAGNOSIS — N390 Urinary tract infection, site not specified: Secondary | ICD-10-CM

## 2024-06-08 DIAGNOSIS — F4322 Adjustment disorder with anxiety: Secondary | ICD-10-CM

## 2024-06-08 DIAGNOSIS — B9689 Other specified bacterial agents as the cause of diseases classified elsewhere: Secondary | ICD-10-CM

## 2024-06-08 DIAGNOSIS — E039 Hypothyroidism, unspecified: Secondary | ICD-10-CM

## 2024-06-08 LAB — URINE CULTURE: Culture: >=100000 — AB

## 2024-06-08 MED ORDER — SODIUM CHLORIDE 0.9 % IV SOLN: 1.0000 g | INTRAVENOUS | Status: DC

## 2024-06-08 MED ORDER — MELATONIN 5 MG PO TABS
10.0000 mg | ORAL_TABLET | Freq: Every day | ORAL | Status: DC
  Filled 2024-06-08 (×2): qty 2

## 2024-06-08 MED ORDER — SODIUM CHLORIDE 0.9 % IV SOLN
1.0000 g | Freq: Two times a day (BID) | INTRAVENOUS | Status: AC
  Administered 2024-06-08 – 2024-06-09 (×4): 1 g via INTRAVENOUS
  Filled 2024-06-08 (×4): qty 20

## 2024-06-08 MED ORDER — MIRTAZAPINE 15 MG PO TABS
15.0000 mg | ORAL_TABLET | Freq: Every day | ORAL | Status: DC
  Filled 2024-06-08 (×2): qty 1

## 2024-06-08 NOTE — Progress Notes (Signed)
 Husband called office again to verify no medications to be given tonight. Confirmed and explained medication not to be given due to BP parameters.

## 2024-06-08 NOTE — Progress Notes (Signed)
 This RN talked to the patient's husband over the phone. He said the patient has been in the bed since yesterday. She is on her back and is refusing to move around. The last time she ate was approximately 1800 yesterday. The husband cannot get her to eat this morning. [facility] team made aware.

## 2024-06-08 NOTE — Plan of Care (Signed)

## 2024-06-08 NOTE — Progress Notes (Signed)
 PROGRESS NOTE    Stacy Valencia  WUJ:[id number] DOB: [birth year] DOA: 06/05/2024 PCP: Renetta Carter, MD (Inactive)  Subjective: Pt seen and examined. Pt had to be transferred back to hospital due to worsening mentation and urine cx growing ESBL Klebsiella requiring more intensive IV ABX that can be provided at [facility] program.   Hospital Course: [name] with medical history significant of atrial fibrillation, hypotension on midodrine , PE DVT on anticoagulation, GERD presenting with weakness, fall, UTI.  Patient reports progressive weakness at home over the past 1 to 2 days.  Was using Tylenol  patient fell at toilet.  No reported head trauma or LOC.  Patient reports mild generalized weakness.  Also with decreased p.o. intake.  No fevers or chills.  Mild nausea.  No belly pain or diarrhea.  Decreased p.o. intake over multiple days.  Noted to have been admitted April 20 through April 24 for issues including choledocholithiasis with concern for early cholecystitis.  Had ERCP with biliary sphincterotomy and sphincteroplasty.  Patient declined cholecystectomy.  No reported right upper quadrant pain. Presented to the ER afebrile, hemodynamically stable.  Satting well on room air.  White count 15.3, hemoglobin 15, platelets 232, creatinine 0.9, AST, ALT as well as T. bili within normal limits.  Magnesium  1.4.  CT head within normal limits.  CT angio chest negative for PE but though showing?  Right lower lobe atelectasis versus early pneumonia.  CT abdomen pelvis with increased urinary bladder wall thickening  Significant Events: Admitted 06/05/2024 for [facility]TI to [facility] 06-08-2024 had to be transferred back to hospital due to worsening mentation and urine cx growing ESBL Klebsiella requiring more intensive IV ABX that can be provided at [facility] program.  Admission Labs: WBC 15.3, HgB 15, Plt 232 Na 135, K 3.7, CO2 of 21, BUN 9, Scr 0.89, glu 115 UA cloudy, spg  1.009, nitrite negative, LE Large, WBC >50  Admission Imaging Studies: CT head No acute intracranial abnormality. 2. Stable non contrast CT appearance of chronic white matter disease, and chronic left mastoid air cell opacification CTPA Negative for acute pulmonary embolus. Mild respiratory motion at the right lung base. 2. Generalized increased pulmonary subpleural scarring since a 2014 CTA. Mild left lung atelectasis. More confluent right lower lobe opacity is indeterminate for atelectasis versus developing infection. No consolidation or pleural effusion. 3. CT Abdomen and Pelvis today reported separately. 4. Calcified coronary artery, Aortic Atherosclerosis CT abd/pelvis Mild but increased urinary bladder wall thickening since last year. UTI/Cystitis not excluded. 2. CTA Chest reported separately today but lung bases on these images and compared to November CT Abdomen and Pelvis appear more stable, arguing in favor of atelectasis and scarring rather than acute lung base infection. 3. No other acute or inflammatory process identified in the abdomen or pelvis. Sequelae of ERCP in April. 4. Chronic lower thoracic and lumbar compression fractures. Aortic Atherosclerosis   Significant Labs: Urine culture growing >=100,000 COLONIES/mL KLEBSIELLA PNEUMONIAE  Urine cx shows ESBL Klebsiella  Significant Imaging Studies:   Antibiotic Therapy: Anti-infectives (From admission, onward)    Start     Dose/Rate Route Frequency Ordered Stop   06/07/24 1000  cefTRIAXone  (ROCEPHIN ) 1 g in sodium chloride  0.9 % 100 mL IVPB        1 g 200 mL/hr over 30 Minutes Intravenous Daily 06/06/24 1637     06/06/24 0600  cefTRIAXone  (ROCEPHIN ) 1 g in sodium chloride  0.9 % 100 mL IVPB  Status:  Discontinued        1 g 200 mL/hr over 30 Minutes Intravenous Daily 06/06/24 0533 06/06/24 1637       Procedures:   Consultants:     Assessment and Plan: * Urinary tract infection due to ESBL Klebsiella 06-06-2024  Worsening weakness at home w/ uptrending WBC count and noted cystitis on CT imaging Will place on course of IV rocephin  for infectious coverage pending urine culture and decreased po intake  Transition to oral antibiotics pending urine culture and improvement in po intake   06-07-2024 continue with IV Rocephin . Day #2 of 3. Urine cx growing KLEBSIELLA PNEUMONIAE. Should have final MIC by tomorrow.  06-08-2024 urine cx growing ESBL Klebsiella. Pt transferred back to [facility] hospital for more intensive IV ABX. Change to IV meropenem.  Pressure injury of skin 06-06-2024 Present on admission. Wound type: 1.  Deep Tissue Pressure Injury sacrum/buttocks with purple maroon discoloration  2.  R scapula deep tissue pressure injury purple maroon discoloration  3.  L scapula Stage 1Pressure Injury  4.  B heels Stage 1 pressure injuries  Pressure Injury POA: Yes Measurement: see nursing flowsheet  Wound bed: as above  Drainage (amount, consistency, odor) see nursing flowsheet  Periwound: appears to have chronic tissue damage to buttocks, per notes spends most of time sitting in recliner Dressing procedure/placement/frequency:  Cleanse buttocks/sacrum with soap and water, dry and apply Xeroform gauze (Lawson [id number]) to areas of purple maroon discoloration daily and secure with silicone foam or ABD pad whichever is preferred.  Apply silicone foam to B scapula and B heels, lift to assess daily.    06-07-2024 add Gerhardt butt cream. Continue local wound care. Offload onto her side and back as much as possible. Outpatient referral made to outpatient wound care. Paramedic to take pictures again tomorrow.  06-08-2024 continue Gerhard butt cream. Wounds appear improved on pictures today.  April 21-2025     June-16-2025     June-17-2025     June 08, 2024       Weakness 06-06-2024 due to UTI and dehydration.  Progressive weakness and fall at home in setting of dehydration, UTI  Clinically dry on exam with  worsened skin turgor and dry oral mucosa  No reported head trauma or LOC  Nonfocal neuro exam  Imaging negative for any acute fractures  PT evaluated with recommendation for HHPT  Will plan to coordinate with [facility] and PT  IVF hydration  Treat UTI  Monitor   06-07-2024 continue with ambulation at home with assistance of husband. Renewed orders for Home Health PT/OT. Will add home health RN and nurse aide.  06-08-2024 pt refusing to get out of bed while at [facility] program. Will get PT eval. Will likely need SNF placement.   Acquired hypothyroidism 06-07-2024. TSH elevated at 9.76. starting synthroid 50 mcg daily. Will need TSH and FT4 repeated in 6 weeks by PCP.  06-08-2024 TSH elevated at 9.76 but Free T4 is elevated at 1.37. may be sick euthyroid syndrome. Will stop synthroid for now. Will need repeat TSH and FT4 in 6 weeks when she is not ill.  Leukocytosis 06-06-2024 White count 15 on presentation Suspect likely multifactorial contributions of active UTI as well as clinical dehydration as all cell lines appear elevated CT imaging w/ ? PNA vs. Atelectasis  No hypoxia at present  Check PCT to correlate  Urine cultures obtained in the ER Will monitor white count with treatment and hydration Follow  06-07-2024 awaiting labs  for today. Hopeful that abx therapy will decrease/resolve leukocytosis.  If not decreasing, could be her decubitus wound on her buttock as another potential source.  06-08-2024 repeat labs today due to transfer back to hospital.  Protein-calorie malnutrition, severe Nutrition Status: Nutrition Problem: Severe Malnutrition Etiology: social / environmental circumstances Signs/Symptoms: energy intake < or equal to 75% for > or equal to 1 month, percent weight loss (17% weight loss within 6 months) Percent weight loss: 17 % Interventions: Premier Protein, MVI, Education  Chronic anticoagulation - for history of saddle PE in 2014 On  Xarelto   History of pulmonary embolism - saddle PE in Jan 2014. on Xarelto  On Xarelto   Transient hypotension 06-06-2024 Systolic BPs in low 100s  Mild exacerbation in setting of dehydration  Cont home midodrine   Monitor   06-07-2024 midodrine  had been started during her April 2025 hospitalization in order for her lopressor  to be uptitrated to control her HR from her rapid afib.  Will continue midodrine  BID.  06-08-2024 stable  PAF (paroxysmal atrial fibrillation) (HCC) 06-06-2024 Rate controlled at present  Cont home metoprolol  with hold parameters  Cont xarelto   Monitor   06-07-2024 stable. On Xarelto  and lopressor .  06-08-2024 stable  Adjustment disorder with anxious mood 06-06-2024 Minimal to mild agitation on presentation in setting of UTI and dehydration  CT head WNL  Monitor for now   06-07-2024 stable  06-08-2024 add remeron at bedside for her agitation and also to help her appetite. Remeron 15 mg at bedtime.  GERD (gastroesophageal reflux disease) Cont home prilosec    DVT prophylaxis: rivaroxaban  (XARELTO ) tablet 20 mg Start: 06/06/24 1300 rivaroxaban  (XARELTO ) tablet 20 mg     Code Status: Full Code Family Communication: no family at bedside Disposition Plan: SNF Reason for continuing need for hospitalization: needs IV meropenem  Objective: Vitals:   06/07/24 2300 06/08/24 0822 06/08/24 1200 06/08/24 1402  BP:  121/64 130/79 110/76  Pulse:  91 85 86  Resp:   18 18  Temp:   97.8 F (36.6 C) 98.1 F (36.7 C)  TempSrc:   Oral Oral  SpO2: 90%  94% 96%  Weight:      Height:       No intake or output data in the 24 hours ending 06/08/24 1435 Filed Weights   06/07/24 1000  Weight: 56.3 kg    Examination:  Physical Exam Vitals and nursing note reviewed.  Constitutional:      General: She is not in acute distress.    Appearance: She is not toxic-appearing.  HENT:     Head: Normocephalic and atraumatic.     Nose: Nose normal.   Eyes:      General: No scleral icterus.   Cardiovascular:     Rate and Rhythm: Regular rhythm. Tachycardia present.  Pulmonary:     Effort: Pulmonary effort is normal.     Breath sounds: Normal breath sounds.  Abdominal:     General: Abdomen is flat. There is no distension.     Palpations: Abdomen is soft.   Musculoskeletal:     Right lower leg: No edema.     Left lower leg: No edema.   Skin:    General: Skin is warm and dry.     Capillary Refill: Capillary refill takes less than 2 seconds.   Neurological:     Mental Status: She is alert and oriented to person, place, and time.     Data Reviewed: I have personally reviewed following labs and imaging studies  CBC: Recent Labs  Lab 06/06/24 0053 06/07/24 1110  WBC 15.3* 7.0  NEUTROABS 10.9*  --   HGB 15.0 13.5  HCT 44.6 40.0  MCV 104.2* 102.6*  PLT 232 221   Basic Metabolic Panel: Recent Labs  Lab 06/06/24 0053 06/06/24 0251 06/07/24 1110  NA 135  --  137  K 3.7  --  4.1  CL 105  --  108  CO2 21*  --  22  GLUCOSE 115*  --  94  BUN 9  --  9  CREATININE 0.89  --  0.75  CALCIUM  8.3*  --  8.8*  MG  --  1.4* 1.9   GFR: Estimated Creatinine Clearance: 42.4 mL/min (by C-G formula based on SCr of 0.75 mg/dL). Liver Function Tests: Recent Labs  Lab 06/06/24 0053 06/07/24 1110  AST 35 48*  ALT 10 11  ALKPHOS 75 61  BILITOT 1.1 1.0  PROT 5.7* 5.5*  ALBUMIN 2.7* 2.6*   BNP (last 3 results) Recent Labs    11/19/23 0553  BNP 437.2*   Thyroid  Function Tests: Recent Labs    06/07/24 1109 06/07/24 1110  TSH  --  9.764*  FREET4 1.37*  --    Sepsis Labs: Recent Labs  Lab 06/06/24 0251  PROCALCITON <0.10    Recent Results (from the past 240 hours)  Urine Culture     Status: Abnormal   Collection Time: 06/06/24 12:37 AM   Specimen: Urine, Random  Result Value Ref Range Status   Specimen Description URINE, RANDOM  Final   Special Requests   Final    NONE Reflexed from [id number] Performed at [facility], [address] [place], [place] [address]    Culture (A)  Final    >=100,000 COLONIES/mL KLEBSIELLA PNEUMONIAE Confirmed Extended Spectrum Beta-Lactamase Producer (ESBL).  In bloodstream infections from ESBL organisms, carbapenems are preferred over piperacillin/tazobactam. They are shown to have a lower risk of mortality.    Report Status 06/08/2024 FINAL  Final   Organism ID, Bacteria KLEBSIELLA PNEUMONIAE (A)  Final      Susceptibility   Klebsiella pneumoniae - MIC*    AMPICILLIN >=32 RESISTANT Resistant     CEFAZOLIN  >=64 RESISTANT Resistant     CEFEPIME >=32 RESISTANT Resistant     CEFTRIAXONE  >=64 RESISTANT Resistant     CIPROFLOXACIN  <=0.25 SENSITIVE Sensitive     GENTAMICIN <=1 SENSITIVE Sensitive     IMIPENEM 0.5 SENSITIVE Sensitive     NITROFURANTOIN  64 INTERMEDIATE Intermediate     TRIMETH/SULFA >=320 RESISTANT Resistant     AMPICILLIN/SULBACTAM >=32 RESISTANT Resistant     PIP/TAZO <=4 SENSITIVE Sensitive ug/mL    * >=100,000 COLONIES/mL KLEBSIELLA PNEUMONIAE     Scheduled Meds:  Gerhardt's butt cream   Topical BID   metoprolol  tartrate  12.5 mg Oral BID   midodrine   5 mg Oral BID WC   mirtazapine  15 mg Oral QHS   nutrition supplement (JUVEN)  1 packet Oral BID BM   rivaroxaban   20 mg Oral Daily   Continuous Infusions:   LOS: 2 days   Time spent:  Unk Garb, DO  [facility]  06/08/2024, 2:35 PM

## 2024-06-08 NOTE — TOC Progression Note (Signed)
 Transition of Care [facility]) - Progression Note    Patient Details  Name: JENNALEE GREAVES MRN: [id number] Date of Birth: 1933-04-26  [facility] CM/SW Contact  Joanette Moynahan, RN Phone Number: 06/08/2024, 7:54 AM  Clinical Narrative:    RNCM called wound care center to confirm receipt of referral.  Clinic will review notes and reach out to pt to schedule appointment.   Expected Discharge Plan: Home w Home Health Services Barriers to Discharge: Continued Medical Work up  Expected Discharge Plan and Services   Discharge Planning Services: CM Consult   Living arrangements for the past 2 months: Single Family Home                           HH Arranged: PT, OT [facility] Agency: [facility] Date [facility] Agency Contacted: 06/06/24 Time HH Agency Contacted: 1435 Representative spoke with at [facility] Agency: Randel Buss   Social Determinants of Health (SDOH) Interventions SDOH Screenings   Food Insecurity: No Food Insecurity (06/06/2024)  Housing: Low Risk  (06/06/2024)  Transportation Needs: No Transportation Needs (06/06/2024)  Utilities: Not At Risk (04/10/2024)  Social Connections: Moderately Integrated (06/06/2024)  Tobacco Use: Low Risk  (06/06/2024)    Readmission Risk Interventions    04/14/2024    2:39 PM  Readmission Risk Prevention Plan  Post Dischage Appt Complete  Medication Screening Complete  Transportation Screening Complete

## 2024-06-08 NOTE — Progress Notes (Signed)
 Husband called office to verify there were no medications to be given tonight. Confirmed.

## 2024-06-08 NOTE — Progress Notes (Signed)
 Pharmacy Antibiotic Note  Stacy Valencia is a 89 y.o. female admitted on 06/05/2024 with UTI.  Pharmacy has been consulted for meropenem dosing.  Plan: Meropenem 500mg  IV Q12h Trend WBC, fever, renal function F/u cultures, clinical progress, levels as indicated De-escalate when able  Height: 5' 5 (165.1 cm) Weight: 56.3 kg (124 lb 1.9 oz) IBW/kg (Calculated) : 57  Temp (24hrs), Avg:97.9 F (36.6 C), Min:97.8 F (36.6 C), Max:98.1 F (36.7 C)  Recent Labs  Lab 06/06/24 0053 06/07/24 1110  WBC 15.3* 7.0  CREATININE 0.89 0.75    Estimated Creatinine Clearance: 42.4 mL/min (by C-G formula based on SCr of 0.75 mg/dL).    Allergies  Allergen Reactions   Sulfa Antibiotics Nausea And Vomiting    Reaction only occurs with tablet formulations    Microbiology results: 6/16 UCx: ESBL klebsiella pneumo    Thank you for allowing pharmacy to be a part of this patient's care.  Claudia Cuff, PharmD, BCPS Clinical Pharmacist

## 2024-06-08 NOTE — Plan of Care (Signed)
 Patient refusing care. Required spoon feeding by staff today. Breakdown to buttocks, care done and cream applied. Incontinent of urine and requiring assistance to the bathroom.

## 2024-06-08 NOTE — Progress Notes (Signed)
 Arrived at Pt's home for initial visit of the day. Vital signs obtained and iv med administered as noted. Pt has soiled herself and urinated on herself multiple times. Pt's husband reports Pt declined every time he tried to take her to the bathroom last night and this morning. I asked Pt if she can get up with my assistance to go to the bathroom and she declined stating she just wants me to clean her up. Pt is able to answer all my questions appropriately, but appears agitated and is non-compliant with [facility] program requirements including agreeing to virtual RN visits, and recommendations to ambulate and trying to turn to stay off of healing wound. Pt given choice to go back to [facility] or sign out AMA. Pt's husband states if she signs out AMA she will have to be placed into a SNF immediately because he is unable to care for her in her current state. Pt's daughter, Mariah Shines, spoke with pt on the phone to reiterate Pt's choices. Pt elected to be escalated back to [facility] with plan for transition to SNF after discharge. I cleaned BM and urine from pt, cleaned her sacral wound, reapplied Gerhardt's cream and mepilex pad, and put incontinence brief on Pt. Pt's gown was changed and she was assisted to stand and pivot from bed to wheelchair. Pt transported via [employer] wheelchair [place] back to [employer] by EMT/RN/[facility] surgeon.

## 2024-06-08 NOTE — Progress Notes (Signed)
 Pt refused to have labs drawn. Discussed the purpose and importance of labs with pt and she continued to refuse stating that's not my problem. They stuck me earlier this afternoon and they're not sticking me again. Waited 30 min's and attempted again with no change; continued to refuse.

## 2024-06-09 DIAGNOSIS — Z7901 Long term (current) use of anticoagulants: Secondary | ICD-10-CM

## 2024-06-09 DIAGNOSIS — Z86711 Personal history of pulmonary embolism: Secondary | ICD-10-CM

## 2024-06-09 DIAGNOSIS — E43 Unspecified severe protein-calorie malnutrition: Secondary | ICD-10-CM

## 2024-06-09 LAB — BASIC METABOLIC PANEL WITH GFR
Anion gap: 7 (ref 5–15)
BUN: 7 mg/dL — ABNORMAL LOW (ref 8–23)
CO2: 23 mmol/L (ref 22–32)
Calcium: 8.8 mg/dL — ABNORMAL LOW (ref 8.9–10.3)
Chloride: 105 mmol/L (ref 98–111)
Creatinine, Ser: 0.83 mg/dL (ref 0.44–1.00)
GFR, Estimated: >60 mL/min (ref >60)
Glucose, Bld: 89 mg/dL (ref 70–99)
Potassium: 4 mmol/L (ref 3.5–5.1)
Sodium: 135 mmol/L (ref 135–145)

## 2024-06-09 LAB — CBC WITH DIFFERENTIAL/PLATELET
Abs Immature Granulocytes: 0.09 10*3/uL — ABNORMAL HIGH (ref 0.00–0.07)
Basophils Absolute: 0.1 10*3/uL (ref 0.0–0.1)
Basophils Relative: 1 %
Eosinophils Absolute: 0.2 10*3/uL (ref 0.0–0.5)
Eosinophils Relative: 3 %
HCT: 41.2 % (ref 36.0–46.0)
Hemoglobin: 13.9 g/dL (ref 12.0–15.0)
Immature Granulocytes: 1 %
Lymphocytes Relative: 39 %
Lymphs Abs: 3.3 10*3/uL (ref 0.7–4.0)
MCH: 34.6 pg — ABNORMAL HIGH (ref 26.0–34.0)
MCHC: 33.7 g/dL (ref 30.0–36.0)
MCV: 102.5 fL — ABNORMAL HIGH (ref 80.0–100.0)
Monocytes Absolute: 0.9 10*3/uL (ref 0.1–1.0)
Monocytes Relative: 11 %
Neutro Abs: 3.8 10*3/uL (ref 1.7–7.7)
Neutrophils Relative %: 45 %
Platelets: 216 10*3/uL (ref 150–400)
RBC: 4.02 MIL/uL (ref 3.87–5.11)
RDW: 14.4 % (ref 11.5–15.5)
WBC: 8.5 10*3/uL (ref 4.0–10.5)
nRBC: 0 % (ref 0.0–0.2)

## 2024-06-09 LAB — MAGNESIUM: Magnesium: 1.9 mg/dL (ref 1.7–2.4)

## 2024-06-09 LAB — VITAMIN B1: Vitamin B1 (Thiamine): 99.1 nmol/L (ref 66.5–200.0)

## 2024-06-09 MED ORDER — CALCIUM CARBONATE ANTACID 500 MG PO CHEW
2.0000 | CHEWABLE_TABLET | Freq: Four times a day (QID) | ORAL | Status: DC | PRN
  Administered 2024-06-09: 400 mg via ORAL
  Filled 2024-06-09: qty 2

## 2024-06-09 MED ORDER — ACETAMINOPHEN 500 MG PO TABS: 1000.0000 mg | ORAL_TABLET | Freq: Three times a day (TID) | ORAL | Status: DC | PRN

## 2024-06-09 MED ORDER — ACETAMINOPHEN 650 MG RE SUPP: 650.0000 mg | Freq: Four times a day (QID) | RECTAL | Status: DC | PRN

## 2024-06-09 MED ORDER — CIPROFLOXACIN HCL 500 MG PO TABS
500.0000 mg | ORAL_TABLET | Freq: Two times a day (BID) | ORAL | Status: DC
  Administered 2024-06-10: 500 mg via ORAL
  Filled 2024-06-09 (×3): qty 1

## 2024-06-09 MED ORDER — PANTOPRAZOLE SODIUM 40 MG PO TBEC
40.0000 mg | DELAYED_RELEASE_TABLET | Freq: Every day | ORAL | Status: DC
  Administered 2024-06-09: 40 mg via ORAL
  Filled 2024-06-09: qty 1

## 2024-06-09 MED ORDER — ADULT MULTIVITAMIN W/MINERALS CH
1.0000 | ORAL_TABLET | Freq: Every day | ORAL | Status: DC
  Administered 2024-06-10: 1 via ORAL
  Filled 2024-06-09 (×2): qty 1

## 2024-06-09 NOTE — Progress Notes (Signed)
 PROGRESS NOTE    Stacy Valencia  NWG:[id number] DOB: [birth year] DOA: 06/05/2024 PCP: Renetta Carter, MD (Inactive)  Subjective: Pt seen and examined.  Pt is awake and alert. Oriented x 4. Likes to drink juice but not eat her food.  Buttock wound looking better. On IV meropenem for ESBL Klebsiella. On contact precautions.   Hospital Course: [name] with medical history significant of atrial fibrillation, hypotension on midodrine , PE DVT on anticoagulation, GERD presenting with weakness, fall, UTI.  Patient reports progressive weakness at home over the past 1 to 2 days.  Was using Tylenol  patient fell at toilet.  No reported head trauma or LOC.  Patient reports mild generalized weakness.  Also with decreased p.o. intake.  No fevers or chills.  Mild nausea.  No belly pain or diarrhea.  Decreased p.o. intake over multiple days.  Noted to have been admitted April 20 through April 24 for issues including choledocholithiasis with concern for early cholecystitis.  Had ERCP with biliary sphincterotomy and sphincteroplasty.  Patient declined cholecystectomy.  No reported right upper quadrant pain. Presented to the ER afebrile, hemodynamically stable.  Satting well on room air.  White count 15.3, hemoglobin 15, platelets 232, creatinine 0.9, AST, ALT as well as T. bili within normal limits.  Magnesium  1.4.  CT head within normal limits.  CT angio chest negative for PE but though showing?  Right lower lobe atelectasis versus early pneumonia.  CT abdomen pelvis with increased urinary bladder wall thickening  Significant Events: Admitted 06/05/2024 [facility]TI [facility] 06-08-2024 had to be transferred back to hospital due to worsening mentation and urine cx growing ESBL Klebsiella requiring more intensive IV ABX that can be provided at [facility] program.  Admission Labs: WBC 15.3, HgB 15, Plt 232 Na 135, K 3.7, CO2 of 21, BUN 9, Scr 0.89, glu 115 UA cloudy, spg  1.009, nitrite negative, LE Large, WBC >50  Admission Imaging Studies: CT head No acute intracranial abnormality. 2. Stable non contrast CT appearance of chronic white matter disease, and chronic left mastoid air cell opacification CTPA Negative for acute pulmonary embolus. Mild respiratory motion at the right lung base. 2. Generalized increased pulmonary subpleural scarring since a 2014 CTA. Mild left lung atelectasis. More confluent right lower lobe opacity is indeterminate for atelectasis versus developing infection. No consolidation or pleural effusion. 3. CT Abdomen and Pelvis today reported separately. 4. Calcified coronary artery, Aortic Atherosclerosis CT abd/pelvis Mild but increased urinary bladder wall thickening since last year. UTI/Cystitis not excluded. 2. CTA Chest reported separately today but lung bases on these images and compared to November CT Abdomen and Pelvis appear more stable, arguing in favor of atelectasis and scarring rather than acute lung base infection. 3. No other acute or inflammatory process identified in the abdomen or pelvis. Sequelae of ERCP in April. 4. Chronic lower thoracic and lumbar compression fractures. Aortic Atherosclerosis   Significant Labs: Urine culture growing >=100,000 COLONIES/mL KLEBSIELLA PNEUMONIAE  Urine cx shows ESBL Klebsiella  Significant Imaging Studies:   Antibiotic Therapy: Anti-infectives (From admission, onward)    Start     Dose/Rate Route Frequency Ordered Stop   06/07/24 1000  cefTRIAXone  (ROCEPHIN ) 1 g in sodium chloride  0.9 % 100 mL IVPB        1 g 200 mL/hr over 30 Minutes Intravenous Daily 06/06/24 1637     06/06/24 0600  cefTRIAXone  (ROCEPHIN ) 1 g in sodium chloride  0.9 % 100 mL IVPB  Status:  Discontinued        1 g 200 mL/hr over 30 Minutes Intravenous Daily 06/06/24 0533 06/06/24 1637       Procedures:   Consultants:     Assessment and Plan: * Urinary tract infection due to ESBL Klebsiella 06-06-2024  Worsening weakness at home w/ uptrending WBC count and noted cystitis on CT imaging Will place on course of IV rocephin  for infectious coverage pending urine culture and decreased po intake  Transition to oral antibiotics pending urine culture and improvement in po intake   06-07-2024 continue with IV Rocephin . Day #2 of 3. Urine cx growing KLEBSIELLA PNEUMONIAE. Should have final MIC by tomorrow.  06-08-2024 urine cx growing ESBL Klebsiella. Pt transferred back to [facility] hospital for more intensive IV ABX. Change to IV meropenem.  06-09-2024 pt back on in-house hospital unit. On IV meropenem Day #2. Will ask ID pharmacist about duration of therapy.  Pressure injury of skin 06-06-2024 Present on admission. Wound type: 1.  Deep Tissue Pressure Injury sacrum/buttocks with purple maroon discoloration  2.  R scapula deep tissue pressure injury purple maroon discoloration  3.  L scapula Stage 1Pressure Injury  4.  B heels Stage 1 pressure injuries  Pressure Injury POA: Yes Measurement: see nursing flowsheet  Wound bed: as above  Drainage (amount, consistency, odor) see nursing flowsheet  Periwound: appears to have chronic tissue damage to buttocks, per notes spends most of time sitting in recliner Dressing procedure/placement/frequency:  Cleanse buttocks/sacrum with soap and water, dry and apply Xeroform gauze ([place] [phone]) to areas of purple maroon discoloration daily and secure with silicone foam or ABD pad whichever is preferred.  Apply silicone foam to B scapula and B heels, lift to assess daily.    06-07-2024 add Gerhardt butt cream. Continue local wound care. Offload onto her side and back as much as possible. Outpatient referral made to outpatient wound care. Paramedic to take pictures again tomorrow.  06-08-2024 continue Gerhard butt cream. Wounds appear improved on pictures today.  16-19-2025 continue Gerhardt's butt cream. Wound looking better  April 21-2025     June-16-2025      June-17-2025     June 08, 2024     June 09, 2024       Weakness 06-06-2024 due to UTI and dehydration.  Progressive weakness and fall at home in setting of dehydration, UTI  Clinically dry on exam with worsened skin turgor and dry oral mucosa  No reported head trauma or LOC  Nonfocal neuro exam  Imaging negative for any acute fractures  PT evaluated with recommendation for HHPT  Will plan to coordinate with [facility] and PT  IVF hydration  Treat UTI  Monitor   06-07-2024 continue with ambulation at home with assistance of husband. Renewed orders for Home Health PT/OT. Will add home health RN and nurse aide.  06-08-2024 pt refusing to get out of bed while at [facility] program. Will get PT eval. Will likely need SNF placement.  06-09-2024 transferred back to in-house hospital unit due to weakness/ESBL Kleb UTI. Will need PT assessment. Husband stated yesterday he could not care for patient at home if she is not walking.   Acquired hypothyroidism 06-07-2024. TSH elevated at 9.76. starting synthroid 50 mcg daily. Will need TSH and FT4 repeated in 6 weeks by PCP.  06-08-2024 TSH elevated at 9.76 but Free T4 is elevated at 1.37. may be sick euthyroid syndrome. Will stop synthroid for now. Will need repeat TSH and  FT4 in 6 weeks when she is not ill.  Leukocytosis-resolved as of 06/09/2024 06-06-2024 White count 15 on presentation Suspect likely multifactorial contributions of active UTI as well as clinical dehydration as all cell lines appear elevated CT imaging w/ ? PNA vs. Atelectasis  No hypoxia at present  Check PCT to correlate  Urine cultures obtained in the ER Will monitor white count with treatment and hydration Follow  06-07-2024 awaiting labs for today. Hopeful that abx therapy will decrease/resolve leukocytosis.  If not decreasing, could be her decubitus wound on her buttock as another potential source.  06-08-2024 repeat labs today due to transfer back to  hospital.  06-09-2024 resolved. WBC 8.5 today.  Protein-calorie malnutrition, severe Nutrition Status: Nutrition Problem: Severe Malnutrition Etiology: social / environmental circumstances Signs/Symptoms: energy intake < or equal to 75% for > or equal to 1 month, percent weight loss (17% weight loss within 6 months) Percent weight loss: 17 % Interventions: Premier Protein, MVI, Education  Chronic anticoagulation - for history of saddle PE in 2014 On Xarelto   History of pulmonary embolism - saddle PE in Jan 2014. on Xarelto  On Xarelto   Transient hypotension 06-06-2024 Systolic BPs in low 100s  Mild exacerbation in setting of dehydration  Cont home midodrine   Monitor   06-07-2024 midodrine  had been started during her April 2025 hospitalization in order for her lopressor  to be uptitrated to control her HR from her rapid afib.  Will continue midodrine  BID.  06-08-2024 stable  06-09-2024 stable.  PAF (paroxysmal atrial fibrillation) (HCC) 06-06-2024 Rate controlled at present  Cont home metoprolol  with hold parameters  Cont xarelto   Monitor   06-07-2024 stable. On Xarelto  and lopressor .  06-08-2024 stable  06-09-2024 stable. On xarelto  and lopressor   Adjustment disorder with anxious mood 06-06-2024 Minimal to mild agitation on presentation in setting of UTI and dehydration  CT head WNL  Monitor for now   06-07-2024 stable  06-08-2024 add remeron at bedtime for her agitation and also to help her appetite. Remeron 15 mg at bedtime.  06-09-2024 continue with remeron at bedtime.  GERD (gastroesophageal reflux disease) Cont home prilosec    DVT prophylaxis: rivaroxaban  (XARELTO ) tablet 20 mg Start: 06/06/24 1300 rivaroxaban  (XARELTO ) tablet 20 mg     Code Status: Full Code Family Communication: no family at bedside. Husband has discussed SNF placement with CM Disposition Plan: SNF Reason for continuing need for hospitalization: remains on IV ABX  meropenem.  Objective: Vitals:   06/08/24 2011 06/08/24 2219 06/09/24 0426 06/09/24 0830  BP: 120/70 120/70 124/87 (!) 104/51  Pulse: 78 78 76 82  Resp: 19  19 19   Temp: 98.2 F (36.8 C)  97.9 F (36.6 C) 97.7 F (36.5 C)  TempSrc:      SpO2: 96%  95% 94%  Weight:   59.4 kg   Height:        Intake/Output Summary (Last 24 hours) at 06/09/2024 1200 Last data filed at 06/09/2024 0855 Gross per 24 hour  Intake 505.49 ml  Output --  Net 505.49 ml   Filed Weights   06/07/24 1000 06/09/24 0426  Weight: 56.3 kg 59.4 kg    Examination:  Physical Exam Vitals and nursing note reviewed.  HENT:     Head: Normocephalic and atraumatic.   Cardiovascular:     Rate and Rhythm: Normal rate.  Pulmonary:     Effort: Pulmonary effort is normal.     Breath sounds: Normal breath sounds.  Abdominal:     General: Abdomen is  flat. There is no distension.     Palpations: Abdomen is soft.   Musculoskeletal:     Right lower leg: No edema.     Left lower leg: No edema.   Skin:    General: Skin is warm and dry.     Capillary Refill: Capillary refill takes less than 2 seconds.     Comments: See pictures of wound on buttocks   Neurological:     Mental Status: She is alert and oriented to person, place, and time.        Data Reviewed: I have personally reviewed following labs and imaging studies  CBC: Recent Labs  Lab 06/06/24 0053 06/07/24 1110 06/09/24 0331  WBC 15.3* 7.0 8.5  NEUTROABS 10.9*  --  3.8  HGB 15.0 13.5 13.9  HCT 44.6 40.0 41.2  MCV 104.2* 102.6* 102.5*  PLT 232 221 216   Basic Metabolic Panel: Recent Labs  Lab 06/06/24 0053 06/06/24 0251 06/07/24 1110 06/09/24 0331  NA 135  --  137 135  K 3.7  --  4.1 4.0  CL 105  --  108 105  CO2 21*  --  22 23  GLUCOSE 115*  --  94 89  BUN 9  --  9 7*  CREATININE 0.89  --  0.75 0.83  CALCIUM  8.3*  --  8.8* 8.8*  MG  --  1.4* 1.9 1.9   GFR: Estimated Creatinine Clearance: 41.3 mL/min (by C-G formula based on  SCr of 0.83 mg/dL). Liver Function Tests: Recent Labs  Lab 06/06/24 0053 06/07/24 1110  AST 35 48*  ALT 10 11  ALKPHOS 75 61  BILITOT 1.1 1.0  PROT 5.7* 5.5*  ALBUMIN 2.7* 2.6*   BNP (last 3 results) Recent Labs    11/19/23 0553  BNP 437.2*   Thyroid  Function Tests: Recent Labs    06/07/24 1109 06/07/24 1110  TSH  --  9.764*  FREET4 1.37*  --    Sepsis Labs: Recent Labs  Lab 06/06/24 0251  PROCALCITON <0.10    Recent Results (from the past 240 hours)  Urine Culture     Status: Abnormal   Collection Time: 06/06/24 12:37 AM   Specimen: Urine, Random  Result Value Ref Range Status   Specimen Description URINE, RANDOM  Final   Special Requests   Final    NONE Reflexed from [id number] Performed at [facility], [address], [place], [place] [address]    Culture (A)  Final    >=100,000 COLONIES/mL KLEBSIELLA PNEUMONIAE Confirmed Extended Spectrum Beta-Lactamase Producer (ESBL).  In bloodstream infections from ESBL organisms, carbapenems are preferred over piperacillin/tazobactam. They are shown to have a lower risk of mortality.    Report Status 06/08/2024 FINAL  Final   Organism ID, Bacteria KLEBSIELLA PNEUMONIAE (A)  Final      Susceptibility   Klebsiella pneumoniae - MIC*    AMPICILLIN >=32 RESISTANT Resistant     CEFAZOLIN  >=64 RESISTANT Resistant     CEFEPIME >=32 RESISTANT Resistant     CEFTRIAXONE  >=64 RESISTANT Resistant     CIPROFLOXACIN  <=0.25 SENSITIVE Sensitive     GENTAMICIN <=1 SENSITIVE Sensitive     IMIPENEM 0.5 SENSITIVE Sensitive     NITROFURANTOIN  64 INTERMEDIATE Intermediate     TRIMETH/SULFA >=320 RESISTANT Resistant     AMPICILLIN/SULBACTAM >=32 RESISTANT Resistant     PIP/TAZO <=4 SENSITIVE Sensitive ug/mL    * >=100,000 COLONIES/mL KLEBSIELLA PNEUMONIAE     Radiology Studies: No results found.  Scheduled  Meds:  Gerhardt's butt cream   Topical BID   melatonin  10 mg Oral QHS   metoprolol  tartrate  12.5 mg Oral BID    midodrine   5 mg Oral BID WC   mirtazapine  15 mg Oral QHS   multivitamin with minerals  1 tablet Oral Daily   nutrition supplement (JUVEN)  1 packet Oral BID BM   pantoprazole   40 mg Oral Daily   rivaroxaban   20 mg Oral Daily   Continuous Infusions:  meropenem (MERREM) IV 1 g (06/09/24 0843)     LOS: 3 days   Time spent: 55 minutes  Unk Garb, DO  [facility]  06/09/2024, 12:00 PM

## 2024-06-09 NOTE — Plan of Care (Signed)

## 2024-06-09 NOTE — TOC Progression Note (Addendum)
 Transition of Care [facility]) - Progression Note    Patient Details  Name: Stacy Valencia MRN: [id number] Date of Birth: 1933-06-20  [facility] CM/SW Contact  Dane Dung, RN Phone Number: 06/09/2024, 9:57 AM  Clinical Narrative:    CM called and spoke with the patient's spouse by phone and he states that he is unable to care for the patient at the home until the patient is atleast able to walk to the bedside commode or bathroom.  Also, husband states that the sacral wound is a concern for him as well.  The patient spouse was agreeable to SNF placement.  Patient's spouse states he was not happy with [facility] and would refuse placement at the facility.  [name], LCSW is aware that patient's spouse would like SNF placement. [name] LCSw plans to speak with the patient at the bedside regarding plan to short term placement at nursing facility.   Expected Discharge Plan: Home w Home Health Services Barriers to Discharge: Continued Medical Work up  Expected Discharge Plan and Services   Discharge Planning Services: CM Consult   Living arrangements for the past 2 months: Single Family Home                           HH Arranged: PT, OT [facility] Agency: [facility] Date [facility] Agency Contacted: 06/06/24 Time HH Agency Contacted: 1435 Representative spoke with at [facility] Agency: Randel Buss   Social Determinants of Health (SDOH) Interventions SDOH Screenings   Food Insecurity: No Food Insecurity (06/06/2024)  Housing: Low Risk  (06/06/2024)  Transportation Needs: No Transportation Needs (06/06/2024)  Utilities: Not At Risk (06/08/2024)  Social Connections: Moderately Integrated (06/06/2024)  Tobacco Use: Low Risk  (06/06/2024)    Readmission Risk Interventions    04/14/2024    2:39 PM  Readmission Risk Prevention Plan  Post Dischage Appt Complete  Medication Screening Complete  Transportation Screening Complete

## 2024-06-09 NOTE — Plan of Care (Signed)
  Problem: Education: Goal: Knowledge of General Education information will improve Description: Including pain rating scale, medication(s)/side effects and non-pharmacologic comfort measures 06/09/2024 0446 by Chilton Couch, RN Outcome: Progressing 06/09/2024 0332 by Chilton Couch, RN Outcome: Progressing   Problem: Health Behavior/Discharge Planning: Goal: Ability to manage health-related needs will improve 06/09/2024 0446 by Chilton Couch, RN Outcome: Progressing 06/09/2024 0332 by Chilton Couch, RN Outcome: Progressing   Problem: Clinical Measurements: Goal: Ability to maintain clinical measurements within normal limits will improve 06/09/2024 0446 by Chilton Couch, RN Outcome: Progressing 06/09/2024 0332 by Chilton Couch, RN Outcome: Progressing Goal: Will remain free from infection 06/09/2024 0446 by Chilton Couch, RN Outcome: Progressing 06/09/2024 0332 by Chilton Couch, RN Outcome: Progressing Goal: Diagnostic test results will improve 06/09/2024 0446 by Chilton Couch, RN Outcome: Progressing 06/09/2024 0332 by Chilton Couch, RN Outcome: Progressing Goal: Respiratory complications will improve 06/09/2024 0446 by Chilton Couch, RN Outcome: Progressing 06/09/2024 0332 by Chilton Couch, RN Outcome: Progressing Goal: Cardiovascular complication will be avoided 06/09/2024 0446 by Chilton Couch, RN Outcome: Progressing 06/09/2024 0332 by Chilton Couch, RN Outcome: Progressing   Problem: Activity: Goal: Risk for activity intolerance will decrease 06/09/2024 0446 by Chilton Couch, RN Outcome: Progressing 06/09/2024 0332 by Chilton Couch, RN Outcome: Progressing   Problem: Nutrition: Goal: Adequate nutrition will be maintained 06/09/2024 0446 by Chilton Couch, RN Outcome: Progressing 06/09/2024 0332 by Chilton Couch, RN Outcome: Progressing   Problem: Coping: Goal: Level of anxiety will decrease 06/09/2024 0446 by Chilton Couch, RN Outcome: Progressing 06/09/2024 0332 by Chilton Couch, RN Outcome: Progressing   Problem: Elimination: Goal: Will not experience complications related to bowel motility 06/09/2024 0446 by Chilton Couch, RN Outcome: Progressing 06/09/2024 0332 by Chilton Couch, RN Outcome: Progressing Goal: Will not experience complications related to urinary retention 06/09/2024 0446 by Chilton Couch, RN Outcome: Progressing 06/09/2024 0332 by Chilton Couch, RN Outcome: Progressing   Problem: Pain Managment: Goal: General experience of comfort will improve and/or be controlled 06/09/2024 0446 by Chilton Couch, RN Outcome: Progressing 06/09/2024 0332 by Chilton Couch, RN Outcome: Progressing   Problem: Safety: Goal: Ability to remain free from injury will improve 06/09/2024 0446 by Chilton Couch, RN Outcome: Progressing 06/09/2024 0332 by Chilton Couch, RN Outcome: Progressing   Problem: Skin Integrity: Goal: Risk for impaired skin integrity will decrease 06/09/2024 0446 by Chilton Couch, RN Outcome: Progressing 06/09/2024 0332 by Chilton Couch, RN Outcome: Progressing   Problem: Urinary Elimination: Goal: Signs and symptoms of infection will decrease 06/09/2024 0446 by Chilton Couch, RN Outcome: Progressing 06/09/2024 0332 by Chilton Couch, RN Outcome: Progressing

## 2024-06-09 NOTE — Progress Notes (Signed)
 OT Cancellation Note  Patient Details Name: Stacy Valencia MRN: [id number] DOB: June 09, 1933   Cancelled Treatment:    Reason Eval/Treat Not Completed: (P) Patient declined, no reason specified, Pt states she has been bothered by therapy too often and doesn't want to do anything today. Spoke with Pt at length about importance of therapy, pleasantly refused.   Scherry Curtis 06/09/2024, 12:48 PM

## 2024-06-09 NOTE — Evaluation (Signed)
 Physical Therapy Evaluation Patient Details Name: Stacy Valencia MRN: [id number] DOB: 1933-10-17 Today's Date: 06/09/2024  History of Present Illness  Pt is an 89 y.o. female [facility] due to weakness with UTI. 06/08/24 transferred back to hospital due to worsening mentation and urine culture positive for ESBL Klebsiella needing more intensive antibiotics.   PMH - afib, UTI, falls, osteoporosis, PE, GERD, L hip IM nail 2021.  Clinical Impression  Patient presents with decreased mobility due to generalized weakness, decreased sitting balance, decreased activity tolerance, and decreased cognition.  She was previously ambulatory with spouse assist using rollator and lift chair at home.  Currently mod A up to EOB and not tolerating EOB despite VSS.  She was adamant about not getting up even to [facility].  She will benefit from skilled PT in the acute setting and from post-acute inpatient rehab (<3 hours/day) prior to return home.         If plan is discharge home, recommend the following: A lot of help with bathing/dressing/bathroom;Assist for transportation;Assistance with cooking/housework;Two people to help with walking and/or transfers   Can travel by private vehicle   No    Equipment Recommendations None recommended by PT  Recommendations for Other Services       Functional Status Assessment Patient has had a recent decline in their functional status and demonstrates the ability to make significant improvements in function in a reasonable and predictable amount of time.     Precautions / Restrictions Precautions Precautions: Fall Recall of Precautions/Restrictions: Impaired      Mobility  Bed Mobility Overal bed mobility: Needs Assistance Bed Mobility: Supine to Sit, Sit to Supine   Sidelying to sit: Mod assist   Sit to supine: Min assist   General bed mobility comments: lifting help for trunk to sit EOB and to scoot hips, to supine pt leaning back  and A for legs into bed    Transfers                   General transfer comment: Declined any further mobility than to EOB stating she just needs to rest to get better despite education with PT and spouse    Ambulation/Gait                  Stairs            Wheelchair Mobility     Tilt Bed    Modified Rankin (Stroke Patients Only)       Balance Overall balance assessment: Needs assistance   Sitting balance-Leahy Scale: Poor Sitting balance - Comments: mod A for EOB due to pt wanting to lie back down.  Second attempt slightly improved sitting for BP measurement                                     Pertinent Vitals/Pain Pain Assessment Pain Assessment: No/denies pain    Home Living Family/patient expects to be discharged to:: Private residence Living Arrangements: Spouse/significant other Available Help at Discharge: Family;Available 24 hours/day Type of Home: House Home Access: Ramped entrance       Home Layout: One level Home Equipment: Agricultural consultant (2 wheels);Wheelchair - manual;Shower seat;BSC/3in1;Grab bars - tub/shower;Hand held Sales executive;Toilet riser Additional Comments: Pt lives at home with husband, has lift chair for standing.    Prior Function Prior Level of Function : Needs assist;History of Falls (  last six months)             Mobility Comments: spouse able to assist at home until recently pt not tolerating mobility or allowing up to [facility]. ADLs Comments: husband helps with all ADLs, bathing, transfers     Extremity/Trunk Assessment   Upper Extremity Assessment Upper Extremity Assessment: Defer to OT evaluation    Lower Extremity Assessment Lower Extremity Assessment: RLE deficits/detail;LLE deficits/detail RLE Deficits / Details: AROM WFL, strength hip flexion 2/5, knee extension 4-/5, ankle DF 4/5 LLE Deficits / Details: AROM WFL, strength hip flexion 2+/5, knee extension 4-/5, ankle  DF 4/5    Cervical / Trunk Assessment Cervical / Trunk Assessment: Kyphotic;Other exceptions Cervical / Trunk Exceptions: trunk weakness  Communication   Communication Communication: Impaired Factors Affecting Communication: Hearing impaired    Cognition Arousal: Alert Behavior During Therapy: Agitated, WFL for tasks assessed/performed   PT - Cognitive impairments: Safety/Judgement, Problem solving, Orientation   Orientation impairments: Time, Situation                   PT - Cognition Comments: recalling events at home when she walked to the mailbox by herself then went back to bed though spouse says she had not been getting up at home past couple of days. Needs encouragement to participate and admits to being stubborn like Melodie Spry Following commands: Impaired Following commands impaired: Follows one step commands inconsistently, Follows one step commands with increased time     Cueing Cueing Techniques: Verbal cues     General Comments General comments (skin integrity, edema, etc.): BP supine 121/72, sitting EOB 117/77; assisted to change pt's gown due to soiled thought with urine, though pt states spilled her drink because she was shaky and noted no dampness on pad under her.  Spouse in the room and supportive.    Exercises     Assessment/Plan    PT Assessment Patient needs continued PT services  PT Problem List Decreased strength;Decreased cognition;Decreased activity tolerance;Decreased balance;Decreased mobility;Decreased safety awareness       PT Treatment Interventions DME instruction;Gait training;Functional mobility training;Therapeutic activities;Therapeutic exercise;Balance training;Neuromuscular re-education;Patient/family education;Cognitive remediation    PT Goals (Current goals can be found in the Care Plan section)  Acute Rehab PT Goals Patient Stated Goal: agrees to rehab PT Goal Formulation: With patient/family Time For Goal Achievement:  06/23/24 Potential to Achieve Goals: Fair    Frequency Min 2X/week     Co-evaluation               AM-PAC PT 6 Clicks Mobility  Outcome Measure Help needed turning from your back to your side while in a flat bed without using bedrails?: A Lot Help needed moving from lying on your back to sitting on the side of a flat bed without using bedrails?: A Lot Help needed moving to and from a bed to a chair (including a wheelchair)?: Total Help needed standing up from a chair using your arms (e.g., wheelchair or bedside chair)?: Total Help needed to walk in hospital room?: Total Help needed climbing 3-5 steps with a railing? : Total 6 Click Score: 8    End of Session   Activity Tolerance: Patient limited by fatigue Patient left: in bed;with call bell/phone within reach;with bed alarm set;with family/visitor present   PT Visit Diagnosis: Other symptoms and signs involving the nervous system (R29.898);Muscle weakness (generalized) (M62.81);Other abnormalities of gait and mobility (R26.89)    Time: 1350-1417 PT Time Calculation (min) (ACUTE ONLY): 27 min  Charges:   PT Evaluation $PT Eval Moderate Complexity: 1 Mod PT Treatments $Therapeutic Activity: 8-22 mins PT General Charges $$ ACUTE PT VISIT: 1 Visit         Abigail Hoff, PT Acute Rehabilitation Services Office:[phone] 06/09/2024   Stacy Valencia 06/09/2024, 5:07 PM

## 2024-06-09 NOTE — TOC Progression Note (Addendum)
 Transition of Care [facility]) - Progression Note    Patient Details  Name: Stacy Valencia MRN: [id number] Date of Birth: 12-Dec-1933  [facility]) CM/SW Contact  Juliane Och, LCSW Phone Number: 06/09/2024, 12:58 PM  Clinical Narrative:     12:58 PM Per RNCM, patient's spouse expressed interest in patient discharging to SNF. CSW followed up with patient who also agrees to discharging to SNF. CSW sent referrals to SNFs within [facility]. Per RNCM, patient's spouse is not interested in patient returning to [facility] and expressed interest in better rated SNFs, such as [facility] and [facility].   Expected Discharge Plan: Skilled Nursing Facility Barriers to Discharge: Continued Medical Work up, SNF Pending bed offer, English as a second language teacher  Expected Discharge Plan and Services In-house Referral: Clinical Social Work Discharge Planning Services: CM Consult Post Acute Care Choice: Skilled Nursing Facility Living arrangements for the past 2 months: Single Family Home                           HH Arranged: PT, OT HH Agency: [facility] Date [facility] Agency Contacted: 06/06/24 Time HH Agency Contacted: 1435 Representative spoke with at [facility] Agency: Randel Buss   Social Determinants of Health (SDOH) Interventions SDOH Screenings   Food Insecurity: No Food Insecurity (06/06/2024)  Housing: Low Risk  (06/06/2024)  Transportation Needs: No Transportation Needs (06/06/2024)  Utilities: Not At Risk (06/08/2024)  Social Connections: Moderately Integrated (06/06/2024)  Tobacco Use: Low Risk  (06/06/2024)    Readmission Risk Interventions    04/14/2024    2:39 PM  Readmission Risk Prevention Plan  Post Dischage Appt Complete  Medication Screening Complete  Transportation Screening Complete

## 2024-06-09 NOTE — NC FL2 (Signed)
 [place]  MEDICAID FL2 LEVEL OF CARE FORM     IDENTIFICATION  Patient Name: Stacy Valencia Birthdate: [birth year] Sex: female Admission Date (Current Location): 06/05/2024  [facility] [place] and [place] Number:  Producer, television/film/video and Address:  [facility] [place], [address] [place], [place] [address]      Provider Number: [id number]  Attending Physician Name and Address:  Unk Garb, DO  Relative Name and Phone Number:  Berlyn Saylor; Spouse; [phone]    Current Level of Care: Hospital Recommended Level of Care: Skilled Nursing Facility Prior Approval Number:    Date Approved/Denied:   PASRR Number: [id number]  Discharge Plan: SNF    Current Diagnoses: Patient Active Problem List   Diagnosis Date Noted   Protein-calorie malnutrition, severe 06/07/2024   Acquired hypothyroidism 06/07/2024   Weakness 06/06/2024   Pressure injury of skin 06/06/2024   History of pulmonary embolism - saddle PE in Jan 2014. on Xarelto  06/06/2024   Chronic anticoagulation - for history of saddle PE in 2014 06/06/2024   PAF (paroxysmal atrial fibrillation) (HCC) 11/19/2023   Transient hypotension 11/19/2023   Urinary tract infection due to ESBL Klebsiella 08/13/2020   Adjustment disorder with anxious mood    GERD (gastroesophageal reflux disease) 01/05/2013    Orientation RESPIRATION BLADDER Height & Weight     Self, Situation, Place, Time  Normal (Room Air) Incontinent Weight: 130 lb 15.3 oz (59.4 kg) Height:  5' 5 (165.1 cm)  BEHAVIORAL SYMPTOMS/MOOD NEUROLOGICAL BOWEL NUTRITION STATUS      Incontinent Diet (Please see discharge summary)  AMBULATORY STATUS COMMUNICATION OF NEEDS Skin   Limited Assist Verbally PU Stage and Appropriate Care, Other (Comment) (Pressure Injury Back Right;Upper Stage 1; Pressure Injury Back Left;Upper Stage 1; Pressure Injury Buttocks Right Deep Tissue; Pressure Injury Left Stage 2; Pressure Injury Heel Right Stage 1)                        Personal Care Assistance Level of Assistance  Bathing, Dressing, Feeding Bathing Assistance: Limited assistance Feeding assistance: Limited assistance Dressing Assistance: Limited assistance     Functional Limitations Info             SPECIAL CARE FACTORS FREQUENCY  PT (By licensed PT), OT (By licensed OT)     PT Frequency: 5x OT Frequency: 5x            Contractures Contractures Info: Not present    Additional Factors Info  Code Status, Allergies, Isolation Precautions Code Status Info: Full Code Allergies Info: Sulfa Antibiotics     Isolation Precautions Info: Contact Precautions (ESBL)     Current Medications (06/09/2024):  This is the current hospital active medication list Current Facility-Administered Medications  Medication Dose Route Frequency Provider Last Rate Last Admin   acetaminophen  (TYLENOL ) tablet 650 mg  650 mg Oral Q8H PRN Corrinne Din, MD       Or   acetaminophen  (TYLENOL ) suppository 650 mg  650 mg Rectal Q6H PRN Corrinne Din, MD       Gerhardt's butt cream   Topical BID Unk Garb, DO   Given at 06/09/24 0272   Gerhardt's butt cream   Topical PRN Corrinne Din, MD   1 Application at 06/08/24 1215   hydrOXYzine (ATARAX) tablet 25 mg  25 mg Oral TID PRN Newton, Steven J, MD       melatonin tablet 10 mg  10 mg Oral QHS Unk Garb, DO  meropenem (MERREM) 1 g in sodium chloride  0.9 % 100 mL IVPB  1 g Intravenous Q12H Unk Garb, DO 200 mL/hr at 06/09/24 0843 1 g at 06/09/24 3235   metoprolol  tartrate (LOPRESSOR ) tablet 12.5 mg  12.5 mg Oral BID Corrinne Din, MD   12.5 mg at 06/08/24 2219   midodrine  (PROAMATINE ) tablet 5 mg  5 mg Oral BID WC Corrinne Din, MD   5 mg at 06/09/24 0841   mirtazapine (REMERON) tablet 15 mg  15 mg Oral QHS Unk Garb, DO       multivitamin with minerals tablet 1 tablet  1 tablet Oral Daily Unk Garb, DO       nutrition supplement (JUVEN) (JUVEN) powder packet 1 packet  1 packet Oral BID BM  Unk Garb, DO       ondansetron  (ZOFRAN ) tablet 4 mg  4 mg Oral Q6H PRN Newton, Steven J, MD       pantoprazole  (PROTONIX ) EC tablet 40 mg  40 mg Oral Daily Unk Garb, DO       rivaroxaban  (XARELTO ) tablet 20 mg  20 mg Oral Daily Corrinne Din, MD   20 mg at 06/09/24 5732     Discharge Medications: Please see discharge summary for a list of discharge medications.  Relevant Imaging Results:  Relevant Lab Results:   Additional Information [id number]  Juliane Och, LCSW

## 2024-06-10 DIAGNOSIS — F4322 Adjustment disorder with anxiety: Secondary | ICD-10-CM | POA: Diagnosis not present

## 2024-06-10 DIAGNOSIS — N3000 Acute cystitis without hematuria: Secondary | ICD-10-CM | POA: Diagnosis not present

## 2024-06-10 DIAGNOSIS — K2100 Gastro-esophageal reflux disease with esophagitis, without bleeding: Secondary | ICD-10-CM

## 2024-06-10 DIAGNOSIS — R531 Weakness: Secondary | ICD-10-CM | POA: Diagnosis not present

## 2024-06-10 DIAGNOSIS — Z86711 Personal history of pulmonary embolism: Secondary | ICD-10-CM | POA: Diagnosis not present

## 2024-06-10 DIAGNOSIS — B9689 Other specified bacterial agents as the cause of diseases classified elsewhere: Secondary | ICD-10-CM | POA: Diagnosis not present

## 2024-06-10 DIAGNOSIS — I480 Paroxysmal atrial fibrillation: Secondary | ICD-10-CM | POA: Diagnosis not present

## 2024-06-10 DIAGNOSIS — R2689 Other abnormalities of gait and mobility: Secondary | ICD-10-CM | POA: Diagnosis not present

## 2024-06-10 DIAGNOSIS — F32A Depression, unspecified: Secondary | ICD-10-CM | POA: Diagnosis not present

## 2024-06-10 DIAGNOSIS — M6259 Muscle wasting and atrophy, not elsewhere classified, multiple sites: Secondary | ICD-10-CM | POA: Diagnosis not present

## 2024-06-10 DIAGNOSIS — K219 Gastro-esophageal reflux disease without esophagitis: Secondary | ICD-10-CM | POA: Diagnosis not present

## 2024-06-10 DIAGNOSIS — L89316 Pressure-induced deep tissue damage of right buttock: Secondary | ICD-10-CM | POA: Diagnosis not present

## 2024-06-10 DIAGNOSIS — E559 Vitamin D deficiency, unspecified: Secondary | ICD-10-CM | POA: Diagnosis not present

## 2024-06-10 DIAGNOSIS — F419 Anxiety disorder, unspecified: Secondary | ICD-10-CM | POA: Diagnosis not present

## 2024-06-10 DIAGNOSIS — R296 Repeated falls: Secondary | ICD-10-CM | POA: Diagnosis not present

## 2024-06-10 DIAGNOSIS — R41841 Cognitive communication deficit: Secondary | ICD-10-CM | POA: Diagnosis not present

## 2024-06-10 DIAGNOSIS — R0902 Hypoxemia: Secondary | ICD-10-CM | POA: Diagnosis not present

## 2024-06-10 DIAGNOSIS — Z9181 History of falling: Secondary | ICD-10-CM | POA: Diagnosis not present

## 2024-06-10 DIAGNOSIS — I2699 Other pulmonary embolism without acute cor pulmonale: Secondary | ICD-10-CM | POA: Diagnosis not present

## 2024-06-10 DIAGNOSIS — E039 Hypothyroidism, unspecified: Secondary | ICD-10-CM | POA: Diagnosis not present

## 2024-06-10 DIAGNOSIS — M549 Dorsalgia, unspecified: Secondary | ICD-10-CM | POA: Diagnosis not present

## 2024-06-10 DIAGNOSIS — I959 Hypotension, unspecified: Secondary | ICD-10-CM | POA: Diagnosis not present

## 2024-06-10 DIAGNOSIS — N390 Urinary tract infection, site not specified: Secondary | ICD-10-CM | POA: Diagnosis not present

## 2024-06-10 DIAGNOSIS — E43 Unspecified severe protein-calorie malnutrition: Secondary | ICD-10-CM | POA: Diagnosis not present

## 2024-06-10 DIAGNOSIS — M6281 Muscle weakness (generalized): Secondary | ICD-10-CM | POA: Diagnosis not present

## 2024-06-10 DIAGNOSIS — Z7401 Bed confinement status: Secondary | ICD-10-CM | POA: Diagnosis not present

## 2024-06-10 LAB — BASIC METABOLIC PANEL WITH GFR
Anion gap: 8 (ref 5–15)
BUN: 7 mg/dL — ABNORMAL LOW (ref 8–23)
CO2: 23 mmol/L (ref 22–32)
Calcium: 9.3 mg/dL (ref 8.9–10.3)
Chloride: 102 mmol/L (ref 98–111)
Creatinine, Ser: 0.58 mg/dL (ref 0.44–1.00)
GFR, Estimated: >60 mL/min (ref >60)
Glucose, Bld: 103 mg/dL — ABNORMAL HIGH (ref 70–99)
Potassium: 4.1 mmol/L (ref 3.5–5.1)
Sodium: 133 mmol/L — ABNORMAL LOW (ref 135–145)

## 2024-06-10 MED ORDER — THIAMINE MONONITRATE 100 MG PO TABS
100.0000 mg | ORAL_TABLET | Freq: Every day | ORAL | Status: DC
  Administered 2024-06-10: 100 mg via ORAL
  Filled 2024-06-10: qty 1

## 2024-06-10 MED ORDER — VITAMIN B-1 100 MG PO TABS: 100.0000 mg | ORAL_TABLET | Freq: Every day | ORAL | Status: DC

## 2024-06-10 MED ORDER — HYDROXYZINE HCL 25 MG PO TABS: 25.0000 mg | ORAL_TABLET | Freq: Three times a day (TID) | ORAL | Status: DC | PRN

## 2024-06-10 MED ORDER — OMEPRAZOLE MAGNESIUM 20 MG PO TBEC: 20.0000 mg | DELAYED_RELEASE_TABLET | Freq: Two times a day (BID) | ORAL | Status: DC

## 2024-06-10 MED ORDER — PANTOPRAZOLE SODIUM 40 MG PO TBEC
40.0000 mg | DELAYED_RELEASE_TABLET | Freq: Two times a day (BID) | ORAL | Status: DC
  Administered 2024-06-10: 40 mg via ORAL
  Filled 2024-06-10: qty 1

## 2024-06-10 MED ORDER — MELATONIN 10 MG PO TABS: 10.0000 mg | ORAL_TABLET | Freq: Every day | ORAL | Status: DC

## 2024-06-10 MED ORDER — CIPROFLOXACIN HCL 500 MG PO TABS: 500.0000 mg | ORAL_TABLET | Freq: Two times a day (BID) | ORAL | Status: AC

## 2024-06-10 MED ORDER — CALCIUM CARBONATE ANTACID 500 MG PO CHEW: 2.0000 | CHEWABLE_TABLET | Freq: Four times a day (QID) | ORAL | Status: AC | PRN

## 2024-06-10 MED ORDER — MIRTAZAPINE 15 MG PO TABS: 15.0000 mg | ORAL_TABLET | Freq: Every day | ORAL | Status: DC

## 2024-06-10 MED ORDER — GERHARDT'S BUTT CREAM: 1.0000 | TOPICAL_CREAM | Freq: Two times a day (BID) | CUTANEOUS | Status: DC

## 2024-06-10 MED ORDER — ONDANSETRON HCL 4 MG PO TABS: 4.0000 mg | ORAL_TABLET | Freq: Four times a day (QID) | ORAL | Status: DC | PRN

## 2024-06-10 NOTE — Progress Notes (Signed)
 Nutrition Follow-up  DOCUMENTATION CODES:   Severe malnutrition in context of social or environmental circumstances  INTERVENTION:   Premier Protein BID (husband to bring from home), each supplement provides 160 kcal and 30 gm protein. Multivitamin with minerals daily. Continue to offer Juven BID, each packet provides 80 calories, 8 grams of carbohydrate, 2.5  grams of protein (collagen), 7 grams of L-arginine and 7 grams of L-glutamine; supplement contains CaHMB, Vitamins C, E, B12 and Zinc to promote wound healing. Encourage intake of meals and supplements.   NUTRITION DIAGNOSIS:   Severe Malnutrition related to social / environmental circumstances as evidenced by energy intake < or equal to 75% for > or equal to 1 month, severe muscle depletion, severe fat depletion, percent weight loss (17% weight loss within 6 months).  Ongoing   GOAL:   Patient will meet greater than or equal to 90% of their needs  Progressing   MONITOR:   PO intake, Supplement acceptance, Skin  REASON FOR ASSESSMENT:   Consult Assessment of nutrition requirement/status  ASSESSMENT:   89 yo female admitted with weakness, fall, UTI. PMH includes GERD, pulmonary embolism, A fib, hypotension, chronic lower back compression fractures, choledocholithiasis S/P ERCP with biliary sphincterotomy and sphincteroplasty in April 2025.  Patient reports vomiting this morning d/t reflux. Protonix  increased to 40 mg BID today. She has not been eating very well. She does not like Ensure, and husband has not brought her any Premier Protein from home yet. Juven was added BID, but patient is refusing it most of the time.   Currently on a regular diet. Meal intakes: 50-75%  Labs reviewed. Na 133 Medications reviewed and include Remeron, MVI with minerals, juven, protonix , thiamine.  Admit weight 56.3 kg (6/17) Current weight 59.4 kg (6/19)  Patient meets criteria for severe malnutrition, given severe depletion of  muscle and subcutaneous fat mass, energy intake < or equal to 75% for > or equal to 1 month, and 17% weight loss within 6 months.  NUTRITION - FOCUSED PHYSICAL EXAM:  Flowsheet Row Most Recent Value  Orbital Region Severe depletion  Upper Arm Region Moderate depletion  Thoracic and Lumbar Region Severe depletion  Buccal Region Severe depletion  Temple Region Severe depletion  Clavicle Bone Region Severe depletion  Clavicle and Acromion Bone Region Severe depletion  Scapular Bone Region Severe depletion  Dorsal Hand Severe depletion  Patellar Region Moderate depletion  Anterior Thigh Region Moderate depletion  Posterior Calf Region Moderate depletion  Edema (RD Assessment) None  Hair Reviewed  Eyes Reviewed  Mouth Reviewed  Skin Reviewed  Nails Reviewed    Diet Order:   Diet Order             Diet regular Room service appropriate? Yes; Fluid consistency: Thin  Diet effective now                   EDUCATION NEEDS:   Education needs have been addressed  Skin:  Skin Assessment: Skin Integrity Issues: Skin Integrity Issues:: DTI, Stage I, Stage II DTI: R buttocks Stage I: L scapula, R back; R heel  Last BM:  6/19 type 3  Height:   Ht Readings from Last 1 Encounters:  06/07/24 5' 5 (1.651 m)    Weight:   Wt Readings from Last 1 Encounters:  06/09/24 59.4 kg    Ideal Body Weight:  56.8 kg  BMI:  Body mass index is 21.79 kg/m.  Estimated Nutritional Needs:   Kcal:  1400-1600  Protein:  75-85  gm  Fluid:  1.4-1.6 L   Barnet Boots RD, LDN, CNSC Contact via secure chat. If unavailable, use group chat RD Inpatient.

## 2024-06-10 NOTE — Discharge Summary (Signed)
 [facility] Discharge Summary   Patient name: Stacy Valencia  Admit date:     06/05/2024  Discharge date: 06/10/2024  Attending Physician: Corrinne Din [[id number]]  Discharge Physician: Unk Garb   PCP: Renetta Carter, MD (Inactive)  Admitted From: Home  Disposition:  [facility]  SNF  Recommendations for Outpatient Follow-up:  Follow up with PCP in 1-2 weeks  Home Health:No Equipment/Devices: None    Discharge Condition:Stable CODE STATUS:FULL Diet recommendation: Regular Fluid Restriction: None  Hospital Summary: [name] with medical history significant of atrial fibrillation, hypotension on midodrine , PE DVT on anticoagulation, GERD presenting with weakness, fall, UTI.  Patient reports progressive weakness at home over the past 1 to 2 days.  Was using Tylenol  patient fell at toilet.  No reported head trauma or LOC.  Patient reports mild generalized weakness.  Also with decreased p.o. intake.  No fevers or chills.  Mild nausea.  No belly pain or diarrhea.  Decreased p.o. intake over multiple days.  Noted to have been admitted April 20 through April 24 for issues including choledocholithiasis with concern for early cholecystitis.  Had ERCP with biliary sphincterotomy and sphincteroplasty.  Patient declined cholecystectomy.  No reported right upper quadrant pain. Presented to the ER afebrile, hemodynamically stable.  Satting well on room air.  White count 15.3, hemoglobin 15, platelets 232, creatinine 0.9, AST, ALT as well as T. bili within normal limits.  Magnesium  1.4.  CT head within normal limits.  CT angio chest negative for PE but though showing?  Right lower lobe atelectasis versus early pneumonia.  CT abdomen pelvis with increased urinary bladder wall thickening  Significant Events: Admitted 06/05/2024 for UTI to [facility] program 06-08-2024 had to be transferred back to hospital due to worsening mentation and urine cx  growing ESBL Klebsiella requiring more intensive IV ABX that can be provided at [facility] program.  Admission Labs: WBC 15.3, HgB 15, Plt 232 Na 135, K 3.7, CO2 of 21, BUN 9, Scr 0.89, glu 115 UA cloudy, spg 1.009, nitrite negative, LE Large, WBC >50  Admission Imaging Studies: CT head No acute intracranial abnormality. 2. Stable non contrast CT appearance of chronic white matter disease, and chronic left mastoid air cell opacification CTPA Negative for acute pulmonary embolus. Mild respiratory motion at the right lung base. 2. Generalized increased pulmonary subpleural scarring since a 2014 CTA. Mild left lung atelectasis. More confluent right lower lobe opacity is indeterminate for atelectasis versus developing infection. No consolidation or pleural effusion. 3. CT Abdomen and Pelvis today reported separately. 4. Calcified coronary artery, Aortic Atherosclerosis CT abd/pelvis Mild but increased urinary bladder wall thickening since last year. UTI/Cystitis not excluded. 2. CTA Chest reported separately today but lung bases on these images and compared to November CT Abdomen and Pelvis appear more stable, arguing in favor of atelectasis and scarring rather than acute lung base infection. 3. No other acute or inflammatory process identified in the abdomen or pelvis. Sequelae of ERCP in April. 4. Chronic lower thoracic and lumbar compression fractures. Aortic Atherosclerosis   Significant Labs: Urine culture growing >=100,000 COLONIES/mL KLEBSIELLA PNEUMONIAE  Urine cx shows ESBL Klebsiella  Significant Imaging Studies:   Antibiotic Therapy: Anti-infectives (From admission, onward)    Start     Dose/Rate Route Frequency Ordered Stop   06/10/24 0800  ciprofloxacin  (CIPRO ) tablet 500 mg        500 mg Oral 2 times daily 06/09/24 1318 06/13/24 0759  06/08/24 1530  meropenem (MERREM) 1 g in sodium chloride  0.9 % 100 mL IVPB  Status:  Discontinued        1 g 200 mL/hr over 30 Minutes Intravenous STAT  06/08/24 1439 06/08/24 1443   06/08/24 1530  meropenem (MERREM) 1 g in sodium chloride  0.9 % 100 mL IVPB        1 g 200 mL/hr over 30 Minutes Intravenous Every 12 hours 06/08/24 1443 06/09/24 2329   06/07/24 1000  cefTRIAXone  (ROCEPHIN ) 1 g in sodium chloride  0.9 % 100 mL IVPB  Status:  Discontinued        1 g 200 mL/hr over 30 Minutes Intravenous Daily 06/06/24 1637 06/08/24 1243   06/06/24 0600  cefTRIAXone  (ROCEPHIN ) 1 g in sodium chloride  0.9 % 100 mL IVPB  Status:  Discontinued        1 g 200 mL/hr over 30 Minutes Intravenous Daily 06/06/24 0533 06/06/24 1637       Procedures:   Consultants:    Hospital Course by Problem: * Urinary tract infection due to ESBL Klebsiella 06-06-2024 Worsening weakness at home w/ uptrending WBC count and noted cystitis on CT imaging Will place on course of IV rocephin  for infectious coverage pending urine culture and decreased po intake  Transition to oral antibiotics pending urine culture and improvement in po intake   06-07-2024 continue with IV Rocephin . Day #2 of 3. Urine cx growing KLEBSIELLA PNEUMONIAE. Should have final MIC by tomorrow.  06-08-2024 urine cx growing ESBL Klebsiella. Pt transferred back to [facility] hospital for more intensive IV ABX. Change to IV meropenem.  06-09-2024 pt back on in-house hospital unit. On IV meropenem Day #2. Will ask ID pharmacist about duration of therapy.  06-10-2024 Discussed with ID pharmacist. ESBL Klebsiella sensitive to cipro . While Klebsiella was resistant on MIC to rocephin , pt's leukocytosis did improve on IV rocephin . Pt has completed 2 days of IV meropenem. Will complete course with 3 days of po cipro .  Pressure injury of skin 06-06-2024 Present on admission. Wound type: 1.  Deep Tissue Pressure Injury sacrum/buttocks with purple maroon discoloration  2.  R scapula deep tissue pressure injury purple maroon discoloration  3.  L scapula Stage 1Pressure Injury  4.  B heels Stage 1 pressure injuries   Pressure Injury POA: Yes Measurement: see nursing flowsheet  Wound bed: as above  Drainage (amount, consistency, odor) see nursing flowsheet  Periwound: appears to have chronic tissue damage to buttocks, per notes spends most of time sitting in recliner Dressing procedure/placement/frequency:  Cleanse buttocks/sacrum with soap and water, dry and apply Xeroform gauze (Lawson [id number]) to areas of purple maroon discoloration daily and secure with silicone foam or ABD pad whichever is preferred.  Apply silicone foam to B scapula and B heels, lift to assess daily.    06-07-2024 add Gerhardt butt cream. Continue local wound care. Offload onto her side and back as much as possible. Outpatient referral made to outpatient wound care. Paramedic to take pictures again tomorrow.  06-08-2024 continue Gerhard butt cream. Wounds appear improved on pictures today.  06-09-2024 continue Gerhardt's butt cream. Wound looking better  06-10-2024 continue wound care.  April 21-2025     June-16-2025     June-17-2025     June 08, 2024     June 09, 2024       Weakness 06-06-2024 due to UTI and dehydration.  Progressive weakness and fall at home in setting of dehydration, UTI  Clinically dry on exam with worsened  skin turgor and dry oral mucosa  No reported head trauma or LOC  Nonfocal neuro exam  Imaging negative for any acute fractures  PT evaluated with recommendation for HHPT  Will plan to coordinate with [facility] and PT  IVF hydration  Treat UTI  Monitor   06-07-2024 continue with ambulation at home with assistance of husband. Renewed orders for Home Health PT/OT. Will add home health RN and nurse aide.  06-08-2024 pt refusing to get out of bed while at [facility] program. Will get PT eval. Will likely need SNF placement.  06-09-2024 transferred back to in-house hospital unit due to weakness/ESBL Kleb UTI. Will need PT assessment. Husband stated yesterday he could not care for patient at home  if she is not walking.  06-10-2024 needs SNF placement for rehab.   Acquired hypothyroidism 06-07-2024. TSH elevated at 9.76. starting synthroid 50 mcg daily. Will need TSH and FT4 repeated in 6 weeks by PCP.  06-08-2024 TSH elevated at 9.76 but Free T4 is elevated at 1.37. may be sick euthyroid syndrome. Will stop synthroid for now. Will need repeat TSH and FT4 in 6 weeks when she is not ill.  Leukocytosis-resolved as of 06/09/2024 06-06-2024 White count 15 on presentation Suspect likely multifactorial contributions of active UTI as well as clinical dehydration as all cell lines appear elevated CT imaging w/ ? PNA vs. Atelectasis  No hypoxia at present  Check PCT to correlate  Urine cultures obtained in the ER Will monitor white count with treatment and hydration Follow  06-07-2024 awaiting labs for today. Hopeful that abx therapy will decrease/resolve leukocytosis.  If not decreasing, could be her decubitus wound on her buttock as another potential source.  06-08-2024 repeat labs today due to transfer back to hospital.  06-09-2024 resolved. WBC 8.5 today.  Protein-calorie malnutrition, severe Nutrition Status: Nutrition Problem: Severe Malnutrition Etiology: social / environmental circumstances Signs/Symptoms: energy intake < or equal to 75% for > or equal to 1 month, percent weight loss (17% weight loss within 6 months) Percent weight loss: 17 % Interventions: Premier Protein, MVI, Education  Chronic anticoagulation - for history of saddle PE in 2014 On Xarelto   History of pulmonary embolism - saddle PE in Jan 2014. on Xarelto  On Xarelto   Transient hypotension 06-06-2024 Systolic BPs in low 100s  Mild exacerbation in setting of dehydration  Cont home midodrine   Monitor   06-07-2024 midodrine  had been started during her April 2025 hospitalization in order for her lopressor  to be uptitrated to control her HR from her rapid afib.  Will continue midodrine   BID.  06-08-2024 stable  06-09-2024 stable.  06-10-2024 stable. Continue 5 mg bid midodrine .  PAF (paroxysmal atrial fibrillation) (HCC) 06-06-2024 Rate controlled at present  Cont home metoprolol  with hold parameters  Cont xarelto   Monitor   06-07-2024 stable. On Xarelto  and lopressor .  06-08-2024 stable  06-09-2024 stable. On xarelto  and lopressor   06-10-2024 stable.  Adjustment disorder with anxious mood 06-06-2024 Minimal to mild agitation on presentation in setting of UTI and dehydration  CT head WNL  Monitor for now   06-07-2024 stable  06-08-2024 add remeron at bedtime for her agitation and also to help her appetite. Remeron 15 mg at bedtime.  06-09-2024 continue with remeron at bedtime.  06-10-2024 continue remeron  GERD (gastroesophageal reflux disease) Cont protonix  bid.    Discharge Diagnoses:  Principal Problem:   Urinary tract infection due to ESBL Klebsiella Active Problems:   Weakness   Pressure injury of skin  Acquired hypothyroidism   GERD (gastroesophageal reflux disease)   Adjustment disorder with anxious mood   PAF (paroxysmal atrial fibrillation) (HCC)   Transient hypotension   History of pulmonary embolism - saddle PE in Jan 2014. on Xarelto    Chronic anticoagulation - for history of saddle PE in 2014   Protein-calorie malnutrition, severe   Discharge Instructions  Discharge Instructions     Ambulatory referral to Wound Clinic   Complete by: As directed    Buttocks wounds   Call MD for:  difficulty breathing, headache or visual disturbances   Complete by: As directed    Call MD for:  extreme fatigue   Complete by: As directed    Call MD for:  hives   Complete by: As directed    Call MD for:  persistant dizziness or light-headedness   Complete by: As directed    Call MD for:  persistant nausea and vomiting   Complete by: As directed    Call MD for:  redness, tenderness, or signs of infection (pain, swelling, redness, odor  or green/yellow discharge around incision site)   Complete by: As directed    Call MD for:  severe uncontrolled pain   Complete by: As directed    Call MD for:  temperature >100.4   Complete by: As directed    Diet general   Complete by: As directed    Discharge wound care:   Complete by: As directed    Wound care  Daily      Comments: 1.         Cleanse buttocks/sacrum with soap and water, dry and apply Xeroform gauze Timm Foot [phone]) to areas of purple maroon discoloration daily and secure with silicone foam or ABD pad whichever is preferred.  2.Apply silicone foam to B scapula and B heels, lift to assess daily. Change silicone foam q3 days and prn soiling.   Increase activity slowly   Complete by: As directed       Allergies as of 06/10/2024       Reactions   Sulfa Antibiotics Nausea And Vomiting   Reaction only occurs with tablet formulations        Medication List     TAKE these medications    acetaminophen  500 MG tablet Commonly known as: TYLENOL  Take 500 mg by mouth every 6 (six) hours as needed for mild pain (pain score 1-3).   calcium  carbonate 500 MG chewable tablet Commonly known as: TUMS - dosed in mg elemental calcium  Chew 2 tablets (400 mg of elemental calcium  total) by mouth 4 (four) times daily as needed for indigestion or heartburn.   ciprofloxacin  500 MG tablet Commonly known as: CIPRO  Take 1 tablet (500 mg total) by mouth 2 (two) times daily for 3 days.   Gerhardt's butt cream Crea Apply 1 Application topically 2 (two) times daily. To buttocks   hydrOXYzine 25 MG tablet Commonly known as: ATARAX Take 1 tablet (25 mg total) by mouth 3 (three) times daily as needed for anxiety.   Melatonin 10 MG Tabs Take 10 mg by mouth at bedtime.   metoprolol  tartrate 25 MG tablet Commonly known as: LOPRESSOR  Take 0.5 tablets (12.5 mg total) by mouth 2 (two) times daily. What changed:  when to take this additional instructions   midodrine  5 MG  tablet Commonly known as: PROAMATINE  Take 1 tablet (5 mg total) by mouth in the morning and at bedtime. Take 5 mg at 8AM and 5mg  at [facility] What changed:  when  to take this additional instructions   mirtazapine 15 MG tablet Commonly known as: REMERON Take 1 tablet (15 mg total) by mouth at bedtime.   omeprazole  20 MG tablet Commonly known as: PRILOSEC OTC Take 1 tablet (20 mg total) by mouth in the morning and at bedtime. What changed: when to take this   ondansetron  4 MG tablet Commonly known as: ZOFRAN  Take 1 tablet (4 mg total) by mouth every 6 (six) hours as needed for nausea.   thiamine 100 MG tablet Commonly known as: Vitamin B-1 Take 1 tablet (100 mg total) by mouth daily. Start taking on: June 11, 2024   Vitamin D3 1000 units Caps Take 1,000 Units by mouth daily.   Xarelto  20 MG Tabs tablet Generic drug: rivaroxaban  TAKE 1 TABLET BY MOUTH EVERY DAY What changed:  how much to take when to take this additional instructions               Durable Medical Equipment  (From admission, onward)           Start     Ordered   06/07/24 0741  For home use only DME wheelchair cushion (seat and back)  Once        06/07/24 0740              Discharge Care Instructions  (From admission, onward)           Start     Ordered   06/10/24 0000  Discharge wound care:       Comments: Wound care  Daily      Comments: 1.         Cleanse buttocks/sacrum with soap and water, dry and apply Xeroform gauze Timm Foot [phone]) to areas of purple maroon discoloration daily and secure with silicone foam or ABD pad whichever is preferred.  2.Apply silicone foam to B scapula and B heels, lift to assess daily. Change silicone foam q3 days and prn soiling.   06/10/24 1329            Allergies  Allergen Reactions   Sulfa Antibiotics Nausea And Vomiting    Reaction only occurs with tablet formulations    Discharge Exam: Vitals:   06/10/24 0829 06/10/24 0830  BP: 105/71  105/71  Pulse: 94 94  Resp: 16 16  Temp: 97.6 F (36.4 C) 97.6 F (36.4 C)  SpO2: 96% 96%    Physical Exam Vitals and nursing note reviewed.  Constitutional:      General: She is not in acute distress.    Appearance: She is not toxic-appearing or diaphoretic.     Comments: Chronically ill appearing  HENT:     Head: Normocephalic and atraumatic.   Cardiovascular:     Rate and Rhythm: Normal rate and regular rhythm.  Pulmonary:     Effort: Pulmonary effort is normal.     Breath sounds: Normal breath sounds.  Abdominal:     General: Abdomen is flat. There is no distension.     Palpations: Abdomen is soft.     Tenderness: There is no abdominal tenderness.   Skin:    General: Skin is warm and dry.     Capillary Refill: Capillary refill takes less than 2 seconds.   Neurological:     Mental Status: She is alert and oriented to person, place, and time.     The results of significant diagnostics from this hospitalization (including imaging, microbiology, ancillary and laboratory) are listed below for reference.  Microbiology: Recent Results (from the past 240 hours)  Urine Culture     Status: Abnormal   Collection Time: 06/06/24 12:37 AM   Specimen: Urine, Random  Result Value Ref Range Status   Specimen Description URINE, RANDOM  Final   Special Requests   Final    NONE Reflexed from [id number] Performed at [facility], [address], [place], [place] [address]    Culture (A)  Final    >=100,000 COLONIES/mL KLEBSIELLA PNEUMONIAE Confirmed Extended Spectrum Beta-Lactamase Producer (ESBL).  In bloodstream infections from ESBL organisms, carbapenems are preferred over piperacillin/tazobactam. They are shown to have a lower risk of mortality.    Report Status 06/08/2024 FINAL  Final   Organism ID, Bacteria KLEBSIELLA PNEUMONIAE (A)  Final      Susceptibility   Klebsiella pneumoniae - MIC*    AMPICILLIN >=32 RESISTANT Resistant     CEFAZOLIN  >=64 RESISTANT  Resistant     CEFEPIME >=32 RESISTANT Resistant     CEFTRIAXONE  >=64 RESISTANT Resistant     CIPROFLOXACIN  <=0.25 SENSITIVE Sensitive     GENTAMICIN <=1 SENSITIVE Sensitive     IMIPENEM 0.5 SENSITIVE Sensitive     NITROFURANTOIN  64 INTERMEDIATE Intermediate     TRIMETH/SULFA >=320 RESISTANT Resistant     AMPICILLIN/SULBACTAM >=32 RESISTANT Resistant     PIP/TAZO <=4 SENSITIVE Sensitive ug/mL    * >=100,000 COLONIES/mL KLEBSIELLA PNEUMONIAE     Labs: BNP (last 3 results) Recent Labs    11/19/23 0553  BNP 437.2*   Basic Metabolic Panel: Recent Labs  Lab 06/06/24 0053 06/06/24 0251 06/07/24 1110 06/09/24 0331 06/10/24 0406  NA 135  --  137 135 133*  K 3.7  --  4.1 4.0 4.1  CL 105  --  108 105 102  CO2 21*  --  22 23 23   GLUCOSE 115*  --  94 89 103*  BUN 9  --  9 7* 7*  CREATININE 0.89  --  0.75 0.83 0.58  CALCIUM  8.3*  --  8.8* 8.8* 9.3  MG  --  1.4* 1.9 1.9  --    Liver Function Tests: Recent Labs  Lab 06/06/24 0053 06/07/24 1110  AST 35 48*  ALT 10 11  ALKPHOS 75 61  BILITOT 1.1 1.0  PROT 5.7* 5.5*  ALBUMIN 2.7* 2.6*   CBC: Recent Labs  Lab 06/06/24 0053 06/07/24 1110 06/09/24 0331  WBC 15.3* 7.0 8.5  NEUTROABS 10.9*  --  3.8  HGB 15.0 13.5 13.9  HCT 44.6 40.0 41.2  MCV 104.2* 102.6* 102.5*  PLT 232 221 216   Urinalysis    Component Value Date/Time   COLORURINE YELLOW 06/06/2024 0037   APPEARANCEUR CLOUDY (A) 06/06/2024 0037   LABSPEC 1.009 06/06/2024 0037   PHURINE 6.0 06/06/2024 0037   GLUCOSEU NEGATIVE 06/06/2024 0037   HGBUR SMALL (A) 06/06/2024 0037   BILIRUBINUR NEGATIVE 06/06/2024 0037   KETONESUR NEGATIVE 06/06/2024 0037   PROTEINUR 30 (A) 06/06/2024 0037   NITRITE NEGATIVE 06/06/2024 0037   LEUKOCYTESUR LARGE (A) 06/06/2024 0037   Sepsis Labs Recent Labs  Lab 06/06/24 0053 06/06/24 0251 06/07/24 1110 06/09/24 0331  PROCALCITON  --  <0.10  --   --   WBC 15.3*  --  7.0 8.5    Procedures/Studies: CT ABDOMEN PELVIS W  CONTRAST Result Date: 06/06/2024 CLINICAL DATA:  89 year old female with altered mental status. Generalized weakness. Acute pain. History of ERCP and choledocholithiasis stone removal in April. EXAM: CT ABDOMEN AND PELVIS WITH CONTRAST  TECHNIQUE: Multidetector CT imaging of the abdomen and pelvis was performed using the standard protocol following bolus administration of intravenous contrast. RADIATION DOSE REDUCTION: This exam was performed according to the departmental dose-optimization program which includes automated exposure control, adjustment of the mA and/or kV according to patient size and/or use of iterative reconstruction technique. CONTRAST:  75mL OMNIPAQUE  IOHEXOL  350 MG/ML SOLN COMPARISON:  CTA chest today. CT Abdomen and Pelvis 11/19/2023. Images from ERCP 04/13/2024. FINDINGS: Lower chest: CTA chest today is reported separately. On these images and compared to the November CT Abdomen and Pelvis, lung base ventilation is more stable. Patchy bilateral lower lobe opacity more resembles atelectasis. Punctate right lower lobe calcified granuloma again noted. Hepatobiliary: Small volume pneumobilia, within normal limits status post ERCP in April. Contracted gallbladder also with a small volume of gas in the lumen (series 8 image 19). No convincing pericholecystic inflammation. No biliary ductal dilatation. Liver enhancement within normal limits. Pancreas: Atrophied. Spleen: Negative. Adrenals/Urinary Tract: Normal adrenal glands. Kidneys are stable and nonobstructed, with symmetric and normal contrast excretion on the delayed excretory images. Diminutive ureters appear normal to the bladder. Occasional pelvic phleboliths. Mild nonspecific bladder wall thickening is increased compared to last year. And mild urinary wall inflammatory stranding is possible on series 8, image 72. Stomach/Bowel: Nondilated large and small bowel loops. Diverticulosis of the descending and proximal sigmoid colon. No associated  active inflammation. Mild large bowel retained stool throughout. Diminutive or absent appendix, no large bowel inflammation identified. Terminal ileum within normal limits. Small gastric hiatal hernia is stable. Negative stomach and duodenum otherwise. No pneumoperitoneum, free fluid, or mesenteric inflammation identified. Vascular/Lymphatic: Aortoiliac calcified atherosclerosis. Normal caliber abdominal aorta, mild tortuosity. Major arterial structures in the abdomen and pelvis remain patent despite widespread atherosclerosis. Portal venous system appears to be patent. No lymphadenopathy identified. Reproductive: Diminutive or absent. Other: No pelvis free fluid. Musculoskeletal: Stable chronic T12, L2, L3 compression fractures since last year. Lumbar vertebrae again demonstrate vertebra plana and retropulsion. Mild chronic L4 inferior endplate deformity also is stable. Chronic left femur ORIF partially visible, appears stable. No acute osseous abnormality identified. IMPRESSION: 1. Mild but increased urinary bladder wall thickening since last year. UTI/Cystitis not excluded. 2. CTA Chest reported separately today but lung bases on these images and compared to November CT Abdomen and Pelvis appear more stable, arguing in favor of atelectasis and scarring rather than acute lung base infection. 3. No other acute or inflammatory process identified in the abdomen or pelvis. Sequelae of ERCP in April. 4. Chronic lower thoracic and lumbar compression fractures. Aortic Atherosclerosis (ICD10-I70.0). Electronically Signed   By: Marlise Simpers M.D.   On: 06/06/2024 04:27   CT Angio Chest PE W/Cm &/Or Wo Cm Result Date: 06/06/2024 CLINICAL DATA:  89 year old female with altered mental status. Generalized weakness. Acute pain. History of ERCP and choledocholithiasis stone removal in April. EXAM: CT ANGIOGRAPHY CHEST WITH CONTRAST TECHNIQUE: Multidetector CT imaging of the chest was performed using the standard protocol during  bolus administration of intravenous contrast. Multiplanar CT image reconstructions and MIPs were obtained to evaluate the vascular anatomy. RADIATION DOSE REDUCTION: This exam was performed according to the departmental dose-optimization program which includes automated exposure control, adjustment of the mA and/or kV according to patient size and/or use of iterative reconstruction technique. CONTRAST:  75mL OMNIPAQUE  IOHEXOL  350 MG/ML SOLN COMPARISON:  CTA chest 01/05/2013. CT Abdomen and Pelvis today reported separately. FINDINGS: Cardiovascular: Excellent contrast bolus timing in the pulmonary arterial tree. No central or left lung  pulmonary artery filling defect. Mild respiratory motion, most pronounced at the right lung base where distal pulmonary artery branch detail is obscured. No right lung pulmonary artery filling defect identified. Calcified coronary artery and aortic atherosclerosis. Heart size remains within normal limits. Tortuous thoracic aorta with little contrast in the aorta on this exam. No pericardial effusion. Mediastinum/Nodes: Small mediastinal lymph nodes are stable since 2014, negative. Lungs/Pleura: Major airways are patent. Slightly lower lung volumes compared to 2014. Since that time progressed symmetric bilateral pulmonary subpleural reticular opacity, scarring. Minor dependent atelectasis superimposed in the left lung. Increased elevation of the right hemidiaphragm since 2014 and right lower lobe more confluent, nonspecific peribronchial and confluent lung opacity. Punctate calcified granuloma in the right lower lobe. No pleural effusion. No consolidation at this time. Upper Abdomen: CT Abdomen and Pelvis today reported separately. Musculoskeletal: More exaggerated thoracic kyphosis compared to 2014. Generalized osteopenia. Mild T2 inferior endplate compression fracture is chronic and stable. No acute or suspicious osseous lesion identified. Review of the MIP images confirms the above  findings. IMPRESSION: 1. Negative for acute pulmonary embolus. Mild respiratory motion at the right lung base. 2. Generalized increased pulmonary subpleural scarring since a 2014 CTA. Mild left lung atelectasis. More confluent right lower lobe opacity is indeterminate for atelectasis versus developing infection. No consolidation or pleural effusion. 3. CT Abdomen and Pelvis today reported separately. 4. Calcified coronary artery, Aortic Atherosclerosis (ICD10-I70.0). Electronically Signed   By: Marlise Simpers M.D.   On: 06/06/2024 04:19   CT Head Wo Contrast Result Date: 06/06/2024 CLINICAL DATA:  89 year old female with altered mental status. Generalized weakness. EXAM: CT HEAD WITHOUT CONTRAST TECHNIQUE: Contiguous axial images were obtained from the base of the skull through the vertex without intravenous contrast. RADIATION DOSE REDUCTION: This exam was performed according to the departmental dose-optimization program which includes automated exposure control, adjustment of the mA and/or kV according to patient size and/or use of iterative reconstruction technique. COMPARISON:  Head CT 11/19/2023. FINDINGS: Brain: Stable cerebral volume, normal for age. No midline shift, ventriculomegaly, mass effect, evidence of mass lesion, intracranial hemorrhage or evidence of cortically based acute infarction. Patchy and confluent bilateral cerebral white matter hypodensity is stable. No cortical encephalomalacia identified. Vascular: Calcified atherosclerosis at the skull base. No suspicious intracranial vascular hyperdensity. Skull: Intact and stable.  No acute osseous abnormality identified. Sinuses/Orbits: Chronic left mastoid air cell opacification is stable since last year. Other Visualized paranasal sinuses and mastoids are stable and well aerated. Other: No acute orbit or scalp soft tissue finding identified. IMPRESSION: 1. No acute intracranial abnormality. 2. Stable non contrast CT appearance of chronic white matter  disease, and chronic left mastoid air cell opacification. Electronically Signed   By: Marlise Simpers M.D.   On: 06/06/2024 04:13    Time coordinating discharge: 55 mins  SIGNED:  Unk Garb, DO [facility] 06/10/24, 1:40 PM

## 2024-06-10 NOTE — TOC Transition Note (Signed)
 Transition of Care [facility]) - Discharge Note   Patient Details  Name: Stacy Valencia MRN: [id number] Date of Birth: 1933-07-14  Transition of Care [facility]) CM/SW Contact:  Juliane Och, LCSW Phone Number: 06/10/2024, 2:56 PM   Clinical Narrative:     Patient will DC to: [facility] SNF Anticipated DC date: 06/10/2024 Family notified: Chaniya Genter; Spouse; [phone] Transport by: Lyna Sandhoff  CSW introduced self and role to patient's spouse, D[place] End. CSW Administrator, sports ([email or web address]) patient's SNF options with their Medicare ratings [facility], [place], [address], [place], [place], [place]). [place] accepted bed offer at [facility]. CSW informed [facility] SNF who confirmed bed availability for patient discharge today. CSW informed patient of spouse's SNF decision and disposition plan.  Per MD patient ready for DC to [facility]. RN to call report prior to discharge [phone]). RN, patient, patient's family, and facility notified of DC. Discharge Summary and FL2 sent to facility. DC packet on chart. Ambulance transport requested for patient at 14:52.  CSW will sign off for now as social work intervention is no longer needed. Please consult us  again if new needs arise.      Barriers to Discharge: Continued Medical Work up, SNF Pending bed offer, Insurance Authorization   Patient Goals and CMS Choice Patient states their goals for this hospitalization and ongoing recovery are:: SNF          Discharge Placement                       Discharge Plan and Services Additional resources added to the After Visit Summary for   In-house Referral: Clinical Social Work Discharge Planning Services: CM Consult Post Acute Care Choice: Skilled Nursing Facility                    HH Arranged: PT, OT [facility] Agency: [facility] Date [facility] Agency Contacted: 06/06/24 Time HH Agency Contacted: 1435 Representative spoke with at [facility] Agency: Randel Buss  Social Drivers of  Health (SDOH) Interventions SDOH Screenings   Food Insecurity: No Food Insecurity (06/06/2024)  Housing: Low Risk  (06/06/2024)  Transportation Needs: No Transportation Needs (06/06/2024)  Utilities: Not At Risk (06/08/2024)  Social Connections: Moderately Integrated (06/06/2024)  Tobacco Use: Low Risk  (06/06/2024)     Readmission Risk Interventions    04/14/2024    2:39 PM  Readmission Risk Prevention Plan  Post Dischage Appt Complete  Medication Screening Complete  Transportation Screening Complete

## 2024-06-10 NOTE — Plan of Care (Signed)

## 2024-06-10 NOTE — Progress Notes (Addendum)
 PROGRESS NOTE    Stacy Valencia  ZHY:[id number] DOB: 1933-05-04 DOA: 06/05/2024 PCP: Renetta Carter, MD (Inactive)  Subjective: Pt seen and examined.  Pt is awake and alert. Oriented x 4. C/o of heartburn. On protonix  bid and prn tums.  Discussed with ID pharmacist. ESBL Klebsiella sensitive to cipro . While Klebsiella was resistant on MIC to rocephin , pt's leukocytosis did improve on IV rocephin . Pt has completed 2 days of IV meropenem. Will complete course with 3 days of po cipro .  Pt is afebrile. No leukocytosis. Needs SNF for rehab.   Hospital Course: [name] with medical history significant of atrial fibrillation, hypotension on midodrine , PE DVT on anticoagulation, GERD presenting with weakness, fall, UTI.  Patient reports progressive weakness at home over the past 1 to 2 days.  Was using Tylenol  patient fell at toilet.  No reported head trauma or LOC.  Patient reports mild generalized weakness.  Also with decreased p.o. intake.  No fevers or chills.  Mild nausea.  No belly pain or diarrhea.  Decreased p.o. intake over multiple days.  Noted to have been admitted April 20 through April 24 for issues including choledocholithiasis with concern for early cholecystitis.  Had ERCP with biliary sphincterotomy and sphincteroplasty.  Patient declined cholecystectomy.  No reported right upper quadrant pain. Presented to the ER afebrile, hemodynamically stable.  Satting well on room air.  White count 15.3, hemoglobin 15, platelets 232, creatinine 0.9, AST, ALT as well as T. bili within normal limits.  Magnesium  1.4.  CT head within normal limits.  CT angio chest negative for PE but though showing?  Right lower lobe atelectasis versus early pneumonia.  CT abdomen pelvis with increased urinary bladder wall thickening  Significant Events: Admitted 06/05/2024 for UTI to [facility] program 06-08-2024 had to be transferred back to hospital due to worsening mentation  and urine cx growing ESBL Klebsiella requiring more intensive IV ABX that can be provided at [facility] program.  Admission Labs: WBC 15.3, HgB 15, Plt 232 Na 135, K 3.7, CO2 of 21, BUN 9, Scr 0.89, glu 115 UA cloudy, spg 1.009, nitrite negative, LE Large, WBC >50  Admission Imaging Studies: CT head No acute intracranial abnormality. 2. Stable non contrast CT appearance of chronic white matter disease, and chronic left mastoid air cell opacification CTPA Negative for acute pulmonary embolus. Mild respiratory motion at the right lung base. 2. Generalized increased pulmonary subpleural scarring since a 2014 CTA. Mild left lung atelectasis. More confluent right lower lobe opacity is indeterminate for atelectasis versus developing infection. No consolidation or pleural effusion. 3. CT Abdomen and Pelvis today reported separately. 4. Calcified coronary artery, Aortic Atherosclerosis CT abd/pelvis Mild but increased urinary bladder wall thickening since last year. UTI/Cystitis not excluded. 2. CTA Chest reported separately today but lung bases on these images and compared to November CT Abdomen and Pelvis appear more stable, arguing in favor of atelectasis and scarring rather than acute lung base infection. 3. No other acute or inflammatory process identified in the abdomen or pelvis. Sequelae of ERCP in April. 4. Chronic lower thoracic and lumbar compression fractures. Aortic Atherosclerosis   Significant Labs: Urine culture growing >=100,000 COLONIES/mL KLEBSIELLA PNEUMONIAE  Urine cx shows ESBL Klebsiella  Significant Imaging Studies:   Antibiotic Therapy: Anti-infectives (From admission, onward)    Start     Dose/Rate Route Frequency Ordered Stop   06/10/24 0800  ciprofloxacin  (CIPRO ) tablet 500 mg  500 mg Oral 2 times daily 06/09/24 1318 06/13/24 0759   06/08/24 1530  meropenem (MERREM) 1 g in sodium chloride  0.9 % 100 mL IVPB  Status:  Discontinued        1 g 200 mL/hr over 30 Minutes  Intravenous STAT 06/08/24 1439 06/08/24 1443   06/08/24 1530  meropenem (MERREM) 1 g in sodium chloride  0.9 % 100 mL IVPB        1 g 200 mL/hr over 30 Minutes Intravenous Every 12 hours 06/08/24 1443 06/09/24 2329   06/07/24 1000  cefTRIAXone  (ROCEPHIN ) 1 g in sodium chloride  0.9 % 100 mL IVPB  Status:  Discontinued        1 g 200 mL/hr over 30 Minutes Intravenous Daily 06/06/24 1637 06/08/24 1243   06/06/24 0600  cefTRIAXone  (ROCEPHIN ) 1 g in sodium chloride  0.9 % 100 mL IVPB  Status:  Discontinued        1 g 200 mL/hr over 30 Minutes Intravenous Daily 06/06/24 0533 06/06/24 1637       Procedures:   Consultants:     Assessment and Plan: * Urinary tract infection due to ESBL Klebsiella 06-06-2024 Worsening weakness at home w/ uptrending WBC count and noted cystitis on CT imaging Will place on course of IV rocephin  for infectious coverage pending urine culture and decreased po intake  Transition to oral antibiotics pending urine culture and improvement in po intake   06-07-2024 continue with IV Rocephin . Day #2 of 3. Urine cx growing KLEBSIELLA PNEUMONIAE. Should have final MIC by tomorrow.  06-08-2024 urine cx growing ESBL Klebsiella. Pt transferred back to [facility] hospital for more intensive IV ABX. Change to IV meropenem.  06-09-2024 pt back on in-house hospital unit. On IV meropenem Day #2. Will ask ID pharmacist about duration of therapy.  06-10-2024 Discussed with ID pharmacist. ESBL Klebsiella sensitive to cipro . While Klebsiella was resistant on MIC to rocephin , pt's leukocytosis did improve on IV rocephin . Pt has completed 2 days of IV meropenem. Will complete course with 3 days of po cipro .  Pressure injury of skin 06-06-2024 Present on admission. Wound type: 1.  Deep Tissue Pressure Injury sacrum/buttocks with purple maroon discoloration  2.  R scapula deep tissue pressure injury purple maroon discoloration  3.  L scapula Stage 1Pressure Injury  4.  B heels Stage 1  pressure injuries  Pressure Injury POA: Yes Measurement: see nursing flowsheet  Wound bed: as above  Drainage (amount, consistency, odor) see nursing flowsheet  Periwound: appears to have chronic tissue damage to buttocks, per notes spends most of time sitting in recliner Dressing procedure/placement/frequency:  Cleanse buttocks/sacrum with soap and water, dry and apply Xeroform gauze ([place] [id number]) to areas of purple maroon discoloration daily and secure with silicone foam or ABD pad whichever is preferred.  Apply silicone foam to B scapula and B heels, lift to assess daily.    06-07-2024 add Gerhardt butt cream. Continue local wound care. Offload onto her side and back as much as possible. Outpatient referral made to outpatient wound care. Paramedic to take pictures again tomorrow.  06-08-2024 continue Gerhard butt cream. Wounds appear improved on pictures today.  06-09-2024 continue Gerhardt's butt cream. Wound looking better  06-10-2024 continue wound care.  April 21-2025     June-16-2025     June-17-2025     June 08, 2024     June 09, 2024       Weakness 06-06-2024 due to UTI and dehydration.  Progressive weakness and fall at home  in setting of dehydration, UTI  Clinically dry on exam with worsened skin turgor and dry oral mucosa  No reported head trauma or LOC  Nonfocal neuro exam  Imaging negative for any acute fractures  PT evaluated with recommendation for HHPT  Will plan to coordinate with [facility] and PT  IVF hydration  Treat UTI  Monitor   06-07-2024 continue with ambulation at home with assistance of husband. Renewed orders for Home Health PT/OT. Will add home health RN and nurse aide.  06-08-2024 pt refusing to get out of bed while at [facility] program. Will get PT eval. Will likely need SNF placement.  06-09-2024 transferred back to in-house hospital unit due to weakness/ESBL Kleb UTI. Will need PT assessment. Husband stated yesterday he could not care for  patient at home if she is not walking.  06-10-2024 needs SNF placement for rehab.   Acquired hypothyroidism 06-07-2024. TSH elevated at 9.76. starting synthroid 50 mcg daily. Will need TSH and FT4 repeated in 6 weeks by PCP.  06-08-2024 TSH elevated at 9.76 but Free T4 is elevated at 1.37. may be sick euthyroid syndrome. Will stop synthroid for now. Will need repeat TSH and FT4 in 6 weeks when she is not ill.  Leukocytosis-resolved as of 06/09/2024 06-06-2024 White count 15 on presentation Suspect likely multifactorial contributions of active UTI as well as clinical dehydration as all cell lines appear elevated CT imaging w/ ? PNA vs. Atelectasis  No hypoxia at present  Check PCT to correlate  Urine cultures obtained in the ER Will monitor white count with treatment and hydration Follow  06-07-2024 awaiting labs for today. Hopeful that abx therapy will decrease/resolve leukocytosis.  If not decreasing, could be her decubitus wound on her buttock as another potential source.  06-08-2024 repeat labs today due to transfer back to hospital.  06-09-2024 resolved. WBC 8.5 today.  Protein-calorie malnutrition, severe Nutrition Status: Nutrition Problem: Severe Malnutrition Etiology: social / environmental circumstances Signs/Symptoms: energy intake < or equal to 75% for > or equal to 1 month, percent weight loss (17% weight loss within 6 months) Percent weight loss: 17 % Interventions: Premier Protein, MVI, Education  Chronic anticoagulation - for history of saddle PE in 2014 On Xarelto   History of pulmonary embolism - saddle PE in Jan 2014. on Xarelto  On Xarelto   Transient hypotension 06-06-2024 Systolic BPs in low 100s  Mild exacerbation in setting of dehydration  Cont home midodrine   Monitor   06-07-2024 midodrine  had been started during her April 2025 hospitalization in order for her lopressor  to be uptitrated to control her HR from her rapid afib.  Will continue midodrine   BID.  06-08-2024 stable  06-09-2024 stable.  06-10-2024 stable. Continue 5 mg bid midodrine .  PAF (paroxysmal atrial fibrillation) (HCC) 06-06-2024 Rate controlled at present  Cont home metoprolol  with hold parameters  Cont xarelto   Monitor   06-07-2024 stable. On Xarelto  and lopressor .  06-08-2024 stable  06-09-2024 stable. On xarelto  and lopressor   06-10-2024 stable.  Adjustment disorder with anxious mood 06-06-2024 Minimal to mild agitation on presentation in setting of UTI and dehydration  CT head WNL  Monitor for now   06-07-2024 stable  06-08-2024 add remeron at bedtime for her agitation and also to help her appetite. Remeron 15 mg at bedtime.  06-09-2024 continue with remeron at bedtime.  06-10-2024 continue remeron  GERD (gastroesophageal reflux disease) Cont protonix  bid.   DVT prophylaxis: rivaroxaban  (XARELTO ) tablet 20 mg Start: 06/06/24 1300 rivaroxaban  (XARELTO ) tablet 20 mg  Code Status: Full Code Family Communication: husband not at bedside Disposition Plan: SNF Reason for continuing need for hospitalization: medically stable for transfer to SNF.  Objective: Vitals:   06/10/24 0034 06/10/24 0501 06/10/24 0829 06/10/24 0830  BP: 115/66 106/67 105/71 105/71  Pulse: 92 (!) 112 94 94  Resp: 19  16 16   Temp: 97.6 F (36.4 C) (!) 97.4 F (36.3 C) 97.6 F (36.4 C) 97.6 F (36.4 C)  TempSrc:      SpO2: 96% 94% 96% 96%  Weight:      Height:        Intake/Output Summary (Last 24 hours) at 06/10/2024 1030 Last data filed at 06/09/2024 1850 Gross per 24 hour  Intake 360 ml  Output --  Net 360 ml   Filed Weights   06/07/24 1000 06/09/24 0426  Weight: 56.3 kg 59.4 kg    Examination:  Physical Exam Vitals and nursing note reviewed.  Constitutional:      General: She is not in acute distress.    Appearance: She is not toxic-appearing or diaphoretic.     Comments: Chronically ill appearing  HENT:     Head: Normocephalic and  atraumatic.   Cardiovascular:     Rate and Rhythm: Normal rate and regular rhythm.  Pulmonary:     Effort: Pulmonary effort is normal.     Breath sounds: Normal breath sounds.  Abdominal:     General: Abdomen is flat. There is no distension.     Palpations: Abdomen is soft.     Tenderness: There is no abdominal tenderness.   Skin:    General: Skin is warm and dry.     Capillary Refill: Capillary refill takes less than 2 seconds.   Neurological:     Mental Status: She is alert and oriented to person, place, and time.     Data Reviewed: I have personally reviewed following labs and imaging studies  CBC: Recent Labs  Lab 06/06/24 0053 06/07/24 1110 06/09/24 0331  WBC 15.3* 7.0 8.5  NEUTROABS 10.9*  --  3.8  HGB 15.0 13.5 13.9  HCT 44.6 40.0 41.2  MCV 104.2* 102.6* 102.5*  PLT 232 221 216   Basic Metabolic Panel: Recent Labs  Lab 06/06/24 0053 06/06/24 0251 06/07/24 1110 06/09/24 0331 06/10/24 0406  NA 135  --  137 135 133*  K 3.7  --  4.1 4.0 4.1  CL 105  --  108 105 102  CO2 21*  --  22 23 23   GLUCOSE 115*  --  94 89 103*  BUN 9  --  9 7* 7*  CREATININE 0.89  --  0.75 0.83 0.58  CALCIUM  8.3*  --  8.8* 8.8* 9.3  MG  --  1.4* 1.9 1.9  --    GFR: Estimated Creatinine Clearance: 42.9 mL/min (by C-G formula based on SCr of 0.58 mg/dL). Liver Function Tests: Recent Labs  Lab 06/06/24 0053 06/07/24 1110  AST 35 48*  ALT 10 11  ALKPHOS 75 61  BILITOT 1.1 1.0  PROT 5.7* 5.5*  ALBUMIN 2.7* 2.6*   BNP (last 3 results) Recent Labs    11/19/23 0553  BNP 437.2*   Thyroid  Function Tests: Recent Labs    06/07/24 1109 06/07/24 1110  TSH  --  9.764*  FREET4 1.37*  --    Sepsis Labs: Recent Labs  Lab 06/06/24 0251  PROCALCITON <0.10    Recent Results (from the past 240 hours)  Urine Culture     Status:  Abnormal   Collection Time: 06/06/24 12:37 AM   Specimen: Urine, Random  Result Value Ref Range Status   Specimen Description URINE, RANDOM   Final   Special Requests   Final    NONE Reflexed from [id number] Performed at [facility], [address], [place], [place] [address]    Culture (A)  Final    >=100,000 COLONIES/mL KLEBSIELLA PNEUMONIAE Confirmed Extended Spectrum Beta-Lactamase Producer (ESBL).  In bloodstream infections from ESBL organisms, carbapenems are preferred over piperacillin/tazobactam. They are shown to have a lower risk of mortality.    Report Status 06/08/2024 FINAL  Final   Organism ID, Bacteria KLEBSIELLA PNEUMONIAE (A)  Final      Susceptibility   Klebsiella pneumoniae - MIC*    AMPICILLIN >=32 RESISTANT Resistant     CEFAZOLIN  >=64 RESISTANT Resistant     CEFEPIME >=32 RESISTANT Resistant     CEFTRIAXONE  >=64 RESISTANT Resistant     CIPROFLOXACIN  <=0.25 SENSITIVE Sensitive     GENTAMICIN <=1 SENSITIVE Sensitive     IMIPENEM 0.5 SENSITIVE Sensitive     NITROFURANTOIN  64 INTERMEDIATE Intermediate     TRIMETH/SULFA >=320 RESISTANT Resistant     AMPICILLIN/SULBACTAM >=32 RESISTANT Resistant     PIP/TAZO <=4 SENSITIVE Sensitive ug/mL    * >=100,000 COLONIES/mL KLEBSIELLA PNEUMONIAE     Scheduled Meds:  ciprofloxacin   500 mg Oral BID   Gerhardt's butt cream   Topical BID   melatonin  10 mg Oral QHS   metoprolol  tartrate  12.5 mg Oral BID   midodrine   5 mg Oral BID WC   mirtazapine  15 mg Oral QHS   multivitamin with minerals  1 tablet Oral Daily   nutrition supplement (JUVEN)  1 packet Oral BID BM   pantoprazole   40 mg Oral BID   rivaroxaban   20 mg Oral Daily   thiamine  100 mg Oral Daily   Continuous Infusions:   LOS: 4 days   Time spent: 55 minutes  Unk Garb, DO  [facility]  06/10/2024, 10:30 AM

## 2024-06-13 DIAGNOSIS — I959 Hypotension, unspecified: Secondary | ICD-10-CM | POA: Diagnosis not present

## 2024-06-13 DIAGNOSIS — K219 Gastro-esophageal reflux disease without esophagitis: Secondary | ICD-10-CM | POA: Diagnosis not present

## 2024-06-13 DIAGNOSIS — I480 Paroxysmal atrial fibrillation: Secondary | ICD-10-CM | POA: Diagnosis not present

## 2024-06-13 DIAGNOSIS — E43 Unspecified severe protein-calorie malnutrition: Secondary | ICD-10-CM | POA: Diagnosis not present

## 2024-06-15 DIAGNOSIS — M6281 Muscle weakness (generalized): Secondary | ICD-10-CM | POA: Diagnosis not present

## 2024-06-15 DIAGNOSIS — Z9181 History of falling: Secondary | ICD-10-CM | POA: Diagnosis not present

## 2024-06-15 DIAGNOSIS — R2689 Other abnormalities of gait and mobility: Secondary | ICD-10-CM | POA: Diagnosis not present

## 2024-06-15 DIAGNOSIS — F32A Depression, unspecified: Secondary | ICD-10-CM | POA: Diagnosis not present

## 2024-06-15 DIAGNOSIS — I959 Hypotension, unspecified: Secondary | ICD-10-CM | POA: Diagnosis not present

## 2024-06-15 DIAGNOSIS — E43 Unspecified severe protein-calorie malnutrition: Secondary | ICD-10-CM | POA: Diagnosis not present

## 2024-06-15 DIAGNOSIS — F419 Anxiety disorder, unspecified: Secondary | ICD-10-CM | POA: Diagnosis not present

## 2024-06-15 DIAGNOSIS — M6259 Muscle wasting and atrophy, not elsewhere classified, multiple sites: Secondary | ICD-10-CM | POA: Diagnosis not present

## 2024-06-15 DIAGNOSIS — I480 Paroxysmal atrial fibrillation: Secondary | ICD-10-CM | POA: Diagnosis not present

## 2024-06-16 DIAGNOSIS — I959 Hypotension, unspecified: Secondary | ICD-10-CM | POA: Diagnosis not present

## 2024-06-16 DIAGNOSIS — R296 Repeated falls: Secondary | ICD-10-CM | POA: Diagnosis not present

## 2024-06-16 DIAGNOSIS — K219 Gastro-esophageal reflux disease without esophagitis: Secondary | ICD-10-CM | POA: Diagnosis not present

## 2024-06-16 DIAGNOSIS — I480 Paroxysmal atrial fibrillation: Secondary | ICD-10-CM | POA: Diagnosis not present

## 2024-06-16 DIAGNOSIS — N390 Urinary tract infection, site not specified: Secondary | ICD-10-CM | POA: Diagnosis not present

## 2024-06-16 DIAGNOSIS — I2699 Other pulmonary embolism without acute cor pulmonale: Secondary | ICD-10-CM | POA: Diagnosis not present

## 2024-06-21 DIAGNOSIS — R296 Repeated falls: Secondary | ICD-10-CM | POA: Diagnosis not present

## 2024-06-22 DIAGNOSIS — I480 Paroxysmal atrial fibrillation: Secondary | ICD-10-CM | POA: Diagnosis not present

## 2024-06-22 DIAGNOSIS — Z9181 History of falling: Secondary | ICD-10-CM | POA: Diagnosis not present

## 2024-06-22 DIAGNOSIS — M6259 Muscle wasting and atrophy, not elsewhere classified, multiple sites: Secondary | ICD-10-CM | POA: Diagnosis not present

## 2024-06-22 DIAGNOSIS — F419 Anxiety disorder, unspecified: Secondary | ICD-10-CM | POA: Diagnosis not present

## 2024-06-22 DIAGNOSIS — M6281 Muscle weakness (generalized): Secondary | ICD-10-CM | POA: Diagnosis not present

## 2024-06-22 DIAGNOSIS — R2689 Other abnormalities of gait and mobility: Secondary | ICD-10-CM | POA: Diagnosis not present

## 2024-06-22 DIAGNOSIS — E43 Unspecified severe protein-calorie malnutrition: Secondary | ICD-10-CM | POA: Diagnosis not present

## 2024-06-22 DIAGNOSIS — F32A Depression, unspecified: Secondary | ICD-10-CM | POA: Diagnosis not present

## 2024-06-28 DIAGNOSIS — M549 Dorsalgia, unspecified: Secondary | ICD-10-CM | POA: Diagnosis not present

## 2024-06-29 DIAGNOSIS — I480 Paroxysmal atrial fibrillation: Secondary | ICD-10-CM | POA: Diagnosis not present

## 2024-06-29 DIAGNOSIS — Z9181 History of falling: Secondary | ICD-10-CM | POA: Diagnosis not present

## 2024-06-29 DIAGNOSIS — R2689 Other abnormalities of gait and mobility: Secondary | ICD-10-CM | POA: Diagnosis not present

## 2024-06-29 DIAGNOSIS — E43 Unspecified severe protein-calorie malnutrition: Secondary | ICD-10-CM | POA: Diagnosis not present

## 2024-06-29 DIAGNOSIS — F419 Anxiety disorder, unspecified: Secondary | ICD-10-CM | POA: Diagnosis not present

## 2024-06-29 DIAGNOSIS — F32A Depression, unspecified: Secondary | ICD-10-CM | POA: Diagnosis not present

## 2024-06-29 DIAGNOSIS — M6281 Muscle weakness (generalized): Secondary | ICD-10-CM | POA: Diagnosis not present

## 2024-06-29 DIAGNOSIS — M6259 Muscle wasting and atrophy, not elsewhere classified, multiple sites: Secondary | ICD-10-CM | POA: Diagnosis not present

## 2024-07-06 DIAGNOSIS — E43 Unspecified severe protein-calorie malnutrition: Secondary | ICD-10-CM | POA: Diagnosis not present

## 2024-07-06 DIAGNOSIS — Z9181 History of falling: Secondary | ICD-10-CM | POA: Diagnosis not present

## 2024-07-06 DIAGNOSIS — F419 Anxiety disorder, unspecified: Secondary | ICD-10-CM | POA: Diagnosis not present

## 2024-07-06 DIAGNOSIS — R2689 Other abnormalities of gait and mobility: Secondary | ICD-10-CM | POA: Diagnosis not present

## 2024-07-06 DIAGNOSIS — F32A Depression, unspecified: Secondary | ICD-10-CM | POA: Diagnosis not present

## 2024-07-06 DIAGNOSIS — M6281 Muscle weakness (generalized): Secondary | ICD-10-CM | POA: Diagnosis not present

## 2024-07-06 DIAGNOSIS — I480 Paroxysmal atrial fibrillation: Secondary | ICD-10-CM | POA: Diagnosis not present

## 2024-07-06 DIAGNOSIS — M6259 Muscle wasting and atrophy, not elsewhere classified, multiple sites: Secondary | ICD-10-CM | POA: Diagnosis not present

## 2024-07-12 DIAGNOSIS — E559 Vitamin D deficiency, unspecified: Secondary | ICD-10-CM | POA: Diagnosis not present

## 2024-07-12 DIAGNOSIS — I480 Paroxysmal atrial fibrillation: Secondary | ICD-10-CM | POA: Diagnosis not present

## 2024-07-12 DIAGNOSIS — F32A Depression, unspecified: Secondary | ICD-10-CM | POA: Diagnosis not present

## 2024-07-12 DIAGNOSIS — K219 Gastro-esophageal reflux disease without esophagitis: Secondary | ICD-10-CM | POA: Diagnosis not present

## 2024-07-13 DIAGNOSIS — M6259 Muscle wasting and atrophy, not elsewhere classified, multiple sites: Secondary | ICD-10-CM | POA: Diagnosis not present

## 2024-07-13 DIAGNOSIS — F32A Depression, unspecified: Secondary | ICD-10-CM | POA: Diagnosis not present

## 2024-07-13 DIAGNOSIS — F419 Anxiety disorder, unspecified: Secondary | ICD-10-CM | POA: Diagnosis not present

## 2024-07-13 DIAGNOSIS — I480 Paroxysmal atrial fibrillation: Secondary | ICD-10-CM | POA: Diagnosis not present

## 2024-07-13 DIAGNOSIS — E43 Unspecified severe protein-calorie malnutrition: Secondary | ICD-10-CM | POA: Diagnosis not present

## 2024-07-13 DIAGNOSIS — Z9181 History of falling: Secondary | ICD-10-CM | POA: Diagnosis not present

## 2024-07-13 DIAGNOSIS — M6281 Muscle weakness (generalized): Secondary | ICD-10-CM | POA: Diagnosis not present

## 2024-07-13 DIAGNOSIS — R2689 Other abnormalities of gait and mobility: Secondary | ICD-10-CM | POA: Diagnosis not present

## 2024-07-19 DIAGNOSIS — I480 Paroxysmal atrial fibrillation: Secondary | ICD-10-CM | POA: Diagnosis not present

## 2024-07-19 DIAGNOSIS — K219 Gastro-esophageal reflux disease without esophagitis: Secondary | ICD-10-CM | POA: Diagnosis not present

## 2024-07-19 DIAGNOSIS — I2699 Other pulmonary embolism without acute cor pulmonale: Secondary | ICD-10-CM | POA: Diagnosis not present

## 2024-07-19 DIAGNOSIS — F32A Depression, unspecified: Secondary | ICD-10-CM | POA: Diagnosis not present

## 2024-07-20 DIAGNOSIS — I480 Paroxysmal atrial fibrillation: Secondary | ICD-10-CM | POA: Diagnosis not present

## 2024-07-20 DIAGNOSIS — R2689 Other abnormalities of gait and mobility: Secondary | ICD-10-CM | POA: Diagnosis not present

## 2024-07-20 DIAGNOSIS — F32A Depression, unspecified: Secondary | ICD-10-CM | POA: Diagnosis not present

## 2024-07-20 DIAGNOSIS — M6281 Muscle weakness (generalized): Secondary | ICD-10-CM | POA: Diagnosis not present

## 2024-07-20 DIAGNOSIS — F419 Anxiety disorder, unspecified: Secondary | ICD-10-CM | POA: Diagnosis not present

## 2024-07-20 DIAGNOSIS — Z9181 History of falling: Secondary | ICD-10-CM | POA: Diagnosis not present

## 2024-07-20 DIAGNOSIS — E43 Unspecified severe protein-calorie malnutrition: Secondary | ICD-10-CM | POA: Diagnosis not present

## 2024-07-20 DIAGNOSIS — M6259 Muscle wasting and atrophy, not elsewhere classified, multiple sites: Secondary | ICD-10-CM | POA: Diagnosis not present

## 2024-07-25 DIAGNOSIS — Z86711 Personal history of pulmonary embolism: Secondary | ICD-10-CM | POA: Diagnosis not present

## 2024-07-25 DIAGNOSIS — M6250 Muscle wasting and atrophy, not elsewhere classified, unspecified site: Secondary | ICD-10-CM | POA: Diagnosis not present

## 2024-07-25 DIAGNOSIS — R7309 Other abnormal glucose: Secondary | ICD-10-CM | POA: Diagnosis not present

## 2024-07-25 DIAGNOSIS — Z09 Encounter for follow-up examination after completed treatment for conditions other than malignant neoplasm: Secondary | ICD-10-CM | POA: Diagnosis not present

## 2024-07-25 DIAGNOSIS — R946 Abnormal results of thyroid function studies: Secondary | ICD-10-CM | POA: Diagnosis not present

## 2024-07-25 DIAGNOSIS — M6281 Muscle weakness (generalized): Secondary | ICD-10-CM | POA: Diagnosis not present

## 2024-07-25 DIAGNOSIS — Z8639 Personal history of other endocrine, nutritional and metabolic disease: Secondary | ICD-10-CM | POA: Diagnosis not present

## 2024-07-25 DIAGNOSIS — E43 Unspecified severe protein-calorie malnutrition: Secondary | ICD-10-CM | POA: Diagnosis not present

## 2024-07-26 DIAGNOSIS — I480 Paroxysmal atrial fibrillation: Secondary | ICD-10-CM | POA: Diagnosis not present

## 2024-07-26 DIAGNOSIS — I9589 Other hypotension: Secondary | ICD-10-CM | POA: Diagnosis not present

## 2024-07-26 DIAGNOSIS — E039 Hypothyroidism, unspecified: Secondary | ICD-10-CM | POA: Diagnosis not present

## 2024-07-26 DIAGNOSIS — Z87891 Personal history of nicotine dependence: Secondary | ICD-10-CM | POA: Diagnosis not present

## 2024-07-26 DIAGNOSIS — E43 Unspecified severe protein-calorie malnutrition: Secondary | ICD-10-CM | POA: Diagnosis not present

## 2024-07-26 DIAGNOSIS — F4322 Adjustment disorder with anxiety: Secondary | ICD-10-CM | POA: Diagnosis not present

## 2024-07-26 DIAGNOSIS — I700 Atherosclerosis of aorta: Secondary | ICD-10-CM | POA: Diagnosis not present

## 2024-07-26 DIAGNOSIS — M4856XD Collapsed vertebra, not elsewhere classified, lumbar region, subsequent encounter for fracture with routine healing: Secondary | ICD-10-CM | POA: Diagnosis not present

## 2024-07-26 DIAGNOSIS — K219 Gastro-esophageal reflux disease without esophagitis: Secondary | ICD-10-CM | POA: Diagnosis not present

## 2024-07-26 DIAGNOSIS — M4854XD Collapsed vertebra, not elsewhere classified, thoracic region, subsequent encounter for fracture with routine healing: Secondary | ICD-10-CM | POA: Diagnosis not present

## 2024-07-26 DIAGNOSIS — I2510 Atherosclerotic heart disease of native coronary artery without angina pectoris: Secondary | ICD-10-CM | POA: Diagnosis not present

## 2024-07-26 DIAGNOSIS — Z87440 Personal history of urinary (tract) infections: Secondary | ICD-10-CM | POA: Diagnosis not present

## 2024-07-28 ENCOUNTER — Other Ambulatory Visit: Payer: Self-pay

## 2024-07-28 ENCOUNTER — Encounter (HOSPITAL_COMMUNITY): Payer: Self-pay

## 2024-07-28 ENCOUNTER — Emergency Department (HOSPITAL_COMMUNITY)

## 2024-07-28 ENCOUNTER — Inpatient Hospital Stay (HOSPITAL_COMMUNITY)
Admission: EM | Admit: 2024-07-28 | Discharge: 2024-08-03 | DRG: 312 | Disposition: A | Discharge status: Skilled Nursing Facility | Attending: Internal Medicine | Admitting: Internal Medicine

## 2024-07-28 DIAGNOSIS — Z9049 Acquired absence of other specified parts of digestive tract: Secondary | ICD-10-CM | POA: Diagnosis not present

## 2024-07-28 DIAGNOSIS — E039 Hypothyroidism, unspecified: Secondary | ICD-10-CM | POA: Diagnosis not present

## 2024-07-28 DIAGNOSIS — Z79899 Other long term (current) drug therapy: Secondary | ICD-10-CM

## 2024-07-28 DIAGNOSIS — Z86711 Personal history of pulmonary embolism: Secondary | ICD-10-CM | POA: Diagnosis not present

## 2024-07-28 DIAGNOSIS — S0990XA Unspecified injury of head, initial encounter: Secondary | ICD-10-CM | POA: Diagnosis not present

## 2024-07-28 DIAGNOSIS — R000 Tachycardia, unspecified: Secondary | ICD-10-CM | POA: Diagnosis not present

## 2024-07-28 DIAGNOSIS — M6259 Muscle wasting and atrophy, not elsewhere classified, multiple sites: Secondary | ICD-10-CM | POA: Diagnosis not present

## 2024-07-28 DIAGNOSIS — R531 Weakness: Secondary | ICD-10-CM | POA: Diagnosis not present

## 2024-07-28 DIAGNOSIS — I700 Atherosclerosis of aorta: Secondary | ICD-10-CM | POA: Diagnosis not present

## 2024-07-28 DIAGNOSIS — M6281 Muscle weakness (generalized): Secondary | ICD-10-CM | POA: Diagnosis not present

## 2024-07-28 DIAGNOSIS — Z90710 Acquired absence of both cervix and uterus: Secondary | ICD-10-CM

## 2024-07-28 DIAGNOSIS — N289 Disorder of kidney and ureter, unspecified: Secondary | ICD-10-CM

## 2024-07-28 DIAGNOSIS — R2689 Other abnormalities of gait and mobility: Secondary | ICD-10-CM | POA: Diagnosis not present

## 2024-07-28 DIAGNOSIS — K219 Gastro-esophageal reflux disease without esophagitis: Secondary | ICD-10-CM | POA: Diagnosis not present

## 2024-07-28 DIAGNOSIS — A084 Viral intestinal infection, unspecified: Secondary | ICD-10-CM | POA: Diagnosis not present

## 2024-07-28 DIAGNOSIS — N179 Acute kidney failure, unspecified: Secondary | ICD-10-CM | POA: Diagnosis present

## 2024-07-28 DIAGNOSIS — I5032 Chronic diastolic (congestive) heart failure: Secondary | ICD-10-CM | POA: Diagnosis present

## 2024-07-28 DIAGNOSIS — R55 Syncope and collapse: Secondary | ICD-10-CM | POA: Diagnosis not present

## 2024-07-28 DIAGNOSIS — K2100 Gastro-esophageal reflux disease with esophagitis, without bleeding: Secondary | ICD-10-CM

## 2024-07-28 DIAGNOSIS — Z1152 Encounter for screening for COVID-19: Secondary | ICD-10-CM

## 2024-07-28 DIAGNOSIS — Z882 Allergy status to sulfonamides status: Secondary | ICD-10-CM

## 2024-07-28 DIAGNOSIS — I480 Paroxysmal atrial fibrillation: Secondary | ICD-10-CM | POA: Diagnosis present

## 2024-07-28 DIAGNOSIS — Z86718 Personal history of other venous thrombosis and embolism: Secondary | ICD-10-CM | POA: Diagnosis not present

## 2024-07-28 DIAGNOSIS — Z7901 Long term (current) use of anticoagulants: Secondary | ICD-10-CM

## 2024-07-28 DIAGNOSIS — Z9181 History of falling: Secondary | ICD-10-CM | POA: Diagnosis not present

## 2024-07-28 DIAGNOSIS — I4819 Other persistent atrial fibrillation: Secondary | ICD-10-CM | POA: Diagnosis not present

## 2024-07-28 DIAGNOSIS — E872 Acidosis, unspecified: Secondary | ICD-10-CM | POA: Diagnosis not present

## 2024-07-28 DIAGNOSIS — E43 Unspecified severe protein-calorie malnutrition: Secondary | ICD-10-CM | POA: Diagnosis not present

## 2024-07-28 DIAGNOSIS — Z7401 Bed confinement status: Secondary | ICD-10-CM | POA: Diagnosis not present

## 2024-07-28 DIAGNOSIS — I959 Hypotension, unspecified: Secondary | ICD-10-CM | POA: Diagnosis present

## 2024-07-28 DIAGNOSIS — I951 Orthostatic hypotension: Secondary | ICD-10-CM | POA: Diagnosis not present

## 2024-07-28 DIAGNOSIS — R41841 Cognitive communication deficit: Secondary | ICD-10-CM | POA: Diagnosis not present

## 2024-07-28 DIAGNOSIS — R6510 Systemic inflammatory response syndrome (SIRS) of non-infectious origin without acute organ dysfunction: Secondary | ICD-10-CM | POA: Diagnosis present

## 2024-07-28 DIAGNOSIS — R112 Nausea with vomiting, unspecified: Secondary | ICD-10-CM | POA: Diagnosis present

## 2024-07-28 DIAGNOSIS — F4322 Adjustment disorder with anxiety: Secondary | ICD-10-CM | POA: Diagnosis not present

## 2024-07-28 DIAGNOSIS — R6520 Severe sepsis without septic shock: Principal | ICD-10-CM

## 2024-07-28 DIAGNOSIS — A419 Sepsis, unspecified organism: Principal | ICD-10-CM

## 2024-07-28 DIAGNOSIS — I4891 Unspecified atrial fibrillation: Secondary | ICD-10-CM | POA: Diagnosis not present

## 2024-07-28 DIAGNOSIS — R9082 White matter disease, unspecified: Secondary | ICD-10-CM | POA: Diagnosis not present

## 2024-07-28 DIAGNOSIS — R4182 Altered mental status, unspecified: Secondary | ICD-10-CM | POA: Diagnosis not present

## 2024-07-28 LAB — CBC WITH DIFFERENTIAL/PLATELET
Abs Immature Granulocytes: 0.11 K/uL — ABNORMAL HIGH (ref 0.00–0.07)
Basophils Absolute: 0.1 K/uL (ref 0.0–0.1)
Basophils Relative: 0 %
Eosinophils Absolute: 0 K/uL (ref 0.0–0.5)
Eosinophils Relative: 0 %
HCT: 45.1 % (ref 36.0–46.0)
Hemoglobin: 14.7 g/dL (ref 12.0–15.0)
Immature Granulocytes: 1 %
Lymphocytes Relative: 8 %
Lymphs Abs: 1.2 K/uL (ref 0.7–4.0)
MCH: 34.3 pg — ABNORMAL HIGH (ref 26.0–34.0)
MCHC: 32.6 g/dL (ref 30.0–36.0)
MCV: 105.1 fL — ABNORMAL HIGH (ref 80.0–100.0)
Monocytes Absolute: 0.7 K/uL (ref 0.1–1.0)
Monocytes Relative: 4 %
Neutro Abs: 13.6 K/uL — ABNORMAL HIGH (ref 1.7–7.7)
Neutrophils Relative %: 87 %
Platelets: 218 K/uL (ref 150–400)
RBC: 4.29 MIL/uL (ref 3.87–5.11)
RDW: 14.4 % (ref 11.5–15.5)
WBC: 15.7 K/uL — ABNORMAL HIGH (ref 4.0–10.5)
nRBC: 0 % (ref 0.0–0.2)

## 2024-07-28 LAB — I-STAT CG4 LACTIC ACID, ED
Lactic Acid, Venous: 4.4 mmol/L (ref 0.5–1.9)
Lactic Acid, Venous: 4.7 mmol/L (ref 0.5–1.9)

## 2024-07-28 LAB — URINALYSIS, W/ REFLEX TO CULTURE (INFECTION SUSPECTED)
Bilirubin Urine: NEGATIVE
Glucose, UA: NEGATIVE mg/dL
Hgb urine dipstick: NEGATIVE
Ketones, ur: NEGATIVE mg/dL
Leukocytes,Ua: NEGATIVE
Nitrite: NEGATIVE
Protein, ur: NEGATIVE mg/dL
Specific Gravity, Urine: 1.013 (ref 1.005–1.030)
pH: 5 (ref 5.0–8.0)

## 2024-07-28 LAB — LACTIC ACID, PLASMA
Lactic Acid, Venous: 4.7 mmol/L (ref 0.5–1.9)
Lactic Acid, Venous: 3.6 mmol/L (ref 0.5–1.9)

## 2024-07-28 LAB — COMPREHENSIVE METABOLIC PANEL WITH GFR
ALT: 14 U/L (ref 0–44)
AST: 38 U/L (ref 15–41)
Albumin: 3.1 g/dL — ABNORMAL LOW (ref 3.5–5.0)
Alkaline Phosphatase: 76 U/L (ref 38–126)
Anion gap: 13 (ref 5–15)
BUN: 12 mg/dL (ref 8–23)
CO2: 22 mmol/L (ref 22–32)
Calcium: 9.2 mg/dL (ref 8.9–10.3)
Chloride: 106 mmol/L (ref 98–111)
Creatinine, Ser: 1.06 mg/dL — ABNORMAL HIGH (ref 0.44–1.00)
GFR, Estimated: 50 mL/min — ABNORMAL LOW (ref >60)
Glucose, Bld: 158 mg/dL — ABNORMAL HIGH (ref 70–99)
Potassium: 4.1 mmol/L (ref 3.5–5.1)
Sodium: 141 mmol/L (ref 135–145)
Total Bilirubin: 0.9 mg/dL (ref 0.0–1.2)
Total Protein: 6.4 g/dL — ABNORMAL LOW (ref 6.5–8.1)

## 2024-07-28 LAB — T4, FREE: Free T4: 1.05 ng/dL (ref 0.61–1.12)

## 2024-07-28 LAB — SEDIMENTATION RATE: Sed Rate: 17 mm/h (ref 0–22)

## 2024-07-28 LAB — I-STAT CHEM 8, ED
BUN: 11 mg/dL (ref 8–23)
Calcium, Ion: 1.19 mmol/L (ref 1.15–1.40)
Chloride: 106 mmol/L (ref 98–111)
Creatinine, Ser: 0.8 mg/dL (ref 0.44–1.00)
Glucose, Bld: 152 mg/dL — ABNORMAL HIGH (ref 70–99)
HCT: 45 % (ref 36.0–46.0)
Hemoglobin: 15.3 g/dL — ABNORMAL HIGH (ref 12.0–15.0)
Potassium: 4 mmol/L (ref 3.5–5.1)
Sodium: 142 mmol/L (ref 135–145)
TCO2: 20 mmol/L — ABNORMAL LOW (ref 22–32)

## 2024-07-28 LAB — C-REACTIVE PROTEIN: CRP: 3.7 mg/dL — ABNORMAL HIGH (ref <1.0)

## 2024-07-28 LAB — TROPONIN I (HIGH SENSITIVITY)
Troponin I (High Sensitivity): 12 ng/L (ref <18)
Troponin I (High Sensitivity): 13 ng/L (ref <18)

## 2024-07-28 LAB — D-DIMER, QUANTITATIVE: D-Dimer, Quant: 0.33 ug{FEU}/mL (ref 0.00–0.50)

## 2024-07-28 LAB — RESP PANEL BY RT-PCR (RSV, FLU A&B, COVID)  RVPGX2
Influenza A by PCR: NEGATIVE
Influenza B by PCR: NEGATIVE
Resp Syncytial Virus by PCR: NEGATIVE
SARS Coronavirus 2 by RT PCR: NEGATIVE

## 2024-07-28 LAB — TSH: TSH: 4.612 u[IU]/mL — ABNORMAL HIGH (ref 0.350–4.500)

## 2024-07-28 MED ORDER — SODIUM CHLORIDE 0.9 % IV SOLN
1.0000 g | Freq: Once | INTRAVENOUS | Status: DC
  Filled 2024-07-28: qty 20

## 2024-07-28 MED ORDER — OMEPRAZOLE MAGNESIUM 20 MG PO TBEC: 20.0000 mg | DELAYED_RELEASE_TABLET | Freq: Every day | ORAL | Status: DC

## 2024-07-28 MED ORDER — VANCOMYCIN HCL IN DEXTROSE 1-5 GM/200ML-% IV SOLN
1000.0000 mg | Freq: Once | INTRAVENOUS | Status: AC
  Administered 2024-07-28: 1000 mg via INTRAVENOUS
  Filled 2024-07-28: qty 200

## 2024-07-28 MED ORDER — LACTATED RINGERS IV SOLN: INTRAVENOUS | Status: DC

## 2024-07-28 MED ORDER — SODIUM CHLORIDE 0.9% FLUSH
3.0000 mL | Freq: Two times a day (BID) | INTRAVENOUS | Status: DC
  Administered 2024-07-28 – 2024-08-03 (×17): 3 mL via INTRAVENOUS

## 2024-07-28 MED ORDER — ONDANSETRON HCL 4 MG/2ML IJ SOLN: 4.0000 mg | Freq: Four times a day (QID) | INTRAMUSCULAR | Status: DC | PRN

## 2024-07-28 MED ORDER — LACTATED RINGERS IV BOLUS: 500.0000 mL | Freq: Once | INTRAVENOUS | Status: DC

## 2024-07-28 MED ORDER — RIVAROXABAN 20 MG PO TABS
20.0000 mg | ORAL_TABLET | Freq: Every day | ORAL | Status: DC
  Administered 2024-07-29 – 2024-08-02 (×7): 20 mg via ORAL
  Filled 2024-07-28 (×5): qty 1

## 2024-07-28 MED ORDER — CALCIUM CARBONATE ANTACID 500 MG PO CHEW: 1.0000 | CHEWABLE_TABLET | Freq: Four times a day (QID) | ORAL | Status: DC | PRN

## 2024-07-28 MED ORDER — ACETAMINOPHEN 325 MG PO TABS: 650.0000 mg | ORAL_TABLET | Freq: Four times a day (QID) | ORAL | Status: DC | PRN

## 2024-07-28 MED ORDER — ENOXAPARIN SODIUM 40 MG/0.4ML IJ SOSY
40.0000 mg | PREFILLED_SYRINGE | INTRAMUSCULAR | Status: DC
  Filled 2024-07-28: qty 0.4

## 2024-07-28 MED ORDER — SODIUM CHLORIDE 0.9 % IV SOLN
1.0000 g | Freq: Two times a day (BID) | INTRAVENOUS | Status: DC
  Administered 2024-07-28 – 2024-07-29 (×2): 1 g via INTRAVENOUS
  Filled 2024-07-28 (×3): qty 20

## 2024-07-28 MED ORDER — LACTATED RINGERS IV BOLUS
1000.0000 mL | Freq: Once | INTRAVENOUS | Status: AC
  Administered 2024-07-28: 1000 mL via INTRAVENOUS

## 2024-07-28 MED ORDER — ACETAMINOPHEN 650 MG RE SUPP
650.0000 mg | Freq: Four times a day (QID) | RECTAL | Status: DC | PRN
Start: 2024-07-28 — End: 2024-08-03

## 2024-07-28 MED ORDER — TRIMETHOBENZAMIDE HCL 100 MG/ML IM SOLN: 200.0000 mg | Freq: Four times a day (QID) | INTRAMUSCULAR | Status: DC | PRN

## 2024-07-28 MED ORDER — PANTOPRAZOLE SODIUM 40 MG PO TBEC
40.0000 mg | DELAYED_RELEASE_TABLET | Freq: Every day | ORAL | Status: DC
  Administered 2024-07-28 – 2024-08-03 (×10): 40 mg via ORAL
  Filled 2024-07-28: qty 1
  Filled 2024-07-28: qty 2
  Filled 2024-07-28 (×5): qty 1

## 2024-07-28 MED ORDER — VANCOMYCIN HCL 750 MG/150ML IV SOLN
750.0000 mg | INTRAVENOUS | Status: DC
  Administered 2024-07-29: 750 mg via INTRAVENOUS
  Filled 2024-07-28: qty 150

## 2024-07-28 MED ORDER — SODIUM CHLORIDE 0.9 % IV SOLN
1.0000 g | INTRAVENOUS | Status: AC
  Administered 2024-07-28: 1 g via INTRAVENOUS
  Filled 2024-07-28: qty 20

## 2024-07-28 MED ORDER — ONDANSETRON HCL 4 MG PO TABS
4.0000 mg | ORAL_TABLET | Freq: Four times a day (QID) | ORAL | Status: DC | PRN
Start: 2024-07-28 — End: 2024-07-28

## 2024-07-28 MED ORDER — MIDODRINE HCL 5 MG PO TABS
5.0000 mg | ORAL_TABLET | ORAL | Status: DC
  Administered 2024-07-29: 5 mg via ORAL
  Filled 2024-07-28: qty 1

## 2024-07-28 MED ORDER — MIDODRINE HCL 5 MG PO TABS
10.0000 mg | ORAL_TABLET | Freq: Once | ORAL | Status: AC
  Administered 2024-07-28: 10 mg via ORAL
  Filled 2024-07-28: qty 2

## 2024-07-28 MED ORDER — LACTATED RINGERS IV BOLUS
500.0000 mL | Freq: Once | INTRAVENOUS | Status: AC
  Administered 2024-07-28: 500 mL via INTRAVENOUS

## 2024-07-28 NOTE — Progress Notes (Signed)
 Pharmacy Antibiotic Note  Stacy Valencia is a 89 y.o. female admitted on 07/28/2024 with sepsis.  Pharmacy has been consulted for vanc dosing.  Pt with a hx of ESBL infection who was admitted for syncope. R/o sepsis with vanc/merrem .   Scr <1  Plan: Vanc 1g x1 then 750mg  IV q24>>AUC 441, scr 0.8 Levels as needed  Height: 5' 5 (165.1 cm) Weight: 59 kg (130 lb 1.1 oz) IBW/kg (Calculated) : 57  Temp (24hrs), Avg:98.5 F (36.9 C), Min:97.6 F (36.4 C), Max:99.3 F (37.4 C)  Recent Labs  Lab 07/28/24 1123 07/28/24 1145 07/28/24 1328 07/28/24 1401 07/28/24 1630  WBC 15.7*  --   --   --   --   CREATININE 1.06* 0.80  --   --   --   LATICACIDVEN  --  4.4* 4.7* 4.7* 3.6*    Estimated Creatinine Clearance: 42.9 mL/min (by C-G formula based on SCr of 0.8 mg/dL).    Allergies  Allergen Reactions   Sulfa Antibiotics Nausea And Vomiting and Other (See Comments)    Bad reaction as a teenager- only occurred with a tablet/oral formulation    Antimicrobials this admission: 8/7 merrem >> 8/7 vanc>>  Dose adjustments this admission:   Microbiology results: 8/7 urine>> 8/7 blood>>  Sergio Batch, PharmD, [place], AAHIVP, CPP Infectious Disease Pharmacist 07/28/2024 5:38 PM

## 2024-07-28 NOTE — ED Provider Notes (Signed)
 [place] EMERGENCY DEPARTMENT AT [facility] Provider Note   CSN: [id number] Arrival date & time: 07/28/24  1102     Patient presents with: Loss of Consciousness   Stacy Valencia is a 89 y.o. female.   HPI 89 year old female presents with syncope.  The patient was trying to get up off the toilet and felt weak and slumped down.  States she did not hit her head.  She states she did not pass out but EMS reported the patient did fully lose consciousness.  She denies headache.  She feels all over weak.  Denies any other complaint such as vomiting, diarrhea, chest pain, etc.  She was given about 500 cc of IV fluids as she was tachycardic and hypotensive with EMS.  Prior to Admission medications   Medication Sig Start Date End Date Taking? Authorizing Provider  acetaminophen  (TYLENOL ) 500 MG tablet Take 500 mg by mouth every 6 (six) hours as needed for mild pain (pain score 1-3) or headache.   Yes [provider]  calcium  carbonate (TUMS - DOSED IN MG ELEMENTAL CALCIUM ) 500 MG chewable tablet Chew 2 tablets (400 mg of elemental calcium  total) by mouth 4 (four) times daily as needed for indigestion or heartburn. Patient taking differently: Chew 1-2 tablets by mouth 4 (four) times daily as needed for indigestion or heartburn. 06/10/24  Yes Laurence Locus, DO  Cholecalciferol (VITAMIN D3) 1000 units CAPS Take 1,000-2,000 Units by mouth in the morning.   Yes [provider]  metoprolol  tartrate (LOPRESSOR ) 25 MG tablet Take 0.5 tablets (12.5 mg total) by mouth 2 (two) times daily. 08/09/20  Yes Angiulli, Toribio JINNY, PA-C  midodrine  (PROAMATINE ) 5 MG tablet Take 1 tablet (5 mg total) by mouth in the morning and at bedtime. Take 5 mg at 8AM and 5mg  at [facility] Patient taking differently: Take 5 mg by mouth See admin instructions. Take 1 tablet (5mg ) by mouth twice daily, in the morning and early afternoon. 03/21/24  Yes Turner, Wilbert SAUNDERS, MD  omeprazole  (PRILOSEC  OTC) 20 MG tablet Take 1  tablet (20 mg total) by mouth in the morning and at bedtime. Patient taking differently: Take 20 mg by mouth daily before breakfast. 06/10/24  Yes Laurence Locus, DO  thiamine  (VITAMIN B-1) 100 MG tablet Take 1 tablet (100 mg total) by mouth daily. 06/11/24  Yes Laurence Locus, DO  XARELTO  20 MG TABS tablet TAKE 1 TABLET BY MOUTH EVERY DAY Patient taking differently: Take 20 mg by mouth See admin instructions. Take 1 tablet (20mg ) by mouth every day at 1830. 04/15/22  Yes Shadad, Firas N, MD  hydrOXYzine  (ATARAX ) 25 MG tablet Take 1 tablet (25 mg total) by mouth 3 (three) times daily as needed for anxiety. Patient not taking: Reported on 07/28/2024 06/10/24   Laurence Locus, DO  melatonin 10 MG TABS Take 10 mg by mouth at bedtime. Patient not taking: Reported on 07/28/2024 06/10/24   Laurence Locus, DO  mirtazapine  (REMERON ) 15 MG tablet Take 1 tablet (15 mg total) by mouth at bedtime. Patient not taking: Reported on 07/28/2024 06/10/24 09/08/24  Laurence Locus, DO  Nystatin (GERHARDT'S BUTT CREAM) CREA Apply 1 Application topically 2 (two) times daily. To buttocks Patient not taking: Reported on 07/28/2024 06/10/24   Laurence Locus, DO  ondansetron  (ZOFRAN ) 4 MG tablet Take 1 tablet (4 mg total) by mouth every 6 (six) hours as needed for nausea. Patient not taking: Reported on 07/28/2024 06/10/24   Laurence Locus, DO    Allergies: Sulfa  antibiotics    Review of Systems  Constitutional:  Negative for fever.  Respiratory:  Negative for shortness of breath.   Cardiovascular:  Negative for chest pain.  Gastrointestinal:  Negative for abdominal pain.  Neurological:  Positive for weakness. Negative for headaches.    Updated Vital Signs BP 128/61   Pulse 79   Temp 99.3 F (37.4 C) (Rectal)   Resp (!) 21   Ht 5' 5 (1.651 m)   Wt 59 kg   SpO2 93%   BMI 21.64 kg/m   Physical Exam Vitals and nursing note reviewed.  Constitutional:      General: She is not in acute distress.    Appearance: She is well-developed. She is not  ill-appearing.  HENT:     Head: Normocephalic and atraumatic.     Mouth/Throat:     Mouth: Mucous membranes are dry.  Eyes:     Pupils: Pupils are equal, round, and reactive to light.  Cardiovascular:     Rate and Rhythm: Normal rate and regular rhythm.     Heart sounds: Normal heart sounds.  Pulmonary:     Effort: Pulmonary effort is normal.     Breath sounds: Normal breath sounds.  Abdominal:     General: There is no distension.     Palpations: Abdomen is soft.     Tenderness: There is no abdominal tenderness.  Skin:    General: Skin is warm and dry.  Neurological:     Mental Status: She is alert.     Comments: Awake, alert, oriented to person, place, situation. Disoriented to time. No facial droop or slurred speech. Equal strength and sensation in all 4 extremities.     (all labs ordered are listed, but only abnormal results are displayed) Labs Reviewed  CBC WITH DIFFERENTIAL/PLATELET - Abnormal; Notable for the following components:      Result Value   WBC 15.7 (*)    MCV 105.1 (*)    MCH 34.3 (*)    Neutro Abs 13.6 (*)    Abs Immature Granulocytes 0.11 (*)    All other components within normal limits  COMPREHENSIVE METABOLIC PANEL WITH GFR - Abnormal; Notable for the following components:   Glucose, Bld 158 (*)    Creatinine, Ser 1.06 (*)    Total Protein 6.4 (*)    Albumin 3.1 (*)    GFR, Estimated 50 (*)    All other components within normal limits  URINALYSIS, W/ REFLEX TO CULTURE (INFECTION SUSPECTED) - Abnormal; Notable for the following components:   APPearance HAZY (*)    Bacteria, UA RARE (*)    All other components within normal limits  LACTIC ACID, PLASMA - Abnormal; Notable for the following components:   Lactic Acid, Venous 4.7 (*)    All other components within normal limits  I-STAT CG4 LACTIC ACID, ED - Abnormal; Notable for the following components:   Lactic Acid, Venous 4.4 (*)    All other components within normal limits  I-STAT CHEM 8, ED -  Abnormal; Notable for the following components:   Glucose, Bld 152 (*)    TCO2 20 (*)    Hemoglobin 15.3 (*)    All other components within normal limits  I-STAT CG4 LACTIC ACID, ED - Abnormal; Notable for the following components:   Lactic Acid, Venous 4.7 (*)    All other components within normal limits  CULTURE, BLOOD (ROUTINE X 2)  CULTURE, BLOOD (ROUTINE X 2)  URINE CULTURE  LACTIC ACID, PLASMA  C-REACTIVE PROTEIN  D-DIMER, QUANTITATIVE  SEDIMENTATION RATE  TROPONIN I (HIGH SENSITIVITY)  TROPONIN I (HIGH SENSITIVITY)    EKG: EKG Interpretation Date/Time:  Thursday July 28 2024 11:17:27 EDT Ventricular Rate:  87 PR Interval:    QRS Duration:  79 QT Interval:  511 QTC Calculation: 615 R Axis:   -8  Text Interpretation: Atrial fibrillation Abnormal R-wave progression, early transition Borderline abnrm T, anterolateral leads Prolonged QT interval Confirmed by Freddi Hamilton [phone]) on 07/28/2024 11:36:48 AM  Radiology: CT Head Wo Contrast Result Date: 07/28/2024 EXAM: CT HEAD WITHOUT CONTRAST 07/28/2024 12:56:00 PM TECHNIQUE: CT of the head was performed without the administration of intravenous contrast. Automated exposure control, iterative reconstruction, and/or weight based adjustment of the mA/kV was utilized to reduce the radiation dose to as low as reasonably achievable. COMPARISON: CT of the head dated 06/06/2024. CLINICAL HISTORY: Head trauma, minor (Age >= 65y). AMS FINDINGS: BRAIN AND VENTRICLES: No acute hemorrhage. Gray-white differentiation is preserved. No hydrocephalus. No extra-axial collection. No mass effect or midline shift. Moderate amount of generalized cerebral and cerebellar volume loss present. Moderate periventricular white matter disease. ORBITS: The patient is status post bilateral lens replacement. SINUSES: No acute abnormality. SOFT TISSUES AND SKULL: No acute soft tissue abnormality. No skull fracture. IMPRESSION: 1. No acute intracranial abnormality.  2. Moderate amount of generalized cerebral and cerebellar volume loss. 3. Moderate periventricular white matter disease. Electronically signed by: evalene coho 07/28/2024 01:01 PM EDT RP Workstation: [id number]   DG Chest Portable 1 View Result Date: 07/28/2024 CLINICAL DATA:  Recent syncopal episode EXAM: PORTABLE CHEST 1 VIEW COMPARISON:  06/06/2024 CT FINDINGS: Cardiac shadow is within normal limits. Aortic calcifications are seen. Chronic scarring in the bases is noted right slightly greater than left. No focal infiltrate or effusion is seen. No bony abnormality is noted. IMPRESSION: Chronic scarring in the bases. Electronically Signed   By: Oneil Devonshire M.D.   On: 07/28/2024 12:11     .Critical Care  Performed by: Freddi Hamilton, MD Authorized by: Freddi Hamilton, MD   Critical care provider statement:    Critical care time (minutes):  35   Critical care time was exclusive of:  Separately billable procedures and treating other patients   Critical care was time spent personally by me on the following activities:  Development of treatment plan with patient or surrogate, discussions with consultants, evaluation of patient's response to treatment, examination of patient, ordering and review of laboratory studies, ordering and review of radiographic studies, ordering and performing treatments and interventions, pulse oximetry, re-evaluation of patient's condition and review of old charts    Medications Ordered in the ED  lactated ringers  infusion ( Intravenous New Bag/Given 07/28/24 1453)  lactated ringers  bolus 1,000 mL (0 mLs Intravenous Stopped 07/28/24 1254)  vancomycin  (VANCOCIN ) IVPB 1000 mg/200 mL premix (0 mg Intravenous Stopped 07/28/24 1319)  meropenem  (MERREM ) 1 g in sodium chloride  0.9 % 100 mL IVPB (0 g Intravenous Stopped 07/28/24 1238)  lactated ringers  bolus 1,000 mL (0 mLs Intravenous Stopped 07/28/24 1448)    Clinical Course as of 07/28/24 1457  Thu Jul 28, 2024  1152 Patient's  lactate is 4.4 and her white count is 15.  With her having hypotension, tachycardia, and a near fever at 99.3, will activate her as a code sepsis given recent UTI.  Last UTI was ESBL, consulting with pharmacy will start her on vancomycin  and meropenem  as we work her up. [SG]    Clinical Course User Index [SG] Freddi Hamilton, MD  Medical Decision Making Amount and/or Complexity of Data Reviewed External Data Reviewed: notes. Labs: ordered.    Details: Leukocytosis, elevated lactate Radiology: ordered and independent interpretation performed.    Details: No pneumonia ECG/medicine tests: ordered and independent interpretation performed.    Details: A-fib  Risk Prescription drug management. Decision regarding hospitalization.   Patient presents with hypotension and syncope.  She has soft blood pressures but no outright hypotension here.  As above, concern for sepsis and was started on IV fluids and given broad IV antibiotics.  So far the workup does not show an obvious source.  Will need to follow cultures.  Clinically she is otherwise appearing well with stable vitals besides some mild tachypnea.  No meningismus on exam.  She does not seem altered from her baseline.  For now we will continue antibiotics and will need continued supportive care in the hospital.  No abdominal pain.  Discussed with Dr. Claudene, who will admit.     Final diagnoses:  Severe sepsis [facility])    ED Discharge Orders     None          Freddi Hamilton, MD 07/28/24 [phone]

## 2024-07-28 NOTE — Sepsis Progress Note (Signed)
 Sepsis protocol monitored by eLink ?

## 2024-07-28 NOTE — H&P (Addendum)
 History and Physical    Patient: Stacy Valencia FMW:[id number] DOB: 1933/04/14 DOA: 07/28/2024 DOS: the patient was seen and examined on 07/28/2024 PCP: Kip Righter, MD  Patient coming from: Home  Chief Complaint:  Chief Complaint  Patient presents with   Loss of Consciousness   [name] with medical history significant of atrial fibrillation, diastolic CHF, and DVT/PE on chronic anticoagulation presents with weakness after slumping in the bathroom.  History is initially obtained from the patient reports  She slumped down in the bathroom, feeling weak and tired afterward. She did not fall or lose consciousness during the episode.    She has a history of low blood pressure and is on medication to manage it. She did not take her medication today as her husband manages them.  No recent illness, chest pain, nausea, vomiting, or diarrhea. She engages in activities such as crossword puzzles and denies any memory issues.  Review of records note patient had last been hospitalized 6/15-6/24 urinary tract infection due to ESBL Klebsiella pneumonia no treated with IV meropenem  prior to being switched to ciprofloxacin  p.o to complete course.  En route with EMS patient's blood pressure was initially noted to be as low as 70/42 with improvement after receiving 500 mL of IV fluid bolus with EMS.  Over the phone the patient's husband provides more information and notes that she had not eaten much for dinner last night and had woken up her husband at 3 AM and told him that she was cold and he gave her a blanket.  She also complained of feeling queasy on her stomach for which she placed a bucket of at her bed just in case she had vomited.  Her husband noted that she had vomited a little overnight on herself.  Husband got her up at 9 AM and noted that she was significantly unstable on her feet for which she used a gait belt as she used a walker to go to the bathroom.  After  getting to the bathroom she slumped over and he reports that she did lose consciousness for few minutes.  In the emergency department patient was noted to be afebrile with pulse elevated up to 107, respirations 15-21, and all other vital signs maintained.  Labs revealed WBC 15.7, hemoglobin 15.3, lactic acid 4.4-> 4.7, and high-sensitivity troponins negative x 2.  Urinalysis noted rare bacteria without any significant abnormality.  Chest x-ray chronic scarring in the bases.  CT scan of the head did not reveal any acute abnormality.  Patient was given 2 L of lactated Ringer 's and placed on empiric antibiotics of meropenem  and vancomycin .  Review of Systems: As mentioned in the history of present illness. All other systems reviewed and are negative. Past Medical History:  Diagnosis Date   Acute cholecystitis 04/11/2024   Adjustment disorder with anxious mood    Atrial fibrillation (HCC) 11/19/2023   Atrial fibrillation with RVR (HCC) 11/20/2023   Choledocholithiasis 04/10/2024   DVT (deep venous thrombosis) (HCC) 01/07/2013   Femur fracture, left (HCC) 07/25/2020   GERD (gastroesophageal reflux disease)    Hip fracture (HCC) 07/22/2020   PE (pulmonary embolism) 12/23/2003   Pulmonary embolism (HCC) 01/05/2013   Rhabdomyolysis 11/19/2023   Past Surgical History:  Procedure Laterality Date   ABDOMINAL HYSTERECTOMY     APPENDECTOMY     arthroscopic shoulder surgery Right 10/28/2016   decompression of SAD/DCR   BILIARY DILATION  04/13/2024   Procedure: DILATION, STRICTURE, BILE  DUCT;  Surgeon: Charlanne Groom, MD;  Location[facility] ENDOSCOPY;  Service: Gastroenterology;;   ERCP N/A 04/13/2024   Procedure: ERCP, WITH INTERVENTION IF INDICATED;  Surgeon: Charlanne Groom, MD;  Location: [facility] ENDOSCOPY;  Service: Gastroenterology;  Laterality: N/A;   INTRAMEDULLARY (IM) NAIL INTERTROCHANTERIC Left 07/23/2020   Procedure: INTRAMEDULLARY (IM) NAIL INTERTROCHANTRIC HIP;  Surgeon: Fidel Rogue, MD;  Location:  [facility] OR;  Service: Orthopedics;  Laterality: Left;   Social History:  reports that she has never smoked. She has never used smokeless tobacco. She reports that she does not drink alcohol and does not use drugs.  Allergies  Allergen Reactions   Sulfa Antibiotics Nausea And Vomiting    Reaction only occurs with tablet formulations    Family History  Problem Relation Age of Onset   Pulmonary embolism Mother    Other Father        Head trauma   Emphysema Sister    Alcoholism Sister    Down syndrome Brother     Prior to Admission medications   Medication Sig Start Date End Date Taking? Authorizing Provider  acetaminophen  (TYLENOL ) 500 MG tablet Take 500 mg by mouth every 6 (six) hours as needed for mild pain (pain score 1-3).    [provider]  calcium  carbonate (TUMS - DOSED IN MG ELEMENTAL CALCIUM ) 500 MG chewable tablet Chew 2 tablets (400 mg of elemental calcium  total) by mouth 4 (four) times daily as needed for indigestion or heartburn. 06/10/24   Laurence Locus, DO  Cholecalciferol (VITAMIN D3) 1000 units CAPS Take 1,000 Units by mouth daily.    [provider]  hydrOXYzine  (ATARAX ) 25 MG tablet Take 1 tablet (25 mg total) by mouth 3 (three) times daily as needed for anxiety. 06/10/24   Laurence Locus, DO  melatonin 10 MG TABS Take 10 mg by mouth at bedtime. 06/10/24   Laurence Locus, DO  metoprolol  tartrate (LOPRESSOR ) 25 MG tablet Take 0.5 tablets (12.5 mg total) by mouth 2 (two) times daily. Patient taking differently: Take 12.5 mg by mouth See admin instructions. Take 1/2 tablet (12.5mg ) by mouth twice daily, in the morning and early afternoon. 08/09/20   Angiulli, Toribio PARAS, PA-C  midodrine  (PROAMATINE ) 5 MG tablet Take 1 tablet (5 mg total) by mouth in the morning and at bedtime. Take 5 mg at 8AM and 5mg  at [facility] Patient taking differently: Take 5 mg by mouth See admin instructions. Take 1 tablet (5mg ) by mouth twice daily, in the morning and early afternoon. 03/21/24   Shlomo Wilbert SAUNDERS, MD  mirtazapine  (REMERON ) 15 MG tablet Take 1 tablet (15 mg total) by mouth at bedtime. 06/10/24 09/08/24  Laurence Locus, DO  Nystatin (GERHARDT'S BUTT CREAM) CREA Apply 1 Application topically 2 (two) times daily. To buttocks 06/10/24   Laurence Locus, DO  omeprazole  (PRILOSEC  OTC) 20 MG tablet Take 1 tablet (20 mg total) by mouth in the morning and at bedtime. 06/10/24   Laurence Locus, DO  ondansetron  (ZOFRAN ) 4 MG tablet Take 1 tablet (4 mg total) by mouth every 6 (six) hours as needed for nausea. 06/10/24   Laurence Locus, DO  thiamine  (VITAMIN B-1) 100 MG tablet Take 1 tablet (100 mg total) by mouth daily. 06/11/24   Laurence Locus, DO  XARELTO  20 MG TABS tablet TAKE 1 TABLET BY MOUTH EVERY DAY Patient taking differently: Take 20 mg by mouth See admin instructions. Take 1 tablet (20mg ) by mouth every day at 1830. 04/15/22   Shadad, Firas N, MD  Physical Exam: Vitals:   07/28/24 1140 07/28/24 1200 07/28/24 1330 07/28/24 1415  BP: (!) 101/58 118/76 111/72 128/61  Pulse: (!) 103 90 87 79  Resp: 16 20 15  (!) 21  Temp:      TempSrc:      SpO2: 94% 99% 97% 93%  Weight:      Height:       Constitutional: Elderly female appears to be chronically ill Eyes: PERRL, lids and conjunctivae normal ENMT: Mucous membranes are dry.  Dentures not in place. Neck: normal, supple,  Respiratory: clear to auscultation bilaterally, no wheezing, no crackles. Normal respiratory effort. No accessory muscle use.  Cardiovascular: Regular irregular no extremity edema.   Abdomen: no tenderness, no masses palpated.   Bowel sounds positive.  Musculoskeletal: no clubbing / cyanosis. No joint deformity upper and lower extremities. Good ROM, no contractures. Normal muscle tone.  Skin: no rashes, lesions, ulcers. No induration Neurologic: CN 2-12 grossly intact.  Patient/5 in all 4 extremities. Psychiatric: Normal judgment and insight. Alert and oriented x 3. Normal mood.   Data Reviewed:  Patient intermittent atrial  fibrillation with heart rates 87 bpm.  Reviewed labs, imaging, and pertinent records as documented.  Assessment and Plan:  SIRS Lactic acidosis Acute.  Patient presented with tachycardia, tachypnea and lactic acidosis meeting SIRS criteria.  Initial lactic acid elevated at 4.4->4.7.  Urinalysis did not show significant signs for infection.  Cultures were obtained and the patient was started on empiric antibiotics of meropenem  and vancomycin . - Admit to progressive bed - Follow-up blood cultures blood - Check influenza, COVID-19, and RSV screening - Check ESR and CRP - Continue empiric antibiotics of meropenem  and vancomycin . De-escalate when medically appropriate  Syncope Transient hypotension Patient presents after having a syncopal episode with reported loss of consciousness for couple of minutes by husband.  Blood pressures with EMS noted to be as low as 70/42 blood pressures improved after IV fluid bolus.  Records note patient is chronically on midodrine ..  Thought to be in the setting of dehydration with reports of nausea and vomiting. - Check D-dimer and echocardiogram - Goal MAP greater than 65 - Held metoprolol  - Continue midodrine   Nausea and vomiting Patient's husband notes that she has had poor p.o. intake and had episodes of vomiting overnight.  Question of the possibility of gastroenteritis. - Aspiration precautions with elevation head of bed  - Tigan  as needed for nausea and vomiting  Renal insufficiency Creatinine noted to be elevated at 1.06 with BUN 12.  Baseline creatinine previously noted to be around 0.5-0.8. - Continue IV fluids as noted - Recheck kidney function in a.m.  Paroxysmal atrial fibrillation History of DVT/PE on chronic anticoagulation Patient appears to be in atrial fibrillation but currently rate controlled.  She did take Xarelto  yesterday evening.  Patient had a saddle PE noted last in 2014. - Held metoprolol  due to hypotension.  Generalized  weakness Patient normally gets around with use of a rolling walker.  In the head did not reveal any acute abnormality. - PT/OT to eval and treat  Abnormal thyroid  function study Previously TSH noted to be 9.764 with free T4-1.37 when checked on 6/17.  Thyroid  dysfunction could be the cause for patient's persistent thyroid  presentation. - Recheck TSH and free T4  GERD - Continue omeprazole   DVT prophylaxis: Lovenox  Advance Care Planning:   Code Status: Full Code    Consults: None  Family Communication: Husband updated over the phone  Severity of Illness: The appropriate patient status  for this patient is OBSERVATION. Observation status is judged to be reasonable and necessary in order to provide the required intensity of service to ensure the patient's safety. The patient's presenting symptoms, physical exam findings, and initial radiographic and laboratory data in the context of their medical condition is felt to place them at decreased risk for further clinical deterioration. Furthermore, it is anticipated that the patient will be medically stable for discharge from the hospital within 2 midnights of admission.   Author: Maximino DELENA Sharps, MD 07/28/2024 2:28 PM  For on call review [email or web address].

## 2024-07-28 NOTE — Progress Notes (Signed)
 ED Pharmacy Antibiotic Sign Off An antibiotic consult was received from an ED provider for merrem  per pharmacy dosing for sepsis w/ recent history of ESBL Kleb Pneumo in the urine. A chart review was completed to assess appropriateness.   The following one time order(s) were placed:  Meropenem  1g IV x1  Further antibiotic and/or antibiotic pharmacy consults should be ordered by the admitting provider if indicated.   Thank you for allowing pharmacy to be a part of this patient's care.   Sharyne LILLETTE Glatter, [facility]  Clinical Pharmacist 07/28/24 11:54 AM

## 2024-07-28 NOTE — ED Notes (Signed)
 5W notified of patient coming up

## 2024-07-28 NOTE — ED Triage Notes (Signed)
 Pt to ED via EMS with c/o syncopal episode this am. EMS reports pt was having bowel movement prior to syncopal episode. Pt did not fall off toilet/hit head. Pt BP 70/42 initially for EMS. Pt given 500ml NS en route. Pt A&Ox4. Pt recently in hospital for UTI.   EMS vitals:  BP 70/42 RR 30 HR 106 CBG 164 Temp 97.66F

## 2024-07-28 NOTE — ED Notes (Signed)
 Performed orthostatics, patient shaky and lightheaded when sitting. Did not feel comfortable standing patient.

## 2024-07-29 ENCOUNTER — Observation Stay (HOSPITAL_COMMUNITY)

## 2024-07-29 DIAGNOSIS — Z86711 Personal history of pulmonary embolism: Secondary | ICD-10-CM | POA: Diagnosis not present

## 2024-07-29 DIAGNOSIS — I5032 Chronic diastolic (congestive) heart failure: Secondary | ICD-10-CM | POA: Diagnosis present

## 2024-07-29 DIAGNOSIS — E872 Acidosis, unspecified: Secondary | ICD-10-CM | POA: Diagnosis present

## 2024-07-29 DIAGNOSIS — I951 Orthostatic hypotension: Secondary | ICD-10-CM | POA: Diagnosis present

## 2024-07-29 DIAGNOSIS — Z79899 Other long term (current) drug therapy: Secondary | ICD-10-CM | POA: Diagnosis not present

## 2024-07-29 DIAGNOSIS — A084 Viral intestinal infection, unspecified: Secondary | ICD-10-CM | POA: Diagnosis present

## 2024-07-29 DIAGNOSIS — R55 Syncope and collapse: Secondary | ICD-10-CM | POA: Diagnosis not present

## 2024-07-29 DIAGNOSIS — Z86718 Personal history of other venous thrombosis and embolism: Secondary | ICD-10-CM | POA: Diagnosis not present

## 2024-07-29 DIAGNOSIS — K219 Gastro-esophageal reflux disease without esophagitis: Secondary | ICD-10-CM | POA: Diagnosis present

## 2024-07-29 DIAGNOSIS — R6510 Systemic inflammatory response syndrome (SIRS) of non-infectious origin without acute organ dysfunction: Secondary | ICD-10-CM | POA: Diagnosis present

## 2024-07-29 DIAGNOSIS — N179 Acute kidney failure, unspecified: Secondary | ICD-10-CM | POA: Diagnosis present

## 2024-07-29 DIAGNOSIS — Z1152 Encounter for screening for COVID-19: Secondary | ICD-10-CM | POA: Diagnosis not present

## 2024-07-29 DIAGNOSIS — I959 Hypotension, unspecified: Secondary | ICD-10-CM | POA: Diagnosis not present

## 2024-07-29 DIAGNOSIS — Z7901 Long term (current) use of anticoagulants: Secondary | ICD-10-CM | POA: Diagnosis not present

## 2024-07-29 DIAGNOSIS — Z882 Allergy status to sulfonamides status: Secondary | ICD-10-CM | POA: Diagnosis not present

## 2024-07-29 DIAGNOSIS — Z90710 Acquired absence of both cervix and uterus: Secondary | ICD-10-CM | POA: Diagnosis not present

## 2024-07-29 DIAGNOSIS — Z9049 Acquired absence of other specified parts of digestive tract: Secondary | ICD-10-CM | POA: Diagnosis not present

## 2024-07-29 DIAGNOSIS — E039 Hypothyroidism, unspecified: Secondary | ICD-10-CM | POA: Diagnosis present

## 2024-07-29 DIAGNOSIS — I4819 Other persistent atrial fibrillation: Secondary | ICD-10-CM | POA: Diagnosis present

## 2024-07-29 LAB — BASIC METABOLIC PANEL WITH GFR
Anion gap: 8 (ref 5–15)
BUN: 11 mg/dL (ref 8–23)
CO2: 22 mmol/L (ref 22–32)
Calcium: 8.3 mg/dL — ABNORMAL LOW (ref 8.9–10.3)
Chloride: 108 mmol/L (ref 98–111)
Creatinine, Ser: 0.76 mg/dL (ref 0.44–1.00)
GFR, Estimated: >60 mL/min (ref >60)
Glucose, Bld: 84 mg/dL (ref 70–99)
Potassium: 3.7 mmol/L (ref 3.5–5.1)
Sodium: 138 mmol/L (ref 135–145)

## 2024-07-29 LAB — CBC
HCT: 31.2 % — ABNORMAL LOW (ref 36.0–46.0)
Hemoglobin: 10.4 g/dL — ABNORMAL LOW (ref 12.0–15.0)
MCH: 34.6 pg — ABNORMAL HIGH (ref 26.0–34.0)
MCHC: 33.3 g/dL (ref 30.0–36.0)
MCV: 103.7 fL — ABNORMAL HIGH (ref 80.0–100.0)
Platelets: 163 K/uL (ref 150–400)
RBC: 3.01 MIL/uL — ABNORMAL LOW (ref 3.87–5.11)
RDW: 14.6 % (ref 11.5–15.5)
WBC: 9.5 K/uL (ref 4.0–10.5)
nRBC: 0 % (ref 0.0–0.2)

## 2024-07-29 LAB — LACTIC ACID, PLASMA: Lactic Acid, Venous: 2.3 mmol/L (ref 0.5–1.9)

## 2024-07-29 MED ORDER — THIAMINE MONONITRATE 100 MG PO TABS
100.0000 mg | ORAL_TABLET | Freq: Every day | ORAL | Status: DC
Start: 2024-07-29 — End: 2024-08-03
  Administered 2024-07-29 – 2024-08-03 (×9): 100 mg via ORAL
  Filled 2024-07-29 (×6): qty 1

## 2024-07-29 MED ORDER — MIDODRINE HCL 5 MG PO TABS
10.0000 mg | ORAL_TABLET | ORAL | Status: DC
  Administered 2024-07-29 – 2024-08-03 (×16): 10 mg via ORAL
  Filled 2024-07-29 (×11): qty 2

## 2024-07-29 MED ORDER — SODIUM CHLORIDE 0.9 % IV SOLN: INTRAVENOUS | Status: AC

## 2024-07-29 MED ORDER — LACTATED RINGERS IV BOLUS
250.0000 mL | Freq: Once | INTRAVENOUS | Status: AC
  Administered 2024-07-29: 250 mL via INTRAVENOUS

## 2024-07-29 NOTE — Evaluation (Signed)
 Physical Therapy Evaluation Patient Details Name: Stacy Valencia MRN: [id number] DOB: December 27, 1932 Today's Date: 07/29/2024  History of Present Illness  Patient is a 89 y.o.  female presents with weakness after slumping in the bathroom. Past medical history significant of atrial fibrillation, diastolic CHF, and DVT/PE on chronic anticoagulation.  Clinical Impression  Pt presents with admitting diagnosis above. Pt today was able to stand with +2 Min A in order to check orthostatics however pt noted to be very anxious with mobility and immediately requested to sit down. BP: 120/60 supine, BP: 148/76 seated, BP: 114/56 standing. PTA pt reports that ambulated household distances with help from her husband. Patient will benefit from continued inpatient follow up therapy, <3 hours/day although this pt is known to this therapist from previous admission and will likely decline SNF. PT will continue to follow.         If plan is discharge home, recommend the following: Two people to help with walking and/or transfers;A lot of help with bathing/dressing/bathroom;Assistance with cooking/housework;Direct supervision/assist for medications management;Assist for transportation;Help with stairs or ramp for entrance;Supervision due to cognitive status   Can travel by private vehicle   No    Equipment Recommendations Hospital bed;Hoyer lift  Recommendations for Other Services       Functional Status Assessment Patient has had a recent decline in their functional status and demonstrates the ability to make significant improvements in function in a reasonable and predictable amount of time.     Precautions / Restrictions Precautions Precautions: Fall Recall of Precautions/Restrictions: Impaired Restrictions Weight Bearing Restrictions Per Provider Order: No      Mobility  Bed Mobility Overal bed mobility: Needs Assistance Bed Mobility: Rolling, Supine to Sit, Sit to Supine Rolling: Modified  independent (Device/Increase time)   Supine to sit: Mod assist Sit to supine: Mod assist   General bed mobility comments: Mod I rolling. Mod A for BLE management.    Transfers Overall transfer level: Needs assistance Equipment used: Rolling walker (2 wheels) Transfers: Sit to/from Stand Sit to Stand: +2 physical assistance, Min assist           General transfer comment: +2 Min A to stand. Pt noted with heavy posterior lean while taking BP and was immediately requesting to sit.    Ambulation/Gait               General Gait Details: pt declined  Careers information officer     Tilt Bed    Modified Rankin (Stroke Patients Only)       Balance Overall balance assessment: Needs assistance Sitting-balance support: Bilateral upper extremity supported, Feet supported Sitting balance-Leahy Scale: Good     Standing balance support: Bilateral upper extremity supported Standing balance-Leahy Scale: Poor Standing balance comment: Heavy posterior lean                             Pertinent Vitals/Pain Pain Assessment Pain Assessment: No/denies pain    Home Living Family/patient expects to be discharged to:: Private residence Living Arrangements: Spouse/significant other Available Help at Discharge: Family;Available 24 hours/day Type of Home: House Home Access: Ramped entrance       Home Layout: One level Home Equipment: Agricultural consultant (2 wheels);Wheelchair - manual;Shower seat;BSC/3in1;Grab bars - tub/shower;Hand held Sales executive;Toilet riser Additional Comments: Pt lives at home with husband, has lift chair for standing.    Prior Function Prior Level  of Function : Needs assist;History of Falls (last six months)             Mobility Comments: spouse able to assist at home until recently pt not tolerating mobility or allowing up to [facility] ADLs Comments: husband helps with all ADLs, bathing, transfers      Extremity/Trunk Assessment   Upper Extremity Assessment Upper Extremity Assessment: Generalized weakness    Lower Extremity Assessment Lower Extremity Assessment: Generalized weakness    Cervical / Trunk Assessment Cervical / Trunk Assessment: Kyphotic  Communication   Communication Communication: Impaired Factors Affecting Communication: Hearing impaired    Cognition Arousal: Alert Behavior During Therapy: Anxious   PT - Cognitive impairments: Awareness                       PT - Cognition Comments: Pt appeared to be A&Ox3 however stated that she turns 89 this weekend despite her birthday being in [birth year]. Following commands: Intact       Cueing Cueing Techniques: Verbal cues, Tactile cues     General Comments General comments (skin integrity, edema, etc.): BP: 120/60 supine, BP: 148/76 seated, BP: 114/56 standing    Exercises     Assessment/Plan    PT Assessment Patient needs continued PT services  PT Problem List Decreased strength;Decreased range of motion;Decreased activity tolerance;Decreased balance;Decreased mobility;Decreased coordination;Decreased knowledge of use of DME;Decreased safety awareness;Decreased knowledge of precautions;Cardiopulmonary status limiting activity;Decreased cognition       PT Treatment Interventions DME instruction;Gait training;Stair training;Functional mobility training;Therapeutic activities;Therapeutic exercise;Balance training;Neuromuscular re-education;Cognitive remediation;Patient/family education    PT Goals (Current goals can be found in the Care Plan section)  Acute Rehab PT Goals Patient Stated Goal: to get back to my hubby PT Goal Formulation: With patient Time For Goal Achievement: 08/12/24 Potential to Achieve Goals: Poor    Frequency Min 2X/week     Co-evaluation               AM-PAC PT 6 Clicks Mobility  Outcome Measure Help needed turning from your back to your side while in a flat bed  without using bedrails?: None Help needed moving from lying on your back to sitting on the side of a flat bed without using bedrails?: A Lot Help needed moving to and from a bed to a chair (including a wheelchair)?: A Lot Help needed standing up from a chair using your arms (e.g., wheelchair or bedside chair)?: A Lot Help needed to walk in hospital room?: Total Help needed climbing 3-5 steps with a railing? : Total 6 Click Score: 12    End of Session Equipment Utilized During Treatment: Gait belt Activity Tolerance: Patient limited by fatigue;Other (comment) (Limited by anxiety) Patient left: in bed;with call bell/phone within reach;with bed alarm set Nurse Communication: Mobility status PT Visit Diagnosis: Other abnormalities of gait and mobility (R26.89)    Time: 9045-8970 PT Time Calculation (min) (ACUTE ONLY): 35 min   Charges:   PT Evaluation $PT Eval Moderate Complexity: 1 Mod   PT General Charges $$ ACUTE PT VISIT: 1 Visit         Sueellen NOVAK, PT, DPT Acute Rehab Services [phone]   Jelissa Espiritu 07/29/2024, 11:49 AM

## 2024-07-29 NOTE — Progress Notes (Signed)
   07/28/24 2243  Assess: MEWS Score  Temp 98 F (36.7 C)  BP (!) 74/47  MAP (mmHg) (!) 57  Pulse Rate 82  ECG Heart Rate 84  Resp (!) 21  Assess: MEWS Score  MEWS Temp 0  MEWS Systolic 2  MEWS Pulse 0  MEWS RR 1  MEWS LOC 0  MEWS Score 3  MEWS Score Color Yellow  Assess: if the MEWS score is Yellow or Red  Were vital signs accurate and taken at a resting state? Yes  Does the patient meet 2 or more of the SIRS criteria? Yes  Does the patient have a confirmed or suspected source of infection? No  MEWS guidelines implemented  Yes, yellow  Treat  MEWS Interventions Considered administering scheduled or prn medications/treatments as ordered  Take Vital Signs  Increase Vital Sign Frequency  Yellow: Q2hr x1, continue Q4hrs until patient remains green for 12hrs  Escalate  MEWS: Escalate Yellow: Discuss with charge nurse and consider notifying provider and/or RRT  Notify: Charge Nurse/RN  Name of Charge Nurse/RN Notified [facility]  Provider Notification  Provider Name/Title MD Franky  Date Provider Notified 07/29/24  Time Provider Notified 2243  Method of Notification Call;Page (secure chat)  Notification Reason Other (Comment) (Low bp 74/47(57))  Provider response See new orders  Date of Provider Response 07/28/24  Time of Provider Response 2246  Assess: SIRS CRITERIA  SIRS Temperature  0  SIRS Respirations  1  SIRS Pulse 0  SIRS WBC 1  SIRS Score Sum  2   Received the order for midodrine  10mg  po and LR 500ml bolus, stat blood test for lactic

## 2024-07-29 NOTE — Evaluation (Signed)
 Occupational Therapy Evaluation Patient Details Name: Stacy Valencia MRN: [id number] DOB: 02-14-1933 Today's Date: 07/29/2024   History of Present Illness   Patient is a 89 y.o.  female presents with weakness after slumping in the bathroom. Past medical history significant of atrial fibrillation, diastolic CHF, and DVT/PE on chronic anticoagulation.     Clinical Impressions Pt reports walking with RW and supervision of her husband. Husband assists with ADL transfers, bathing and dressing and all IADLs. Pt presents with generalized weakness, poor standing balance and decreased activity tolerance. She requires moderate assistance for bed mobility and +2 min assist to stand just long enough for one standing BP(see orthostatics in flow sheet). Pt was not able to ambulate this visit. She needs set up to total assist for ADLs. Pt with bowel incontinence upon standing. Patient will benefit from continued inpatient follow up therapy, <3 hours/day.     If plan is discharge home, recommend the following:   Two people to help with walking and/or transfers;A lot of help with bathing/dressing/bathroom;Assistance with cooking/housework;Assistance with feeding;Direct supervision/assist for medications management;Direct supervision/assist for financial management;Assist for transportation;Help with stairs or ramp for entrance     Functional Status Assessment   Patient has had a recent decline in their functional status and demonstrates the ability to make significant improvements in function in a reasonable and predictable amount of time.     Equipment Recommendations   None recommended by OT     Recommendations for Other Services         Precautions/Restrictions   Precautions Precautions: Fall Recall of Precautions/Restrictions: Impaired Restrictions Weight Bearing Restrictions Per Provider Order: No     Mobility Bed Mobility Overal bed mobility: Needs Assistance Bed Mobility:  Rolling, Supine to Sit, Sit to Supine Rolling: Modified independent (Device/Increase time), Used rails   Supine to sit: Mod assist Sit to supine: Mod assist   General bed mobility comments: assist to raise trunk and for LEs back into bed    Transfers Overall transfer level: Needs assistance Equipment used: Rolling walker (2 wheels) Transfers: Sit to/from Stand Sit to Stand: +2 physical assistance, Min assist, From elevated surface           General transfer comment: +2 Min A to rise and steady, flexed posture. Pt noted with increasing posterior lean while taking BP and was immediately requesting to sit. Only able to take one standing BP.      Balance Overall balance assessment: Needs assistance   Sitting balance-Leahy Scale: Good     Standing balance support: Bilateral upper extremity supported Standing balance-Leahy Scale: Poor                             ADL either performed or assessed with clinical judgement   ADL Overall ADL's : Needs assistance/impaired Eating/Feeding: Independent;Bed level   Grooming: Brushing hair;Sitting;Total assistance   Upper Body Bathing: Moderate assistance;Sitting   Lower Body Bathing: +2 for physical assistance;Total assistance;Sit to/from stand   Upper Body Dressing : Maximal assistance;Bed level   Lower Body Dressing: Total assistance;+2 for physical assistance;Sit to/from stand       Toileting- Architect and Hygiene: Total assistance;Bed level Toileting - Clothing Manipulation Details (indicate cue type and reason): pt with bowel incontinence       General ADL Comments: fatigues quickly in standing with orthostatic BP assessment     Vision Ability to See in Adequate Light: 0 Adequate Patient Visual Report: No change from  baseline       Perception         Praxis         Pertinent Vitals/Pain Pain Assessment Pain Assessment: No/denies pain     Extremity/Trunk Assessment Upper Extremity  Assessment Upper Extremity Assessment: Generalized weakness   Lower Extremity Assessment Lower Extremity Assessment: Defer to PT evaluation   Cervical / Trunk Assessment Cervical / Trunk Assessment: Kyphotic;Other exceptions (weakness)   Communication Communication Communication: Impaired Factors Affecting Communication: Hearing impaired   Cognition Arousal: Alert Behavior During Therapy: WFL for tasks assessed/performed Cognition: Cognition impaired   Orientation impairments: Time Awareness: Intellectual awareness impaired, Online awareness impaired Memory impairment (select all impairments): Short-term memory, Declarative long-term memory, Working Biochemist, clinical functioning impairment (select all impairments): Initiation, Sequencing, Reasoning, Problem solving                   Following commands: Intact       Cueing  General Comments   Cueing Techniques: Verbal cues;Gestural cues  BP: 120/60 supine, BP: 148/76 seated, BP: 114/56 standing   Exercises     Shoulder Instructions      Home Living Family/patient expects to be discharged to:: Private residence Living Arrangements: Spouse/significant other Available Help at Discharge: Family;Available 24 hours/day Type of Home: House Home Access: Ramped entrance     Home Layout: One level     Bathroom Shower/Tub: Producer, television/film/video: Handicapped height Bathroom Accessibility: Yes How Accessible: Accessible via walker Home Equipment: Rolling Walker (2 wheels);Wheelchair - manual;Shower seat;BSC/3in1;Grab bars - tub/shower;Hand held Sales executive;Toilet riser   Additional Comments: Pt lives at home with husband, has lift chair for standing.      Prior Functioning/Environment Prior Level of Function : Needs assist;History of Falls (last six months)             Mobility Comments: report walking with walker and supervision of spouse ADLs Comments: spouse manages IADLs  and helps with bathing and dressing    OT Problem List: Decreased strength;Decreased activity tolerance;Impaired balance (sitting and/or standing);Decreased cognition;Decreased safety awareness;Decreased knowledge of use of DME or AE   OT Treatment/Interventions: Self-care/ADL training;DME and/or AE instruction;Therapeutic activities;Patient/family education;Balance training      OT Goals(Current goals can be found in the care plan section)   Acute Rehab OT Goals OT Goal Formulation: With patient Time For Goal Achievement: 08/12/24 Potential to Achieve Goals: Fair ADL Goals Pt Will Perform Grooming: with min assist;standing (one activity) Pt Will Perform Upper Body Bathing: with min assist;sitting Pt Will Perform Upper Body Dressing: with set-up;sitting Pt Will Transfer to Toilet: with min assist;ambulating;bedside commode Pt Will Perform Toileting - Clothing Manipulation and hygiene: with min assist;sit to/from stand Additional ADL Goal #1: Pt will complete bed mobility with use of rail with supervision in preparation for ADLs.   OT Frequency:  Min 2X/week    Co-evaluation PT/OT/SLP Co-Evaluation/Treatment: Yes Reason for Co-Treatment: For patient/therapist safety   OT goals addressed during session: ADL's and self-care      AM-PAC OT 6 Clicks Daily Activity     Outcome Measure Help from another person eating meals?: None Help from another person taking care of personal grooming?: A Lot Help from another person toileting, which includes using toliet, bedpan, or urinal?: Total Help from another person bathing (including washing, rinsing, drying)?: A Lot Help from another person to put on and taking off regular upper body clothing?: A Lot Help from another person to put on and taking off  regular lower body clothing?: Total 6 Click Score: 12   End of Session Equipment Utilized During Treatment: Gait belt;Rolling walker (2 wheels) Nurse Communication: Mobility status;Other  (comment) (aware of BPs)  Activity Tolerance: Patient limited by fatigue;Treatment limited secondary to medical complications (Comment) (orthostatic hypotension) Patient left: in bed;with call bell/phone within reach;with bed alarm set  OT Visit Diagnosis: Unsteadiness on feet (R26.81);Muscle weakness (generalized) (M62.81);Other symptoms and signs involving cognitive function                Time: 9045-8970 OT Time Calculation (min): 35 min Charges:  OT General Charges $OT Visit: 1 Visit OT Evaluation $OT Eval Moderate Complexity: 1 Mod  Mliss HERO, OTR/L Acute Rehabilitation Services Office: [phone]   Kennth Mliss Helling 07/29/2024, 12:58 PM

## 2024-07-29 NOTE — Plan of Care (Signed)
  Problem: Clinical Measurements: Goal: Ability to maintain clinical measurements within normal limits will improve Outcome: Progressing Goal: Will remain free from infection Outcome: Progressing Goal: Diagnostic test results will improve Outcome: Progressing   Problem: Activity: Goal: Risk for activity intolerance will decrease Outcome: Progressing   Problem: Coping: Goal: Level of anxiety will decrease Outcome: Progressing   Problem: Safety: Goal: Ability to remain free from injury will improve Outcome: Progressing

## 2024-07-29 NOTE — Progress Notes (Addendum)
 PROGRESS NOTE        PATIENT DETAILS Name: Stacy Valencia Age: 89 y.o. Sex: female Date of Birth: 1933-01-07 Admit Date: 07/28/2024 Admitting Physician Maximino DELENA Sharps, MD ERE:Fnmmnt, Beverley, MD  Brief Summary: Patient is a 89 y.o.  female with history of A-fib, HFpEF, VTE, chronic hypotension on midodrine -who had nausea/vomiting for 1-2 days-and had a episode of syncope/presyncope on day of admission associated with hypotension (BP 70/42 per H&P-by EMS).  Patient was brought to the ED and subsequently admitted to the hospitalist service.  Significant events: 8/7>> admit to [facility]  Significant studies: 8/7>>CXR: No PNA 8/7>> CT head: No acute intracranial abnormality.  Significant microbiology data: 8/7>> COVID/influenza/RSV PCR: Negative 8/7>> blood culture: No growth.  Procedures: None  Consults: None  Subjective: Had some intermittent episodes of hypotension overnight requiring IV fluid boluses.  Currently asymptomatic-no dizziness or chest pain or shortness of breath.  No further nausea vomiting or diarrhea since hospitalization.  Per patient-she does not think she passed out-and claims that she just slumped over in the bathroom.  Objective: Vitals: Blood pressure (!) 99/51, pulse 62, temperature (!) 97.4 F (36.3 C), temperature source Oral, resp. rate 20, height 5' 5 (1.651 m), weight 59 kg, SpO2 93%.   Exam: Gen Exam:Alert awake-not in any distress HEENT:atraumatic, normocephalic Chest: B/L clear to auscultation anteriorly CVS:S1S2 regular Abdomen:soft non tender, non distended Extremities:no edema Neurology: Non focal Skin: no rash  Pertinent Labs/Radiology:    Latest Ref Rng & Units 07/29/2024    4:04 AM 07/28/2024   11:45 AM 07/28/2024   11:23 AM  CBC  WBC 4.0 - 10.5 K/uL 9.5   15.7   Hemoglobin 12.0 - 15.0 g/dL 89.5  84.6  85.2   Hematocrit 36.0 - 46.0 % 31.2  45.0  45.1   Platelets 150 - 400 K/uL 163   218     Lab Results   Component Value Date   NA 138 07/29/2024   K 3.7 07/29/2024   CL 108 07/29/2024   CO2 22 07/29/2024      Assessment/Plan: Syncope versus presyncope Unclear if she actually lost consciousness-patient denies losing consciousness-per H&P-husband relates that the patient did lose consciousness for a few minutes. Etiology likely secondary to hypotension due to nausea/vomiting (EMS reports BP 70/42 and route to ED) Telemetry-negative so far Echo pending BP stabilizing-stop IV fluids-continue midodrine  Mobilize with PT/OT/check orthostatics.  Nausea/vomiting Probably viral syndrome Has resolved with supportive care As needed antiemetics.  SIRS Probably secondary to viral gastroenteritis No evidence of UTI on UA Blood cultures negative so far Suspect we can stop antibiotics and monitor.   Chronic hypotension/orthostatic hypotension Chronic issue Likely worsened due to GI illness/nausea/vomiting-some issues with intermittent hypotension overnight-currently volume status stable following IV fluid hydration.  Reduce rate of IV fluid to 75 cc/hour-and continue for a few more hours. Prior a.m. cortisol November-stable at 15.2 On midodrine -will increase dosage to 10 mg 3 times daily. Placed TED stockings/abdominal binder if able/patient cooperates.  Persistent A-fib Continue anticoagulation Metoprolol  on hold due to soft blood pressure.  History of VTE Continue anticoagulation.  Hypothyroidism Synthroid  TSH minimally elevated-stable for continued outpatient monitoring/adjustment of Synthroid  regimen.  GERD PPI  Code status:   Code Status: Full Code   DVT Prophylaxis: rivaroxaban  (XARELTO ) tablet 20 mg    Family Communication: None at bedside   Disposition Plan: Status  is: Observation The patient will require care spanning > 2 midnights and should be moved to inpatient because: Severity of illness   Planned Discharge Destination:Home   Diet: Diet Order              DIET SOFT Room service appropriate? Yes; Fluid consistency: Thin  Diet effective now                     Antimicrobial agents: Anti-infectives (From admission, onward)    Start     Dose/Rate Route Frequency Ordered Stop   07/29/24 1000  vancomycin  (VANCOREADY) IVPB 750 mg/150 mL        750 mg 150 mL/hr over 60 Minutes Intravenous Every 24 hours 07/28/24 1736     07/28/24 1730  meropenem  (MERREM ) 1 g in sodium chloride  0.9 % 100 mL IVPB        1 g 200 mL/hr over 30 Minutes Intravenous Every 12 hours 07/28/24 1711     07/28/24 1200  vancomycin  (VANCOCIN ) IVPB 1000 mg/200 mL premix        1,000 mg 200 mL/hr over 60 Minutes Intravenous  Once 07/28/24 1152 07/28/24 1319   07/28/24 1200  meropenem  (MERREM ) 1 g in sodium chloride  0.9 % 100 mL IVPB  Status:  Discontinued        1 g 200 mL/hr over 30 Minutes Intravenous  Once 07/28/24 1154 07/28/24 1156   07/28/24 1200  meropenem  (MERREM ) 1 g in sodium chloride  0.9 % 100 mL IVPB        1 g 200 mL/hr over 30 Minutes Intravenous STAT 07/28/24 1156 07/28/24 1238        MEDICATIONS: Scheduled Meds:  midodrine   5 mg Oral 2 times per day   pantoprazole   40 mg Oral Daily   rivaroxaban   20 mg Oral q1800   sodium chloride  flush  3 mL Intravenous Q12H   Continuous Infusions:  lactated ringers  125 mL/hr at 07/29/24 0450   meropenem  (MERREM ) IV 1 g (07/29/24 0820)   vancomycin      PRN Meds:.acetaminophen  **OR** acetaminophen , calcium  carbonate, trimethobenzamide    I have personally reviewed following labs and imaging studies  LABORATORY DATA: CBC: Recent Labs  Lab 07/28/24 1123 07/28/24 1145 07/29/24 0404  WBC 15.7*  --  9.5  NEUTROABS 13.6*  --   --   HGB 14.7 15.3* 10.4*  HCT 45.1 45.0 31.2*  MCV 105.1*  --  103.7*  PLT 218  --  163    Basic Metabolic Panel: Recent Labs  Lab 07/28/24 1123 07/28/24 1145 07/29/24 0404  NA 141 142 138  K 4.1 4.0 3.7  CL 106 106 108  CO2 22  --  22  GLUCOSE 158* 152* 84  BUN  12 11 11   CREATININE 1.06* 0.80 0.76  CALCIUM  9.2  --  8.3*    GFR: Estimated Creatinine Clearance: 42.9 mL/min (by C-G formula based on SCr of 0.76 mg/dL).  Liver Function Tests: Recent Labs  Lab 07/28/24 1123  AST 38  ALT 14  ALKPHOS 76  BILITOT 0.9  PROT 6.4*  ALBUMIN 3.1*   No results for input(s): LIPASE, AMYLASE in the last 168 hours. No results for input(s): AMMONIA in the last 168 hours.  Coagulation Profile: No results for input(s): INR, PROTIME in the last 168 hours.  Cardiac Enzymes: No results for input(s): CKTOTAL, CKMB, CKMBINDEX, TROPONINI in the last 168 hours.  BNP (last 3 results) No results for input(s): PROBNP in the last 8760  hours.  Lipid Profile: No results for input(s): CHOL, HDL, LDLCALC, TRIG, CHOLHDL, LDLDIRECT in the last 72 hours.  Thyroid  Function Tests: Recent Labs    07/28/24 1836  TSH 4.612*  FREET4 1.05    Anemia Panel: No results for input(s): VITAMINB12, FOLATE, FERRITIN, TIBC, IRON, RETICCTPCT in the last 72 hours.  Urine analysis:    Component Value Date/Time   COLORURINE YELLOW 07/28/2024 1224   APPEARANCEUR HAZY (A) 07/28/2024 1224   LABSPEC 1.013 07/28/2024 1224   PHURINE 5.0 07/28/2024 1224   GLUCOSEU NEGATIVE 07/28/2024 1224   HGBUR NEGATIVE 07/28/2024 1224   BILIRUBINUR NEGATIVE 07/28/2024 1224   KETONESUR NEGATIVE 07/28/2024 1224   PROTEINUR NEGATIVE 07/28/2024 1224   NITRITE NEGATIVE 07/28/2024 1224   LEUKOCYTESUR NEGATIVE 07/28/2024 1224    Sepsis Labs: Lactic Acid, Venous    Component Value Date/Time   LATICACIDVEN 2.3 (HH) 07/28/2024 2326    MICROBIOLOGY: Recent Results (from the past 240 hours)  Blood culture (routine x 2)     Status: None (Preliminary result)   Collection Time: 07/28/24 11:25 AM   Specimen: BLOOD RIGHT FOREARM  Result Value Ref Range Status   Specimen Description BLOOD RIGHT FOREARM  Final   Special Requests   Final    BOTTLES  DRAWN AEROBIC AND ANAEROBIC Blood Culture adequate volume   Culture   Final    NO GROWTH < 24 HOURS Performed at [facility], [address] [place], [place] [address]    Report Status PENDING  Incomplete  Blood culture (routine x 2)     Status: None (Preliminary result)   Collection Time: 07/28/24 11:32 AM   Specimen: BLOOD RIGHT HAND  Result Value Ref Range Status   Specimen Description BLOOD RIGHT HAND  Final   Special Requests   Final    BOTTLES DRAWN AEROBIC AND ANAEROBIC Blood Culture adequate volume   Culture   Final    NO GROWTH < 24 HOURS Performed at [facility], [address] [place], [place] [address]    Report Status PENDING  Incomplete  Resp panel by RT-PCR (RSV, Flu A&B, Covid) Anterior Nasal Swab     Status: None   Collection Time: 07/28/24  5:34 PM   Specimen: Anterior Nasal Swab  Result Value Ref Range Status   SARS Coronavirus 2 by RT PCR NEGATIVE NEGATIVE Final   Influenza A by PCR NEGATIVE NEGATIVE Final   Influenza B by PCR NEGATIVE NEGATIVE Final    Comment: (NOTE) The Xpert Xpress SARS-CoV-2/FLU/RSV plus assay is intended as an aid in the diagnosis of influenza from Nasopharyngeal swab specimens and should not be used as a sole basis for treatment. Nasal washings and aspirates are unacceptable for Xpert Xpress SARS-CoV-2/FLU/RSV testing.  Fact Sheet for Patients: BloggerCourse.com  Fact Sheet for Healthcare Providers: SeriousBroker.it  This test is not yet approved or cleared by the [place]  FDA and has been authorized for detection and/or diagnosis of SARS-CoV-2 by FDA under an Emergency Use Authorization (EUA). This EUA will remain in effect (meaning this test can be used) for the duration of the COVID-19 declaration under Section 564(b)(1) of the Act, 21 U.S.C. section 360bbb-3(b)(1), unless the authorization is terminated or revoked.     Resp Syncytial Virus by PCR  NEGATIVE NEGATIVE Final    Comment: (NOTE) Fact Sheet for Patients: BloggerCourse.com  Fact Sheet for Healthcare Providers: SeriousBroker.it  This test is not yet approved or cleared by the [place]  FDA and has been authorized  for detection and/or diagnosis of SARS-CoV-2 by FDA under an Emergency Use Authorization (EUA). This EUA will remain in effect (meaning this test can be used) for the duration of the COVID-19 declaration under Section 564(b)(1) of the Act, 21 U.S.C. section 360bbb-3(b)(1), unless the authorization is terminated or revoked.  Performed at [facility], [address], [place], [place] [address]     RADIOLOGY STUDIES/RESULTS: CT Head Wo Contrast Result Date: 07/28/2024 EXAM: CT HEAD WITHOUT CONTRAST 07/28/2024 12:56:00 PM TECHNIQUE: CT of the head was performed without the administration of intravenous contrast. Automated exposure control, iterative reconstruction, and/or weight based adjustment of the mA/kV was utilized to reduce the radiation dose to as low as reasonably achievable. COMPARISON: CT of the head dated 06/06/2024. CLINICAL HISTORY: Head trauma, minor (Age >= 65y). AMS FINDINGS: BRAIN AND VENTRICLES: No acute hemorrhage. Gray-white differentiation is preserved. No hydrocephalus. No extra-axial collection. No mass effect or midline shift. Moderate amount of generalized cerebral and cerebellar volume loss present. Moderate periventricular white matter disease. ORBITS: The patient is status post bilateral lens replacement. SINUSES: No acute abnormality. SOFT TISSUES AND SKULL: No acute soft tissue abnormality. No skull fracture. IMPRESSION: 1. No acute intracranial abnormality. 2. Moderate amount of generalized cerebral and cerebellar volume loss. 3. Moderate periventricular white matter disease. Electronically signed by: evalene coho 07/28/2024 01:01 PM EDT RP Workstation: [id number]   DG Chest  Portable 1 View Result Date: 07/28/2024 CLINICAL DATA:  Recent syncopal episode EXAM: PORTABLE CHEST 1 VIEW COMPARISON:  06/06/2024 CT FINDINGS: Cardiac shadow is within normal limits. Aortic calcifications are seen. Chronic scarring in the bases is noted right slightly greater than left. No focal infiltrate or effusion is seen. No bony abnormality is noted. IMPRESSION: Chronic scarring in the bases. Electronically Signed   By: Oneil Devonshire M.D.   On: 07/28/2024 12:11     LOS: 0 days   Donalda Applebaum, MD  [facility]    To contact the attending provider between 7A-7P or the covering provider during after hours 7P-7A, please log into the web site [email or web address] and access using universal [place] password for that web site. If you do not have the password, please call the hospital operator.  07/29/2024, 9:21 AM

## 2024-07-30 ENCOUNTER — Inpatient Hospital Stay (HOSPITAL_COMMUNITY)

## 2024-07-30 DIAGNOSIS — R6510 Systemic inflammatory response syndrome (SIRS) of non-infectious origin without acute organ dysfunction: Secondary | ICD-10-CM | POA: Diagnosis not present

## 2024-07-30 DIAGNOSIS — R55 Syncope and collapse: Secondary | ICD-10-CM | POA: Diagnosis not present

## 2024-07-30 LAB — BASIC METABOLIC PANEL WITH GFR
Anion gap: 7 (ref 5–15)
BUN: 8 mg/dL (ref 8–23)
CO2: 21 mmol/L — ABNORMAL LOW (ref 22–32)
Calcium: 8.4 mg/dL — ABNORMAL LOW (ref 8.9–10.3)
Chloride: 110 mmol/L (ref 98–111)
Creatinine, Ser: 0.9 mg/dL (ref 0.44–1.00)
GFR, Estimated: >60 mL/min (ref >60)
Glucose, Bld: 85 mg/dL (ref 70–99)
Potassium: 3.7 mmol/L (ref 3.5–5.1)
Sodium: 138 mmol/L (ref 135–145)

## 2024-07-30 LAB — ECHOCARDIOGRAM COMPLETE
AR max vel: 1.63 cm2
AV Area VTI: 1.7 cm2
AV Area mean vel: 1.45 cm2
AV Mean grad: 3 mmHg
AV Peak grad: 5.5 mmHg
Ao pk vel: 1.17 m/s
Area-P 1/2: 2.62 cm2
Height: 65 in
P 1/2 time: 577 ms
S' Lateral: 2.7 cm
Weight: 2081.14 [oz_av]

## 2024-07-30 NOTE — Progress Notes (Signed)
  Echocardiogram 2D Echocardiogram has been performed.  Stacy Valencia 07/30/2024, 2:06 PM

## 2024-07-30 NOTE — Progress Notes (Signed)
 PROGRESS NOTE        PATIENT DETAILS Name: Stacy Valencia Age: 89 y.o. Sex: female Date of Birth: Feb 23, 1933 Admit Date: 07/28/2024 Admitting Physician Maximino DELENA Sharps, MD ERE:Fnmmnt, Beverley, MD  Brief Summary: Patient is a 89 y.o.  female with history of A-fib, HFpEF, VTE, chronic hypotension on midodrine -who had nausea/vomiting for 1-2 days-and had a episode of syncope/presyncope on day of admission associated with hypotension (BP 70/42 per H&P-by EMS).  Patient was brought to the ED and subsequently admitted to the hospitalist service.  Significant events: 8/7>> admit to [facility]  Significant studies: 8/7>>CXR: No PNA 8/7>> CT head: No acute intracranial abnormality.  Significant microbiology data: 8/7>> COVID/influenza/RSV PCR: Negative 8/7>> blood culture: No growth.  Procedures: None  Consults: None  Subjective:  Patient in bed, appears comfortable, denies any headache, no fever, no chest pain or pressure, no shortness of breath , no abdominal pain. No new focal weakness.   Objective: Vitals: Blood pressure (!) 107/53, pulse 65, temperature 98.2 F (36.8 C), temperature source Oral, resp. rate 15, height 5' 5 (1.651 m), weight 59 kg, SpO2 95%.   Exam:  Awake Alert, No new F.N deficits, Normal affect .AT,PERRAL Supple Neck, No JVD,   Symmetrical Chest wall movement, Good air movement bilaterally, CTAB RRR,No Gallops, Rubs or new Murmurs,  +ve B.Sounds, Abd Soft, No tenderness,   No Cyanosis, Clubbing or edema     Assessment/Plan: Syncope versus presyncope Unclear if she actually lost consciousness-patient denies losing consciousness-per H&P-husband relates that the patient did lose consciousness for a few minutes. Etiology likely secondary to hypotension due to nausea/vomiting (EMS reports BP 70/42 and route to ED), continue hydration with IV fluids, Telemetry-negative so far Echo pending BP stabilizing-stop IV fluids-continue  midodrine  Mobilize with PT/OT/check orthostatics.  Patient qualifies for SNF, husband wants to take her to SNF as well await bed for placement.  Nausea/vomiting Probably viral syndrome Has resolved with supportive care As needed antiemetics.  SIRS Probably secondary to viral gastroenteritis No evidence of UTI on UA Blood cultures negative so far Suspect we can stop antibiotics and monitor.   Chronic hypotension/orthostatic hypotension Chronic issue Likely worsened due to GI illness/nausea/vomiting-some issues with intermittent hypotension overnight-will hydrate further with IV fluids today. Prior a.m. cortisol November-stable at 15.2 On midodrine -will increase dosage to 10 mg 3 times daily. Placed TED stockings/abdominal binder if able/patient cooperates.  Persistent A-fib Continue anticoagulation Metoprolol  on hold due to soft blood pressure.  History of VTE Continue anticoagulation.  Hypothyroidism Synthroid  TSH minimally elevated-stable for continued outpatient monitoring/adjustment of Synthroid  regimen.  GERD PPI  Code status:   Code Status: Full Code   DVT Prophylaxis:Place TED hose Start: 07/29/24 0935 rivaroxaban  (XARELTO ) tablet 20 mg    Family Communication: Husband updated in detail on 07/30/2024 over the phone   Disposition Plan: Status is: Observation The patient will require care spanning > 2 midnights and should be moved to inpatient because: Severity of illness   Planned Discharge Destination:Home   Diet: Diet Order             DIET SOFT Room service appropriate? Yes; Fluid consistency: Thin  Diet effective now                     Antimicrobial agents: Anti-infectives (From admission, onward)    Start     Dose/Rate Route  Frequency Ordered Stop   07/29/24 1000  vancomycin  (VANCOREADY) IVPB 750 mg/150 mL  Status:  Discontinued        750 mg 150 mL/hr over 60 Minutes Intravenous Every 24 hours 07/28/24 1736 07/29/24 0930   07/28/24  1730  meropenem  (MERREM ) 1 g in sodium chloride  0.9 % 100 mL IVPB  Status:  Discontinued        1 g 200 mL/hr over 30 Minutes Intravenous Every 12 hours 07/28/24 1711 07/29/24 0930   07/28/24 1200  vancomycin  (VANCOCIN ) IVPB 1000 mg/200 mL premix        1,000 mg 200 mL/hr over 60 Minutes Intravenous  Once 07/28/24 1152 07/28/24 1319   07/28/24 1200  meropenem  (MERREM ) 1 g in sodium chloride  0.9 % 100 mL IVPB  Status:  Discontinued        1 g 200 mL/hr over 30 Minutes Intravenous  Once 07/28/24 1154 07/28/24 1156   07/28/24 1200  meropenem  (MERREM ) 1 g in sodium chloride  0.9 % 100 mL IVPB        1 g 200 mL/hr over 30 Minutes Intravenous STAT 07/28/24 1156 07/28/24 1238        MEDICATIONS: Scheduled Meds:  midodrine   10 mg Oral 2 times per day   pantoprazole   40 mg Oral Daily   rivaroxaban   20 mg Oral q1800   sodium chloride  flush  3 mL Intravenous Q12H   thiamine   100 mg Oral Daily   Continuous Infusions:   PRN Meds:.acetaminophen  **OR** acetaminophen , calcium  carbonate, trimethobenzamide    I have personally reviewed following labs and imaging studies  LABORATORY DATA: CBC: Recent Labs  Lab 07/28/24 1123 07/28/24 1145 07/29/24 0404  WBC 15.7*  --  9.5  NEUTROABS 13.6*  --   --   HGB 14.7 15.3* 10.4*  HCT 45.1 45.0 31.2*  MCV 105.1*  --  103.7*  PLT 218  --  163    Basic Metabolic Panel: Recent Labs  Lab 07/28/24 1123 07/28/24 1145 07/29/24 0404 07/30/24 0542  NA 141 142 138 138  K 4.1 4.0 3.7 3.7  CL 106 106 108 110  CO2 22  --  22 21*  GLUCOSE 158* 152* 84 85  BUN 12 11 11 8   CREATININE 1.06* 0.80 0.76 0.90  CALCIUM  9.2  --  8.3* 8.4*    GFR: Estimated Creatinine Clearance: 38.1 mL/min (by C-G formula based on SCr of 0.9 mg/dL).  Liver Function Tests: Recent Labs  Lab 07/28/24 1123  AST 38  ALT 14  ALKPHOS 76  BILITOT 0.9  PROT 6.4*  ALBUMIN 3.1*    Thyroid  Function Tests: Recent Labs    07/28/24 1836  TSH 4.612*  FREET4 1.05     Anemia Panel: No results for input(s): VITAMINB12, FOLATE, FERRITIN, TIBC, IRON, RETICCTPCT in the last 72 hours.  Urine analysis:    Component Value Date/Time   COLORURINE YELLOW 07/28/2024 1224   APPEARANCEUR HAZY (A) 07/28/2024 1224   LABSPEC 1.013 07/28/2024 1224   PHURINE 5.0 07/28/2024 1224   GLUCOSEU NEGATIVE 07/28/2024 1224   HGBUR NEGATIVE 07/28/2024 1224   BILIRUBINUR NEGATIVE 07/28/2024 1224   KETONESUR NEGATIVE 07/28/2024 1224   PROTEINUR NEGATIVE 07/28/2024 1224   NITRITE NEGATIVE 07/28/2024 1224   LEUKOCYTESUR NEGATIVE 07/28/2024 1224    Sepsis Labs: Lactic Acid, Venous    Component Value Date/Time   LATICACIDVEN 2.3 (HH) 07/28/2024 2326    MICROBIOLOGY: Recent Results (from the past 240 hours)  Blood culture (routine x 2)  Status: None (Preliminary result)   Collection Time: 07/28/24 11:25 AM   Specimen: BLOOD RIGHT FOREARM  Result Value Ref Range Status   Specimen Description BLOOD RIGHT FOREARM  Final   Special Requests   Final    BOTTLES DRAWN AEROBIC AND ANAEROBIC Blood Culture adequate volume   Culture   Final    NO GROWTH 2 DAYS Performed at [facility], [address], [place], [place] [address]    Report Status PENDING  Incomplete  Blood culture (routine x 2)     Status: None (Preliminary result)   Collection Time: 07/28/24 11:32 AM   Specimen: BLOOD RIGHT HAND  Result Value Ref Range Status   Specimen Description BLOOD RIGHT HAND  Final   Special Requests   Final    BOTTLES DRAWN AEROBIC AND ANAEROBIC Blood Culture adequate volume   Culture   Final    NO GROWTH 2 DAYS Performed at [facility], [address], [place], [place] [address]    Report Status PENDING  Incomplete  Resp panel by RT-PCR (RSV, Flu A&B, Covid) Anterior Nasal Swab     Status: None   Collection Time: 07/28/24  5:34 PM   Specimen: Anterior Nasal Swab  Result Value Ref Range Status   SARS Coronavirus 2 by RT PCR NEGATIVE NEGATIVE Final    Influenza A by PCR NEGATIVE NEGATIVE Final   Influenza B by PCR NEGATIVE NEGATIVE Final    Comment: (NOTE) The Xpert Xpress SARS-CoV-2/FLU/RSV plus assay is intended as an aid in the diagnosis of influenza from Nasopharyngeal swab specimens and should not be used as a sole basis for treatment. Nasal washings and aspirates are unacceptable for Xpert Xpress SARS-CoV-2/FLU/RSV testing.  Fact Sheet for Patients: BloggerCourse.com  Fact Sheet for Healthcare Providers: SeriousBroker.it  This test is not yet approved or cleared by the [place]  FDA and has been authorized for detection and/or diagnosis of SARS-CoV-2 by FDA under an Emergency Use Authorization (EUA). This EUA will remain in effect (meaning this test can be used) for the duration of the COVID-19 declaration under Section 564(b)(1) of the Act, 21 U.S.C. section 360bbb-3(b)(1), unless the authorization is terminated or revoked.     Resp Syncytial Virus by PCR NEGATIVE NEGATIVE Final    Comment: (NOTE) Fact Sheet for Patients: BloggerCourse.com  Fact Sheet for Healthcare Providers: SeriousBroker.it  This test is not yet approved or cleared by the [place]  FDA and has been authorized for detection and/or diagnosis of SARS-CoV-2 by FDA under an Emergency Use Authorization (EUA). This EUA will remain in effect (meaning this test can be used) for the duration of the COVID-19 declaration under Section 564(b)(1) of the Act, 21 U.S.C. section 360bbb-3(b)(1), unless the authorization is terminated or revoked.  Performed at [facility], [address] [place], [place] [address]     RADIOLOGY STUDIES/RESULTS: CT Head Wo Contrast Result Date: 07/28/2024 EXAM: CT HEAD WITHOUT CONTRAST 07/28/2024 12:56:00 PM TECHNIQUE: CT of the head was performed without the administration of intravenous contrast. Automated  exposure control, iterative reconstruction, and/or weight based adjustment of the mA/kV was utilized to reduce the radiation dose to as low as reasonably achievable. COMPARISON: CT of the head dated 06/06/2024. CLINICAL HISTORY: Head trauma, minor (Age >= 65y). AMS FINDINGS: BRAIN AND VENTRICLES: No acute hemorrhage. Gray-white differentiation is preserved. No hydrocephalus. No extra-axial collection. No mass effect or midline shift. Moderate amount of generalized cerebral and cerebellar volume loss present. Moderate periventricular white matter disease.  ORBITS: The patient is status post bilateral lens replacement. SINUSES: No acute abnormality. SOFT TISSUES AND SKULL: No acute soft tissue abnormality. No skull fracture. IMPRESSION: 1. No acute intracranial abnormality. 2. Moderate amount of generalized cerebral and cerebellar volume loss. 3. Moderate periventricular white matter disease. Electronically signed by: evalene coho 07/28/2024 01:01 PM EDT RP Workstation: [id number]   DG Chest Portable 1 View Result Date: 07/28/2024 CLINICAL DATA:  Recent syncopal episode EXAM: PORTABLE CHEST 1 VIEW COMPARISON:  06/06/2024 CT FINDINGS: Cardiac shadow is within normal limits. Aortic calcifications are seen. Chronic scarring in the bases is noted right slightly greater than left. No focal infiltrate or effusion is seen. No bony abnormality is noted. IMPRESSION: Chronic scarring in the bases. Electronically Signed   By: Oneil Devonshire M.D.   On: 07/28/2024 12:11     LOS: 1 day   Lavada Stank, MD  [facility]    To contact the attending provider between 7A-7P or the covering provider during after hours 7P-7A, please log into the web site [email or web address] and access using universal [place] password for that web site. If you do not have the password, please call the hospital operator.  07/30/2024, 11:04 AM

## 2024-07-30 NOTE — TOC Transition Note (Incomplete)
 Transition of Care [facility]) - Discharge Note   Patient Details  Name: Stacy Valencia MRN: [id number] Date of Birth: 08/29/1933  Transition [facility]) CM/SW Contact:  Darin JULIANNA Bend, LCSW Phone Number: 07/30/2024, 3:22 PM   Clinical Narrative:    CSW made contact with the pt's husband Nour Rodrigues / [phone]) to obtain disposition support (SNF referral). The husband expressed he would like for the pt to go to [facility] as SNF preference choice.   CSW informed the husband a referral will be made there and he will be updated if the referral is accepted or denied.   No other needs identified of this Clinical research associate. Please contact TOC if there is additional disposition support needed.      Patient Goals and CMS Choice    Discharge Placement    Discharge Plan and Services Additional resources added to the After Visit Summary for       Social Drivers of Health (SDOH) Interventions SDOH Screenings   Food Insecurity: No Food Insecurity (07/28/2024)  Housing: Low Risk  (07/28/2024)  Transportation Needs: No Transportation Needs (07/28/2024)  Utilities: Not At Risk (07/28/2024)  Social Connections: Socially Integrated (07/28/2024)  Tobacco Use: Low Risk  (07/28/2024)   Readmission Risk Interventions    04/14/2024    2:39 PM  Readmission Risk Prevention Plan  Post Dischage Appt Complete  Medication Screening Complete  Transportation Screening Complete

## 2024-07-30 NOTE — Plan of Care (Signed)
  Problem: Education: ?Goal: Knowledge of General Education information will improve ?Description: Including pain rating scale, medication(s)/side effects and non-pharmacologic comfort measures ?Outcome: Progressing ?  ?Problem: Clinical Measurements: ?Goal: Will remain free from infection ?Outcome: Progressing ?  ?Problem: Activity: ?Goal: Risk for activity intolerance will decrease ?Outcome: Progressing ?  ?Problem: Coping: ?Goal: Level of anxiety will decrease ?Outcome: Progressing ?  ?Problem: Safety: ?Goal: Ability to remain free from injury will improve ?Outcome: Progressing ?  ?

## 2024-07-30 NOTE — Progress Notes (Signed)
 Patient refused for orthostatic vital sign for now. Will later in day.

## 2024-07-31 DIAGNOSIS — R6510 Systemic inflammatory response syndrome (SIRS) of non-infectious origin without acute organ dysfunction: Secondary | ICD-10-CM | POA: Diagnosis not present

## 2024-07-31 LAB — CBC WITH DIFFERENTIAL/PLATELET
Abs Immature Granulocytes: 0.04 K/uL (ref 0.00–0.07)
Basophils Absolute: 0.1 K/uL (ref 0.0–0.1)
Basophils Relative: 1 %
Eosinophils Absolute: 0.2 K/uL (ref 0.0–0.5)
Eosinophils Relative: 3 %
HCT: 37.6 % (ref 36.0–46.0)
Hemoglobin: 12.6 g/dL (ref 12.0–15.0)
Immature Granulocytes: 1 %
Lymphocytes Relative: 53 %
Lymphs Abs: 3.5 K/uL (ref 0.7–4.0)
MCH: 34 pg (ref 26.0–34.0)
MCHC: 33.5 g/dL (ref 30.0–36.0)
MCV: 101.3 fL — ABNORMAL HIGH (ref 80.0–100.0)
Monocytes Absolute: 0.5 K/uL (ref 0.1–1.0)
Monocytes Relative: 8 %
Neutro Abs: 2.2 K/uL (ref 1.7–7.7)
Neutrophils Relative %: 34 %
Platelets: 190 K/uL (ref 150–400)
RBC: 3.71 MIL/uL — ABNORMAL LOW (ref 3.87–5.11)
RDW: 14.4 % (ref 11.5–15.5)
WBC: 6.6 K/uL (ref 4.0–10.5)
nRBC: 0 % (ref 0.0–0.2)

## 2024-07-31 LAB — BASIC METABOLIC PANEL WITH GFR
Anion gap: 8 (ref 5–15)
BUN: 7 mg/dL — ABNORMAL LOW (ref 8–23)
CO2: 27 mmol/L (ref 22–32)
Calcium: 9.1 mg/dL (ref 8.9–10.3)
Chloride: 106 mmol/L (ref 98–111)
Creatinine, Ser: 0.76 mg/dL (ref 0.44–1.00)
GFR, Estimated: >60 mL/min (ref >60)
Glucose, Bld: 87 mg/dL (ref 70–99)
Potassium: 3.5 mmol/L (ref 3.5–5.1)
Sodium: 141 mmol/L (ref 135–145)

## 2024-07-31 LAB — MAGNESIUM: Magnesium: 1.6 mg/dL — ABNORMAL LOW (ref 1.7–2.4)

## 2024-07-31 MED ORDER — MAGNESIUM SULFATE 4 GM/100ML IV SOLN
4.0000 g | Freq: Once | INTRAVENOUS | Status: AC
  Administered 2024-07-31: 4 g via INTRAVENOUS
  Filled 2024-07-31: qty 100

## 2024-07-31 MED ORDER — POTASSIUM CHLORIDE CRYS ER 20 MEQ PO TBCR
40.0000 meq | EXTENDED_RELEASE_TABLET | Freq: Once | ORAL | Status: AC
  Administered 2024-07-31: 40 meq via ORAL
  Filled 2024-07-31: qty 2

## 2024-07-31 NOTE — Progress Notes (Signed)
 PROGRESS NOTE        PATIENT DETAILS Name: Stacy Valencia Age: 89 y.o. Sex: female Date of Birth: 1933/02/27 Admit Date: 07/28/2024 Admitting Physician Maximino DELENA Sharps, MD ERE:Fnmmnt, Beverley, MD  Brief Summary: Patient is a 89 y.o.  female with history of A-fib, HFpEF, VTE, chronic hypotension on midodrine -who had nausea/vomiting for 1-2 days-and had a episode of syncope/presyncope on day of admission associated with hypotension (BP 70/42 per H&P-by EMS).  Patient was brought to the ED and subsequently admitted to the hospitalist service.  Significant events: 8/7>> admit to [facility]  Significant studies: 8/7>>CXR: No PNA 8/7>> CT head: No acute intracranial abnormality.  Significant microbiology data: 8/7>> COVID/influenza/RSV PCR: Negative 8/7>> blood culture: No growth.  Procedures: None  Consults: None  Subjective:  Patient in bed, appears comfortable, denies any headache, no fever, no chest pain or pressure, no shortness of breath , no abdominal pain. No new focal weakness.    Objective: Vitals: Blood pressure 118/63, pulse (!) 59, temperature 98 F (36.7 C), temperature source Axillary, resp. rate (!) 22, height 5' 5 (1.651 m), weight 59 kg, SpO2 95%.   Exam:  Awake Alert, No new F.N deficits, Normal affect [place].AT,PERRAL Supple Neck, No JVD,   Symmetrical Chest wall movement, Good air movement bilaterally, CTAB RRR,No Gallops, Rubs or new Murmurs,  +ve B.Sounds, Abd Soft, No tenderness,   No Cyanosis, Clubbing or edema     Assessment/Plan: Syncope versus presyncope Unclear if she actually lost consciousness-patient denies losing consciousness-per H&P-husband relates that the patient did lose consciousness for a few minutes. Etiology likely secondary to hypotension due to nausea/vomiting (EMS reports BP 70/42 and route to ED), continue hydration with IV fluids, Telemetry-negative so far Echo pending BP stabilizing-stop IV fluids-continue  midodrine  Mobilize with PT/OT/check orthostatics.  Patient qualifies for SNF, husband wants to take her to SNF as well await bed for placement.  Nausea/vomiting Probably viral syndrome Has resolved with supportive care As needed antiemetics.  SIRS Probably secondary to viral gastroenteritis No evidence of UTI on UA Blood cultures negative so far Suspect we can stop antibiotics and monitor.   Chronic hypotension/orthostatic hypotension Chronic issue Likely worsened due to GI illness/nausea/vomiting-some issues with intermittent hypotension overnight-will hydrate further with IV fluids today. Prior a.m. cortisol November-stable at 15.2 On midodrine -will increase dosage to 10 mg 3 times daily. Placed TED stockings/abdominal binder if able/patient cooperates.  Persistent A-fib Continue anticoagulation Metoprolol  on hold due to soft blood pressure.  History of VTE Continue anticoagulation.  Hypothyroidism Synthroid  TSH minimally elevated-stable for continued outpatient monitoring/adjustment of Synthroid  regimen.  GERD PPI  Code status:   Code Status: Full Code   DVT Prophylaxis:Place TED hose Start: 07/29/24 0935 rivaroxaban  (XARELTO ) tablet 20 mg    Family Communication: Husband updated in detail on 07/30/2024 over the phone   Disposition Plan: Status is: Observation The patient will require care spanning > 2 midnights and should be moved to inpatient because: Severity of illness   Planned Discharge Destination:Home   Diet: Diet Order             DIET SOFT Room service appropriate? Yes; Fluid consistency: Thin  Diet effective now                     Antimicrobial agents: Anti-infectives (From admission, onward)    Start  Dose/Rate Route Frequency Ordered Stop   07/29/24 1000  vancomycin  (VANCOREADY) IVPB 750 mg/150 mL  Status:  Discontinued        750 mg 150 mL/hr over 60 Minutes Intravenous Every 24 hours 07/28/24 1736 07/29/24 0930   07/28/24  1730  meropenem  (MERREM ) 1 g in sodium chloride  0.9 % 100 mL IVPB  Status:  Discontinued        1 g 200 mL/hr over 30 Minutes Intravenous Every 12 hours 07/28/24 1711 07/29/24 0930   07/28/24 1200  vancomycin  (VANCOCIN ) IVPB 1000 mg/200 mL premix        1,000 mg 200 mL/hr over 60 Minutes Intravenous  Once 07/28/24 1152 07/28/24 1319   07/28/24 1200  meropenem  (MERREM ) 1 g in sodium chloride  0.9 % 100 mL IVPB  Status:  Discontinued        1 g 200 mL/hr over 30 Minutes Intravenous  Once 07/28/24 1154 07/28/24 1156   07/28/24 1200  meropenem  (MERREM ) 1 g in sodium chloride  0.9 % 100 mL IVPB        1 g 200 mL/hr over 30 Minutes Intravenous STAT 07/28/24 1156 07/28/24 1238        MEDICATIONS: Scheduled Meds:  midodrine   10 mg Oral 2 times per day   pantoprazole   40 mg Oral Daily   rivaroxaban   20 mg Oral q1800   sodium chloride  flush  3 mL Intravenous Q12H   thiamine   100 mg Oral Daily   Continuous Infusions:   PRN Meds:.acetaminophen  **OR** acetaminophen , calcium  carbonate, trimethobenzamide    I have personally reviewed following labs and imaging studies  LABORATORY DATA: CBC: Recent Labs  Lab 07/28/24 1123 07/28/24 1145 07/29/24 0404  WBC 15.7*  --  9.5  NEUTROABS 13.6*  --   --   HGB 14.7 15.3* 10.4*  HCT 45.1 45.0 31.2*  MCV 105.1*  --  103.7*  PLT 218  --  163    Basic Metabolic Panel: Recent Labs  Lab 07/28/24 1123 07/28/24 1145 07/29/24 0404 07/30/24 0542  NA 141 142 138 138  K 4.1 4.0 3.7 3.7  CL 106 106 108 110  CO2 22  --  22 21*  GLUCOSE 158* 152* 84 85  BUN 12 11 11 8   CREATININE 1.06* 0.80 0.76 0.90  CALCIUM  9.2  --  8.3* 8.4*    GFR: Estimated Creatinine Clearance: 38.1 mL/min (by C-G formula based on SCr of 0.9 mg/dL).  Liver Function Tests: Recent Labs  Lab 07/28/24 1123  AST 38  ALT 14  ALKPHOS 76  BILITOT 0.9  PROT 6.4*  ALBUMIN 3.1*    Thyroid  Function Tests: Recent Labs    07/28/24 1836  TSH 4.612*  FREET4 1.05     Anemia Panel: No results for input(s): VITAMINB12, FOLATE, FERRITIN, TIBC, IRON, RETICCTPCT in the last 72 hours.  Urine analysis:    Component Value Date/Time   COLORURINE YELLOW 07/28/2024 1224   APPEARANCEUR HAZY (A) 07/28/2024 1224   LABSPEC 1.013 07/28/2024 1224   PHURINE 5.0 07/28/2024 1224   GLUCOSEU NEGATIVE 07/28/2024 1224   HGBUR NEGATIVE 07/28/2024 1224   BILIRUBINUR NEGATIVE 07/28/2024 1224   KETONESUR NEGATIVE 07/28/2024 1224   PROTEINUR NEGATIVE 07/28/2024 1224   NITRITE NEGATIVE 07/28/2024 1224   LEUKOCYTESUR NEGATIVE 07/28/2024 1224    Sepsis Labs: Lactic Acid, Venous    Component Value Date/Time   LATICACIDVEN 2.3 (HH) 07/28/2024 2326    MICROBIOLOGY: Recent Results (from the past 240 hours)  Blood culture (routine x 2)  Status: None (Preliminary result)   Collection Time: 07/28/24 11:25 AM   Specimen: BLOOD RIGHT FOREARM  Result Value Ref Range Status   Specimen Description BLOOD RIGHT FOREARM  Final   Special Requests   Final    BOTTLES DRAWN AEROBIC AND ANAEROBIC Blood Culture adequate volume   Culture   Final    NO GROWTH 2 DAYS Performed at [facility], [address] [place], [place] [address]    Report Status PENDING  Incomplete  Blood culture (routine x 2)     Status: None (Preliminary result)   Collection Time: 07/28/24 11:32 AM   Specimen: BLOOD RIGHT HAND  Result Value Ref Range Status   Specimen Description BLOOD RIGHT HAND  Final   Special Requests   Final    BOTTLES DRAWN AEROBIC AND ANAEROBIC Blood Culture adequate volume   Culture   Final    NO GROWTH 2 DAYS Performed at [facility], [address], [place], [place] [address]    Report Status PENDING  Incomplete  Resp panel by RT-PCR (RSV, Flu A&B, Covid) Anterior Nasal Swab     Status: None   Collection Time: 07/28/24  5:34 PM   Specimen: Anterior Nasal Swab  Result Value Ref Range Status   SARS Coronavirus 2 by RT PCR NEGATIVE NEGATIVE Final    Influenza A by PCR NEGATIVE NEGATIVE Final   Influenza B by PCR NEGATIVE NEGATIVE Final    Comment: (NOTE) The Xpert Xpress SARS-CoV-2/FLU/RSV plus assay is intended as an aid in the diagnosis of influenza from Nasopharyngeal swab specimens and should not be used as a sole basis for treatment. Nasal washings and aspirates are unacceptable for Xpert Xpress SARS-CoV-2/FLU/RSV testing.  Fact Sheet for Patients: BloggerCourse.com  Fact Sheet for Healthcare Providers: SeriousBroker.it  This test is not yet approved or cleared by the [place]  FDA and has been authorized for detection and/or diagnosis of SARS-CoV-2 by FDA under an Emergency Use Authorization (EUA). This EUA will remain in effect (meaning this test can be used) for the duration of the COVID-19 declaration under Section 564(b)(1) of the Act, 21 U.S.C. section 360bbb-3(b)(1), unless the authorization is terminated or revoked.     Resp Syncytial Virus by PCR NEGATIVE NEGATIVE Final    Comment: (NOTE) Fact Sheet for Patients: BloggerCourse.com  Fact Sheet for Healthcare Providers: SeriousBroker.it  This test is not yet approved or cleared by the [place]  FDA and has been authorized for detection and/or diagnosis of SARS-CoV-2 by FDA under an Emergency Use Authorization (EUA). This EUA will remain in effect (meaning this test can be used) for the duration of the COVID-19 declaration under Section 564(b)(1) of the Act, 21 U.S.C. section 360bbb-3(b)(1), unless the authorization is terminated or revoked.  Performed at [facility], [address] [place], [place] [address]     RADIOLOGY STUDIES/RESULTS: ECHOCARDIOGRAM COMPLETE Result Date: 07/30/2024    ECHOCARDIOGRAM REPORT   Patient Name:   Stacy Valencia Date of Exam: 07/30/2024 Medical Rec #:  [id number]      Height:       65.0 in Accession #:     [id number]     Weight:       130.1 lb Date of Birth:  1933/07/04      BSA:          1.648 m Patient Age:    89 years       BP:  123/60 mmHg Patient Gender: F              HR:           65 bpm. Exam Location:  Inpatient Procedure: 2D Echo (Both Spectral and Color Flow Doppler were utilized during            procedure). Indications:    syncope  History:        Patient has prior history of Echocardiogram examinations, most                 recent 11/20/2023. Arrythmias:Atrial Fibrillation.  Sonographer:    Tinnie Barefoot RDCS Referring Phys: LEOMA DONALDA M GHIMIRE IMPRESSIONS  1. Left ventricular ejection fraction, by estimation, is 60 to 65%. The left ventricle has normal function. Left ventricular endocardial border not optimally defined to evaluate regional wall motion. Left ventricular diastolic parameters are indeterminate.  2. Right ventricular systolic function is normal. The right ventricular size is normal. There is normal pulmonary artery systolic pressure.  3. The mitral valve is normal in structure. No evidence of mitral valve regurgitation. No evidence of mitral stenosis.  4. The aortic valve was not well visualized. Aortic valve regurgitation is moderate.  5. The inferior vena cava is normal in size with greater than 50% respiratory variability, suggesting right atrial pressure of 3 mmHg. FINDINGS  Left Ventricle: Left ventricular ejection fraction, by estimation, is 60 to 65%. The left ventricle has normal function. Left ventricular endocardial border not optimally defined to evaluate regional wall motion. The left ventricular internal cavity size was normal in size. There is no left ventricular hypertrophy. Left ventricular diastolic parameters are indeterminate. Right Ventricle: The right ventricular size is normal. Right vetricular wall thickness was not well visualized. Right ventricular systolic function is normal. There is normal pulmonary artery systolic pressure. The tricuspid  regurgitant velocity is 2.32 m/s, and with an assumed right atrial pressure of 3 mmHg, the estimated right ventricular systolic pressure is 24.5 mmHg. Left Atrium: Left atrial size was normal in size. Right Atrium: Right atrial size was normal in size. Pericardium: There is no evidence of pericardial effusion. Mitral Valve: The mitral valve is normal in structure. No evidence of mitral valve regurgitation. No evidence of mitral valve stenosis. Tricuspid Valve: The tricuspid valve is normal in structure. Tricuspid valve regurgitation is trivial. No evidence of tricuspid stenosis. Aortic Valve: The aortic valve was not well visualized. Aortic valve regurgitation is moderate. Aortic regurgitation PHT measures 577 msec. Aortic valve mean gradient measures 3.0 mmHg. Aortic valve peak gradient measures 5.5 mmHg. Aortic valve area, by VTI measures 1.70 cm. Pulmonic Valve: The pulmonic valve was not well visualized. Pulmonic valve regurgitation is not visualized. No evidence of pulmonic stenosis. Aorta: The aortic root and ascending aorta are structurally normal, with no evidence of dilitation. Venous: The inferior vena cava is normal in size with greater than 50% respiratory variability, suggesting right atrial pressure of 3 mmHg. IAS/Shunts: No atrial level shunt detected by color flow Doppler.  LEFT VENTRICLE PLAX 2D LVIDd:         4.60 cm   Diastology LVIDs:         2.70 cm   LV e' medial:    7.62 cm/s LV PW:         0.80 cm   LV E/e' medial:  8.0 LV IVS:        0.70 cm   LV e' lateral:   7.07 cm/s LVOT diam:  1.90 cm   LV E/e' lateral: 8.6 LV SV:         50 LV SV Index:   30 LVOT Area:     2.84 cm  RIGHT VENTRICLE             IVC RV Basal diam:  3.10 cm     IVC diam: 1.70 cm RV S prime:     10.00 cm/s TAPSE (M-mode): 1.8 cm LEFT ATRIUM           Index        RIGHT ATRIUM           Index LA diam:      3.20 cm 1.94 cm/m   RA Area:     13.00 cm LA Vol (A4C): 35.6 ml 21.61 ml/m  RA Volume:   31.70 ml  19.24 ml/m   AORTIC VALVE AV Area (Vmax):    1.63 cm AV Area (Vmean):   1.45 cm AV Area (VTI):     1.70 cm AV Vmax:           116.73 cm/s AV Vmean:          81.822 cm/s AV VTI:            0.294 m AV Peak Grad:      5.5 mmHg AV Mean Grad:      3.0 mmHg LVOT Vmax:         67.00 cm/s LVOT Vmean:        41.900 cm/s LVOT VTI:          0.176 m LVOT/AV VTI ratio: 0.60 AI PHT:            577 msec  AORTA Ao Root diam: 3.80 cm Ao Asc diam:  3.30 cm MITRAL VALVE               TRICUSPID VALVE MV Area (PHT): 2.62 cm    TR Peak grad:   21.5 mmHg MV Decel Time: 290 msec    TR Vmax:        232.00 cm/s MV E velocity: 60.90 cm/s MV A velocity: 50.10 cm/s  SHUNTS MV E/A ratio:  1.22        Systemic VTI:  0.18 m                            Systemic Diam: 1.90 cm Dorn Ross MD Electronically signed by Dorn Ross MD Signature Date/Time: 07/30/2024/2:44:05 PM    Final      LOS: 2 days   Lavada Stank, MD  [facility]    To contact the attending provider between 7A-7P or the covering provider during after hours 7P-7A, please log into the web site [email or web address] and access using universal [place] password for that web site. If you do not have the password, please call the hospital operator.  07/31/2024, 7:56 AM

## 2024-07-31 NOTE — Plan of Care (Signed)
  Problem: Health Behavior/Discharge Planning: Goal: Ability to manage health-related needs will improve Outcome: Progressing   Problem: Clinical Measurements: Goal: Will remain free from infection Outcome: Progressing   Problem: Coping: Goal: Level of anxiety will decrease Outcome: Progressing   Problem: Safety: Goal: Ability to remain free from injury will improve Outcome: Progressing   Problem: Skin Integrity: Goal: Risk for impaired skin integrity will decrease Outcome: Progressing

## 2024-08-01 DIAGNOSIS — R6510 Systemic inflammatory response syndrome (SIRS) of non-infectious origin without acute organ dysfunction: Secondary | ICD-10-CM | POA: Diagnosis not present

## 2024-08-01 LAB — CORTISOL: Cortisol, Plasma: 13.9 ug/dL

## 2024-08-01 LAB — BASIC METABOLIC PANEL WITH GFR
Anion gap: 9 (ref 5–15)
BUN: 7 mg/dL — ABNORMAL LOW (ref 8–23)
CO2: 23 mmol/L (ref 22–32)
Calcium: 8.9 mg/dL (ref 8.9–10.3)
Chloride: 104 mmol/L (ref 98–111)
Creatinine, Ser: 0.71 mg/dL (ref 0.44–1.00)
GFR, Estimated: >60 mL/min (ref >60)
Glucose, Bld: 91 mg/dL (ref 70–99)
Potassium: 4.3 mmol/L (ref 3.5–5.1)
Sodium: 136 mmol/L (ref 135–145)

## 2024-08-01 LAB — MAGNESIUM: Magnesium: 2.2 mg/dL (ref 1.7–2.4)

## 2024-08-01 NOTE — Progress Notes (Signed)
 PROGRESS NOTE        PATIENT DETAILS Name: Stacy Valencia Age: 89 y.o. Sex: female Date of Birth: 05/24/1933 Admit Date: 07/28/2024 Admitting Physician Maximino DELENA Sharps, MD ERE:Fnmmnt, Beverley, MD  Brief Summary: Patient is a 89 y.o.  female with history of A-fib, HFpEF, VTE, chronic hypotension on midodrine -who had nausea/vomiting for 1-2 days-and had a episode of syncope/presyncope on day of admission associated with hypotension (BP 70/42 per H&P-by EMS).  Patient was brought to the ED and subsequently admitted to the hospitalist service.  Significant events: 8/7>> admit to [facility]  Significant studies: 8/7>>CXR: No PNA 8/7>> CT head: No acute intracranial abnormality.  Significant microbiology data: 8/7>> COVID/influenza/RSV PCR: Negative 8/7>> blood culture: No growth.  Procedures: TTE   1. Left ventricular ejection fraction, by estimation, is 60 to 65%. The  left ventricle has normal function. Left ventricular endocardial border  not optimally defined to evaluate regional wall motion. Left ventricular  diastolic parameters are  indeterminate.   2. Right ventricular systolic function is normal. The right ventricular  size is normal. There is normal pulmonary artery systolic pressure.   3. The mitral valve is normal in structure. No evidence of mitral valve  regurgitation. No evidence of mitral stenosis.   4. The aortic valve was not well visualized. Aortic valve regurgitation  is moderate.   5. The inferior vena cava is normal in size with greater than 50%  respiratory variability, suggesting right atrial pressure of 3 mmHg.   Consults: None  Subjective: Patient in bed, appears comfortable, denies any headache, no fever, no chest pain or pressure, no shortness of breath , no abdominal pain. No focal weakness.    Objective: Vitals: Blood pressure 107/60, pulse 78, temperature 98.3 F (36.8 C), temperature source Oral, resp. rate 16, height 5' 5  (1.651 m), weight 59 kg, SpO2 94%.   Exam:  Awake Alert, No new F.N deficits, Normal affect [place].AT,PERRAL Supple Neck, No JVD,   Symmetrical Chest wall movement, Good air movement bilaterally, CTAB RRR,No Gallops, Rubs or new Murmurs,  +ve B.Sounds, Abd Soft, No tenderness,   No Cyanosis, Clubbing or edema     Assessment/Plan: Syncope versus presyncope Unclear if she actually lost consciousness-patient denies losing consciousness-per H&P-husband relates that the patient did lose consciousness for a few minutes. Etiology likely secondary to hypotension due to nausea/vomiting (EMS reports BP 70/42 and route to ED), continue hydration with IV fluids, Telemetry-negative so far Echocardiogram nonacute BP stabilizing-stop IV fluids-continue midodrine , TSH and free T4 stable, check random cortisol as well. Mobilize with PT/OT/check orthostatics.  Patient qualifies for SNF, husband wants to take her to SNF as well await bed for placement.  Nausea/vomiting Probably viral syndrome Has resolved with supportive care As needed antiemetics.  SIRS Probably secondary to viral gastroenteritis No evidence of UTI on UA Blood cultures negative so far Suspect we can stop antibiotics and monitor.   Chronic hypotension/orthostatic hypotension Chronic issue Likely worsened due to GI illness/nausea/vomiting-some issues with intermittent hypotension overnight-will hydrate further with IV fluids today. Prior a.m. cortisol November-stable at 15.2 On midodrine -will increase dosage to 10 mg 3 times daily. Placed TED stockings/abdominal binder if able/patient cooperates.  Persistent A-fib Continue anticoagulation Metoprolol  on hold due to soft blood pressure.  History of VTE Continue anticoagulation.  Hypothyroidism  TSH and free T4 stable continue present Synthroid  dose, follow-up with  PCP.  GERD PPI  Code status:   Code Status: Full Code   DVT Prophylaxis:Place TED hose Start: 07/29/24  0935 rivaroxaban  (XARELTO ) tablet 20 mg    Family Communication: Husband updated in detail on 07/30/2024 over the phone   Disposition Plan: Status is: Observation The patient will require care spanning > 2 midnights and should be moved to inpatient because: Severity of illness   Planned Discharge Destination:Home   Diet: Diet Order             DIET SOFT Room service appropriate? Yes; Fluid consistency: Thin  Diet effective now                     Antimicrobial agents: Anti-infectives (From admission, onward)    Start     Dose/Rate Route Frequency Ordered Stop   07/29/24 1000  vancomycin  (VANCOREADY) IVPB 750 mg/150 mL  Status:  Discontinued        750 mg 150 mL/hr over 60 Minutes Intravenous Every 24 hours 07/28/24 1736 07/29/24 0930   07/28/24 1730  meropenem  (MERREM ) 1 g in sodium chloride  0.9 % 100 mL IVPB  Status:  Discontinued        1 g 200 mL/hr over 30 Minutes Intravenous Every 12 hours 07/28/24 1711 07/29/24 0930   07/28/24 1200  vancomycin  (VANCOCIN ) IVPB 1000 mg/200 mL premix        1,000 mg 200 mL/hr over 60 Minutes Intravenous  Once 07/28/24 1152 07/28/24 1319   07/28/24 1200  meropenem  (MERREM ) 1 g in sodium chloride  0.9 % 100 mL IVPB  Status:  Discontinued        1 g 200 mL/hr over 30 Minutes Intravenous  Once 07/28/24 1154 07/28/24 1156   07/28/24 1200  meropenem  (MERREM ) 1 g in sodium chloride  0.9 % 100 mL IVPB        1 g 200 mL/hr over 30 Minutes Intravenous STAT 07/28/24 1156 07/28/24 1238        MEDICATIONS: Scheduled Meds:  midodrine   10 mg Oral 2 times per day   pantoprazole   40 mg Oral Daily   rivaroxaban   20 mg Oral q1800   sodium chloride  flush  3 mL Intravenous Q12H   thiamine   100 mg Oral Daily   Continuous Infusions:   PRN Meds:.acetaminophen  **OR** acetaminophen , calcium  carbonate, trimethobenzamide    I have personally reviewed following labs and imaging studies  LABORATORY DATA: CBC: Recent Labs  Lab 07/28/24 1123  07/28/24 1145 07/29/24 0404 07/31/24 0728  WBC 15.7*  --  9.5 6.6  NEUTROABS 13.6*  --   --  2.2  HGB 14.7 15.3* 10.4* 12.6  HCT 45.1 45.0 31.2* 37.6  MCV 105.1*  --  103.7* 101.3*  PLT 218  --  163 190    Basic Metabolic Panel: Recent Labs  Lab 07/28/24 1123 07/28/24 1145 07/29/24 0404 07/30/24 0542 07/31/24 0728 08/01/24 0552  NA 141 142 138 138 141 136  K 4.1 4.0 3.7 3.7 3.5 4.3  CL 106 106 108 110 106 104  CO2 22  --  22 21* 27 23  GLUCOSE 158* 152* 84 85 87 91  BUN 12 11 11 8  7* 7*  CREATININE 1.06* 0.80 0.76 0.90 0.76 0.71  CALCIUM  9.2  --  8.3* 8.4* 9.1 8.9  MG  --   --   --   --  1.6* 2.2    GFR: Estimated Creatinine Clearance: 42.9 mL/min (by C-G formula based on SCr of 0.71 mg/dL).  Liver Function Tests: Recent Labs  Lab 07/28/24 1123  AST 38  ALT 14  ALKPHOS 76  BILITOT 0.9  PROT 6.4*  ALBUMIN 3.1*    Thyroid  Function Tests: No results for input(s): TSH, T4TOTAL, FREET4, T3FREE, THYROIDAB in the last 72 hours.   Anemia Panel: No results for input(s): VITAMINB12, FOLATE, FERRITIN, TIBC, IRON, RETICCTPCT in the last 72 hours.  Urine analysis:    Component Value Date/Time   COLORURINE YELLOW 07/28/2024 1224   APPEARANCEUR HAZY (A) 07/28/2024 1224   LABSPEC 1.013 07/28/2024 1224   PHURINE 5.0 07/28/2024 1224   GLUCOSEU NEGATIVE 07/28/2024 1224   HGBUR NEGATIVE 07/28/2024 1224   BILIRUBINUR NEGATIVE 07/28/2024 1224   KETONESUR NEGATIVE 07/28/2024 1224   PROTEINUR NEGATIVE 07/28/2024 1224   NITRITE NEGATIVE 07/28/2024 1224   LEUKOCYTESUR NEGATIVE 07/28/2024 1224    Sepsis Labs: Lactic Acid, Venous    Component Value Date/Time   LATICACIDVEN 2.3 (HH) 07/28/2024 2326    MICROBIOLOGY: Recent Results (from the past 240 hours)  Blood culture (routine x 2)     Status: None (Preliminary result)   Collection Time: 07/28/24 11:25 AM   Specimen: BLOOD RIGHT FOREARM  Result Value Ref Range Status   Specimen Description  BLOOD RIGHT FOREARM  Final   Special Requests   Final    BOTTLES DRAWN AEROBIC AND ANAEROBIC Blood Culture adequate volume   Culture   Final    NO GROWTH 4 DAYS Performed at [facility], [address], [place], [place] [address]    Report Status PENDING  Incomplete  Blood culture (routine x 2)     Status: None (Preliminary result)   Collection Time: 07/28/24 11:32 AM   Specimen: BLOOD RIGHT HAND  Result Value Ref Range Status   Specimen Description BLOOD RIGHT HAND  Final   Special Requests   Final    BOTTLES DRAWN AEROBIC AND ANAEROBIC Blood Culture adequate volume   Culture   Final    NO GROWTH 4 DAYS Performed at [facility], [address] [place], [place] [address]    Report Status PENDING  Incomplete  Resp panel by RT-PCR (RSV, Flu A&B, Covid) Anterior Nasal Swab     Status: None   Collection Time: 07/28/24  5:34 PM   Specimen: Anterior Nasal Swab  Result Value Ref Range Status   SARS Coronavirus 2 by RT PCR NEGATIVE NEGATIVE Final   Influenza A by PCR NEGATIVE NEGATIVE Final   Influenza B by PCR NEGATIVE NEGATIVE Final    Comment: (NOTE) The Xpert Xpress SARS-CoV-2/FLU/RSV plus assay is intended as an aid in the diagnosis of influenza from Nasopharyngeal swab specimens and should not be used as a sole basis for treatment. Nasal washings and aspirates are unacceptable for Xpert Xpress SARS-CoV-2/FLU/RSV testing.  Fact Sheet for Patients: BloggerCourse.com  Fact Sheet for Healthcare Providers: SeriousBroker.it  This test is not yet approved or cleared by the [place]  FDA and has been authorized for detection and/or diagnosis of SARS-CoV-2 by FDA under an Emergency Use Authorization (EUA). This EUA will remain in effect (meaning this test can be used) for the duration of the COVID-19 declaration under Section 564(b)(1) of the Act, 21 U.S.C. section 360bbb-3(b)(1), unless the authorization is  terminated or revoked.     Resp Syncytial Virus by PCR NEGATIVE NEGATIVE Final    Comment: (NOTE) Fact Sheet for Patients: BloggerCourse.com  Fact Sheet for Healthcare Providers: SeriousBroker.it  This test is not yet approved or cleared by  the [place]  FDA and has been authorized for detection and/or diagnosis of SARS-CoV-2 by FDA under an Emergency Use Authorization (EUA). This EUA will remain in effect (meaning this test can be used) for the duration of the COVID-19 declaration under Section 564(b)(1) of the Act, 21 U.S.C. section 360bbb-3(b)(1), unless the authorization is terminated or revoked.  Performed at [facility], [address] [place], [place] [address]     RADIOLOGY STUDIES/RESULTS: ECHOCARDIOGRAM COMPLETE Result Date: 07/30/2024    ECHOCARDIOGRAM REPORT   Patient Name:   Stacy Valencia Date of Exam: 07/30/2024 Medical Rec #:  [id number]      Height:       65.0 in Accession #:    [id number]     Weight:       130.1 lb Date of Birth:  02/20/1933      BSA:          1.648 m Patient Age:    89 years       BP:           123/60 mmHg Patient Gender: F              HR:           65 bpm. Exam Location:  Inpatient Procedure: 2D Echo (Both Spectral and Color Flow Doppler were utilized during            procedure). Indications:    syncope  History:        Patient has prior history of Echocardiogram examinations, most                 recent 11/20/2023. Arrythmias:Atrial Fibrillation.  Sonographer:    Tinnie Barefoot RDCS Referring Phys: LEOMA DONALDA M GHIMIRE IMPRESSIONS  1. Left ventricular ejection fraction, by estimation, is 60 to 65%. The left ventricle has normal function. Left ventricular endocardial border not optimally defined to evaluate regional wall motion. Left ventricular diastolic parameters are indeterminate.  2. Right ventricular systolic function is normal. The right ventricular size is normal. There is normal  pulmonary artery systolic pressure.  3. The mitral valve is normal in structure. No evidence of mitral valve regurgitation. No evidence of mitral stenosis.  4. The aortic valve was not well visualized. Aortic valve regurgitation is moderate.  5. The inferior vena cava is normal in size with greater than 50% respiratory variability, suggesting right atrial pressure of 3 mmHg. FINDINGS  Left Ventricle: Left ventricular ejection fraction, by estimation, is 60 to 65%. The left ventricle has normal function. Left ventricular endocardial border not optimally defined to evaluate regional wall motion. The left ventricular internal cavity size was normal in size. There is no left ventricular hypertrophy. Left ventricular diastolic parameters are indeterminate. Right Ventricle: The right ventricular size is normal. Right vetricular wall thickness was not well visualized. Right ventricular systolic function is normal. There is normal pulmonary artery systolic pressure. The tricuspid regurgitant velocity is 2.32 m/s, and with an assumed right atrial pressure of 3 mmHg, the estimated right ventricular systolic pressure is 24.5 mmHg. Left Atrium: Left atrial size was normal in size. Right Atrium: Right atrial size was normal in size. Pericardium: There is no evidence of pericardial effusion. Mitral Valve: The mitral valve is normal in structure. No evidence of mitral valve regurgitation. No evidence of mitral valve stenosis. Tricuspid Valve: The tricuspid valve is normal in structure. Tricuspid valve regurgitation is trivial. No evidence of tricuspid stenosis. Aortic Valve: The aortic valve was not well visualized.  Aortic valve regurgitation is moderate. Aortic regurgitation PHT measures 577 msec. Aortic valve mean gradient measures 3.0 mmHg. Aortic valve peak gradient measures 5.5 mmHg. Aortic valve area, by VTI measures 1.70 cm. Pulmonic Valve: The pulmonic valve was not well visualized. Pulmonic valve regurgitation is not  visualized. No evidence of pulmonic stenosis. Aorta: The aortic root and ascending aorta are structurally normal, with no evidence of dilitation. Venous: The inferior vena cava is normal in size with greater than 50% respiratory variability, suggesting right atrial pressure of 3 mmHg. IAS/Shunts: No atrial level shunt detected by color flow Doppler.  LEFT VENTRICLE PLAX 2D LVIDd:         4.60 cm   Diastology LVIDs:         2.70 cm   LV e' medial:    7.62 cm/s LV PW:         0.80 cm   LV E/e' medial:  8.0 LV IVS:        0.70 cm   LV e' lateral:   7.07 cm/s LVOT diam:     1.90 cm   LV E/e' lateral: 8.6 LV SV:         50 LV SV Index:   30 LVOT Area:     2.84 cm  RIGHT VENTRICLE             IVC RV Basal diam:  3.10 cm     IVC diam: 1.70 cm RV S prime:     10.00 cm/s TAPSE (M-mode): 1.8 cm LEFT ATRIUM           Index        RIGHT ATRIUM           Index LA diam:      3.20 cm 1.94 cm/m   RA Area:     13.00 cm LA Vol (A4C): 35.6 ml 21.61 ml/m  RA Volume:   31.70 ml  19.24 ml/m  AORTIC VALVE AV Area (Vmax):    1.63 cm AV Area (Vmean):   1.45 cm AV Area (VTI):     1.70 cm AV Vmax:           116.73 cm/s AV Vmean:          81.822 cm/s AV VTI:            0.294 m AV Peak Grad:      5.5 mmHg AV Mean Grad:      3.0 mmHg LVOT Vmax:         67.00 cm/s LVOT Vmean:        41.900 cm/s LVOT VTI:          0.176 m LVOT/AV VTI ratio: 0.60 AI PHT:            577 msec  AORTA Ao Root diam: 3.80 cm Ao Asc diam:  3.30 cm MITRAL VALVE               TRICUSPID VALVE MV Area (PHT): 2.62 cm    TR Peak grad:   21.5 mmHg MV Decel Time: 290 msec    TR Vmax:        232.00 cm/s MV E velocity: 60.90 cm/s MV A velocity: 50.10 cm/s  SHUNTS MV E/A ratio:  1.22        Systemic VTI:  0.18 m                            Systemic Diam:  1.90 cm Dorn Ross MD Electronically signed by Dorn Ross MD Signature Date/Time: 07/30/2024/2:44:05 PM    Final      LOS: 3 days   Lavada Stank, MD  [facility]    To contact the attending  provider between 7A-7P or the covering provider during after hours 7P-7A, please log into the web site [email or web address] and access using universal [place] password for that web site. If you do not have the password, please call the hospital operator.  08/01/2024, 9:16 AM

## 2024-08-01 NOTE — [place] FL2 (Signed)
 [place]  MEDICAID FL2 LEVEL OF CARE FORM     IDENTIFICATION  Patient Name: Stacy Valencia Birthdate: [birth year] Sex: female Admission Date (Current Location): 07/28/2024  [facility] and [place] Number:  Producer, television/film/video and Address:  The . [facility], [address], [place], [place] [address]      Provider Number: [id number]  Attending Physician Name and Address:  Dennise Lavada POUR, MD  Relative Name and Phone Number:       Current Level of Care: Hospital Recommended Level of Care: Skilled Nursing Facility Prior Approval Number:    Date Approved/Denied:   PASRR Number: [id number]  Discharge Plan: SNF    Current Diagnoses: Patient Active Problem List   Diagnosis Date Noted   SIRS (systemic inflammatory response syndrome) (HCC) 07/28/2024   Lactic acidosis 07/28/2024   Syncope 07/28/2024   Nausea and vomiting 07/28/2024   Renal insufficiency 07/28/2024   Protein-calorie malnutrition, severe 06/07/2024   Acquired hypothyroidism 06/07/2024   Weakness 06/06/2024   Pressure injury of skin 06/06/2024   History of pulmonary embolism - saddle PE in Jan 2014. on Xarelto  06/06/2024   Chronic anticoagulation - for history of saddle PE in 2014 06/06/2024   PAF (paroxysmal atrial fibrillation) (HCC) 11/19/2023   Transient hypotension 11/19/2023   Urinary tract infection due to ESBL Klebsiella 08/13/2020   Adjustment disorder with anxious mood    GERD (gastroesophageal reflux disease) 01/05/2013    Orientation RESPIRATION BLADDER Height & Weight     Self, Time, Place, Situation  Normal Incontinent, External catheter Weight: 130 lb 1.1 oz (59 kg) Height:  5' 5 (165.1 cm)  BEHAVIORAL SYMPTOMS/MOOD NEUROLOGICAL BOWEL NUTRITION STATUS      Continent Diet (See dc summary)  AMBULATORY STATUS COMMUNICATION OF NEEDS Skin   Extensive Assist Verbally Normal                       Personal Care Assistance Level of Assistance  Bathing, Feeding, Dressing  Bathing Assistance: Maximum assistance Feeding assistance: Limited assistance Dressing Assistance: Limited assistance     Functional Limitations Info             SPECIAL CARE FACTORS FREQUENCY  PT (By licensed PT), OT (By licensed OT)     PT Frequency: 5x/week OT Frequency: 5x/week            Contractures Contractures Info: Not present    Additional Factors Info  Code Status, Allergies, Isolation Precautions Code Status Info: Full Allergies Info: Sulfa Antibiotics     Isolation Precautions Info: ESBL 06/06/24     Current Medications (08/01/2024):  This is the current hospital active medication list Current Facility-Administered Medications  Medication Dose Route Frequency Provider Last Rate Last Admin   acetaminophen  (TYLENOL ) tablet 650 mg  650 mg Oral Q6H PRN Smith, Rondell A, MD       Or   acetaminophen  (TYLENOL ) suppository 650 mg  650 mg Rectal Q6H PRN Smith, Rondell A, MD       calcium  carbonate (TUMS - dosed in mg elemental calcium ) chewable tablet 200-400 mg of elemental calcium   1-2 tablet Oral QID PRN Smith, Rondell A, MD       midodrine  (PROAMATINE ) tablet 10 mg  10 mg Oral 2 times per day Raenelle Donalda HERO, MD   10 mg at 07/31/24 1450   pantoprazole  (PROTONIX ) EC tablet 40 mg  40 mg Oral Daily Pham, Minh Q, RPH-CPP   40 mg at 07/31/24 [phone]  rivaroxaban  (XARELTO ) tablet 20 mg  20 mg Oral q1800 Smith, Rondell A, MD   20 mg at 07/31/24 1754   sodium chloride  flush (NS) 0.9 % injection 3 mL  3 mL Intravenous Q12H Smith, Rondell A, MD   3 mL at 07/31/24 2225   thiamine  (VITAMIN B1) tablet 100 mg  100 mg Oral Daily Raenelle Donalda HERO, MD   100 mg at 07/31/24 9052   trimethobenzamide  (TIGAN ) injection 200 mg  200 mg Intramuscular Q6H PRN Smith, Rondell A, MD         Discharge Medications: Please see discharge summary for a list of discharge medications.  Relevant Imaging Results:  Relevant Lab Results:   Additional Information [id number]  Inocente GORMAN Kindle, LCSW

## 2024-08-01 NOTE — TOC Initial Note (Addendum)
 Transition of Care [facility]) - Initial/Assessment Note    Patient Details  Name: Stacy Valencia MRN: [id number] Date of Birth: [birth year]  Transition of Care [facility]) CM/SW Contact:    Inocente GORMAN Kindle, LCSW Phone Number: 08/01/2024, 12:34 PM  Clinical Narrative:                 Patient spouse requesting patient return to [place].   CSW confirmed with Karrin that patient recently discharge from there about two weeks ago (first went on 06/10/24). Tanya with [facility] confirmed they can accept patient tomorrow if stable.   CSW updated spouse. He requested PTAR for transport.   Skilled Nursing Rehab Facilities-   [email or web address]   Ratings out of 5 stars (5 the highest)  Name Address  Phone # Quality Care Staffing Health Inspection Overall  [facility] [address] [phone] 3 1 4 3   [facility] [address], [place] [phone] 5 2 4 5   [facility] [address], [facility] [phone] 2 1 1 1   [facility] [address], [place] [phone] 4 3 4 4   [facility]  [address], [place] [phone] 5 3 5 5   [facility] [address], [facility] [phone] 5 3 2 3   [facility] [address] [place] [phone] 5 1 2 2   [facility] [address], [place] [phone] 2 3 4 4   [address] ([place]) [address] [place] [phone] 1 3 3 2   [facility] [address] [place], [place] [phone] 3 1 2 1   [facility] ([place]) [address], [place] [phone] 3 1 1 1   Clotilda Pereyra [address] [place] [phone] 3 3 4 4   [facility] [address], [place] [phone] 4 1 2 1   [facility] ([facility]) [address], [place] [phone] 1 1 4 2           [facility] [address], [place] [phone] 3 1 5 4   [facility] [address]  [place] [phone] 4 1 1 1   [facility]  [address] [place] [phone] 2 3 1 1   [facility] [place] [address] [phone] 2 2 4 4   [facility] [address] [place] [address], [place] [phone] 2 2 3 3           [facility] [address], [place] [phone] 2 1 2 1   [facility] (No U[facility]) [address], [place] [phone] 5 4 4 5   [facility] [address] [facility] [phone] 3 4 4 4   [facility] [address], [place] [phone] 3 1 2 1   [place] [address] [place] [phone] 3 2 2 2   [facility] [address] [place] [phone] 1 3 3 2   [facility] [address], [place] [phone] 2 2 3 3   [facility] [address] [place], [place] [phone] 2 1 4 3   [facility] [address], [facility] [phone] 2 1 1 1   [facility] [address] [place], [place] [phone] 2 1 2 1   [facility] [address] [place] [phone] 3 1 2 1           [facility] [address], [place] [phone] 4 1 2 1   [facility] [address], [place]  [phone] 3 4 4 4   [facility] (No Humana) [address], [place]  [phone] 3 2 5 5   [facility]) [address], [place] [phone] 2 2 3 3   [facility] [place] [address] [place] [phone] 5 3 5 5   [facility] [address], [place] [phone] 2 1 1 1   [facility] [address] [place], [place] [phone] 3 5 5 5           [facility] [address] [place], [place] [phone] 5 4 5 5   [facility])  [address] [place] [phone] 1 1 2 1   [facility]) [address], [place] [phone]  2 4 4   [facility] [address] [place] [phone] 3 5 5 5   [address] [place], [place] [phone] 4 2 2 2   [facility]) [address] [place] [phone] 1 1 3 1   [facility] [address], [place] [phone] 2 2 2 2      Expected Discharge Plan: Skilled Nursing Facility Barriers to Discharge: Continued Medical Work up, SNF Pending bed offer   Patient Goals and CMS Choice Patient states their goals for this hospitalization and ongoing recovery are:: Rehab CMS Medicare.gov Compare Post Acute Care list provided to:: Patient Represenative (must comment) Choice offered to / list presented to : Spouse [place] ownership interest in [facility].provided to:: Spouse    Expected Discharge Plan and Services In-house Referral: Clinical Social Work     Living arrangements for the past 2 months: Single Family Home                                      Prior Living Arrangements/Services Living arrangements for the past 2 months: Single Family Home Lives with:: Spouse Patient language and need for interpreter reviewed:: Yes Do you feel safe going back to the place where you live?: Yes      Need for Family Participation in Patient Care: Yes (Comment) Care giver support system in place?: Yes (comment) Current home services: DME (walker, wheelchair) Criminal Activity/Legal Involvement Pertinent to Current Situation/Hospitalization: No - Comment as needed  Activities of Daily Living   ADL Screening (condition at time of admission) Independently performs ADLs?: Yes (appropriate for developmental age) Is the patient deaf or have difficulty hearing?: No Does the patient have difficulty seeing, even when wearing glasses/contacts?: No Does the patient have difficulty concentrating, remembering, or making decisions?: No  Permission Sought/Granted Permission sought to share information with : Facility Medical sales representative, Family Supports Permission granted to share information with : Yes, Verbal Permission Granted  Share Information with NAME:  Burback,[name] -Spouse   [phone]  Permission granted to share info w AGENCY: SNFs        Emotional Assessment Appearance:: Appears stated age Attitude/Demeanor/Rapport: Engaged Affect (typically observed): Accepting Orientation: : Oriented to Self, Oriented to Place, Oriented to  Time, Oriented to Situation Alcohol / Substance Use: Not Applicable Psych Involvement: No (comment)  Admission diagnosis:  SIRS (systemic inflammatory response syndrome) (HCC) [R65.10] Severe sepsis (HCC) [A41.9, R65.20] Patient Active Problem List   Diagnosis Date Noted   SIRS (systemic inflammatory response syndrome) (HCC) 07/28/2024   Lactic acidosis 07/28/2024   Syncope 07/28/2024   Nausea and vomiting 07/28/2024   Renal insufficiency 07/28/2024   Protein-calorie malnutrition, severe 06/07/2024   Acquired hypothyroidism 06/07/2024   Weakness 06/06/2024   Pressure injury of skin  06/06/2024   History of pulmonary embolism - saddle PE in Jan 2014. on Xarelto  06/06/2024   Chronic anticoagulation - for history of saddle PE in 2014 06/06/2024   PAF (paroxysmal atrial fibrillation) (HCC) 11/19/2023   Transient hypotension 11/19/2023   Urinary tract infection due to ESBL Klebsiella 08/13/2020   Adjustment disorder with anxious mood    GERD (gastroesophageal reflux disease) 01/05/2013   PCP:  Kip Righter, MD Pharmacy:   [phone] [place], [place] - [address] [place] [address] Phone: [phone] Fax: [phone]     Social Drivers of Health (SDOH) Social History: SDOH Screenings   Food Insecurity: No Food Insecurity (07/28/2024)  Housing: Low Risk  (07/28/2024)  Transportation Needs: No Transportation Needs (07/28/2024)  Utilities: Not At Risk (07/28/2024)  Social Connections: Socially Integrated (07/28/2024)  Tobacco Use: Low Risk  (07/28/2024)   SDOH Interventions:     Readmission Risk Interventions    04/14/2024    2:39 PM  Readmission Risk  Prevention Plan  Post Dischage Appt Complete  Medication Screening Complete  Transportation Screening Complete

## 2024-08-02 DIAGNOSIS — R6510 Systemic inflammatory response syndrome (SIRS) of non-infectious origin without acute organ dysfunction: Secondary | ICD-10-CM | POA: Diagnosis not present

## 2024-08-02 LAB — BASIC METABOLIC PANEL WITH GFR
Anion gap: 10 (ref 5–15)
BUN: 11 mg/dL (ref 8–23)
CO2: 23 mmol/L (ref 22–32)
Calcium: 9.2 mg/dL (ref 8.9–10.3)
Chloride: 104 mmol/L (ref 98–111)
Creatinine, Ser: 0.82 mg/dL (ref 0.44–1.00)
GFR, Estimated: >60 mL/min (ref >60)
Glucose, Bld: 83 mg/dL (ref 70–99)
Potassium: 4.3 mmol/L (ref 3.5–5.1)
Sodium: 137 mmol/L (ref 135–145)

## 2024-08-02 LAB — CULTURE, BLOOD (ROUTINE X 2)
Culture: NO GROWTH
Culture: NO GROWTH
Special Requests: ADEQUATE
Special Requests: ADEQUATE

## 2024-08-02 LAB — MAGNESIUM: Magnesium: 1.8 mg/dL (ref 1.7–2.4)

## 2024-08-02 MED ORDER — LACTATED RINGERS IV BOLUS
500.0000 mL | Freq: Once | INTRAVENOUS | Status: AC
  Administered 2024-08-02 (×2): 500 mL via INTRAVENOUS

## 2024-08-02 NOTE — Plan of Care (Signed)

## 2024-08-02 NOTE — Progress Notes (Signed)
 Physical Therapy Treatment Patient Details Name: Stacy Valencia MRN: [id number] DOB: Oct 30, 1933 Today's Date: 08/02/2024   History of Present Illness Patient is a 89 y.o.  female presents with weakness after slumping in the bathroom. Past medical history significant of atrial fibrillation, diastolic CHF, and DVT/PE on chronic anticoagulation.    PT Comments  Pt with poor tolerance to treatment today. Pt requiring Mod A to sit EOB however once seated EOB pt HR jumped to 146. Pt immediately requesting to lay back down and declining further mobility. No change in DC/DME recs at this time. PT will continue to follow.     If plan is discharge home, recommend the following: Two people to help with walking and/or transfers;A lot of help with bathing/dressing/bathroom;Assistance with cooking/housework;Direct supervision/assist for medications management;Assist for transportation;Help with stairs or ramp for entrance;Supervision due to cognitive status   Can travel by private vehicle     No  Equipment Recommendations  Hospital bed;Hoyer lift    Recommendations for Other Services       Precautions / Restrictions Precautions Precautions: Fall Recall of Precautions/Restrictions: Impaired Restrictions Weight Bearing Restrictions Per Provider Order: No     Mobility  Bed Mobility Overal bed mobility: Needs Assistance Bed Mobility: Supine to Sit, Sit to Supine     Supine to sit: Mod assist Sit to supine: Mod assist   General bed mobility comments: assist to raise trunk and for LEs back into bed. Once seated EOB HR up to 146 and pt requesting to lay back down.    Transfers                   General transfer comment: Pt declined    Ambulation/Gait                   Stairs             Wheelchair Mobility     Tilt Bed    Modified Rankin (Stroke Patients Only)       Balance Overall balance assessment: Needs assistance Sitting-balance support: Bilateral  upper extremity supported, Feet supported Sitting balance-Leahy Scale: Good                                      Communication Communication Communication: Impaired Factors Affecting Communication: Hearing impaired  Cognition Arousal: Alert Behavior During Therapy: WFL for tasks assessed/performed   PT - Cognitive impairments: Awareness                       PT - Cognition Comments: Pt appeared to be A&Ox3 however stated that she turns 89 this weekend despite her birthday being in [birth year]. Following commands: Intact      Cueing Cueing Techniques: Verbal cues, Gestural cues  Exercises      General Comments General comments (skin integrity, edema, etc.): HR up to 146 once seated EOB.      Pertinent Vitals/Pain Pain Assessment Pain Assessment: No/denies pain    Home Living                          Prior Function            PT Goals (current goals can now be found in the care plan section) Progress towards PT goals: Progressing toward goals    Frequency    Min 2X/week  PT Plan      Co-evaluation              AM-PAC PT 6 Clicks Mobility   Outcome Measure  Help needed turning from your back to your side while in a flat bed without using bedrails?: None Help needed moving from lying on your back to sitting on the side of a flat bed without using bedrails?: A Lot Help needed moving to and from a bed to a chair (including a wheelchair)?: A Lot Help needed standing up from a chair using your arms (e.g., wheelchair or bedside chair)?: A Lot Help needed to walk in hospital room?: Total Help needed climbing 3-5 steps with a railing? : Total 6 Click Score: 12    End of Session Equipment Utilized During Treatment: Gait belt Activity Tolerance: Patient limited by fatigue;Other (comment) (HR response) Patient left: in bed;with call bell/phone within reach;with bed alarm set Nurse Communication: Mobility status PT Visit  Diagnosis: Other abnormalities of gait and mobility (R26.89)     Time: 8555-8546 PT Time Calculation (min) (ACUTE ONLY): 9 min  Charges:    $Therapeutic Activity: 8-22 mins PT General Charges $$ ACUTE PT VISIT: 1 Visit                     Sueellen NOVAK, PT, DPT Acute Rehab Services [phone]    Bhakti Labella 08/02/2024, 3:17 PM

## 2024-08-02 NOTE — Progress Notes (Signed)
 PROGRESS NOTE        PATIENT DETAILS Name: Stacy Valencia Age: 89 y.o. Sex: female Date of Birth: [birth year] Admit Date: 07/28/2024 Admitting Physician Maximino DELENA Sharps, MD ERE:Fnmmnt, Beverley, MD  Brief Summary: Patient is a 89 y.o.  female with history of A-fib, HFpEF, VTE, chronic hypotension on midodrine -who had nausea/vomiting for 1-2 days-and had a episode of syncope/presyncope on day of admission associated with hypotension (BP 70/42 per H&P-by EMS).  Patient was brought to the ED and subsequently admitted to the hospitalist service.  Significant events: 8/7>> admit to [facility]  Significant studies: 8/7>>CXR: No PNA 8/7>> CT head: No acute intracranial abnormality.  Significant microbiology data: 8/7>> COVID/influenza/RSV PCR: Negative 8/7>> blood culture: No growth.  Procedures: TTE   1. Left ventricular ejection fraction, by estimation, is 60 to 65%. The  left ventricle has normal function. Left ventricular endocardial border  not optimally defined to evaluate regional wall motion. Left ventricular  diastolic parameters are  indeterminate.   2. Right ventricular systolic function is normal. The right ventricular  size is normal. There is normal pulmonary artery systolic pressure.   3. The mitral valve is normal in structure. No evidence of mitral valve  regurgitation. No evidence of mitral stenosis.   4. The aortic valve was not well visualized. Aortic valve regurgitation  is moderate.   5. The inferior vena cava is normal in size with greater than 50%  respiratory variability, suggesting right atrial pressure of 3 mmHg.   Consults: None  Subjective:   Patient in bed, appears comfortable, denies any headache, no fever, no chest pain or pressure, no shortness of breath , no abdominal pain. No new focal weakness.  Objective: Vitals: Blood pressure (!) 107/52, pulse 71, temperature 97.6 F (36.4 C), temperature source Oral, resp. rate 20, height  5' 5 (1.651 m), weight 59 kg, SpO2 94%.   Exam:  Awake Alert, No new F.N deficits, Normal affect Achille.AT,PERRAL Supple Neck, No JVD,   Symmetrical Chest wall movement, Good air movement bilaterally, CTAB RRR,No Gallops, Rubs or new Murmurs,  +ve B.Sounds, Abd Soft, No tenderness,   No Cyanosis, Clubbing or edema     Assessment/Plan: Syncope versus presyncope Unclear if she actually lost consciousness-patient denies losing consciousness-per H&P-husband relates that the patient did lose consciousness for a few minutes. Etiology likely secondary to hypotension due to nausea/vomiting (EMS reports BP 70/42 and route to ED), continue hydration with IV fluids, Telemetry-negative so far Echocardiogram nonacute BP stabilizing-stop IV fluids-continue midodrine , TSH and free T4 stable, check random cortisol as well. Mobilize with PT/OT/check orthostatics.  Patient qualifies for SNF, husband wants to take her to SNF as well await bed for placement.  Nausea/vomiting Probably viral syndrome Has resolved with supportive care As needed antiemetics.  SIRS Probably secondary to viral gastroenteritis No evidence of UTI on UA Blood cultures negative so far Suspect we can stop antibiotics and monitor.   Chronic hypotension/orthostatic hypotension Chronic issue Likely worsened due to GI illness/nausea/vomiting-some issues with intermittent hypotension overnight-will hydrate further with IV fluids today. Prior a.m. cortisol November-stable at 15.2 On midodrine -will increase dosage to 10 mg 3 times daily. Placed TED stockings/abdominal binder if able/patient cooperates.  Persistent A-fib Continue anticoagulation Metoprolol  on hold due to soft blood pressure.  History of VTE Continue anticoagulation.  Hypothyroidism  TSH and free T4 stable continue present Synthroid  dose,  follow-up with PCP.  GERD PPI  Code status:   Code Status: Full Code   DVT Prophylaxis:Place TED hose Start:  07/29/24 0935 rivaroxaban  (XARELTO ) tablet 20 mg    Family Communication: Husband updated in detail on 07/30/2024 over the phone   Disposition Plan: Status is: Observation The patient will require care spanning > 2 midnights and should be moved to inpatient because: Severity of illness   Planned Discharge Destination:Home   Diet: Diet Order             DIET SOFT Room service appropriate? Yes; Fluid consistency: Thin  Diet effective now                     Antimicrobial agents: Anti-infectives (From admission, onward)    Start     Dose/Rate Route Frequency Ordered Stop   07/29/24 1000  vancomycin  (VANCOREADY) IVPB 750 mg/150 mL  Status:  Discontinued        750 mg 150 mL/hr over 60 Minutes Intravenous Every 24 hours 07/28/24 1736 07/29/24 0930   07/28/24 1730  meropenem  (MERREM ) 1 g in sodium chloride  0.9 % 100 mL IVPB  Status:  Discontinued        1 g 200 mL/hr over 30 Minutes Intravenous Every 12 hours 07/28/24 1711 07/29/24 0930   07/28/24 1200  vancomycin  (VANCOCIN ) IVPB 1000 mg/200 mL premix        1,000 mg 200 mL/hr over 60 Minutes Intravenous  Once 07/28/24 1152 07/28/24 1319   07/28/24 1200  meropenem  (MERREM ) 1 g in sodium chloride  0.9 % 100 mL IVPB  Status:  Discontinued        1 g 200 mL/hr over 30 Minutes Intravenous  Once 07/28/24 1154 07/28/24 1156   07/28/24 1200  meropenem  (MERREM ) 1 g in sodium chloride  0.9 % 100 mL IVPB        1 g 200 mL/hr over 30 Minutes Intravenous STAT 07/28/24 1156 07/28/24 1238        MEDICATIONS: Scheduled Meds:  midodrine   10 mg Oral 2 times per day   pantoprazole   40 mg Oral Daily   rivaroxaban   20 mg Oral q1800   sodium chloride  flush  3 mL Intravenous Q12H   thiamine   100 mg Oral Daily   Continuous Infusions:   PRN Meds:.acetaminophen  **OR** acetaminophen , calcium  carbonate, trimethobenzamide    I have personally reviewed following labs and imaging studies  LABORATORY DATA: CBC: Recent Labs  Lab  07/28/24 1123 07/28/24 1145 07/29/24 0404 07/31/24 0728  WBC 15.7*  --  9.5 6.6  NEUTROABS 13.6*  --   --  2.2  HGB 14.7 15.3* 10.4* 12.6  HCT 45.1 45.0 31.2* 37.6  MCV 105.1*  --  103.7* 101.3*  PLT 218  --  163 190    Basic Metabolic Panel: Recent Labs  Lab 07/29/24 0404 07/30/24 0542 07/31/24 0728 08/01/24 0552 08/02/24 0513  NA 138 138 141 136 137  K 3.7 3.7 3.5 4.3 4.3  CL 108 110 106 104 104  CO2 22 21* 27 23 23   GLUCOSE 84 85 87 91 83  BUN 11 8 7* 7* 11  CREATININE 0.76 0.90 0.76 0.71 0.82  CALCIUM  8.3* 8.4* 9.1 8.9 9.2  MG  --   --  1.6* 2.2 1.8    GFR: Estimated Creatinine Clearance: 41.9 mL/min (by C-G formula based on SCr of 0.82 mg/dL).  Liver Function Tests: Recent Labs  Lab 07/28/24 1123  AST 38  ALT 14  ALKPHOS  76  BILITOT 0.9  PROT 6.4*  ALBUMIN 3.1*    Thyroid  Function Tests: No results for input(s): TSH, T4TOTAL, FREET4, T3FREE, THYROIDAB in the last 72 hours.   Anemia Panel: No results for input(s): VITAMINB12, FOLATE, FERRITIN, TIBC, IRON, RETICCTPCT in the last 72 hours.  Urine analysis:    Component Value Date/Time   COLORURINE YELLOW 07/28/2024 1224   APPEARANCEUR HAZY (A) 07/28/2024 1224   LABSPEC 1.013 07/28/2024 1224   PHURINE 5.0 07/28/2024 1224   GLUCOSEU NEGATIVE 07/28/2024 1224   HGBUR NEGATIVE 07/28/2024 1224   BILIRUBINUR NEGATIVE 07/28/2024 1224   KETONESUR NEGATIVE 07/28/2024 1224   PROTEINUR NEGATIVE 07/28/2024 1224   NITRITE NEGATIVE 07/28/2024 1224   LEUKOCYTESUR NEGATIVE 07/28/2024 1224    Sepsis Labs: Lactic Acid, Venous    Component Value Date/Time   LATICACIDVEN 2.3 (HH) 07/28/2024 2326    MICROBIOLOGY: Recent Results (from the past 240 hours)  Blood culture (routine x 2)     Status: None   Collection Time: 07/28/24 11:25 AM   Specimen: BLOOD RIGHT FOREARM  Result Value Ref Range Status   Specimen Description BLOOD RIGHT FOREARM  Final   Special Requests   Final     BOTTLES DRAWN AEROBIC AND ANAEROBIC Blood Culture adequate volume   Culture   Final    NO GROWTH 5 DAYS Performed at [facility], [address], [place], [place] [address]    Report Status 08/02/2024 FINAL  Final  Blood culture (routine x 2)     Status: None   Collection Time: 07/28/24 11:32 AM   Specimen: BLOOD RIGHT HAND  Result Value Ref Range Status   Specimen Description BLOOD RIGHT HAND  Final   Special Requests   Final    BOTTLES DRAWN AEROBIC AND ANAEROBIC Blood Culture adequate volume   Culture   Final    NO GROWTH 5 DAYS Performed at [facility], [address] [place], [place] [address]    Report Status 08/02/2024 FINAL  Final  Resp panel by RT-PCR (RSV, Flu A&B, Covid) Anterior Nasal Swab     Status: None   Collection Time: 07/28/24  5:34 PM   Specimen: Anterior Nasal Swab  Result Value Ref Range Status   SARS Coronavirus 2 by RT PCR NEGATIVE NEGATIVE Final   Influenza A by PCR NEGATIVE NEGATIVE Final   Influenza B by PCR NEGATIVE NEGATIVE Final    Comment: (NOTE) The Xpert Xpress SARS-CoV-2/FLU/RSV plus assay is intended as an aid in the diagnosis of influenza from Nasopharyngeal swab specimens and should not be used as a sole basis for treatment. Nasal washings and aspirates are unacceptable for Xpert Xpress SARS-CoV-2/FLU/RSV testing.  Fact Sheet for Patients: BloggerCourse.com  Fact Sheet for Healthcare Providers: SeriousBroker.it  This test is not yet approved or cleared by the [place]  FDA and has been authorized for detection and/or diagnosis of SARS-CoV-2 by FDA under an Emergency Use Authorization (EUA). This EUA will remain in effect (meaning this test can be used) for the duration of the COVID-19 declaration under Section 564(b)(1) of the Act, 21 U.S.C. section 360bbb-3(b)(1), unless the authorization is terminated or revoked.     Resp Syncytial Virus by PCR NEGATIVE NEGATIVE  Final    Comment: (NOTE) Fact Sheet for Patients: BloggerCourse.com  Fact Sheet for Healthcare Providers: SeriousBroker.it  This test is not yet approved or cleared by the [place]  FDA and has been authorized for detection and/or diagnosis of SARS-CoV-2 by FDA under an Emergency  Use Authorization (EUA). This EUA will remain in effect (meaning this test can be used) for the duration of the COVID-19 declaration under Section 564(b)(1) of the Act, 21 U.S.C. section 360bbb-3(b)(1), unless the authorization is terminated or revoked.  Performed at [facility], [address] [place], [place] [address]     RADIOLOGY STUDIES/RESULTS: No results found.    LOS: 4 days   Lavada Stank, MD  [facility]    To contact the attending provider between 7A-7P or the covering provider during after hours 7P-7A, please log into the web site [email or web address] and access using universal [place] password for that web site. If you do not have the password, please call the hospital operator.  08/02/2024, 8:24 AM

## 2024-08-03 DIAGNOSIS — K219 Gastro-esophageal reflux disease without esophagitis: Secondary | ICD-10-CM | POA: Diagnosis not present

## 2024-08-03 DIAGNOSIS — Z86711 Personal history of pulmonary embolism: Secondary | ICD-10-CM | POA: Diagnosis not present

## 2024-08-03 DIAGNOSIS — R55 Syncope and collapse: Secondary | ICD-10-CM | POA: Diagnosis not present

## 2024-08-03 DIAGNOSIS — I2699 Other pulmonary embolism without acute cor pulmonale: Secondary | ICD-10-CM | POA: Diagnosis not present

## 2024-08-03 DIAGNOSIS — M6259 Muscle wasting and atrophy, not elsewhere classified, multiple sites: Secondary | ICD-10-CM | POA: Diagnosis not present

## 2024-08-03 DIAGNOSIS — I700 Atherosclerosis of aorta: Secondary | ICD-10-CM | POA: Diagnosis not present

## 2024-08-03 DIAGNOSIS — R41841 Cognitive communication deficit: Secondary | ICD-10-CM | POA: Diagnosis not present

## 2024-08-03 DIAGNOSIS — E872 Acidosis, unspecified: Secondary | ICD-10-CM | POA: Diagnosis not present

## 2024-08-03 DIAGNOSIS — F4322 Adjustment disorder with anxiety: Secondary | ICD-10-CM | POA: Diagnosis not present

## 2024-08-03 DIAGNOSIS — Z7401 Bed confinement status: Secondary | ICD-10-CM | POA: Diagnosis not present

## 2024-08-03 DIAGNOSIS — M6281 Muscle weakness (generalized): Secondary | ICD-10-CM | POA: Diagnosis not present

## 2024-08-03 DIAGNOSIS — Z8719 Personal history of other diseases of the digestive system: Secondary | ICD-10-CM | POA: Diagnosis not present

## 2024-08-03 DIAGNOSIS — M4856XD Collapsed vertebra, not elsewhere classified, lumbar region, subsequent encounter for fracture with routine healing: Secondary | ICD-10-CM | POA: Diagnosis not present

## 2024-08-03 DIAGNOSIS — R2689 Other abnormalities of gait and mobility: Secondary | ICD-10-CM | POA: Diagnosis not present

## 2024-08-03 DIAGNOSIS — I2510 Atherosclerotic heart disease of native coronary artery without angina pectoris: Secondary | ICD-10-CM | POA: Diagnosis not present

## 2024-08-03 DIAGNOSIS — E039 Hypothyroidism, unspecified: Secondary | ICD-10-CM | POA: Diagnosis not present

## 2024-08-03 DIAGNOSIS — Z9181 History of falling: Secondary | ICD-10-CM | POA: Diagnosis not present

## 2024-08-03 DIAGNOSIS — M4854XD Collapsed vertebra, not elsewhere classified, thoracic region, subsequent encounter for fracture with routine healing: Secondary | ICD-10-CM | POA: Diagnosis not present

## 2024-08-03 DIAGNOSIS — Z87440 Personal history of urinary (tract) infections: Secondary | ICD-10-CM | POA: Diagnosis not present

## 2024-08-03 DIAGNOSIS — I9589 Other hypotension: Secondary | ICD-10-CM | POA: Diagnosis not present

## 2024-08-03 DIAGNOSIS — I480 Paroxysmal atrial fibrillation: Secondary | ICD-10-CM | POA: Diagnosis not present

## 2024-08-03 DIAGNOSIS — R531 Weakness: Secondary | ICD-10-CM | POA: Diagnosis not present

## 2024-08-03 DIAGNOSIS — Z86718 Personal history of other venous thrombosis and embolism: Secondary | ICD-10-CM | POA: Diagnosis not present

## 2024-08-03 DIAGNOSIS — Z87891 Personal history of nicotine dependence: Secondary | ICD-10-CM | POA: Diagnosis not present

## 2024-08-03 DIAGNOSIS — I959 Hypotension, unspecified: Secondary | ICD-10-CM | POA: Diagnosis not present

## 2024-08-03 DIAGNOSIS — F32A Depression, unspecified: Secondary | ICD-10-CM | POA: Diagnosis not present

## 2024-08-03 DIAGNOSIS — Z7901 Long term (current) use of anticoagulants: Secondary | ICD-10-CM | POA: Diagnosis not present

## 2024-08-03 DIAGNOSIS — E43 Unspecified severe protein-calorie malnutrition: Secondary | ICD-10-CM | POA: Diagnosis not present

## 2024-08-03 DIAGNOSIS — R296 Repeated falls: Secondary | ICD-10-CM | POA: Diagnosis not present

## 2024-08-03 LAB — BASIC METABOLIC PANEL WITH GFR
Anion gap: 8 (ref 5–15)
BUN: 11 mg/dL (ref 8–23)
CO2: 25 mmol/L (ref 22–32)
Calcium: 9.3 mg/dL (ref 8.9–10.3)
Chloride: 105 mmol/L (ref 98–111)
Creatinine, Ser: 0.85 mg/dL (ref 0.44–1.00)
GFR, Estimated: >60 mL/min (ref >60)
Glucose, Bld: 90 mg/dL (ref 70–99)
Potassium: 3.9 mmol/L (ref 3.5–5.1)
Sodium: 138 mmol/L (ref 135–145)

## 2024-08-03 LAB — MAGNESIUM: Magnesium: 1.7 mg/dL (ref 1.7–2.4)

## 2024-08-03 MED ORDER — PANTOPRAZOLE SODIUM 40 MG PO TBEC: 40.0000 mg | DELAYED_RELEASE_TABLET | Freq: Every day | ORAL | Status: DC

## 2024-08-03 MED ORDER — MIDODRINE HCL 10 MG PO TABS: 10.0000 mg | ORAL_TABLET | Freq: Two times a day (BID) | ORAL | Status: DC

## 2024-08-03 NOTE — Discharge Instructions (Signed)
 Follow with Primary MD Kip Righter, MD in 7 days   Get CBC, CMP, Magnesium , 2 view Chest X ray -  checked next visit with your primary MD or SNF MD     Activity: As tolerated with Full fall precautions use walker/cane & assistance as needed  Disposition SNF  Diet: Heart Healthy  with feeding assistance and aspiration precautions.   Special Instructions: If you have smoked or chewed Tobacco  in the last 2 yrs please stop smoking, stop any regular Alcohol  and or any Recreational drug use.  On your next visit with your primary care physician please Get Medicines reviewed and adjusted.  Please request your Prim.MD to go over all Hospital Tests and Procedure/Radiological results at the follow up, please get all Hospital records sent to your Prim MD by signing hospital release before you go home.  If you experience worsening of your admission symptoms, develop shortness of breath, life threatening emergency, suicidal or homicidal thoughts you must seek medical attention immediately by calling 911 or calling your MD immediately  if symptoms less severe.  You Must read complete instructions/literature along with all the possible adverse reactions/side effects for all the Medicines you take and that have been prescribed to you. Take any new Medicines after you have completely understood and accpet all the possible adverse reactions/side effects.   Do not drive when taking Pain medications.  Do not take more than prescribed Pain, Sleep and Anxiety Medications  Wear Seat belts while driving.

## 2024-08-03 NOTE — Discharge Summary (Signed)
 Stacy Valencia FMW:[id number] DOB: 1933-02-15 DOA: 07/28/2024  PCP: Kip Righter, MD  Admit date: 07/28/2024  Discharge date: 08/03/2024  Admitted From: Home   Disposition:  SNF   Recommendations for Outpatient Follow-up:   Follow up with PCP in 1-2 weeks  PCP Please obtain BMP/CBC, 2 view CXR in 1week,  (see Discharge instructions)   PCP Please follow up on the following pending results:    Home Health: None   Equipment/Devices: None  Consultations: None  Discharge Condition: Stable     CODE STATUS: Full     Diet Recommendation: Heart Healthy      Chief Complaint  Patient presents with   Loss of Consciousness     Brief history of present illness from the day of admission and additional interim summary    89 y.o.  female with history of A-fib, HFpEF, VTE, chronic hypotension on midodrine -who had nausea/vomiting for 1-2 days-and had a episode of syncope/presyncope on day of admission associated with hypotension (BP 70/42 per H&P-by EMS).  Patient was brought to the ED and subsequently admitted to the hospitalist service.   Significant events: 8/7>> admit to [facility]   Significant studies: 8/7>>CXR: No PNA 8/7>> CT head: No acute intracranial abnormality.   Significant microbiology data: 8/7>> COVID/influenza/RSV PCR: Negative 8/7>> blood culture: No growth.   Procedures: TTE    1. Left ventricular ejection fraction, by estimation, is 60 to 65%. The  left ventricle has normal function. Left ventricular endocardial border  not optimally defined to evaluate regional wall motion. Left ventricular  diastolic parameters are  indeterminate.   2. Right ventricular systolic function is normal. The right ventricular  size is normal. There is normal pulmonary artery systolic pressure.   3. The mitral valve is  normal in structure. No evidence of mitral valve  regurgitation. No evidence of mitral stenosis.   4. The aortic valve was not well visualized. Aortic valve regurgitation  is moderate.   5. The inferior vena cava is normal in size with greater than 50%  respiratory variability, suggesting right atrial pressure of 3 mmHg.                                                                  Hospital Course   Syncope versus presyncope Unclear if she actually lost consciousness-patient denies losing consciousness-per H&P-husband relates that the patient did lose consciousness for a few minutes. Etiology likely secondary to hypotension due to nausea/vomiting (EMS reports BP 70/42 and route to ED), continue hydration with IV fluids, Telemetry-negative   Echocardiogram nonacute BP is now stable on midodrine  present dose, has been adequately hydrated, able TSH, free T4 and random cortisol Continue PT OT at SNF  Nausea/vomiting Probably viral syndrome Dissolved   SIRS Probably secondary to viral gastroenteritis Resolved   Chronic  hypotension/orthostatic hypotension Chronic issue Likely worsened due to GI illness/nausea/vomiting-some issues with intermittent hypotension overnight-will hydrate further with IV fluids today. Prior a.m. cortisol November-stable at 15.2 Stable on midodrine  after hydration continue present dose, TED stockings when out of bed.     Persistent A-fib Continue anticoagulation Blood pressure has improved low-dose beta-blocker continued   History of VTE Continue anticoagulation.   Hypothyroidism   TSH and free T4 stable continue present Synthroid  dose, follow-up with PCP.   GERD PPI    Discharge diagnosis     Active Problems:   SIRS (systemic inflammatory response syndrome) (HCC)   Lactic acidosis   Transient hypotension   Syncope   Nausea and vomiting   Renal insufficiency   PAF (paroxysmal atrial fibrillation) (HCC)   History of pulmonary embolism -  saddle PE in Jan 2014. on Xarelto    Chronic anticoagulation - for history of saddle PE in 2014   Weakness   GERD (gastroesophageal reflux disease)    Discharge instructions    Discharge Instructions     Diet - low sodium heart healthy   Complete by: As directed    Discharge instructions   Complete by: As directed    Follow with Primary MD Kip Righter, MD in 7 days   Get CBC, CMP, Magnesium , 2 view Chest X ray -  checked next visit with your primary MD or SNF MD     Activity: As tolerated with Full fall precautions use walker/cane & assistance as needed  Disposition SNF  Diet: Heart Healthy  with feeding assistance and aspiration precautions.   Special Instructions: If you have smoked or chewed Tobacco  in the last 2 yrs please stop smoking, stop any regular Alcohol  and or any Recreational drug use.  On your next visit with your primary care physician please Get Medicines reviewed and adjusted.  Please request your Prim.MD to go over all Hospital Tests and Procedure/Radiological results at the follow up, please get all Hospital records sent to your Prim MD by signing hospital release before you go home.  If you experience worsening of your admission symptoms, develop shortness of breath, life threatening emergency, suicidal or homicidal thoughts you must seek medical attention immediately by calling 911 or calling your MD immediately  if symptoms less severe.  You Must read complete instructions/literature along with all the possible adverse reactions/side effects for all the Medicines you take and that have been prescribed to you. Take any new Medicines after you have completely understood and accpet all the possible adverse reactions/side effects.   Do not drive when taking Pain medications.  Do not take more than prescribed Pain, Sleep and Anxiety Medications  Wear Seat belts while driving.   Increase activity slowly   Complete by: As directed        Discharge  Medications   Allergies as of 08/03/2024       Reactions   Sulfa Antibiotics Nausea And Vomiting, Other (See Comments)   Bad reaction as a teenager- only occurred with a tablet/oral formulation        Medication List     STOP taking these medications    Gerhardt's butt cream Crea   hydrOXYzine  25 MG tablet Commonly known as: ATARAX    Melatonin 10 MG Tabs   omeprazole  20 MG tablet Commonly known as: PRILOSEC  OTC   ondansetron  4 MG tablet Commonly known as: ZOFRAN        TAKE these medications    acetaminophen  500 MG tablet Commonly known as:  TYLENOL  Take 500 mg by mouth every 6 (six) hours as needed for mild pain (pain score 1-3) or headache.   calcium  carbonate 500 MG chewable tablet Commonly known as: TUMS - dosed in mg elemental calcium  Chew 2 tablets (400 mg of elemental calcium  total) by mouth 4 (four) times daily as needed for indigestion or heartburn. What changed: how much to take   Centrum Silver Women 50+ Tabs Take 0.5 tablets by mouth 2 (two) times daily with a meal.   metoprolol  tartrate 25 MG tablet Commonly known as: LOPRESSOR  Take 0.5 tablets (12.5 mg total) by mouth 2 (two) times daily. What changed: additional instructions   midodrine  10 MG tablet Commonly known as: PROAMATINE  Take 1 tablet (10 mg total) by mouth 2 (two) times daily with a meal. What changed:  medication strength how much to take when to take this additional instructions   mirtazapine  15 MG tablet Commonly known as: REMERON  Take 1 tablet (15 mg total) by mouth at bedtime.   pantoprazole  40 MG tablet Commonly known as: PROTONIX  Take 1 tablet (40 mg total) by mouth daily.   protein supplement shake Liqd Commonly known as: PREMIER PROTEIN Take 11 oz by mouth See admin instructions. Drink 11 ounces of a warmed, CHOCOLATE shake by mouth one to two times a day as needed for meal supplementation   thiamine  100 MG tablet Commonly known as: Vitamin B-1 Take 1 tablet  (100 mg total) by mouth daily.   Vitamin D3 1000 units Caps Take 1,000-2,000 Units by mouth in the morning.   Xarelto  20 MG Tabs tablet Generic drug: rivaroxaban  TAKE 1 TABLET BY MOUTH EVERY DAY What changed:  how much to take when to take this         Follow-up Information     Kip Righter, MD. Schedule an appointment as soon as possible for a visit in 1 week(s).   Specialty: Family Medicine Contact information: [address] [place] [address] [phone]                 Major procedures and Radiology Reports - PLEASE review detailed and final reports thoroughly  -         ECHOCARDIOGRAM COMPLETE Result Date: 07/30/2024    ECHOCARDIOGRAM REPORT   Patient Name:   Talonda J Leet Date of Exam: 07/30/2024 Medical Rec #:  [id number]      Height:       65.0 in Accession #:    [id number]     Weight:       130.1 lb Date of Birth:  02/23/1933      BSA:          1.648 m Patient Age:    89 years       BP:           123/60 mmHg Patient Gender: F              HR:           65 bpm. Exam Location:  Inpatient Procedure: 2D Echo (Both Spectral and Color Flow Doppler were utilized during            procedure). Indications:    syncope  History:        Patient has prior history of Echocardiogram examinations, most                 recent 11/20/2023. Arrythmias:Atrial Fibrillation.  Sonographer:    Tinnie Barefoot RDCS Referring Phys: [id number] [place] [address]  M GHIMIRE IMPRESSIONS  1. Left ventricular ejection fraction, by estimation, is 60 to 65%. The left ventricle has normal function. Left ventricular endocardial border not optimally defined to evaluate regional wall motion. Left ventricular diastolic parameters are indeterminate.  2. Right ventricular systolic function is normal. The right ventricular size is normal. There is normal pulmonary artery systolic pressure.  3. The mitral valve is normal in structure. No evidence of mitral valve regurgitation. No evidence of mitral  stenosis.  4. The aortic valve was not well visualized. Aortic valve regurgitation is moderate.  5. The inferior vena cava is normal in size with greater than 50% respiratory variability, suggesting right atrial pressure of 3 mmHg. FINDINGS  Left Ventricle: Left ventricular ejection fraction, by estimation, is 60 to 65%. The left ventricle has normal function. Left ventricular endocardial border not optimally defined to evaluate regional wall motion. The left ventricular internal cavity size was normal in size. There is no left ventricular hypertrophy. Left ventricular diastolic parameters are indeterminate. Right Ventricle: The right ventricular size is normal. Right vetricular wall thickness was not well visualized. Right ventricular systolic function is normal. There is normal pulmonary artery systolic pressure. The tricuspid regurgitant velocity is 2.32 m/s, and with an assumed right atrial pressure of 3 mmHg, the estimated right ventricular systolic pressure is 24.5 mmHg. Left Atrium: Left atrial size was normal in size. Right Atrium: Right atrial size was normal in size. Pericardium: There is no evidence of pericardial effusion. Mitral Valve: The mitral valve is normal in structure. No evidence of mitral valve regurgitation. No evidence of mitral valve stenosis. Tricuspid Valve: The tricuspid valve is normal in structure. Tricuspid valve regurgitation is trivial. No evidence of tricuspid stenosis. Aortic Valve: The aortic valve was not well visualized. Aortic valve regurgitation is moderate. Aortic regurgitation PHT measures 577 msec. Aortic valve mean gradient measures 3.0 mmHg. Aortic valve peak gradient measures 5.5 mmHg. Aortic valve area, by VTI measures 1.70 cm. Pulmonic Valve: The pulmonic valve was not well visualized. Pulmonic valve regurgitation is not visualized. No evidence of pulmonic stenosis. Aorta: The aortic root and ascending aorta are structurally normal, with no evidence of dilitation.  Venous: The inferior vena cava is normal in size with greater than 50% respiratory variability, suggesting right atrial pressure of 3 mmHg. IAS/Shunts: No atrial level shunt detected by color flow Doppler.  LEFT VENTRICLE PLAX 2D LVIDd:         4.60 cm   Diastology LVIDs:         2.70 cm   LV e' medial:    7.62 cm/s LV PW:         0.80 cm   LV E/e' medial:  8.0 LV IVS:        0.70 cm   LV e' lateral:   7.07 cm/s LVOT diam:     1.90 cm   LV E/e' lateral: 8.6 LV SV:         50 LV SV Index:   30 LVOT Area:     2.84 cm  RIGHT VENTRICLE             IVC RV Basal diam:  3.10 cm     IVC diam: 1.70 cm RV S prime:     10.00 cm/s TAPSE (M-mode): 1.8 cm LEFT ATRIUM           Index        RIGHT ATRIUM           Index LA diam:  3.20 cm 1.94 cm/m   RA Area:     13.00 cm LA Vol (A4C): 35.6 ml 21.61 ml/m  RA Volume:   31.70 ml  19.24 ml/m  AORTIC VALVE AV Area (Vmax):    1.63 cm AV Area (Vmean):   1.45 cm AV Area (VTI):     1.70 cm AV Vmax:           116.73 cm/s AV Vmean:          81.822 cm/s AV VTI:            0.294 m AV Peak Grad:      5.5 mmHg AV Mean Grad:      3.0 mmHg LVOT Vmax:         67.00 cm/s LVOT Vmean:        41.900 cm/s LVOT VTI:          0.176 m LVOT/AV VTI ratio: 0.60 AI PHT:            577 msec  AORTA Ao Root diam: 3.80 cm Ao Asc diam:  3.30 cm MITRAL VALVE               TRICUSPID VALVE MV Area (PHT): 2.62 cm    TR Peak grad:   21.5 mmHg MV Decel Time: 290 msec    TR Vmax:        232.00 cm/s MV E velocity: 60.90 cm/s MV A velocity: 50.10 cm/s  SHUNTS MV E/A ratio:  1.22        Systemic VTI:  0.18 m                            Systemic Diam: 1.90 cm Dorn Ross MD Electronically signed by Dorn Ross MD Signature Date/Time: 07/30/2024/2:44:05 PM    Final    CT Head Wo Contrast Result Date: 07/28/2024 EXAM: CT HEAD WITHOUT CONTRAST 07/28/2024 12:56:00 PM TECHNIQUE: CT of the head was performed without the administration of intravenous contrast. Automated exposure control, iterative reconstruction,  and/or weight based adjustment of the mA/kV was utilized to reduce the radiation dose to as low as reasonably achievable. COMPARISON: CT of the head dated 06/06/2024. CLINICAL HISTORY: Head trauma, minor (Age >= 65y). AMS FINDINGS: BRAIN AND VENTRICLES: No acute hemorrhage. Gray-white differentiation is preserved. No hydrocephalus. No extra-axial collection. No mass effect or midline shift. Moderate amount of generalized cerebral and cerebellar volume loss present. Moderate periventricular white matter disease. ORBITS: The patient is status post bilateral lens replacement. SINUSES: No acute abnormality. SOFT TISSUES AND SKULL: No acute soft tissue abnormality. No skull fracture. IMPRESSION: 1. No acute intracranial abnormality. 2. Moderate amount of generalized cerebral and cerebellar volume loss. 3. Moderate periventricular white matter disease. Electronically signed by: evalene coho 07/28/2024 01:01 PM EDT RP Workstation: [id number]   DG Chest Portable 1 View Result Date: 07/28/2024 CLINICAL DATA:  Recent syncopal episode EXAM: PORTABLE CHEST 1 VIEW COMPARISON:  06/06/2024 CT FINDINGS: Cardiac shadow is within normal limits. Aortic calcifications are seen. Chronic scarring in the bases is noted right slightly greater than left. No focal infiltrate or effusion is seen. No bony abnormality is noted. IMPRESSION: Chronic scarring in the bases. Electronically Signed   By: Oneil Devonshire M.D.   On: 07/28/2024 12:11    Micro Results     Recent Results (from the past 240 hours)  Blood culture (routine x 2)     Status: None   Collection Time: 07/28/24 11:25  AM   Specimen: BLOOD RIGHT FOREARM  Result Value Ref Range Status   Specimen Description BLOOD RIGHT FOREARM  Final   Special Requests   Final    BOTTLES DRAWN AEROBIC AND ANAEROBIC Blood Culture adequate volume   Culture   Final    NO GROWTH 5 DAYS Performed at [facility], [address], [place], [place] [address]    Report Status  08/02/2024 FINAL  Final  Blood culture (routine x 2)     Status: None   Collection Time: 07/28/24 11:32 AM   Specimen: BLOOD RIGHT HAND  Result Value Ref Range Status   Specimen Description BLOOD RIGHT HAND  Final   Special Requests   Final    BOTTLES DRAWN AEROBIC AND ANAEROBIC Blood Culture adequate volume   Culture   Final    NO GROWTH 5 DAYS Performed at [facility], [address] [place], [place] [address]    Report Status 08/02/2024 FINAL  Final  Resp panel by RT-PCR (RSV, Flu A&B, Covid) Anterior Nasal Swab     Status: None   Collection Time: 07/28/24  5:34 PM   Specimen: Anterior Nasal Swab  Result Value Ref Range Status   SARS Coronavirus 2 by RT PCR NEGATIVE NEGATIVE Final   Influenza A by PCR NEGATIVE NEGATIVE Final   Influenza B by PCR NEGATIVE NEGATIVE Final    Comment: (NOTE) The Xpert Xpress SARS-CoV-2/FLU/RSV plus assay is intended as an aid in the diagnosis of influenza from Nasopharyngeal swab specimens and should not be used as a sole basis for treatment. Nasal washings and aspirates are unacceptable for Xpert Xpress SARS-CoV-2/FLU/RSV testing.  Fact Sheet for Patients: BloggerCourse.com  Fact Sheet for Healthcare Providers: SeriousBroker.it  This test is not yet approved or cleared by the [place]  FDA and has been authorized for detection and/or diagnosis of SARS-CoV-2 by FDA under an Emergency Use Authorization (EUA). This EUA will remain in effect (meaning this test can be used) for the duration of the COVID-19 declaration under Section 564(b)(1) of the Act, 21 U.S.C. section 360bbb-3(b)(1), unless the authorization is terminated or revoked.     Resp Syncytial Virus by PCR NEGATIVE NEGATIVE Final    Comment: (NOTE) Fact Sheet for Patients: BloggerCourse.com  Fact Sheet for Healthcare Providers: SeriousBroker.it  This test is not yet  approved or cleared by the [place]  FDA and has been authorized for detection and/or diagnosis of SARS-CoV-2 by FDA under an Emergency Use Authorization (EUA). This EUA will remain in effect (meaning this test can be used) for the duration of the COVID-19 declaration under Section 564(b)(1) of the Act, 21 U.S.C. section 360bbb-3(b)(1), unless the authorization is terminated or revoked.  Performed at [facility], [address] [place], [place] [address]     Today   Subjective    Kesleigh Morson today has no headache,no chest abdominal pain,no new weakness tingling or numbness, feels much better wants to go home today.     Objective   Blood pressure (!) 122/51, pulse 63, temperature 98 F (36.7 C), temperature source Oral, resp. rate (!) 21, height 5' 5 (1.651 m), weight 59 kg, SpO2 95%.   Intake/Output Summary (Last 24 hours) at 08/03/2024 0754 Last data filed at 08/03/2024 0700 Gross per 24 hour  Intake 120 ml  Output 1250 ml  Net -1130 ml    Exam  Awake Alert, No new F.N deficits,    [place].AT,PERRAL Supple Neck,   Symmetrical Chest wall  movement, Good air movement bilaterally, CTAB RRR,No Gallops,   +ve B.Sounds, Abd Soft, Non tender,  No Cyanosis, Clubbing or edema    Data Review   Recent Labs  Lab 07/28/24 1123 07/28/24 1145 07/29/24 0404 07/31/24 0728  WBC 15.7*  --  9.5 6.6  HGB 14.7 15.3* 10.4* 12.6  HCT 45.1 45.0 31.2* 37.6  PLT 218  --  163 190  MCV 105.1*  --  103.7* 101.3*  MCH 34.3*  --  34.6* 34.0  MCHC 32.6  --  33.3 33.5  RDW 14.4  --  14.6 14.4  LYMPHSABS 1.2  --   --  3.5  MONOABS 0.7  --   --  0.5  EOSABS 0.0  --   --  0.2  BASOSABS 0.1  --   --  0.1    Recent Labs  Lab 07/28/24 1123 07/28/24 1145 07/28/24 1328 07/28/24 1401 07/28/24 1508 07/28/24 1630 07/28/24 1836 07/28/24 2326 07/29/24 0404 07/30/24 0542 07/31/24 0728 08/01/24 0552 08/02/24 0513 08/03/24 0426  NA 141 142  --   --   --   --   --   --    < >  138 141 136 137 138  K 4.1 4.0  --   --   --   --   --   --    < > 3.7 3.5 4.3 4.3 3.9  CL 106 106  --   --   --   --   --   --    < > 110 106 104 104 105  CO2 22  --   --   --   --   --   --   --    < > 21* 27 23 23 25   ANIONGAP 13  --   --   --   --   --   --   --    < > 7 8 9 10 8   GLUCOSE 158* 152*  --   --   --   --   --   --    < > 85 87 91 83 90  BUN 12 11  --   --   --   --   --   --    < > 8 7* 7* 11 11  CREATININE 1.06* 0.80  --   --   --   --   --   --    < > 0.90 0.76 0.71 0.82 0.85  AST 38  --   --   --   --   --   --   --   --   --   --   --   --   --   ALT 14  --   --   --   --   --   --   --   --   --   --   --   --   --   ALKPHOS 76  --   --   --   --   --   --   --   --   --   --   --   --   --   BILITOT 0.9  --   --   --   --   --   --   --   --   --   --   --   --   --   ALBUMIN 3.1*  --   --   --   --   --   --   --   --   --   --   --   --   --  CRP  --   --   --   --  3.7*  --   --   --   --   --   --   --   --   --   DDIMER  --   --   --   --  0.33  --   --   --   --   --   --   --   --   --   LATICACIDVEN  --  4.4* 4.7* 4.7*  --  3.6*  --  2.3*  --   --   --   --   --   --   TSH  --   --   --   --   --   --  4.612*  --   --   --   --   --   --   --   MG  --   --   --   --   --   --   --   --   --   --  1.6* 2.2 1.8 1.7  CALCIUM  9.2  --   --   --   --   --   --   --    < > 8.4* 9.1 8.9 9.2 9.3   < > = values in this interval not displayed.    Total Time in preparing paper work, data evaluation and todays exam - 35 minutes  Signature  -    Lavada Stank M.D on 08/03/2024 at 7:54 AM   -  To page go to [email or web address]

## 2024-08-03 NOTE — TOC Transition Note (Signed)
 Transition of Care [facility]) - Discharge Note   Patient Details  Name: Stacy Valencia MRN: [id number] Date of Birth: September 13, 1933  Transition of Care [facility]) CM/SW Contact:  Inocente GORMAN Kindle, LCSW Phone Number: 08/03/2024, 11:03 AM   Clinical Narrative:    Patient will DC to: [facility] Anticipated DC date: 08/03/24 Family notified: Spouse Transport by: [facility]   Per MD patient ready for DC to [facility]. RN to call report prior to discharge [phone] room 207). RN, patient, patient's family, and facility notified of DC. Discharge Summary and FL2 sent to facility. DC packet on chart. Ambulance transport requested for patient.   CSW will sign off for now as social work intervention is no longer needed. Please consult us  again if new needs arise.     Final next level of care: Skilled Nursing Facility Barriers to Discharge: Barriers Resolved   Patient Goals and CMS Choice Patient states their goals for this hospitalization and ongoing recovery are:: Rehab CMS Medicare.gov Compare Post Acute Care list provided to:: Patient Represenative (must comment) Choice offered to / list presented to : Spouse [place] ownership interest in [facility].provided to:: Spouse    Discharge Placement   Existing PASRR number confirmed : 08/03/24          Patient chooses bed at: [facility] Patient to be transferred to facility by: [facility] Name of family member notified: Spouse Patient and family notified of of transfer: 08/03/24  Discharge Plan and Services Additional resources added to the After Visit Summary for   In-house Referral: Clinical Social Work                                   Social Drivers of Health (SDOH) Interventions SDOH Screenings   Food Insecurity: No Food Insecurity (07/28/2024)  Housing: Low Risk  (07/28/2024)  Transportation Needs: No Transportation Needs (07/28/2024)  Utilities: Not At Risk (07/28/2024)  Social Connections: Socially Integrated  (07/28/2024)  Tobacco Use: Low Risk  (07/28/2024)     Readmission Risk Interventions    04/14/2024    2:39 PM  Readmission Risk Prevention Plan  Post Dischage Appt Complete  Medication Screening Complete  Transportation Screening Complete

## 2024-08-03 NOTE — Progress Notes (Signed)
 Report given to Erica at [facility]

## 2024-08-04 DIAGNOSIS — I959 Hypotension, unspecified: Secondary | ICD-10-CM | POA: Diagnosis not present

## 2024-08-04 DIAGNOSIS — F32A Depression, unspecified: Secondary | ICD-10-CM | POA: Diagnosis not present

## 2024-08-04 DIAGNOSIS — I480 Paroxysmal atrial fibrillation: Secondary | ICD-10-CM | POA: Diagnosis not present

## 2024-08-04 DIAGNOSIS — K219 Gastro-esophageal reflux disease without esophagitis: Secondary | ICD-10-CM | POA: Diagnosis not present

## 2024-08-05 DIAGNOSIS — R55 Syncope and collapse: Secondary | ICD-10-CM | POA: Diagnosis not present

## 2024-08-05 DIAGNOSIS — R296 Repeated falls: Secondary | ICD-10-CM | POA: Diagnosis not present

## 2024-08-05 DIAGNOSIS — I480 Paroxysmal atrial fibrillation: Secondary | ICD-10-CM | POA: Diagnosis not present

## 2024-08-05 DIAGNOSIS — I2699 Other pulmonary embolism without acute cor pulmonale: Secondary | ICD-10-CM | POA: Diagnosis not present

## 2024-08-05 DIAGNOSIS — I959 Hypotension, unspecified: Secondary | ICD-10-CM | POA: Diagnosis not present

## 2024-08-08 DIAGNOSIS — I959 Hypotension, unspecified: Secondary | ICD-10-CM | POA: Diagnosis not present

## 2024-08-10 DIAGNOSIS — R2689 Other abnormalities of gait and mobility: Secondary | ICD-10-CM | POA: Diagnosis not present

## 2024-08-10 DIAGNOSIS — F32A Depression, unspecified: Secondary | ICD-10-CM | POA: Diagnosis not present

## 2024-08-10 DIAGNOSIS — Z9181 History of falling: Secondary | ICD-10-CM | POA: Diagnosis not present

## 2024-08-10 DIAGNOSIS — M6259 Muscle wasting and atrophy, not elsewhere classified, multiple sites: Secondary | ICD-10-CM | POA: Diagnosis not present

## 2024-08-10 DIAGNOSIS — M6281 Muscle weakness (generalized): Secondary | ICD-10-CM | POA: Diagnosis not present

## 2024-08-10 DIAGNOSIS — I480 Paroxysmal atrial fibrillation: Secondary | ICD-10-CM | POA: Diagnosis not present

## 2024-08-24 DIAGNOSIS — Z9181 History of falling: Secondary | ICD-10-CM | POA: Diagnosis not present

## 2024-08-24 DIAGNOSIS — F32A Depression, unspecified: Secondary | ICD-10-CM | POA: Diagnosis not present

## 2024-08-24 DIAGNOSIS — R2689 Other abnormalities of gait and mobility: Secondary | ICD-10-CM | POA: Diagnosis not present

## 2024-08-24 DIAGNOSIS — M6281 Muscle weakness (generalized): Secondary | ICD-10-CM | POA: Diagnosis not present

## 2024-08-24 DIAGNOSIS — M6259 Muscle wasting and atrophy, not elsewhere classified, multiple sites: Secondary | ICD-10-CM | POA: Diagnosis not present

## 2024-08-24 DIAGNOSIS — I480 Paroxysmal atrial fibrillation: Secondary | ICD-10-CM | POA: Diagnosis not present

## 2024-08-30 DIAGNOSIS — F32A Depression, unspecified: Secondary | ICD-10-CM | POA: Diagnosis not present

## 2024-08-30 DIAGNOSIS — I2699 Other pulmonary embolism without acute cor pulmonale: Secondary | ICD-10-CM | POA: Diagnosis not present

## 2024-08-30 DIAGNOSIS — K219 Gastro-esophageal reflux disease without esophagitis: Secondary | ICD-10-CM | POA: Diagnosis not present

## 2024-08-30 DIAGNOSIS — I480 Paroxysmal atrial fibrillation: Secondary | ICD-10-CM | POA: Diagnosis not present

## 2024-08-31 DIAGNOSIS — I9589 Other hypotension: Secondary | ICD-10-CM | POA: Diagnosis not present

## 2024-08-31 DIAGNOSIS — I480 Paroxysmal atrial fibrillation: Secondary | ICD-10-CM | POA: Diagnosis not present

## 2024-08-31 DIAGNOSIS — E43 Unspecified severe protein-calorie malnutrition: Secondary | ICD-10-CM | POA: Diagnosis not present

## 2024-08-31 DIAGNOSIS — I700 Atherosclerosis of aorta: Secondary | ICD-10-CM | POA: Diagnosis not present

## 2024-08-31 DIAGNOSIS — I2510 Atherosclerotic heart disease of native coronary artery without angina pectoris: Secondary | ICD-10-CM | POA: Diagnosis not present

## 2024-08-31 DIAGNOSIS — M4854XD Collapsed vertebra, not elsewhere classified, thoracic region, subsequent encounter for fracture with routine healing: Secondary | ICD-10-CM | POA: Diagnosis not present

## 2024-09-01 DIAGNOSIS — I2510 Atherosclerotic heart disease of native coronary artery without angina pectoris: Secondary | ICD-10-CM | POA: Diagnosis not present

## 2024-09-01 DIAGNOSIS — M4854XD Collapsed vertebra, not elsewhere classified, thoracic region, subsequent encounter for fracture with routine healing: Secondary | ICD-10-CM | POA: Diagnosis not present

## 2024-09-01 DIAGNOSIS — I9589 Other hypotension: Secondary | ICD-10-CM | POA: Diagnosis not present

## 2024-09-01 DIAGNOSIS — I480 Paroxysmal atrial fibrillation: Secondary | ICD-10-CM | POA: Diagnosis not present

## 2024-09-01 DIAGNOSIS — I700 Atherosclerosis of aorta: Secondary | ICD-10-CM | POA: Diagnosis not present

## 2024-09-01 DIAGNOSIS — E43 Unspecified severe protein-calorie malnutrition: Secondary | ICD-10-CM | POA: Diagnosis not present

## 2024-09-06 DIAGNOSIS — I480 Paroxysmal atrial fibrillation: Secondary | ICD-10-CM | POA: Diagnosis not present

## 2024-09-06 DIAGNOSIS — E43 Unspecified severe protein-calorie malnutrition: Secondary | ICD-10-CM | POA: Diagnosis not present

## 2024-09-06 DIAGNOSIS — I9589 Other hypotension: Secondary | ICD-10-CM | POA: Diagnosis not present

## 2024-09-06 DIAGNOSIS — I2510 Atherosclerotic heart disease of native coronary artery without angina pectoris: Secondary | ICD-10-CM | POA: Diagnosis not present

## 2024-09-06 DIAGNOSIS — M4854XD Collapsed vertebra, not elsewhere classified, thoracic region, subsequent encounter for fracture with routine healing: Secondary | ICD-10-CM | POA: Diagnosis not present

## 2024-09-06 DIAGNOSIS — I700 Atherosclerosis of aorta: Secondary | ICD-10-CM | POA: Diagnosis not present

## 2024-09-07 DIAGNOSIS — E559 Vitamin D deficiency, unspecified: Secondary | ICD-10-CM | POA: Diagnosis not present

## 2024-09-07 DIAGNOSIS — Z09 Encounter for follow-up examination after completed treatment for conditions other than malignant neoplasm: Secondary | ICD-10-CM | POA: Diagnosis not present

## 2024-09-07 DIAGNOSIS — M6250 Muscle wasting and atrophy, not elsewhere classified, unspecified site: Secondary | ICD-10-CM | POA: Diagnosis not present

## 2024-09-07 DIAGNOSIS — E039 Hypothyroidism, unspecified: Secondary | ICD-10-CM | POA: Diagnosis not present

## 2024-09-07 DIAGNOSIS — Z23 Encounter for immunization: Secondary | ICD-10-CM | POA: Diagnosis not present

## 2024-09-07 DIAGNOSIS — Z86711 Personal history of pulmonary embolism: Secondary | ICD-10-CM | POA: Diagnosis not present

## 2024-09-08 DIAGNOSIS — I2510 Atherosclerotic heart disease of native coronary artery without angina pectoris: Secondary | ICD-10-CM | POA: Diagnosis not present

## 2024-09-08 DIAGNOSIS — E43 Unspecified severe protein-calorie malnutrition: Secondary | ICD-10-CM | POA: Diagnosis not present

## 2024-09-08 DIAGNOSIS — I700 Atherosclerosis of aorta: Secondary | ICD-10-CM | POA: Diagnosis not present

## 2024-09-08 DIAGNOSIS — M4854XD Collapsed vertebra, not elsewhere classified, thoracic region, subsequent encounter for fracture with routine healing: Secondary | ICD-10-CM | POA: Diagnosis not present

## 2024-09-08 DIAGNOSIS — I480 Paroxysmal atrial fibrillation: Secondary | ICD-10-CM | POA: Diagnosis not present

## 2024-09-08 DIAGNOSIS — I9589 Other hypotension: Secondary | ICD-10-CM | POA: Diagnosis not present

## 2024-09-13 DIAGNOSIS — I700 Atherosclerosis of aorta: Secondary | ICD-10-CM | POA: Diagnosis not present

## 2024-09-13 DIAGNOSIS — M4854XD Collapsed vertebra, not elsewhere classified, thoracic region, subsequent encounter for fracture with routine healing: Secondary | ICD-10-CM | POA: Diagnosis not present

## 2024-09-13 DIAGNOSIS — I2510 Atherosclerotic heart disease of native coronary artery without angina pectoris: Secondary | ICD-10-CM | POA: Diagnosis not present

## 2024-09-13 DIAGNOSIS — E43 Unspecified severe protein-calorie malnutrition: Secondary | ICD-10-CM | POA: Diagnosis not present

## 2024-09-13 DIAGNOSIS — I9589 Other hypotension: Secondary | ICD-10-CM | POA: Diagnosis not present

## 2024-09-13 DIAGNOSIS — I480 Paroxysmal atrial fibrillation: Secondary | ICD-10-CM | POA: Diagnosis not present

## 2024-09-15 DIAGNOSIS — I480 Paroxysmal atrial fibrillation: Secondary | ICD-10-CM | POA: Diagnosis not present

## 2024-09-15 DIAGNOSIS — I700 Atherosclerosis of aorta: Secondary | ICD-10-CM | POA: Diagnosis not present

## 2024-09-15 DIAGNOSIS — E119 Type 2 diabetes mellitus without complications: Secondary | ICD-10-CM | POA: Diagnosis not present

## 2024-09-19 DIAGNOSIS — E43 Unspecified severe protein-calorie malnutrition: Secondary | ICD-10-CM | POA: Diagnosis not present

## 2024-09-19 DIAGNOSIS — M4854XD Collapsed vertebra, not elsewhere classified, thoracic region, subsequent encounter for fracture with routine healing: Secondary | ICD-10-CM | POA: Diagnosis not present

## 2024-09-19 DIAGNOSIS — I2510 Atherosclerotic heart disease of native coronary artery without angina pectoris: Secondary | ICD-10-CM | POA: Diagnosis not present

## 2024-09-19 DIAGNOSIS — I9589 Other hypotension: Secondary | ICD-10-CM | POA: Diagnosis not present

## 2024-09-19 DIAGNOSIS — I480 Paroxysmal atrial fibrillation: Secondary | ICD-10-CM | POA: Diagnosis not present

## 2024-09-19 DIAGNOSIS — I700 Atherosclerosis of aorta: Secondary | ICD-10-CM | POA: Diagnosis not present

## 2024-09-20 DIAGNOSIS — M4854XD Collapsed vertebra, not elsewhere classified, thoracic region, subsequent encounter for fracture with routine healing: Secondary | ICD-10-CM | POA: Diagnosis not present

## 2024-09-20 DIAGNOSIS — I480 Paroxysmal atrial fibrillation: Secondary | ICD-10-CM | POA: Diagnosis not present

## 2024-09-20 DIAGNOSIS — E43 Unspecified severe protein-calorie malnutrition: Secondary | ICD-10-CM | POA: Diagnosis not present

## 2024-09-20 DIAGNOSIS — I9589 Other hypotension: Secondary | ICD-10-CM | POA: Diagnosis not present

## 2024-09-20 DIAGNOSIS — I2510 Atherosclerotic heart disease of native coronary artery without angina pectoris: Secondary | ICD-10-CM | POA: Diagnosis not present

## 2024-09-20 DIAGNOSIS — E119 Type 2 diabetes mellitus without complications: Secondary | ICD-10-CM | POA: Diagnosis not present

## 2024-09-20 DIAGNOSIS — E785 Hyperlipidemia, unspecified: Secondary | ICD-10-CM | POA: Diagnosis not present

## 2024-09-20 DIAGNOSIS — I700 Atherosclerosis of aorta: Secondary | ICD-10-CM | POA: Diagnosis not present

## 2024-09-20 DIAGNOSIS — M810 Age-related osteoporosis without current pathological fracture: Secondary | ICD-10-CM | POA: Diagnosis not present

## 2024-09-24 DIAGNOSIS — I700 Atherosclerosis of aorta: Secondary | ICD-10-CM | POA: Diagnosis not present

## 2024-09-24 DIAGNOSIS — M4854XD Collapsed vertebra, not elsewhere classified, thoracic region, subsequent encounter for fracture with routine healing: Secondary | ICD-10-CM | POA: Diagnosis not present

## 2024-09-24 DIAGNOSIS — E039 Hypothyroidism, unspecified: Secondary | ICD-10-CM | POA: Diagnosis not present

## 2024-09-24 DIAGNOSIS — K219 Gastro-esophageal reflux disease without esophagitis: Secondary | ICD-10-CM | POA: Diagnosis not present

## 2024-09-24 DIAGNOSIS — I9589 Other hypotension: Secondary | ICD-10-CM | POA: Diagnosis not present

## 2024-09-24 DIAGNOSIS — Z87440 Personal history of urinary (tract) infections: Secondary | ICD-10-CM | POA: Diagnosis not present

## 2024-09-24 DIAGNOSIS — Z87891 Personal history of nicotine dependence: Secondary | ICD-10-CM | POA: Diagnosis not present

## 2024-09-24 DIAGNOSIS — Z8719 Personal history of other diseases of the digestive system: Secondary | ICD-10-CM | POA: Diagnosis not present

## 2024-09-24 DIAGNOSIS — M6259 Muscle wasting and atrophy, not elsewhere classified, multiple sites: Secondary | ICD-10-CM | POA: Diagnosis not present

## 2024-09-24 DIAGNOSIS — M4856XD Collapsed vertebra, not elsewhere classified, lumbar region, subsequent encounter for fracture with routine healing: Secondary | ICD-10-CM | POA: Diagnosis not present

## 2024-09-24 DIAGNOSIS — Z86711 Personal history of pulmonary embolism: Secondary | ICD-10-CM | POA: Diagnosis not present

## 2024-09-24 DIAGNOSIS — I2510 Atherosclerotic heart disease of native coronary artery without angina pectoris: Secondary | ICD-10-CM | POA: Diagnosis not present

## 2024-09-24 DIAGNOSIS — Z7901 Long term (current) use of anticoagulants: Secondary | ICD-10-CM | POA: Diagnosis not present

## 2024-09-24 DIAGNOSIS — Z9181 History of falling: Secondary | ICD-10-CM | POA: Diagnosis not present

## 2024-09-24 DIAGNOSIS — F4322 Adjustment disorder with anxiety: Secondary | ICD-10-CM | POA: Diagnosis not present

## 2024-09-24 DIAGNOSIS — Z86718 Personal history of other venous thrombosis and embolism: Secondary | ICD-10-CM | POA: Diagnosis not present

## 2024-09-24 DIAGNOSIS — I480 Paroxysmal atrial fibrillation: Secondary | ICD-10-CM | POA: Diagnosis not present

## 2024-09-24 DIAGNOSIS — E43 Unspecified severe protein-calorie malnutrition: Secondary | ICD-10-CM | POA: Diagnosis not present

## 2024-09-27 DIAGNOSIS — M6259 Muscle wasting and atrophy, not elsewhere classified, multiple sites: Secondary | ICD-10-CM | POA: Diagnosis not present

## 2024-09-27 DIAGNOSIS — I9589 Other hypotension: Secondary | ICD-10-CM | POA: Diagnosis not present

## 2024-09-27 DIAGNOSIS — I2510 Atherosclerotic heart disease of native coronary artery without angina pectoris: Secondary | ICD-10-CM | POA: Diagnosis not present

## 2024-09-27 DIAGNOSIS — I700 Atherosclerosis of aorta: Secondary | ICD-10-CM | POA: Diagnosis not present

## 2024-09-27 DIAGNOSIS — E43 Unspecified severe protein-calorie malnutrition: Secondary | ICD-10-CM | POA: Diagnosis not present

## 2024-09-27 DIAGNOSIS — I480 Paroxysmal atrial fibrillation: Secondary | ICD-10-CM | POA: Diagnosis not present

## 2024-10-04 DIAGNOSIS — I700 Atherosclerosis of aorta: Secondary | ICD-10-CM | POA: Diagnosis not present

## 2024-10-04 DIAGNOSIS — E43 Unspecified severe protein-calorie malnutrition: Secondary | ICD-10-CM | POA: Diagnosis not present

## 2024-10-04 DIAGNOSIS — I2510 Atherosclerotic heart disease of native coronary artery without angina pectoris: Secondary | ICD-10-CM | POA: Diagnosis not present

## 2024-10-04 DIAGNOSIS — M6259 Muscle wasting and atrophy, not elsewhere classified, multiple sites: Secondary | ICD-10-CM | POA: Diagnosis not present

## 2024-10-04 DIAGNOSIS — I9589 Other hypotension: Secondary | ICD-10-CM | POA: Diagnosis not present

## 2024-10-04 DIAGNOSIS — I480 Paroxysmal atrial fibrillation: Secondary | ICD-10-CM | POA: Diagnosis not present

## 2024-10-11 DIAGNOSIS — I480 Paroxysmal atrial fibrillation: Secondary | ICD-10-CM | POA: Diagnosis not present

## 2024-10-11 DIAGNOSIS — I2510 Atherosclerotic heart disease of native coronary artery without angina pectoris: Secondary | ICD-10-CM | POA: Diagnosis not present

## 2024-10-11 DIAGNOSIS — E43 Unspecified severe protein-calorie malnutrition: Secondary | ICD-10-CM | POA: Diagnosis not present

## 2024-10-11 DIAGNOSIS — I9589 Other hypotension: Secondary | ICD-10-CM | POA: Diagnosis not present

## 2024-10-11 DIAGNOSIS — I700 Atherosclerosis of aorta: Secondary | ICD-10-CM | POA: Diagnosis not present

## 2024-10-11 DIAGNOSIS — M6259 Muscle wasting and atrophy, not elsewhere classified, multiple sites: Secondary | ICD-10-CM | POA: Diagnosis not present

## 2024-10-15 DIAGNOSIS — E119 Type 2 diabetes mellitus without complications: Secondary | ICD-10-CM | POA: Diagnosis not present

## 2024-10-15 DIAGNOSIS — I700 Atherosclerosis of aorta: Secondary | ICD-10-CM | POA: Diagnosis not present

## 2024-10-15 DIAGNOSIS — I480 Paroxysmal atrial fibrillation: Secondary | ICD-10-CM | POA: Diagnosis not present

## 2024-10-18 DIAGNOSIS — I2510 Atherosclerotic heart disease of native coronary artery without angina pectoris: Secondary | ICD-10-CM | POA: Diagnosis not present

## 2024-10-18 DIAGNOSIS — M6259 Muscle wasting and atrophy, not elsewhere classified, multiple sites: Secondary | ICD-10-CM | POA: Diagnosis not present

## 2024-10-18 DIAGNOSIS — I9589 Other hypotension: Secondary | ICD-10-CM | POA: Diagnosis not present

## 2024-10-18 DIAGNOSIS — E43 Unspecified severe protein-calorie malnutrition: Secondary | ICD-10-CM | POA: Diagnosis not present

## 2024-10-18 DIAGNOSIS — I480 Paroxysmal atrial fibrillation: Secondary | ICD-10-CM | POA: Diagnosis not present

## 2024-10-18 DIAGNOSIS — I700 Atherosclerosis of aorta: Secondary | ICD-10-CM | POA: Diagnosis not present

## 2024-10-21 DIAGNOSIS — I700 Atherosclerosis of aorta: Secondary | ICD-10-CM | POA: Diagnosis not present

## 2024-10-21 DIAGNOSIS — E785 Hyperlipidemia, unspecified: Secondary | ICD-10-CM | POA: Diagnosis not present

## 2024-10-21 DIAGNOSIS — M810 Age-related osteoporosis without current pathological fracture: Secondary | ICD-10-CM | POA: Diagnosis not present

## 2024-10-21 DIAGNOSIS — I480 Paroxysmal atrial fibrillation: Secondary | ICD-10-CM | POA: Diagnosis not present

## 2024-10-21 DIAGNOSIS — E119 Type 2 diabetes mellitus without complications: Secondary | ICD-10-CM | POA: Diagnosis not present

## 2024-10-27 DIAGNOSIS — Z23 Encounter for immunization: Secondary | ICD-10-CM | POA: Diagnosis not present

## 2024-10-27 DIAGNOSIS — E039 Hypothyroidism, unspecified: Secondary | ICD-10-CM | POA: Diagnosis not present

## 2024-10-27 DIAGNOSIS — Z0000 Encounter for general adult medical examination without abnormal findings: Secondary | ICD-10-CM | POA: Diagnosis not present

## 2024-10-27 DIAGNOSIS — R636 Underweight: Secondary | ICD-10-CM | POA: Diagnosis not present

## 2024-10-27 DIAGNOSIS — Z86711 Personal history of pulmonary embolism: Secondary | ICD-10-CM | POA: Diagnosis not present

## 2024-10-27 DIAGNOSIS — M810 Age-related osteoporosis without current pathological fracture: Secondary | ICD-10-CM | POA: Diagnosis not present

## 2024-10-27 DIAGNOSIS — E785 Hyperlipidemia, unspecified: Secondary | ICD-10-CM | POA: Diagnosis not present

## 2024-10-27 DIAGNOSIS — R7309 Other abnormal glucose: Secondary | ICD-10-CM | POA: Diagnosis not present

## 2024-10-27 DIAGNOSIS — Z8639 Personal history of other endocrine, nutritional and metabolic disease: Secondary | ICD-10-CM | POA: Diagnosis not present

## 2024-11-14 DIAGNOSIS — I700 Atherosclerosis of aorta: Secondary | ICD-10-CM | POA: Diagnosis not present

## 2024-11-14 DIAGNOSIS — E119 Type 2 diabetes mellitus without complications: Secondary | ICD-10-CM | POA: Diagnosis not present

## 2024-11-14 DIAGNOSIS — I480 Paroxysmal atrial fibrillation: Secondary | ICD-10-CM | POA: Diagnosis not present

## 2024-11-15 DIAGNOSIS — K219 Gastro-esophageal reflux disease without esophagitis: Secondary | ICD-10-CM | POA: Diagnosis not present

## 2024-11-15 DIAGNOSIS — M810 Age-related osteoporosis without current pathological fracture: Secondary | ICD-10-CM | POA: Diagnosis not present

## 2024-11-15 DIAGNOSIS — Z8639 Personal history of other endocrine, nutritional and metabolic disease: Secondary | ICD-10-CM | POA: Diagnosis not present

## 2024-11-20 DIAGNOSIS — I700 Atherosclerosis of aorta: Secondary | ICD-10-CM | POA: Diagnosis not present

## 2024-11-20 DIAGNOSIS — I480 Paroxysmal atrial fibrillation: Secondary | ICD-10-CM | POA: Diagnosis not present

## 2024-11-20 DIAGNOSIS — E785 Hyperlipidemia, unspecified: Secondary | ICD-10-CM | POA: Diagnosis not present

## 2024-11-20 DIAGNOSIS — M810 Age-related osteoporosis without current pathological fracture: Secondary | ICD-10-CM | POA: Diagnosis not present

## 2024-11-20 DIAGNOSIS — E119 Type 2 diabetes mellitus without complications: Secondary | ICD-10-CM | POA: Diagnosis not present

## 2024-12-27 ENCOUNTER — Emergency Department (HOSPITAL_COMMUNITY)

## 2024-12-27 ENCOUNTER — Observation Stay (HOSPITAL_COMMUNITY)
Admission: EM | Admit: 2024-12-27 | Discharge: 2024-12-28 | Disposition: A | Discharge status: Home / Self Care | Attending: Internal Medicine | Admitting: Internal Medicine

## 2024-12-27 ENCOUNTER — Other Ambulatory Visit: Payer: Self-pay

## 2024-12-27 ENCOUNTER — Encounter (HOSPITAL_COMMUNITY): Payer: Self-pay

## 2024-12-27 DIAGNOSIS — E8721 Acute metabolic acidosis: Secondary | ICD-10-CM | POA: Insufficient documentation

## 2024-12-27 DIAGNOSIS — R6510 Systemic inflammatory response syndrome (SIRS) of non-infectious origin without acute organ dysfunction: Principal | ICD-10-CM | POA: Insufficient documentation

## 2024-12-27 DIAGNOSIS — K219 Gastro-esophageal reflux disease without esophagitis: Secondary | ICD-10-CM | POA: Insufficient documentation

## 2024-12-27 DIAGNOSIS — R531 Weakness: Secondary | ICD-10-CM | POA: Diagnosis present

## 2024-12-27 DIAGNOSIS — I4819 Other persistent atrial fibrillation: Secondary | ICD-10-CM | POA: Diagnosis not present

## 2024-12-27 DIAGNOSIS — R718 Other abnormality of red blood cells: Secondary | ICD-10-CM | POA: Diagnosis not present

## 2024-12-27 DIAGNOSIS — Z86718 Personal history of other venous thrombosis and embolism: Secondary | ICD-10-CM | POA: Insufficient documentation

## 2024-12-27 DIAGNOSIS — I5032 Chronic diastolic (congestive) heart failure: Secondary | ICD-10-CM | POA: Insufficient documentation

## 2024-12-27 DIAGNOSIS — R9431 Abnormal electrocardiogram [ECG] [EKG]: Secondary | ICD-10-CM | POA: Insufficient documentation

## 2024-12-27 DIAGNOSIS — I9589 Other hypotension: Secondary | ICD-10-CM | POA: Diagnosis not present

## 2024-12-27 DIAGNOSIS — W19XXXA Unspecified fall, initial encounter: Secondary | ICD-10-CM

## 2024-12-27 DIAGNOSIS — Y92009 Unspecified place in unspecified non-institutional (private) residence as the place of occurrence of the external cause: Secondary | ICD-10-CM | POA: Diagnosis not present

## 2024-12-27 DIAGNOSIS — R509 Fever, unspecified: Secondary | ICD-10-CM | POA: Diagnosis present

## 2024-12-27 DIAGNOSIS — I4581 Long QT syndrome: Secondary | ICD-10-CM | POA: Insufficient documentation

## 2024-12-27 DIAGNOSIS — M6281 Muscle weakness (generalized): Secondary | ICD-10-CM | POA: Insufficient documentation

## 2024-12-27 DIAGNOSIS — I959 Hypotension, unspecified: Principal | ICD-10-CM | POA: Insufficient documentation

## 2024-12-27 DIAGNOSIS — E872 Acidosis, unspecified: Secondary | ICD-10-CM | POA: Diagnosis not present

## 2024-12-27 DIAGNOSIS — I82409 Acute embolism and thrombosis of unspecified deep veins of unspecified lower extremity: Secondary | ICD-10-CM

## 2024-12-27 DIAGNOSIS — Z7901 Long term (current) use of anticoagulants: Secondary | ICD-10-CM | POA: Diagnosis not present

## 2024-12-27 LAB — RESP PANEL BY RT-PCR (RSV, FLU A&B, COVID)  RVPGX2
Influenza A by PCR: NEGATIVE
Influenza B by PCR: NEGATIVE
Resp Syncytial Virus by PCR: NEGATIVE
SARS Coronavirus 2 by RT PCR: NEGATIVE

## 2024-12-27 LAB — COMPREHENSIVE METABOLIC PANEL WITH GFR
ALT: 14 U/L (ref 0–44)
AST: 39 U/L (ref 15–41)
Albumin: 3.5 g/dL (ref 3.5–5.0)
Alkaline Phosphatase: 73 U/L (ref 38–126)
Anion gap: 17 — ABNORMAL HIGH (ref 5–15)
BUN: 24 mg/dL — ABNORMAL HIGH (ref 8–23)
CO2: 19 mmol/L — ABNORMAL LOW (ref 22–32)
Calcium: 9 mg/dL (ref 8.9–10.3)
Chloride: 105 mmol/L (ref 98–111)
Creatinine, Ser: 1.02 mg/dL — ABNORMAL HIGH (ref 0.44–1.00)
GFR, Estimated: 52 mL/min — ABNORMAL LOW
Glucose, Bld: 179 mg/dL — ABNORMAL HIGH (ref 70–99)
Potassium: 3.8 mmol/L (ref 3.5–5.1)
Sodium: 140 mmol/L (ref 135–145)
Total Bilirubin: 0.9 mg/dL (ref 0.0–1.2)
Total Protein: 6.6 g/dL (ref 6.5–8.1)

## 2024-12-27 LAB — URINALYSIS, W/ REFLEX TO CULTURE (INFECTION SUSPECTED)
Bacteria, UA: NONE SEEN
Bilirubin Urine: NEGATIVE
Glucose, UA: NEGATIVE mg/dL
Hgb urine dipstick: NEGATIVE
Ketones, ur: NEGATIVE mg/dL
Leukocytes,Ua: NEGATIVE
Nitrite: NEGATIVE
Protein, ur: NEGATIVE mg/dL
Specific Gravity, Urine: 1.048 — ABNORMAL HIGH (ref 1.005–1.030)
pH: 5 (ref 5.0–8.0)

## 2024-12-27 LAB — I-STAT CG4 LACTIC ACID, ED
Lactic Acid, Venous: 4.7 mmol/L (ref 0.5–1.9)
Lactic Acid, Venous: 3.5 mmol/L (ref 0.5–1.9)

## 2024-12-27 LAB — CBC
HCT: 42.9 % (ref 36.0–46.0)
Hemoglobin: 14.1 g/dL (ref 12.0–15.0)
MCH: 34.6 pg — ABNORMAL HIGH (ref 26.0–34.0)
MCHC: 32.9 g/dL (ref 30.0–36.0)
MCV: 105.4 fL — ABNORMAL HIGH (ref 80.0–100.0)
Platelets: 149 K/uL — ABNORMAL LOW (ref 150–400)
RBC: 4.07 MIL/uL (ref 3.87–5.11)
RDW: 14.2 % (ref 11.5–15.5)
WBC: 20.4 K/uL — ABNORMAL HIGH (ref 4.0–10.5)
nRBC: 0 % (ref 0.0–0.2)

## 2024-12-27 LAB — LIPASE, BLOOD: Lipase: <10 U/L — ABNORMAL LOW (ref 11–51)

## 2024-12-27 LAB — TROPONIN T, HIGH SENSITIVITY
Troponin T High Sensitivity: 19 ng/L (ref 0–19)
Troponin T High Sensitivity: 24 ng/L — ABNORMAL HIGH (ref 0–19)

## 2024-12-27 LAB — MAGNESIUM: Magnesium: 1.5 mg/dL — ABNORMAL LOW (ref 1.7–2.4)

## 2024-12-27 LAB — TSH: TSH: 4.73 u[IU]/mL — ABNORMAL HIGH (ref 0.350–4.500)

## 2024-12-27 LAB — PROTIME-INR
INR: 1.3 — ABNORMAL HIGH (ref 0.8–1.2)
Prothrombin Time: 16.8 s — ABNORMAL HIGH (ref 11.4–15.2)

## 2024-12-27 LAB — CBG MONITORING, ED: Glucose-Capillary: 202 mg/dL — ABNORMAL HIGH (ref 70–99)

## 2024-12-27 MED ORDER — SODIUM CHLORIDE 0.9 % IV SOLN
2.0000 g | Freq: Two times a day (BID) | INTRAVENOUS | Status: DC
  Administered 2024-12-28: 2 g via INTRAVENOUS
  Filled 2024-12-27: qty 12.5

## 2024-12-27 MED ORDER — PROCHLORPERAZINE EDISYLATE 10 MG/2ML IJ SOLN: 10.0000 mg | Freq: Four times a day (QID) | INTRAMUSCULAR | Status: DC | PRN

## 2024-12-27 MED ORDER — IOHEXOL 350 MG/ML SOLN
100.0000 mL | Freq: Once | INTRAVENOUS | Status: AC | PRN
  Administered 2024-12-27: 100 mL via INTRAVENOUS

## 2024-12-27 MED ORDER — SODIUM CHLORIDE 0.9 % IV SOLN
2.0000 g | Freq: Once | INTRAVENOUS | Status: AC
  Administered 2024-12-27: 2 g via INTRAVENOUS
  Filled 2024-12-27: qty 12.5

## 2024-12-27 MED ORDER — ACETAMINOPHEN 325 MG PO TABS: 650.0000 mg | ORAL_TABLET | Freq: Four times a day (QID) | ORAL | Status: DC | PRN

## 2024-12-27 MED ORDER — MAGNESIUM SULFATE 2 GM/50ML IV SOLN
2.0000 g | Freq: Once | INTRAVENOUS | Status: AC
  Administered 2024-12-27: 2 g via INTRAVENOUS
  Filled 2024-12-27: qty 50

## 2024-12-27 MED ORDER — LACTATED RINGERS IV BOLUS
1000.0000 mL | Freq: Once | INTRAVENOUS | Status: AC
  Administered 2024-12-27: 1000 mL via INTRAVENOUS

## 2024-12-27 MED ORDER — SODIUM CHLORIDE 0.9 % IV BOLUS
500.0000 mL | Freq: Once | INTRAVENOUS | Status: AC
  Administered 2024-12-27: 500 mL via INTRAVENOUS

## 2024-12-27 MED ORDER — LACTATED RINGERS IV SOLN: INTRAVENOUS | Status: DC

## 2024-12-27 MED ORDER — ACETAMINOPHEN 650 MG RE SUPP: 650.0000 mg | Freq: Four times a day (QID) | RECTAL | Status: DC | PRN

## 2024-12-27 NOTE — H&P (Signed)
 " History and Physical    Patient: Stacy Valencia FMW:[id number] DOB: 1933/12/09 DOA: 12/27/2024 DOS: the patient was seen and examined on 12/27/2024 PCP: Kip Righter, MD  Patient coming from: Home  Chief Complaint:  Chief Complaint  Patient presents with   Emesis   Weakness   HPI: Stacy Valencia is a 89 y.o. female with medical history significant of of A-fib on Xarelto , HFpEF, VTE, chronic hypotension on midodrine  who presents to the emergency department due to a fall at home.  Patient states that she slipped off the end of a stool to the floor and was unable to get up, husband was unable to get her up, so she activated EMS.  On arrival of EMS team, she states that she thinks she vomited on herself, BP was checked and was noted to be hypotensive (has chronic history of hypotension), so she was sent to the ED for further evaluation and management.  She initially admitted to back pain in triage, but later on denied any back pain.  She also denies chest pain, shortness of breath, cough, burning sensation on urination or any irritative bladder symptoms.  ED course In the emergency department, she was initially tachycardic, but this normalized, other vital signs were within normal range.  Workup in the ED showed normal CBC except for WBC of 20.4.  BMP was significant for bicarb of 19, blood glucose 179, BUN 24, creatinine 1.02 (baseline creatinine at 0.8-0.9).  TSH 4.730, lactic acid 4.7 > 3.5, magnesium  1.5, troponin x 1 - 24.  Respiratory panel was normal. CT abdomen and pelvis showed no acute intraabdominal or pelvic abnormality Chest x-ray showed no acute active cardiopulmonary disease IV cefepime  due to presumed UTI was given, IV hydration was provided.  TRH was asked to admit patient   Review of Systems: As mentioned in the history of present illness. All other systems reviewed and are negative. Past Medical History:  Diagnosis Date   Acute cholecystitis 04/11/2024   Adjustment disorder  with anxious mood    Atrial fibrillation (HCC) 11/19/2023   Atrial fibrillation with RVR (HCC) 11/20/2023   Choledocholithiasis 04/10/2024   DVT (deep venous thrombosis) (HCC) 01/07/2013   Femur fracture, left (HCC) 07/25/2020   GERD (gastroesophageal reflux disease)    Hip fracture (HCC) 07/22/2020   PE (pulmonary embolism) 12/23/2003   Pulmonary embolism (HCC) 01/05/2013   Rhabdomyolysis 11/19/2023   Past Surgical History:  Procedure Laterality Date   ABDOMINAL HYSTERECTOMY     APPENDECTOMY     arthroscopic shoulder surgery Right 10/28/2016   decompression of SAD/DCR   BILIARY DILATION  04/13/2024   Procedure: DILATION, STRICTURE, BILE DUCT;  Surgeon: Charlanne Groom, MD;  Location: [facility] ENDOSCOPY;  Service: Gastroenterology;;   ERCP N/A 04/13/2024   Procedure: ERCP, WITH INTERVENTION IF INDICATED;  Surgeon: Charlanne Groom, MD;  Location: [facility] ENDOSCOPY;  Service: Gastroenterology;  Laterality: N/A;   INTRAMEDULLARY (IM) NAIL INTERTROCHANTERIC Left 07/23/2020   Procedure: INTRAMEDULLARY (IM) NAIL INTERTROCHANTRIC HIP;  Surgeon: Fidel Rogue, MD;  Location: [facility] OR;  Service: Orthopedics;  Laterality: Left;   Social History:  reports that she has never smoked. She has never used smokeless tobacco. She reports that she does not drink alcohol and does not use drugs.  Allergies[1]  Family History  Problem Relation Age of Onset   Pulmonary embolism Mother    Other Father        Head trauma   Emphysema Sister    Alcoholism Sister    Down syndrome Brother  Prior to Admission medications  Medication Sig Start Date End Date Taking? Authorizing Provider  acetaminophen  (TYLENOL ) 500 MG tablet Take 500 mg by mouth every 6 (six) hours as needed for mild pain (pain score 1-3) or headache.    [provider]  calcium  carbonate (TUMS - DOSED IN MG ELEMENTAL CALCIUM ) 500 MG chewable tablet Chew 2 tablets (400 mg of elemental calcium  total) by mouth 4 (four) times daily as needed for  indigestion or heartburn. Patient taking differently: Chew 1-2 tablets by mouth 4 (four) times daily as needed for indigestion or heartburn. 06/10/24   Laurence Locus, DO  Cholecalciferol (VITAMIN D3) 1000 units CAPS Take 1,000-2,000 Units by mouth in the morning.    [provider]  metoprolol  tartrate (LOPRESSOR ) 25 MG tablet Take 0.5 tablets (12.5 mg total) by mouth 2 (two) times daily. Patient taking differently: Take 12.5 mg by mouth 2 (two) times daily. Take 12.5 mg by mouth in the morning and early afternoon 08/09/20   Angiulli, Toribio PARAS, PA-C  midodrine  (PROAMATINE ) 10 MG tablet Take 1 tablet (10 mg total) by mouth 2 (two) times daily with a meal. 08/03/24   Dennise Lavada POUR, MD  mirtazapine  (REMERON ) 15 MG tablet Take 1 tablet (15 mg total) by mouth at bedtime. Patient not taking: Reported on 07/28/2024 06/10/24 09/08/24  Laurence Locus, DO  Multiple Vitamins-Minerals (CENTRUM SILVER WOMEN 50+) TABS Take 0.5 tablets by mouth 2 (two) times daily with a meal.    [provider]  pantoprazole  (PROTONIX ) 40 MG tablet Take 1 tablet (40 mg total) by mouth daily. 08/03/24   Singh, Prashant K, MD  protein supplement shake (PREMIER PROTEIN) LIQD Take 11 oz by mouth See admin instructions. Drink 11 ounces of a warmed, CHOCOLATE shake by mouth one to two times a day as needed for meal supplementation    [provider]  thiamine  (VITAMIN B-1) 100 MG tablet Take 1 tablet (100 mg total) by mouth daily. 06/11/24   Laurence Locus, DO  XARELTO  20 MG TABS tablet TAKE 1 TABLET BY MOUTH EVERY DAY Patient taking differently: Take 20 mg by mouth daily at 6 PM. 04/15/22   Amadeo Windell SAILOR, MD    Physical Exam: Vitals:   12/27/24 1504 12/27/24 1800 12/27/24 1803 12/27/24 1915  BP:   108/72 118/67  Pulse:  100 (!) 108 (!) 108  Resp:  20 (!) 22 (!) 24  Temp:   98.3 F (36.8 C)   TempSrc:   Oral   SpO2: 98% 98% (!) 9% 98%  Weight:      Height:       General: Elderly female. Awake and alert and  oriented x3. Not in any acute distress.  HEENT: NCAT.  PERRLA. EOMI. Sclerae anicteric.  Moist mucosal membranes. Neck: Neck supple without lymphadenopathy. No carotid bruits. No masses palpated.  Cardiovascular: Regular rate with normal S1-S2 sounds. No murmurs, rubs or gallops auscultated. No JVD.  Respiratory: Clear breath sounds.  No accessory muscle use. Abdomen: Soft, nontender, nondistended. Active bowel sounds. No masses or hepatosplenomegaly  Skin: No rashes, lesions, or ulcerations.  Dry, warm to touch. Musculoskeletal:  2+ dorsalis pedis and radial pulses. Good ROM.  No contractures  Psychiatric: Intact judgment and insight.  Mood appropriate to current condition. Neurologic: No focal neurological deficits. Strength is 5/5 x 4.  CN II - XII grossly intact.  Assessment and Plan: SIRS with suspicion for sepsis Patient presents with leukocytosis, tachypnea, tachycardia (met SIRS criteria).  However, no known  source of infection at this time. CT abdomen pelvis and chest x-ray showed no acute findings. Urinalysis pending She was empirically started on IV cefepime  due to presumed UTI.  We shall continue with same at this time with plan to de-escalate/discontinue based on urinalysis and urine culture  Lactic acidosis Lactic acid 4.7 > 3.5, continue management as described above Continue gentle hydration and continue to trend lactic acid  Hypomagnesemia Mg level is 1.5 This will be replenished Please continue to monitor Mg level and correct accordingly  Prolonged QT interval QTc 525 ms Avoid QT prolonging drugs Magnesium  1.5, will be replenished Repeat EKG in the morning  ?  History of TSH History of TSH per medical record, but no Synthroid  in patient's med rec TSH 4.730, free T4 will be checked Confirm with pharmacy for updated med rec  Elevated MCV MCV 105.4, vitamin B12 and folate levels will be checked  Fall at home Continue fall precautions Continue PT/OT eval and  treat  Chronic hypotension Continue midodrine  per home regimen  Persistent atrial fibrillation Continuing Xarelto  Hold metoprolol  due to soft BP  GERD Continue Protonix   History of VTE Continue Xarelto    Advance Care Planning: Full code  Consults: None  Family Communication: None at bedside  Severity of Illness: The appropriate patient status for this patient is INPATIENT. Inpatient status is judged to be reasonable and necessary in order to provide the required intensity of service to ensure the patient's safety. The patient's presenting symptoms, physical exam findings, and initial radiographic and laboratory data in the context of their chronic comorbidities is felt to place them at high risk for further clinical deterioration. Furthermore, it is not anticipated that the patient will be medically stable for discharge from the hospital within 2 midnights of admission.   * I certify that at the point of admission it is my clinical judgment that the patient will require inpatient hospital care spanning beyond 2 midnights from the point of admission due to high intensity of service, high risk for further deterioration and high frequency of surveillance required.*  Author: Leiah Giannotti, DO 12/27/2024 8:23 PM  For on call review [email or web address].      [1]  Allergies Allergen Reactions   Sulfa Antibiotics Nausea And Vomiting and Other (See Comments)    Bad reaction as a teenager- only occurred with a tablet/oral formulation   "

## 2024-12-27 NOTE — ED Notes (Signed)
 Got patient on the monitor patient has call bell in reach

## 2024-12-27 NOTE — ED Provider Triage Note (Signed)
 Emergency Medicine Provider Triage Evaluation Note  Stacy Valencia , a 89 y.o. female  was evaluated in triage.  Pt complains of severe back pain, nausea, vomiting, palpitations, lightheadedness in the setting hypotension, near fever, and tachycardia with A-fib/RVR.  Review of Systems  Positive: 1 day of nausea, vomiting, severe back pain, vital sign abnormalities Negative: Constipation, diarrhea, urinary changes.  No congestion or cough  Physical Exam  BP 91/63 (BP Location: Left Arm)   Pulse (!) 129   Temp 99 F (37.2 C) (Oral)   Ht 5' 6 (1.676 m)   Wt 53.5 kg   SpO2 91%   BMI 19.05 kg/m  Gen:   Awake, complaining of severe back pain with her tachycardia and hypotension Resp:  Normal effort  MSK:   Moves extremities without difficulty  Other:  Bowel sounds difficult to auscultate initially  Medical Decision Making  Medically screening exam initiated at 1:57 PM.  Appropriate orders placed.  Stacy Valencia was informed that the remainder of the evaluation will be completed by another provider, this initial triage assessment does not replace that evaluation, and the importance of remaining in the ED until their evaluation is complete.  Stacy Valencia is a 89 y.o. female with a past medical history significant for paroxysmal atrial fibrillation on Xarelto , previous choledocholithiasis and cholecystitis, previous saddle pulmonary embolism, GERD, and previous UTIs who presents with nausea, vomiting, fatigue, weakness, severe back pain, and was found to be near febrile, tachycardic and hypotensive.  According to patient, she had nausea and vomiting and was too weak to get up.  She reports severe pain across her back but is denying initial abdominal discomfort.  She denies any constipation or diarrhea.  Denies any palpitations.  She denies syncope.  Denies other complaints but is perseverating on needing to lay down because of her back.  On exam, she is ill-appearing.  Dry mucous membranes.   She is warm to the touch.  Chest is nontender but she is tachycardic in the 130s.  Blood pressure is 90 systolic initially.  Abdomen seemed distended and I did not hear good bowel sounds but was not focally tender.  She had intact pulses on my exam and legs do not have significant edema.  Will try to get patient a room soon as possible.  Vital signs are concerning.  Will order some fluids and get screening labs.  Will get a CT on pelvis to look for obstruction given the distention and vomiting and decreased bowel sounds.  Will get chest x-ray given her vital signs look for coinfection will get urinalysis.  Will get other labs.  Anticipate admission based on her vital signs and will wait for further workup and patient by another provider.      Jacqlyn Marolf, Lonni JINNY, MD 12/27/24 1401

## 2024-12-27 NOTE — Sepsis Progress Note (Signed)
 Sepsis protocol monitored by eLink

## 2024-12-27 NOTE — ED Notes (Signed)
Full linen change 

## 2024-12-27 NOTE — ED Provider Notes (Signed)
 " [facility] Provider Note   CSN: [id number] Arrival date & time: 12/27/24  1218     History  Chief Complaint  Patient presents with   Emesis   Weakness    Stacy Valencia is a 89 y.o. female with A-fib on xarelto , HFpEF, VTE, chronic hypotension on midodrine  who presents with severe back pain, nausea, vomiting, palpitations, lightheadedness in the setting hypotension, near fever, and tachycardia with A-fib/RVR. Stacy Valencia   Patient states that she lives at home with her husband.  She states that she slipped off the end of a stool onto the floor and got stuck on the floor.  She states her husband could not get her up so he had to call 911.  She states that she feels fine.  She denies any chest pain, headache, neck pain, abdominal pain, nausea.  She states she thinks she vomited on herself but does not feel nauseous now.  States she had previously been in her normal state of health.  She is A&O x 3, but I question whether she is oriented to the situation. Denies CP, SOB, cough, urinary sxs.  On arrival she is A-fib with RVR with rates into the 130s and a low-grade fever 16F.  In triage she had endorsed severe back pain but denies that now. EMS gave 300 cc of fluid. HR now in 100s.  Past Medical History:  Diagnosis Date   Acute cholecystitis 04/11/2024   Adjustment disorder with anxious mood    Atrial fibrillation (HCC) 11/19/2023   Atrial fibrillation with RVR (HCC) 11/20/2023   Choledocholithiasis 04/10/2024   DVT (deep venous thrombosis) (HCC) 01/07/2013   Femur fracture, left (HCC) 07/25/2020   GERD (gastroesophageal reflux disease)    Hip fracture (HCC) 07/22/2020   PE (pulmonary embolism) 12/23/2003   Pulmonary embolism (HCC) 01/05/2013   Rhabdomyolysis 11/19/2023       Home Medications Prior to Admission medications  Medication Sig Start Date End Date Taking? Authorizing Provider  acetaminophen  (TYLENOL ) 500 MG tablet Take 500 mg by  mouth every 6 (six) hours as needed for mild pain (pain score 1-3) or headache.    [provider]  calcium  carbonate (TUMS - DOSED IN MG ELEMENTAL CALCIUM ) 500 MG chewable tablet Chew 2 tablets (400 mg of elemental calcium  total) by mouth 4 (four) times daily as needed for indigestion or heartburn. Patient taking differently: Chew 1-2 tablets by mouth 4 (four) times daily as needed for indigestion or heartburn. 06/10/24   Laurence Locus, DO  Cholecalciferol (VITAMIN D3) 1000 units CAPS Take 1,000-2,000 Units by mouth in the morning.    [provider]  metoprolol  tartrate (LOPRESSOR ) 25 MG tablet Take 0.5 tablets (12.5 mg total) by mouth 2 (two) times daily. Patient taking differently: Take 12.5 mg by mouth 2 (two) times daily. Take 12.5 mg by mouth in the morning and early afternoon 08/09/20   Angiulli, Toribio JINNY, PA-C  midodrine  (PROAMATINE ) 10 MG tablet Take 1 tablet (10 mg total) by mouth 2 (two) times daily with a meal. 08/03/24   Dennise Lavada POUR, MD  mirtazapine  (REMERON ) 15 MG tablet Take 1 tablet (15 mg total) by mouth at bedtime. Patient not taking: Reported on 07/28/2024 06/10/24 09/08/24  Laurence Locus, DO  Multiple Vitamins-Minerals (CENTRUM SILVER WOMEN 50+) TABS Take 0.5 tablets by mouth 2 (two) times daily with a meal.    [provider]  pantoprazole  (PROTONIX ) 40 MG tablet Take 1 tablet (40 mg total) by  mouth daily. 08/03/24   Singh, Prashant K, MD  protein supplement shake (PREMIER PROTEIN) LIQD Take 11 oz by mouth See admin instructions. Drink 11 ounces of a warmed, CHOCOLATE shake by mouth one to two times a day as needed for meal supplementation    [provider]  thiamine  (VITAMIN B-1) 100 MG tablet Take 1 tablet (100 mg total) by mouth daily. 06/11/24   Laurence Locus, DO  XARELTO  20 MG TABS tablet TAKE 1 TABLET BY MOUTH EVERY DAY Patient taking differently: Take 20 mg by mouth daily at 6 PM. 04/15/22   Amadeo Windell SAILOR, MD      Allergies    Sulfa antibiotics     Review of Systems   Review of Systems A 10 point review of systems was performed and is negative unless otherwise reported in HPI.  Physical Exam Updated Vital Signs BP 110/64 (BP Location: Left Arm)   Pulse 92   Temp 99 F (37.2 C) (Oral)   Resp 20   Ht 5' 6 (1.676 m)   Wt 53.5 kg   SpO2 99%   BMI 19.05 kg/m  Physical Exam General: Elderly female, lying in bed. Vomit on her shirt  HEENT: NCAT, no midline C-spine tenderness with patient deformities or step-offs.  PERRLA, Sclera anicteric, MMM, trachea midline.  No facial trauma noted.  Edentulous. Cardiology: RRR, no murmurs/rubs/gallops.  Resp: Normal respiratory rate and effort. CTAB, no wheezes, rhonchi, crackles.  Abd: Soft, non-tender, non-distended. No rebound tenderness or guarding.  Pelvis: Pelvis stable nontender MSK: No peripheral edema or signs of trauma. Extremities without deformity or TTP. No cyanosis or clubbing. Skin: warm, dry. Back: No CVA tenderness Neuro: A&Ox3-4, CNs II-XII grossly intact. MAEs. Sensation grossly intact.  Psych: Irritable, intermittently uncooperative  ED Results / Procedures / Treatments   Labs (all labs ordered are listed, but only abnormal results are displayed) Labs Reviewed  CBG MONITORING, ED - Abnormal; Notable for the following components:      Result Value   Glucose-Capillary 202 (*)    All other components within normal limits  COMPREHENSIVE METABOLIC PANEL WITH GFR  CBC  URINALYSIS, ROUTINE W REFLEX MICROSCOPIC  TSH  LIPASE, BLOOD  MAGNESIUM   I-STAT CG4 LACTIC ACID, ED  TROPONIN T, HIGH SENSITIVITY    EKG EKG Interpretation Date/Time:  Tuesday December 27 2024 12:32:21 EST Ventricular Rate:  128 PR Interval:    QRS Duration:  70 QT Interval:  346 QTC Calculation: 505 R Axis:   -15  Text Interpretation: Atrial fibrillation with rapid ventricular response When compared with ECG of 28-Jul-2024 11:17, PREVIOUS ECG IS PRESENT Confirmed by Cottie Cough [id number])  on 12/27/2024 2:33:15 PM  Radiology CT ABDOMEN PELVIS W CONTRAST Result Date: 12/27/2024 EXAM: CT ABDOMEN AND PELVIS WITH CONTRAST 12/27/2024 05:28:59 PM TECHNIQUE: CT of the abdomen and pelvis was performed with the administration of 100 mL of iohexol  (OMNIPAQUE ) 350 MG/ML injection. Multiplanar reformatted images are provided for review. Automated exposure control, iterative reconstruction, and/or weight-based adjustment of the mA/kV was utilized to reduce the radiation dose to as low as reasonably achievable. COMPARISON: None available. CLINICAL HISTORY: Bowel obstruction suspected; Severe back pain, tachycardia, hypotension, nausea, vomiting. FINDINGS: LOWER CHEST: Redemonstrated bronchial atelectasis dependently within both lower lobes, otherwise unchanged. No new airspace consolidation or pleural effusion. Multivessel coronary atherosclerosis. A few 3 mm clustered nodules in the right middle lobe, probably infectious or inflammatory. LIVER: The liver is unremarkable. GALLBLADDER AND BILE DUCTS: Similar pneumobilia within the intrahepatic and extrahepatic biliary  tree, probably due to recent ERCP or sphincterotomy. No radiopaque gallstones. SPLEEN: No acute abnormality. PANCREAS: Diffuse pancreatic parenchymal atrophy. ADRENAL GLANDS: No acute abnormality. KIDNEYS, URETERS AND BLADDER: A couple of small renal cysts again noted. Per consensus, no follow-up is needed for simple Bosniak type 1 and 2 renal cysts, unless the patient has a malignancy history or risk factors. No stones in the kidneys or ureters. No hydronephrosis. No perinephric or periureteral stranding. Urinary bladder is unremarkable. GI AND BOWEL: Small hiatal hernia. The appendix was not visualized. No right lower quadrant or pericecal inflammatory change to suggest acute appendicitis. Scattered total colonic diverticulosis, most severe within the sigmoid colon. Stomach demonstrates no acute abnormality. There is no bowel obstruction. PERITONEUM  AND RETROPERITONEUM: No ascites. No free air. VASCULATURE: Diffuse aortoiliac atherosclerosis. Aorta is normal in caliber. LYMPH NODES: No lymphadenopathy. REPRODUCTIVE ORGANS: No acute abnormality. BONES AND SOFT TISSUES: Diffuse osteopenia. Multilevel degenerative disc disease throughout the spine. Left femoral intramedullary nail partially visualized. Heterotopic bone formation about the left femoral fracture. Chronic severe compression fractures of L2 and L3. Moderate chronic compression fracture of T12. No focal soft tissue abnormality. IMPRESSION: 1. No acute intraabdominal or pelvic abnormality. 2. Similar appearance of multiple additional chronic findings, as described above in the body of the report. Electronically signed by: Rogelia Myers MD 12/27/2024 06:04 PM EST RP Workstation: [id number]   DG Chest Port 1 View Result Date: 12/27/2024 CLINICAL DATA:  Back pain with tachycardia, hypotension, nausea and vomiting. EXAM: PORTABLE CHEST 1 VIEW COMPARISON:  July 28, 2024 FINDINGS: The heart size and mediastinal contours are within normal limits. Low lung volumes are noted. A mild chronic appearing increased lung markings are seen. No acute infiltrate, pleural effusion or pneumothorax is identified. Multilevel degenerative changes are present throughout the thoracic spine. IMPRESSION: Low lung volumes without acute or active cardiopulmonary disease. Electronically Signed   By: Suzen Dials M.D.   On: 12/27/2024 17:52    Procedures .Critical Care  Performed by: Franklyn Sid SAILOR, MD Authorized by: Franklyn Sid SAILOR, MD   Critical care provider statement:    Critical care time (minutes):  30   Critical care was necessary to treat or prevent imminent or life-threatening deterioration of the following conditions:  Sepsis   Critical care was time spent personally by me on the following activities:  Development of treatment plan with patient or surrogate, discussions with consultants, evaluation of  patient's response to treatment, examination of patient, ordering and review of laboratory studies, ordering and review of radiographic studies, ordering and performing treatments and interventions, pulse oximetry, re-evaluation of patient's condition, review of old charts and obtaining history from patient or surrogate   Care discussed with: admitting provider       Medications Ordered in ED Medications  lactated ringers  infusion ( Intravenous New Bag/Given 12/27/24 1703)  lactated ringers  bolus 1,000 mL (has no administration in time range)  magnesium  sulfate IVPB 2 g 50 mL (has no administration in time range)  ceFEPIme  (MAXIPIME ) 2 g in sodium chloride  0.9 % 100 mL IVPB (has no administration in time range)  acetaminophen  (TYLENOL ) tablet 650 mg (has no administration in time range)    Or  acetaminophen  (TYLENOL ) suppository 650 mg (has no administration in time range)  prochlorperazine  (COMPAZINE ) injection 10 mg (has no administration in time range)  sodium chloride  0.9 % bolus 500 mL (0 mLs Intravenous Stopped 12/27/24 1758)  ceFEPIme  (MAXIPIME ) 2 g in sodium chloride  0.9 % 100 mL IVPB (0 g Intravenous  Stopped 12/27/24 1728)  iohexol  (OMNIPAQUE ) 350 MG/ML injection 100 mL (100 mLs Intravenous Contrast Given 12/27/24 1729)    ED Course/ Medical Decision Making/ A&P                          Medical Decision Making Amount and/or Complexity of Data Reviewed Labs: ordered. Decision-making details documented in ED Course. Radiology:  Decision-making details documented in ED Course.  Risk Prescription drug management. Decision regarding hospitalization.    This patient presents to the ED for concern of hypotension/fever, this involves an extensive number of treatment options, and is a complaint that carries with it a high risk of complications and morbidity.  I considered the following differential and admission for this acute, potentially life threatening condition. No longer hypotensive  on arrival to ED.   MDM:    Reported fever and hypotension, concern for sepsis.  Indeed she has a leukocytosis and an elevated lactate to 4.7, called code sepsis and ordered blood cultures IV fluids and antibiotics IV cefepime .  She initially refused the x-ray but then accepted - no PNA or pulm edema/pleural effusion.  Urine pending, patient refusing collection or in and out cath.  Patient does state at some point that she thinks she does have a UTI. I do suspect this is likely the source of her SIRS+. She reported that she fell but does not seem to have any evidence of head trauma or any other injuries.  She initially complained of back pain but now denies it. Her BP is stable in 100s-110s while in the ED. CT ordered from triage doesn't show any surgical source of sepsis. Lactic decreased from 4.7 --> 3.5.   Clinical Course as of 12/27/24 1925  Tue Dec 27, 2024  1501 Glucose-Capillary(!): 202 [HN]  1614 WBC(!): 20.4 +leukocytosis [HN]  1614 Lactic Acid, Venous(!!): 4.7 +elevated lactic Ordering blood cultures, IV fluids, and abx for code sepsis [HN]  1809 CT ABDOMEN PELVIS W CONTRAST 1. No acute intraabdominal or pelvic abnormality. 2. Similar appearance of multiple additional chronic findings, as described above in the body of the report.   [HN]    Clinical Course User Index [HN] Franklyn Sid SAILOR, MD    Labs: I Ordered, and personally interpreted labs.  The pertinent results include: Those listed above  Imaging Studies ordered: I ordered imaging studies including chest x-ray I independently visualized and interpreted imaging. I agree with the radiologist interpretation  Additional history obtained from chart review, EMS.    Cardiac Monitoring: The patient was maintained on a cardiac monitor.  I personally viewed and interpreted the cardiac monitored which showed an underlying rhythm of: Afib, rate controlled  Reevaluation: After the interventions noted above, I reevaluated the  patient and found that they have :improved  Social Determinants of Health:  lives independently with her husband  Disposition:  admit to hospitalist for management; urine pending  Co morbidities that complicate the patient evaluation  Past Medical History:  Diagnosis Date   Acute cholecystitis 04/11/2024   Adjustment disorder with anxious mood    Atrial fibrillation (HCC) 11/19/2023   Atrial fibrillation with RVR (HCC) 11/20/2023   Choledocholithiasis 04/10/2024   DVT (deep venous thrombosis) (HCC) 01/07/2013   Femur fracture, left (HCC) 07/25/2020   GERD (gastroesophageal reflux disease)    Hip fracture (HCC) 07/22/2020   PE (pulmonary embolism) 12/23/2003   Pulmonary embolism (HCC) 01/05/2013   Rhabdomyolysis 11/19/2023     Medicines Meds ordered this encounter  Medications   sodium chloride  0.9 % bolus 500 mL    I have reviewed the patients home medicines and have made adjustments as needed  Problem List / ED Course: Problem List Items Addressed This Visit       Other   SIRS (systemic inflammatory response syndrome) (HCC) - Primary                  This note was created using dictation software, which may contain spelling or grammatical errors.    Franklyn Sid SAILOR, MD 12/27/24 2244  "

## 2024-12-27 NOTE — ED Triage Notes (Signed)
 Pt to er via ems, per ems pt is here for one episode of vomiting, states that she was weak and couldn't get off of the toilet.  States that she lives at home with her husband. States that they placed and iv and gave of fluid.

## 2024-12-27 NOTE — ED Notes (Signed)
 Pt advised urine sample is needed, unable to give me sample at this time.

## 2024-12-28 DIAGNOSIS — R6510 Systemic inflammatory response syndrome (SIRS) of non-infectious origin without acute organ dysfunction: Secondary | ICD-10-CM | POA: Diagnosis not present

## 2024-12-28 DIAGNOSIS — I9589 Other hypotension: Secondary | ICD-10-CM | POA: Diagnosis not present

## 2024-12-28 LAB — CBC
HCT: 35.9 % — ABNORMAL LOW (ref 36.0–46.0)
Hemoglobin: 11.8 g/dL — ABNORMAL LOW (ref 12.0–15.0)
MCH: 33.8 pg (ref 26.0–34.0)
MCHC: 32.9 g/dL (ref 30.0–36.0)
MCV: 102.9 fL — ABNORMAL HIGH (ref 80.0–100.0)
Platelets: 154 K/uL (ref 150–400)
RBC: 3.49 MIL/uL — ABNORMAL LOW (ref 3.87–5.11)
RDW: 14.5 % (ref 11.5–15.5)
WBC: 15 K/uL — ABNORMAL HIGH (ref 4.0–10.5)
nRBC: 0 % (ref 0.0–0.2)

## 2024-12-28 LAB — COMPREHENSIVE METABOLIC PANEL WITH GFR
ALT: 13 U/L (ref 0–44)
AST: 28 U/L (ref 15–41)
Albumin: 3.3 g/dL — ABNORMAL LOW (ref 3.5–5.0)
Alkaline Phosphatase: 57 U/L (ref 38–126)
Anion gap: 8 (ref 5–15)
BUN: 19 mg/dL (ref 8–23)
CO2: 25 mmol/L (ref 22–32)
Calcium: 9.2 mg/dL (ref 8.9–10.3)
Chloride: 105 mmol/L (ref 98–111)
Creatinine, Ser: 0.74 mg/dL (ref 0.44–1.00)
GFR, Estimated: >60 mL/min
Glucose, Bld: 89 mg/dL (ref 70–99)
Potassium: 3.6 mmol/L (ref 3.5–5.1)
Sodium: 138 mmol/L (ref 135–145)
Total Bilirubin: 0.9 mg/dL (ref 0.0–1.2)
Total Protein: 5.9 g/dL — ABNORMAL LOW (ref 6.5–8.1)

## 2024-12-28 LAB — I-STAT CG4 LACTIC ACID, ED
Lactic Acid, Venous: 1.5 mmol/L (ref 0.5–1.9)
Lactic Acid, Venous: 2.5 mmol/L (ref 0.5–1.9)

## 2024-12-28 LAB — FOLATE: Folate: >20 ng/mL

## 2024-12-28 LAB — VITAMIN B12: Vitamin B-12: 565 pg/mL (ref 180–914)

## 2024-12-28 LAB — VITAMIN D 25 HYDROXY (VIT D DEFICIENCY, FRACTURES): Vit D, 25-Hydroxy: 62.8 ng/mL (ref 30–100)

## 2024-12-28 LAB — MAGNESIUM: Magnesium: 2.3 mg/dL (ref 1.7–2.4)

## 2024-12-28 LAB — PHOSPHORUS: Phosphorus: 2.1 mg/dL — ABNORMAL LOW (ref 2.5–4.6)

## 2024-12-28 LAB — T4, FREE: Free T4: 0.98 ng/dL (ref 0.80–2.00)

## 2024-12-28 MED ORDER — K PHOS MONO-SOD PHOS DI & MONO 155-852-130 MG PO TABS
500.0000 mg | ORAL_TABLET | Freq: Once | ORAL | Status: AC
  Administered 2024-12-28: 500 mg via ORAL
  Filled 2024-12-28: qty 2

## 2024-12-28 MED ORDER — MIDODRINE HCL 5 MG PO TABS
10.0000 mg | ORAL_TABLET | Freq: Two times a day (BID) | ORAL | Status: DC
  Administered 2024-12-28: 10 mg via ORAL
  Filled 2024-12-28: qty 2

## 2024-12-28 MED ORDER — RIVAROXABAN 10 MG PO TABS: 20.0000 mg | ORAL_TABLET | Freq: Every day | ORAL | Status: DC

## 2024-12-28 MED ORDER — RIVAROXABAN 20 MG PO TABS: 20.0000 mg | ORAL_TABLET | Freq: Every day | ORAL | Status: DC

## 2024-12-28 MED ORDER — PANTOPRAZOLE SODIUM 40 MG PO TBEC
40.0000 mg | DELAYED_RELEASE_TABLET | Freq: Every day | ORAL | Status: DC
  Administered 2024-12-28: 40 mg via ORAL
  Filled 2024-12-28: qty 1

## 2024-12-28 NOTE — ED Notes (Signed)
 Updated patient's spouse via phone per his request on patient's status. All questions answered. Pt's spouse verbalized frustration regarding communication and continuity of care.

## 2024-12-28 NOTE — Discharge Summary (Signed)
 "  Physician Discharge Summary  Stacy Valencia FMW:[id number] DOB: [birth year] DOA: 12/27/2024  PCP: Kip Righter, MD  Admit date: 12/27/2024 Discharge date: 12/28/2024  Admitted From: Home  Discharge disposition: Home with home health   Recommendations for Outpatient Follow-Up:   Follow up with your primary care provider in one week.  Check CBC, BMP, magnesium  in the next visit Patient will need to take precautions while standing up from lying down position.   Discharge Diagnosis:   Active Problems:   SIRS (systemic inflammatory response syndrome) (HCC)   Discharge Condition: Improved.  Diet recommendation: Low sodium, heart healthy.   Wound care: None.  Code status: Full.   History of Present Illness:   Stacy Valencia is a 89 y.o. female with past medical history significant of of A-fib on Xarelto , HFpEF, VTE, chronic hypotension on midodrine  presented to hospital after sustaining a fall at home with inability to get up.  EMS was called in and patient was noted to be hypotensive with history of chronic hypotension.  Patient was then brought into the hospital.  In the ED patient was slightly tachycardic.  Labs were notable for WBC of 20.4.  BMP was notable for bicarb of 19 with a creatinine of 1.0 from baseline 0.8-0.9.  Lactate was elevated at 4.7  magnesium  1.5, troponin x 1 - 24.  Respiratory panel was normal. CT abdomen and pelvis and chest x-ray showed no acute findings.  Patient was then admitted to the hospital for further evaluation and treatment.   Hospital Course:   Following conditions were addressed during hospitalization as listed below,  Fall, generalized weakness.  Could be secondary to volume depletion and hypotension.  TSH free T4 within normal limits.  Vitamin B12 565 with folate more than 20.  Will get PT evaluation.  Fall precautions.  On calcium  supplements at home.  Vitamin D  level was 62.  SIRS  Had leukocytosis, tachypnea, tachycardia (met SIRS  criteria without obvious source of infection.).  CT abdomen pelvis and chest x-ray showed no acute findings.  Blood cultures negative in less than 24 hours.  Patient still has leukocytosis and elevated lactate.  Patient was started on antibiotic for presumed UTI but urinalysis however with no white cells in urine and negative nitrate.  Antibiotics has been discontinued.    Lactic acidosis Lactic acid 4.7 > 3.5 and  Latest lactic acid level at 1.5 and has normalized.  Likely secondary to hypotension and volume depletion.  Hypomagnesemia Mg level is 1.5 on presentation.  Received magnesium  supplementation and latest magnesium  at 2.3.  Hypophosphatemia.  Phosphorus level of 2.1 today.  Will replace with Neutra-Phos prior to discharge.  Prolonged QT interval QTc 525 ms on presentation.  Magnesium  was replenished.  Potassium of 3.6.    Elevated MCV MCV 105.4, vitamin B12 and folate level adequate.  Thyroid  function within normal limits.  Hemoglobin at 11.8.  Platelets at 154.   Chronic hypotension Continue midodrine  per home regimen.  Advised orthostatic precautions at home.   Persistent atrial fibrillation Will resume metoprolol  on discharge.  Continue Xarelto .   GERD Continue Protonix    History of VTE Continue Xarelto    Disposition.  At this time, patient is stable for disposition home with outpatient PCP follow-up.  Communicated with the patient's spouse prior to discharge  Medical Consultants:   None.  Procedures:    None Subjective:   Today, patient was seen and examined at bedside.  Feels much better.  Denies any dizziness lightheadedness shortness of  breath.  Seen by PT and no skilled therapy needs were identified.  Discharge Exam:   Vitals:   12/28/24 1125 12/28/24 1138  BP: 112/61 (!) 125/50  Pulse: 70 63  Resp: 18 17  Temp:    SpO2: 100% 97%   Vitals:   12/28/24 0748 12/28/24 0800 12/28/24 1125 12/28/24 1138  BP:  103/80 112/61 (!) 125/50  Pulse:  73 70 63   Resp:  16 18 17   Temp: 98 F (36.7 C)     TempSrc: Oral     SpO2:  100% 100% 97%  Weight:      Height:       Body mass index is 19.05 kg/m.   General: Alert awake, , elderly female, Communicative, not in obvious distress. HENT: pupils equally reacting to light,  No scleral pallor or icterus noted. Oral mucosa is moist.  Chest:  Clear breath sounds.  No crackles or wheezes.  CVS: S1 &S2 heard. No murmur.  Regular rate and rhythm. Abdomen: Soft, nontender, nondistended.  Bowel sounds are heard.   Extremities: No cyanosis, clubbing or edema.  Peripheral pulses are palpable. Psych: Alert, awake and oriented, normal mood CNS:  No cranial nerve deficits.  Power equal in all extremities.   Skin: Warm and dry.  No rashes noted.  The results of significant diagnostics from this hospitalization (including imaging, microbiology, ancillary and laboratory) are listed below for reference.     Diagnostic Studies:   CT ABDOMEN PELVIS W CONTRAST Result Date: 12/27/2024 EXAM: CT ABDOMEN AND PELVIS WITH CONTRAST 12/27/2024 05:28:59 PM TECHNIQUE: CT of the abdomen and pelvis was performed with the administration of 100 mL of iohexol  (OMNIPAQUE ) 350 MG/ML injection. Multiplanar reformatted images are provided for review. Automated exposure control, iterative reconstruction, and/or weight-based adjustment of the mA/kV was utilized to reduce the radiation dose to as low as reasonably achievable. COMPARISON: None available. CLINICAL HISTORY: Bowel obstruction suspected; Severe back pain, tachycardia, hypotension, nausea, vomiting. FINDINGS: LOWER CHEST: Redemonstrated bronchial atelectasis dependently within both lower lobes, otherwise unchanged. No new airspace consolidation or pleural effusion. Multivessel coronary atherosclerosis. A few 3 mm clustered nodules in the right middle lobe, probably infectious or inflammatory. LIVER: The liver is unremarkable. GALLBLADDER AND BILE DUCTS: Similar pneumobilia within  the intrahepatic and extrahepatic biliary tree, probably due to recent ERCP or sphincterotomy. No radiopaque gallstones. SPLEEN: No acute abnormality. PANCREAS: Diffuse pancreatic parenchymal atrophy. ADRENAL GLANDS: No acute abnormality. KIDNEYS, URETERS AND BLADDER: A couple of small renal cysts again noted. Per consensus, no follow-up is needed for simple Bosniak type 1 and 2 renal cysts, unless the patient has a malignancy history or risk factors. No stones in the kidneys or ureters. No hydronephrosis. No perinephric or periureteral stranding. Urinary bladder is unremarkable. GI AND BOWEL: Small hiatal hernia. The appendix was not visualized. No right lower quadrant or pericecal inflammatory change to suggest acute appendicitis. Scattered total colonic diverticulosis, most severe within the sigmoid colon. Stomach demonstrates no acute abnormality. There is no bowel obstruction. PERITONEUM AND RETROPERITONEUM: No ascites. No free air. VASCULATURE: Diffuse aortoiliac atherosclerosis. Aorta is normal in caliber. LYMPH NODES: No lymphadenopathy. REPRODUCTIVE ORGANS: No acute abnormality. BONES AND SOFT TISSUES: Diffuse osteopenia. Multilevel degenerative disc disease throughout the spine. Left femoral intramedullary nail partially visualized. Heterotopic bone formation about the left femoral fracture. Chronic severe compression fractures of L2 and L3. Moderate chronic compression fracture of T12. No focal soft tissue abnormality. IMPRESSION: 1. No acute intraabdominal or pelvic abnormality. 2. Similar appearance of multiple  additional chronic findings, as described above in the body of the report. Electronically signed by: Rogelia Myers MD 12/27/2024 06:04 PM EST RP Workstation: [id number]   DG Chest Port 1 View Result Date: 12/27/2024 CLINICAL DATA:  Back pain with tachycardia, hypotension, nausea and vomiting. EXAM: PORTABLE CHEST 1 VIEW COMPARISON:  July 28, 2024 FINDINGS: The heart size and mediastinal  contours are within normal limits. Low lung volumes are noted. A mild chronic appearing increased lung markings are seen. No acute infiltrate, pleural effusion or pneumothorax is identified. Multilevel degenerative changes are present throughout the thoracic spine. IMPRESSION: Low lung volumes without acute or active cardiopulmonary disease. Electronically Signed   By: Suzen Dials M.D.   On: 12/27/2024 17:52     Labs:   Basic Metabolic Panel: Recent Labs  Lab 12/27/24 1532 12/28/24 0412  NA 140 138  K 3.8 3.6  CL 105 105  CO2 19* 25  GLUCOSE 179* 89  BUN 24* 19  CREATININE 1.02* 0.74  CALCIUM  9.0 9.2  MG 1.5* 2.3  PHOS  --  2.1*   GFR Estimated Creatinine Clearance: 40.3 mL/min (by C-G formula based on SCr of 0.74 mg/dL). Liver Function Tests: Recent Labs  Lab 12/27/24 1532 12/28/24 0412  AST 39 28  ALT 14 13  ALKPHOS 73 57  BILITOT 0.9 0.9  PROT 6.6 5.9*  ALBUMIN 3.5 3.3*   Recent Labs  Lab 12/27/24 1532  LIPASE <10*   No results for input(s): AMMONIA in the last 168 hours. Coagulation profile Recent Labs  Lab 12/27/24 1615  INR 1.3*    CBC: Recent Labs  Lab 12/27/24 1532 12/28/24 0412  WBC 20.4* 15.0*  HGB 14.1 11.8*  HCT 42.9 35.9*  MCV 105.4* 102.9*  PLT 149* 154   Cardiac Enzymes: No results for input(s): CKTOTAL, CKMB, CKMBINDEX, TROPONINI in the last 168 hours. BNP: Invalid input(s): POCBNP CBG: Recent Labs  Lab 12/27/24 1413  GLUCAP 202*   D-Dimer No results for input(s): DDIMER in the last 72 hours. Hgb A1c No results for input(s): HGBA1C in the last 72 hours. Lipid Profile No results for input(s): CHOL, HDL, LDLCALC, TRIG, CHOLHDL, LDLDIRECT in the last 72 hours. Thyroid  function studies Recent Labs    12/27/24 1532  TSH 4.730*   Anemia work up Recent Labs    12/28/24 0412  VITAMINB12 565  FOLATE >20.0   Microbiology Recent Results (from the past 240 hours)  Resp panel by RT-PCR  (RSV, Flu A&B, Covid) Anterior Nasal Swab     Status: None   Collection Time: 12/27/24  4:15 PM   Specimen: Anterior Nasal Swab  Result Value Ref Range Status   SARS Coronavirus 2 by RT PCR NEGATIVE NEGATIVE Final   Influenza A by PCR NEGATIVE NEGATIVE Final   Influenza B by PCR NEGATIVE NEGATIVE Final    Comment: (NOTE) The Xpert Xpress SARS-CoV-2/FLU/RSV plus assay is intended as an aid in the diagnosis of influenza from Nasopharyngeal swab specimens and should not be used as a sole basis for treatment. Nasal washings and aspirates are unacceptable for Xpert Xpress SARS-CoV-2/FLU/RSV testing.  Fact Sheet for Patients: bloggercourse.com  Fact Sheet for Healthcare Providers: seriousbroker.it  This test is not yet approved or cleared by the [place]  FDA and has been authorized for detection and/or diagnosis of SARS-CoV-2 by FDA under an Emergency Use Authorization (EUA). This EUA will remain in effect (meaning this test can be used) for the duration of the COVID-19 declaration under Section 564(b)(1) of the  Act, 21 U.S.C. section 360bbb-3(b)(1), unless the authorization is terminated or revoked.     Resp Syncytial Virus by PCR NEGATIVE NEGATIVE Final    Comment: (NOTE) Fact Sheet for Patients: bloggercourse.com  Fact Sheet for Healthcare Providers: seriousbroker.it  This test is not yet approved or cleared by the [place]  FDA and has been authorized for detection and/or diagnosis of SARS-CoV-2 by FDA under an Emergency Use Authorization (EUA). This EUA will remain in effect (meaning this test can be used) for the duration of the COVID-19 declaration under Section 564(b)(1) of the Act, 21 U.S.C. section 360bbb-3(b)(1), unless the authorization is terminated or revoked.  Performed at [facility], [address], [place], [place] [address]   Blood Culture  (routine x 2)     Status: None (Preliminary result)   Collection Time: 12/27/24  4:50 PM   Specimen: BLOOD  Result Value Ref Range Status   Specimen Description BLOOD LEFT ANTECUBITAL  Final   Special Requests   Final    BOTTLES DRAWN AEROBIC AND ANAEROBIC Blood Culture results may not be optimal due to an inadequate volume of blood received in culture bottles   Culture   Final    NO GROWTH < 24 HOURS Performed at [facility], [address], [place], [place] [address]    Report Status PENDING  Incomplete  Blood Culture (routine x 2)     Status: None (Preliminary result)   Collection Time: 12/27/24  6:12 PM   Specimen: BLOOD LEFT FOREARM  Result Value Ref Range Status   Specimen Description BLOOD LEFT FOREARM  Final   Special Requests   Final    BOTTLES DRAWN AEROBIC ONLY Blood Culture results may not be optimal due to an inadequate volume of blood received in culture bottles   Culture   Final    NO GROWTH < 12 HOURS Performed at [facility], [address], [place], [place] [address]    Report Status PENDING  Incomplete     Discharge Instructions:   Discharge Instructions     Discharge instructions   Complete by: As directed    Follow-up with your primary care provider in 1 week.  Check blood work at that time.  Seek medical attention for worsening symptoms.  Take time to change positions.  Keep yourself hydrated.   Increase activity slowly   Complete by: As directed       Allergies as of 12/28/2024       Reactions   Sulfa Antibiotics Nausea And Vomiting, Other (See Comments)   Bad reaction as a teenager- only occurred with a tablet/oral formulation        Medication List     STOP taking these medications    mirtazapine  15 MG tablet Commonly known as: REMERON    pantoprazole  40 MG tablet Commonly known as: PROTONIX    thiamine  100 MG tablet Commonly known as: Vitamin B-1       TAKE these medications    acetaminophen  500 MG  tablet Commonly known as: TYLENOL  Take 500 mg by mouth every 6 (six) hours as needed for mild pain (pain score 1-3) or headache.   calcium  carbonate 500 MG chewable tablet Commonly known as: TUMS - dosed in mg elemental calcium  Chew 2 tablets (400 mg of elemental calcium  total) by mouth 4 (four) times daily as needed for indigestion or heartburn. What changed: how much to take   Centrum Silver Women 50+ Tabs Take 0.5 tablets by mouth 2 (  two) times daily with a meal.   metoprolol  tartrate 25 MG tablet Commonly known as: LOPRESSOR  Take 0.5 tablets (12.5 mg total) by mouth 2 (two) times daily. What changed: additional instructions   midodrine  5 MG tablet Commonly known as: PROAMATINE  Take 5 mg by mouth 2 (two) times daily with a meal.   omeprazole  20 MG capsule Commonly known as: PRILOSEC  Take 20 mg by mouth daily.   protein supplement shake Liqd Commonly known as: PREMIER PROTEIN Take 11 oz by mouth See admin instructions. Drink 11 ounces of a warmed, CHOCOLATE shake by mouth one to two times a day as needed for meal supplementation   Vitamin D3 1000 units Caps Take 1,000-2,000 Units by mouth in the morning.   Xarelto  20 MG Tabs tablet Generic drug: rivaroxaban  TAKE 1 TABLET BY MOUTH EVERY DAY What changed:  how much to take when to take this        Contact information for follow-up providers     Kip Righter, MD Follow up in 1 week(s).   Specialty: Family Medicine Contact information: [address] [place] [address] [phone]              Contact information for after-discharge care     Home Medical Care     [facility]) Follow up.   Service: Home Health Services Contact information: [address] [place]  [address] [phone]                      Time coordinating discharge: 39 minutes  Signed:  Jessieca Rhem  [facility] 12/28/2024, 3:13  PM          "

## 2024-12-28 NOTE — ED Notes (Signed)
 Pt utilized bedpan with success. Pt continent of urine. Pt noted with skin breakdown to left gluteal cleft.  No drainage noted. Pt states her husband has been tending to area with creams and lotions. Pt call bell within reach. Bed in low and locked position.

## 2024-12-28 NOTE — Hospital Course (Addendum)
 [name]

## 2024-12-28 NOTE — ED Notes (Signed)
 Pt transferred to hospital bed and moved to hallway 26. Pt educated on use of bed. Pt was able to complete return demo of use. Pt denies any further needs at this time. Handoff report given to Jordan RN.

## 2024-12-28 NOTE — Evaluation (Signed)
 Physical Therapy Evaluation Patient Details Name: Stacy Valencia MRN: [id number] DOB: July 29, 1933 Today's Date: 12/28/2024  History of Present Illness  89 yo F adm 1/6 s/p fall followed by emesis and weakness. Pt with SIRS with suspicion for sepsis PMH afib on xarelto , HFpEF, VTE,chronic hypotension, UTI, falls, osteoporosis, PE, GERD, L hip IM nail 2021.  Clinical Impression  Pt admitted with above diagnosis and presents to PT with functional limitations due to deficits listed below (See PT problem list). Pt needs skilled PT to maximize independence and safety. Pt lives with husband who provides assist at home for gait (supervision/CGA) and ADL's. Pt not far from baseline. Recommend return home with husband and to resume HHPT.          If plan is discharge home, recommend the following: A little help with walking and/or transfers;A little help with bathing/dressing/bathroom;Help with stairs or ramp for entrance;Assist for transportation;Assistance with cooking/housework   Can travel by private vehicle        Equipment Recommendations None recommended by PT  Recommendations for Other Services       Functional Status Assessment Patient has had a recent decline in their functional status and demonstrates the ability to make significant improvements in function in a reasonable and predictable amount of time.     Precautions / Restrictions Precautions Precautions: Fall Recall of Precautions/Restrictions: Intact Restrictions Weight Bearing Restrictions Per Provider Order: No      Mobility  Bed Mobility Overal bed mobility: Needs Assistance Bed Mobility: Supine to Sit, Sit to Supine     Supine to sit: Min assist Sit to supine: Supervision   General bed mobility comments: Assist for pt to pull her trunk into sittin    Transfers Overall transfer level: Needs assistance Equipment used: Rolling walker (2 wheels) Transfers: Sit to/from Stand Sit to Stand: Contact guard assist,  Min assist           General transfer comment: CGA from bed and min assist to power up from low toilet    Ambulation/Gait Ambulation/Gait assistance: Contact guard assist Gait Distance (Feet): 125 Feet Assistive device: Rolling walker (2 wheels) Gait Pattern/deviations: Step-through pattern, Decreased stride length, Decreased step length - right, Decreased step length - left Gait velocity: decr Gait velocity interpretation: 1.31 - 2.62 ft/sec, indicative of limited community ambulator   General Gait Details: Assist for safety  Stairs            Wheelchair Mobility     Tilt Bed    Modified Rankin (Stroke Patients Only)       Balance Overall balance assessment: Needs assistance Sitting-balance support: No upper extremity supported, Feet supported Sitting balance-Leahy Scale: Good     Standing balance support: Single extremity supported, Bilateral upper extremity supported, During functional activity Standing balance-Leahy Scale: Poor Standing balance comment: UE support with walker                             Pertinent Vitals/Pain Pain Assessment Pain Assessment: No/denies pain    Home Living Family/patient expects to be discharged to:: Private residence Living Arrangements: Spouse/significant other Available Help at Discharge: Family;Available 24 hours/day Type of Home: House Home Access: Ramped entrance       Home Layout: One level Home Equipment: Agricultural Consultant (2 wheels);Wheelchair - manual;Shower seat;BSC/3in1;Grab bars - tub/shower;Hand held Sales Executive;Toilet riser Additional Comments: Pt lives at home with husband, has lift chair for standing.    Prior Function Prior  Level of Function : Needs assist;History of Falls (last six months)       Physical Assist : Mobility (physical) Mobility (physical): Gait;Transfers;Bed mobility   Mobility Comments: Husband provides supervision/CGA for ambulation. Has gait belt at  home       Extremity/Trunk Assessment   Upper Extremity Assessment Upper Extremity Assessment: Defer to OT evaluation    Lower Extremity Assessment Lower Extremity Assessment: Generalized weakness       Communication   Communication Communication: Impaired Factors Affecting Communication: Hearing impaired    Cognition Arousal: Alert Behavior During Therapy: WFL for tasks assessed/performed   PT - Cognitive impairments: No apparent impairments                         Following commands: Intact       Cueing Cueing Techniques: Verbal cues     General Comments      Exercises     Assessment/Plan    PT Assessment Patient needs continued PT services  PT Problem List Decreased strength;Decreased balance;Decreased mobility       PT Treatment Interventions DME instruction;Gait training;Functional mobility training;Therapeutic activities;Therapeutic exercise;Balance training;Patient/family education    PT Goals (Current goals can be found in the Care Plan section)  Acute Rehab PT Goals Patient Stated Goal: return home PT Goal Formulation: With patient Time For Goal Achievement: 01/11/25 Potential to Achieve Goals: Good    Frequency Min 2X/week     Co-evaluation               AM-PAC PT 6 Clicks Mobility  Outcome Measure Help needed turning from your back to your side while in a flat bed without using bedrails?: None Help needed moving from lying on your back to sitting on the side of a flat bed without using bedrails?: A Little Help needed moving to and from a bed to a chair (including a wheelchair)?: A Little Help needed standing up from a chair using your arms (e.g., wheelchair or bedside chair)?: A Little Help needed to walk in hospital room?: A Little Help needed climbing 3-5 steps with a railing? : A Little 6 Click Score: 19    End of Session Equipment Utilized During Treatment: Gait belt Activity Tolerance: Patient tolerated  treatment well Patient left: in bed (In ED hallway)   PT Visit Diagnosis: Other abnormalities of gait and mobility (R26.89);Muscle weakness (generalized) (M62.81);History of falling (Z91.81)    Time: 9089-9066 PT Time Calculation (min) (ACUTE ONLY): 23 min   Charges:   PT Evaluation $PT Eval Moderate Complexity: 1 Mod PT Treatments $Gait Training: 8-22 mins PT General Charges $$ ACUTE PT VISIT: 1 Visit         [facility] PT Acute Rehabilitation Services Office [phone]   [facility] ORN [facility] 12/28/2024, 10:32 AM

## 2024-12-28 NOTE — Care Management Obs Status (Signed)
 MEDICARE OBSERVATION STATUS NOTIFICATION   Patient Details  Name: Stacy Valencia MRN: [id number] Date of Birth: 30-Oct-1933   Medicare Observation Status Notification Given:  Yes    Stephane Powell Jansky, RN 12/28/2024, 1:12 PM

## 2024-12-28 NOTE — ED Notes (Signed)
 CCMD called for cardiac monitoring.

## 2024-12-28 NOTE — TOC Initial Note (Addendum)
 Transition of Care (TOC) - Initial/Assessment Note   Patient from home with husband.   PT recommending HHPT. Awaiting OT   Patient has had [facility] in the past and wants them again. Darleene with [facility] accepted referral. NCM secure chatted MD for orders and face to face   Husband requesting ambulance transport home.   Called husband confirmed all of above and address. [facility] paperwork on chart. Husband would like nurse to call him when [facility] arrives to transport patient home.   Asked nurse to notify [facility] when ready for discharge   Called [facility]  Patient Details  Name: Stacy Valencia MRN: [id number] Date of Birth: 01-Jan-1933  Transition of Care [facility]) CM/SW Contact:    Stephane Powell Jansky, RN Phone Number: 12/28/2024, 1:00 PM  Clinical Narrative:                   Expected Discharge Plan: Home w Home Health Services Barriers to Discharge: No Barriers Identified   Patient Goals and CMS Choice Patient states their goals for this hospitalization and ongoing recovery are:: to return home with husband CMS Medicare.gov Compare Post Acute Care list provided to:: Patient Choice offered to / list presented to : Patient      Expected Discharge Plan and Services   Discharge Planning Services: CM Consult Post Acute Care Choice: Home Health Living arrangements for the past 2 months: Single Family Home Expected Discharge Date: 12/28/24               DME Arranged: N/A         HH Arranged: PT, OT HH Agency: [facility] Date [facility] Agency Contacted: 12/28/24 Time HH Agency Contacted: [id number] Representative spoke with at [facility] Agency: [facility]  Prior Living Arrangements/Services Living arrangements for the past 2 months: Single Family Home Lives with:: Spouse Patient language and need for interpreter reviewed:: Yes Do you feel safe going back to the place where you live?: Yes      Need for Family Participation in Patient Care: Yes (Comment) Care giver support system in place?: Yes  (comment) Current home services: DME Criminal Activity/Legal Involvement Pertinent to Current Situation/Hospitalization: No - Comment as needed  Activities of Daily Living   ADL Screening (condition at time of admission) Independently performs ADLs?: Yes (appropriate for developmental age) Is the patient deaf or have difficulty hearing?: No Does the patient have difficulty seeing, even when wearing glasses/contacts?: No Does the patient have difficulty concentrating, remembering, or making decisions?: No  Permission Sought/Granted   Permission granted to share information with : Yes, Verbal Permission Granted  Share Information with NAME: husband, Stacy Valencia  Permission granted to share info w AGENCY: [facility]        Emotional Assessment Appearance:: Appears stated age Attitude/Demeanor/Rapport: Engaged Affect (typically observed): Appropriate Orientation: : Oriented to Self, Oriented to Place, Oriented to  Time, Oriented to Situation Alcohol / Substance Use: Not Applicable Psych Involvement: No (comment)  Admission diagnosis:  SIRS (systemic inflammatory response syndrome) (HCC) [R65.10] Acute deep vein thrombosis (DVT) of other vein of lower extremity, unspecified laterality (HCC) [I82.409] Patient Active Problem List   Diagnosis Date Noted   SIRS (systemic inflammatory response syndrome) (HCC) 07/28/2024   Lactic acidosis 07/28/2024   Syncope 07/28/2024   Nausea and vomiting 07/28/2024   Renal insufficiency 07/28/2024   Protein-calorie malnutrition, severe 06/07/2024   Acquired hypothyroidism 06/07/2024   Weakness 06/06/2024   Pressure injury of skin 06/06/2024   History of pulmonary embolism - saddle PE  in Jan 2014. on Xarelto  06/06/2024   Chronic anticoagulation - for history of saddle PE in 2014 06/06/2024   PAF (paroxysmal atrial fibrillation) (HCC) 11/19/2023   Transient hypotension 11/19/2023   Urinary tract infection due to ESBL Klebsiella 08/13/2020    Adjustment disorder with anxious mood    GERD (gastroesophageal reflux disease) 01/05/2013   PCP:  Kip Righter, MD Pharmacy:   [facility] [phone] [address], [place] - [address] [place] [address] Phone: [phone] Fax: [phone]     Social Drivers of Health (SDOH) Social History: SDOH Screenings   Food Insecurity: No Food Insecurity (12/28/2024)  Housing: Low Risk (12/28/2024)  Transportation Needs: No Transportation Needs (12/28/2024)  Utilities: Not At Risk (12/28/2024)  Social Connections: Socially Integrated (12/28/2024)  Tobacco Use: Low Risk (12/27/2024)   SDOH Interventions:     Readmission Risk Interventions    04/14/2024    2:39 PM  Readmission Risk Prevention Plan  Post Dischage Appt Complete  Medication Screening Complete  Transportation Screening Complete

## 2024-12-28 NOTE — Care Management CC44 (Signed)
"         Condition Code 44 Documentation Completed  Patient Details  Name: GETHSEMANE FISCHLER MRN: [id number] Date of Birth: [birth year]   Condition Code 44 given:  Yes Patient signature on Condition Code 44 notice:  Yes Documentation of 2 MD's agreement:  Yes Code 44 added to claim:  Yes    Stephane Powell Jansky, RN 12/28/2024, 1:12 PM  "

## 2025-01-01 LAB — CULTURE, BLOOD (ROUTINE X 2)
Culture: NO GROWTH
Culture: NO GROWTH

## 2025-03-29 ENCOUNTER — Ambulatory Visit: Admitting: Cardiology
